# Patient Record
Sex: Female | Born: 1945 | Race: Black or African American | Hispanic: No | Marital: Married | Smoking: Current some day smoker
Health system: Southern US, Community
[De-identification: ages and names within clinical notes are randomized; demographics above are authoritative.]

## PROBLEM LIST (undated history)

## (undated) DIAGNOSIS — I219 Acute myocardial infarction, unspecified: Secondary | ICD-10-CM

## (undated) DIAGNOSIS — M1990 Unspecified osteoarthritis, unspecified site: Secondary | ICD-10-CM

## (undated) DIAGNOSIS — E119 Type 2 diabetes mellitus without complications: Secondary | ICD-10-CM

## (undated) DIAGNOSIS — E7800 Pure hypercholesterolemia, unspecified: Secondary | ICD-10-CM

## (undated) DIAGNOSIS — Z9289 Personal history of other medical treatment: Secondary | ICD-10-CM

## (undated) DIAGNOSIS — H269 Unspecified cataract: Secondary | ICD-10-CM

## (undated) DIAGNOSIS — I499 Cardiac arrhythmia, unspecified: Secondary | ICD-10-CM

## (undated) DIAGNOSIS — E559 Vitamin D deficiency, unspecified: Secondary | ICD-10-CM

## (undated) DIAGNOSIS — I2510 Atherosclerotic heart disease of native coronary artery without angina pectoris: Secondary | ICD-10-CM

## (undated) DIAGNOSIS — I480 Paroxysmal atrial fibrillation: Secondary | ICD-10-CM

## (undated) DIAGNOSIS — I639 Cerebral infarction, unspecified: Secondary | ICD-10-CM

## (undated) DIAGNOSIS — I10 Essential (primary) hypertension: Secondary | ICD-10-CM

## (undated) HISTORY — PX: MULTIPLE TOOTH EXTRACTIONS: SHX2053

## (undated) HISTORY — DX: Vitamin D deficiency, unspecified: E55.9

## (undated) HISTORY — DX: Unspecified osteoarthritis, unspecified site: M19.90

## (undated) HISTORY — PX: CATARACT EXTRACTION, BILATERAL: SHX1313

## (undated) HISTORY — DX: Atherosclerotic heart disease of native coronary artery without angina pectoris: I25.10

## (undated) HISTORY — PX: COLONOSCOPY: SHX174

## (undated) HISTORY — PX: EYE SURGERY: SHX253

## (undated) HISTORY — DX: Unspecified cataract: H26.9

## (undated) HISTORY — PX: HAMMER TOE SURGERY: SHX385

## (undated) HISTORY — PX: ABDOMINAL HYSTERECTOMY: SHX81

## (undated) HISTORY — PX: CHOLECYSTECTOMY: SHX55

## (undated) HISTORY — DX: Paroxysmal atrial fibrillation: I48.0

## (undated) HISTORY — DX: Personal history of other medical treatment: Z92.89

---

## 1996-04-15 DIAGNOSIS — I639 Cerebral infarction, unspecified: Secondary | ICD-10-CM

## 1996-04-15 HISTORY — DX: Cerebral infarction, unspecified: I63.9

## 1997-08-05 ENCOUNTER — Ambulatory Visit (HOSPITAL_COMMUNITY): Admission: RE | Admit: 1997-08-05 | Discharge: 1997-08-05 | Payer: Self-pay | Admitting: Neurology

## 1997-09-08 ENCOUNTER — Ambulatory Visit (HOSPITAL_COMMUNITY): Admission: RE | Admit: 1997-09-08 | Discharge: 1997-09-08 | Payer: Self-pay | Admitting: Neurology

## 1998-07-31 ENCOUNTER — Other Ambulatory Visit: Admission: RE | Admit: 1998-07-31 | Discharge: 1998-07-31 | Payer: Self-pay | Admitting: Obstetrics and Gynecology

## 1999-09-28 ENCOUNTER — Other Ambulatory Visit: Admission: RE | Admit: 1999-09-28 | Discharge: 1999-09-28 | Payer: Self-pay | Admitting: Obstetrics and Gynecology

## 1999-11-14 DIAGNOSIS — I2510 Atherosclerotic heart disease of native coronary artery without angina pectoris: Secondary | ICD-10-CM

## 1999-11-14 HISTORY — PX: CORONARY ANGIOPLASTY WITH STENT PLACEMENT: SHX49

## 1999-11-14 HISTORY — DX: Atherosclerotic heart disease of native coronary artery without angina pectoris: I25.10

## 1999-11-29 ENCOUNTER — Encounter: Payer: Self-pay | Admitting: Internal Medicine

## 1999-11-29 ENCOUNTER — Inpatient Hospital Stay (HOSPITAL_COMMUNITY): Admission: EM | Admit: 1999-11-29 | Discharge: 1999-12-03 | Payer: Self-pay | Admitting: *Deleted

## 1999-11-30 ENCOUNTER — Encounter: Payer: Self-pay | Admitting: Internal Medicine

## 2000-07-28 ENCOUNTER — Inpatient Hospital Stay (HOSPITAL_COMMUNITY): Admission: EM | Admit: 2000-07-28 | Discharge: 2000-07-29 | Payer: Self-pay | Admitting: Emergency Medicine

## 2000-07-28 ENCOUNTER — Encounter: Payer: Self-pay | Admitting: Emergency Medicine

## 2000-07-28 ENCOUNTER — Encounter: Payer: Self-pay | Admitting: Cardiovascular Disease

## 2004-08-13 HISTORY — PX: CORONARY ANGIOGRAM: SHX5786

## 2004-08-20 ENCOUNTER — Inpatient Hospital Stay (HOSPITAL_COMMUNITY): Admission: EM | Admit: 2004-08-20 | Discharge: 2004-08-23 | Payer: Self-pay | Admitting: Emergency Medicine

## 2004-08-20 IMAGING — CR DG CHEST 1V PORT
1 series · 1 of 1 positions shown · non-contrast
Comparison: None.

CLINICAL DATA: Chest pain and dyspnea.
 PORTABLE CHEST -1 VIEW, [DATE], [IW] HOURS:

[view not recorded]
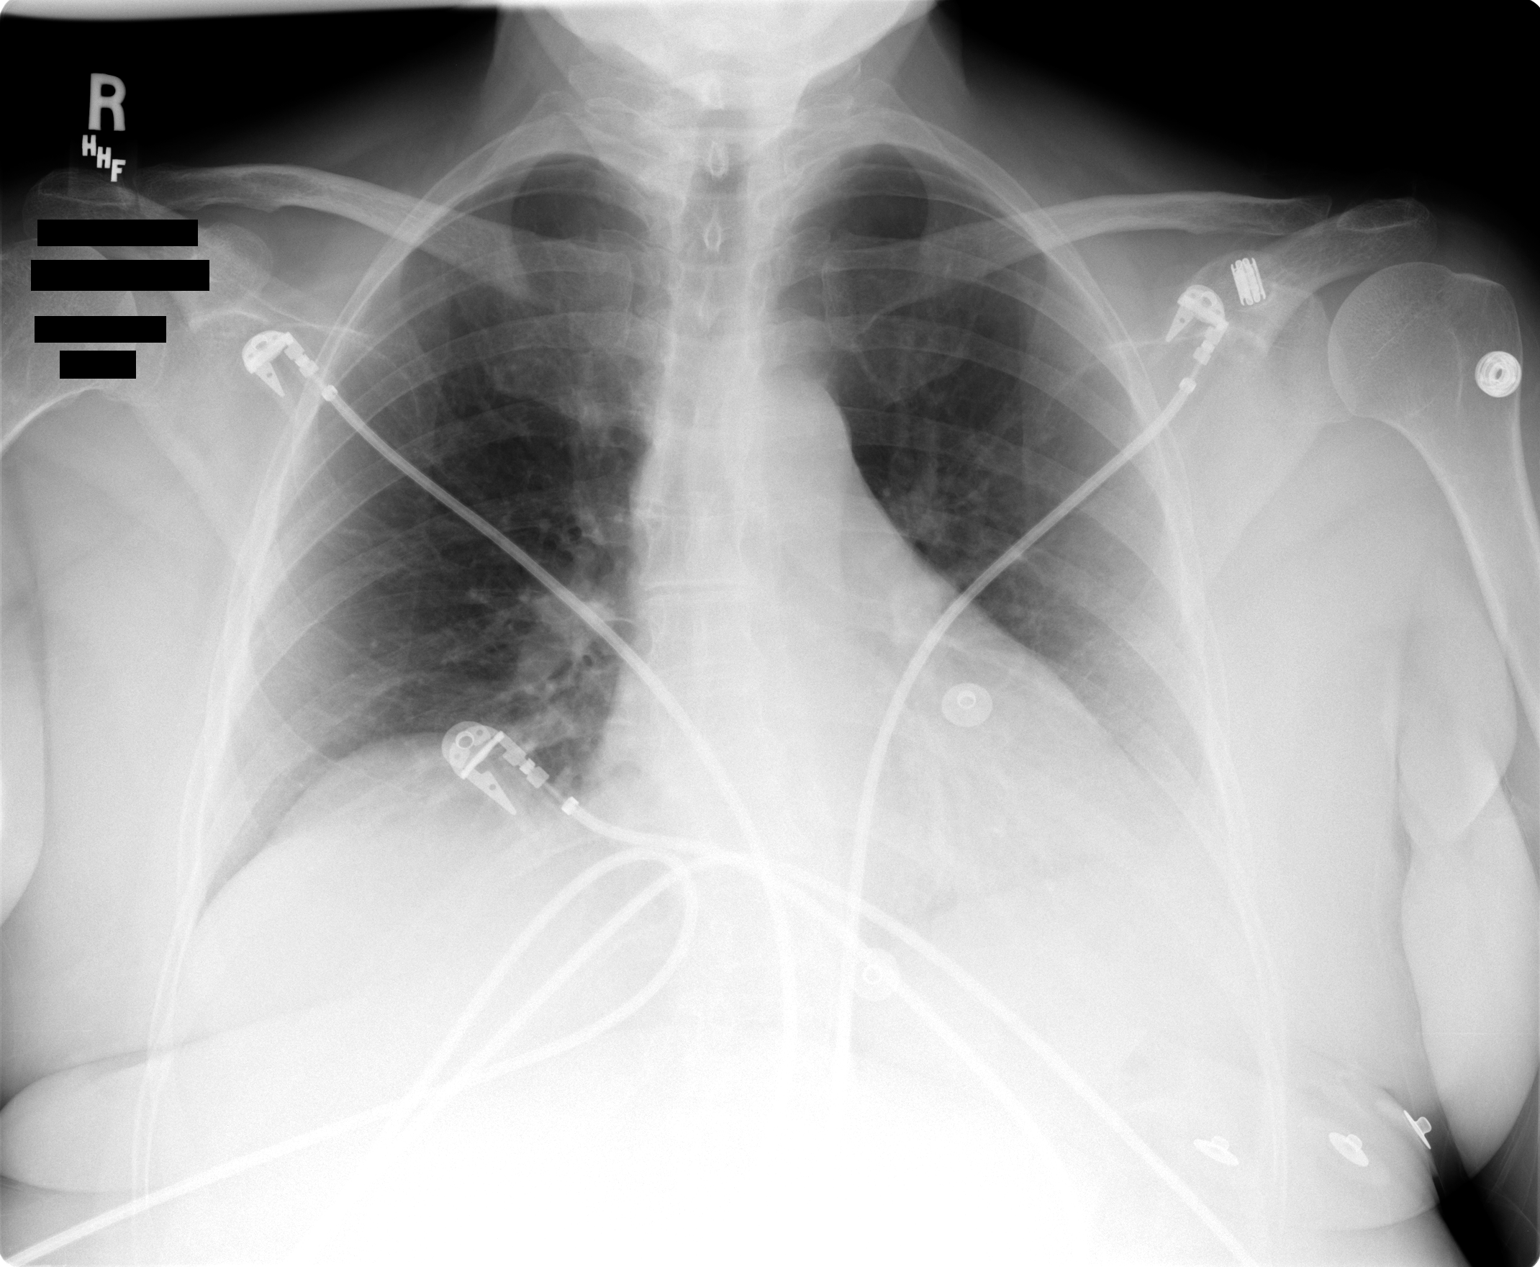

[1 of 1 positions shown; findings below may reference images not displayed]

The heart size is at the upper limits of normal for portable technique.  The lungs are clear.  There is no pleural effusion or pneumothorax.  There is a mild biconvex thoracolumbar scoliosis.
IMPRESSION: No active cardiopulmonary process demonstrated.

## 2004-08-20 IMAGING — US US ABDOMEN COMPLETE
1 series · 14 of 25 positions shown · non-contrast
Comparison: none

CLINICAL DATA: Chest discomfort.  
 ULTRASOUND ABDOMEN COMPLETE: 
 Multiple longitudinal and transverse images are obtained.  Gallstones are present with sludge within the gallbladder.  There is slight gallbladder wall thickening measuring 3.3 mm. The common bile duct was normal in size measuring 4.3 mm.  Liver is normal in size and echotexture without masses or biliary ductal dilatation.  The IVC is normal.  The pancreas is normal in the body and head region and the tail was obscured by gas however.   Spleen is normal to slightly small with a length of 7.8 cm.  The kidneys are normal in size and echotexture without masses, hydronephrosis, or calcifications.  The right kidney measures 10.1 cm, the left kidney measures 10.0 cm.  The abdominal aorta showed a normal caliber without aneurysmal dilatation measuring 2.1 cm.   There was no sonographic Murphy?s sign present.

[Series 1: unknown · 0.33mm/px · 14 of 55 slices shown]
[im 1/55]
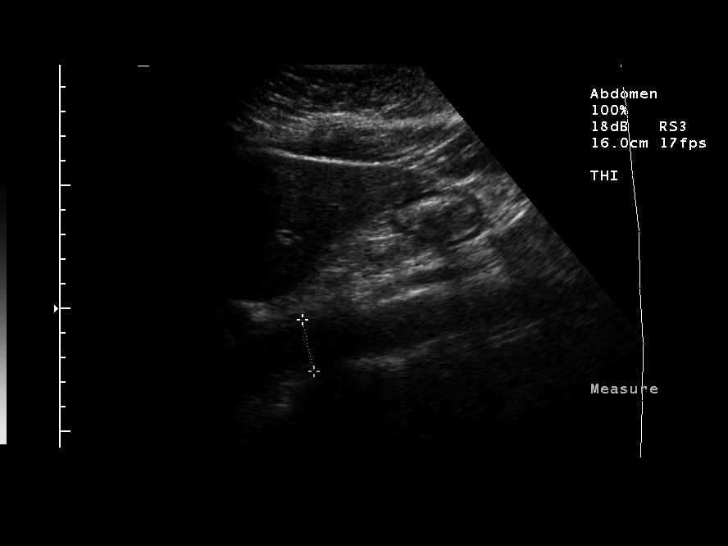
[im 5/55]
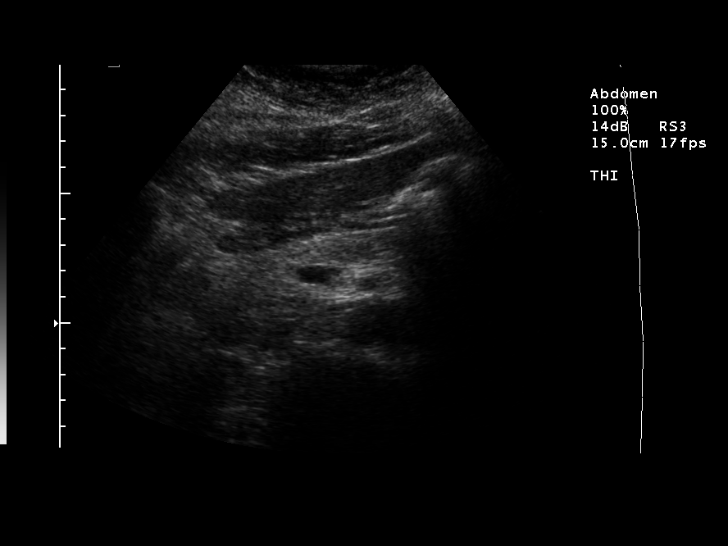
[im 10/55]
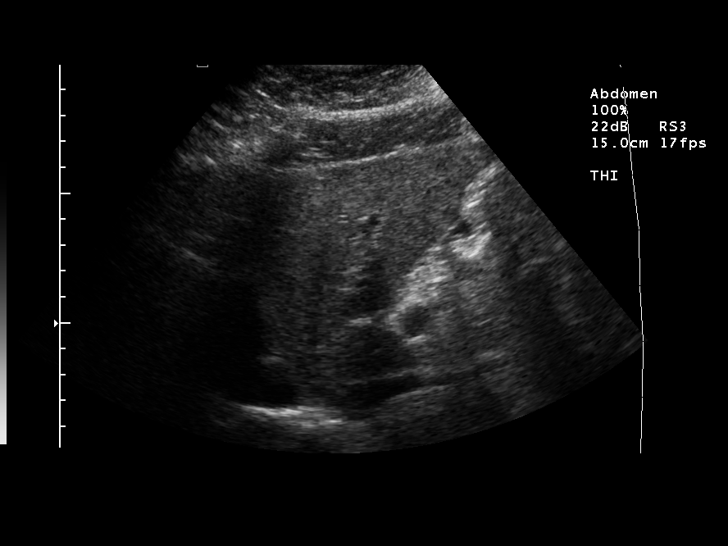
[im 14/55]
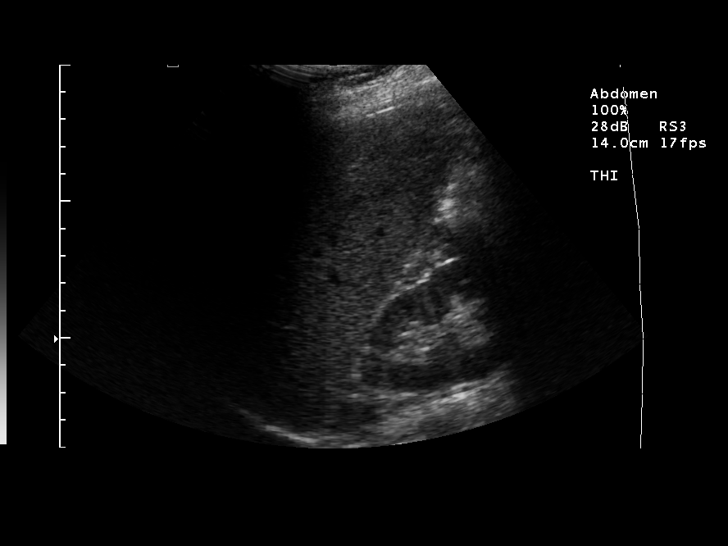
[im 19/55]
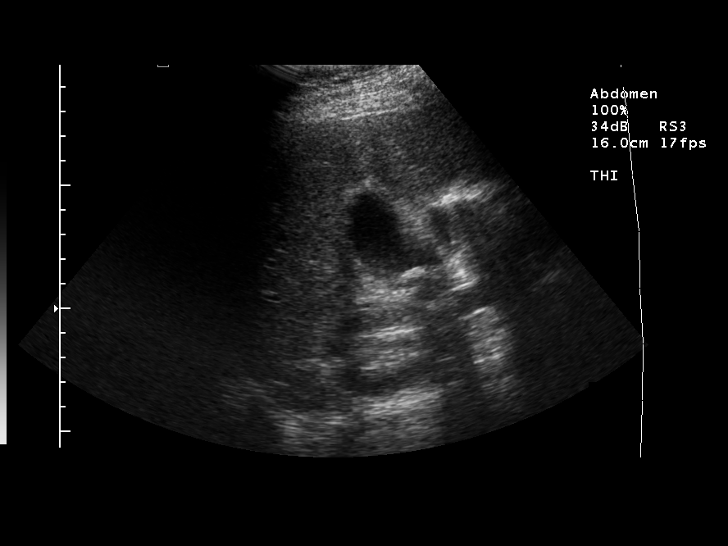
[im 21/55]
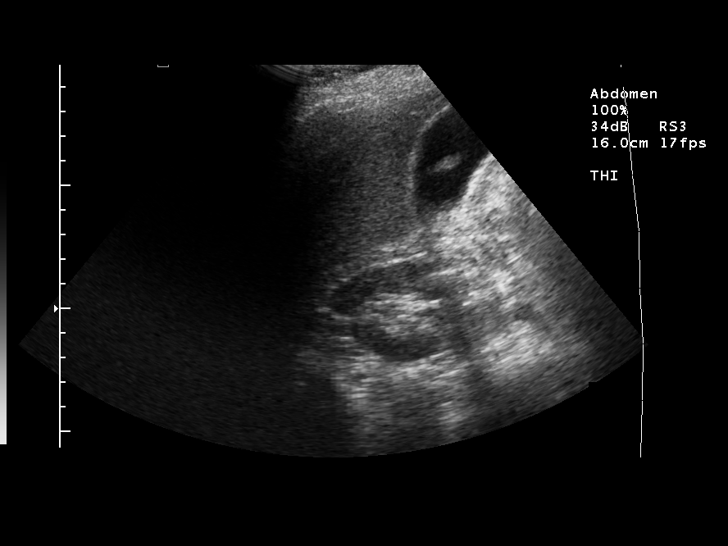
[im 25/55]
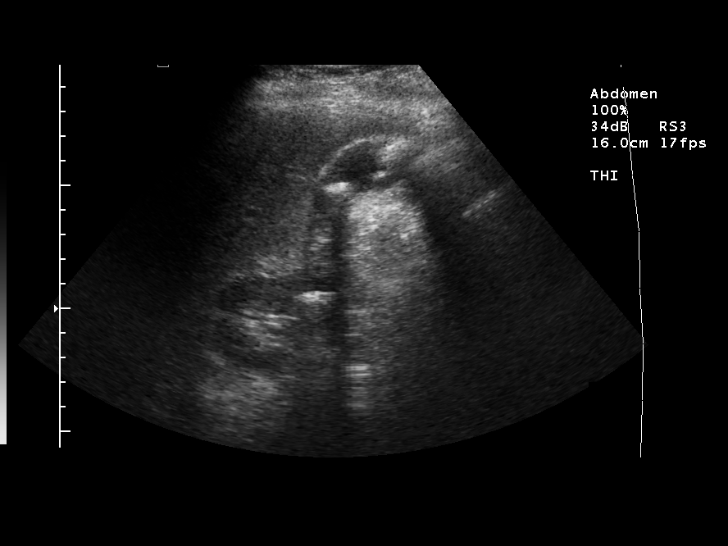
[im 30/55]
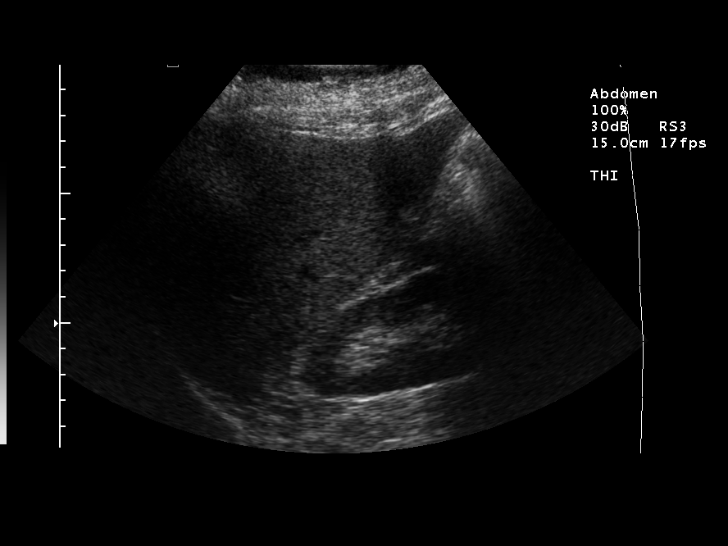
[im 34/55]
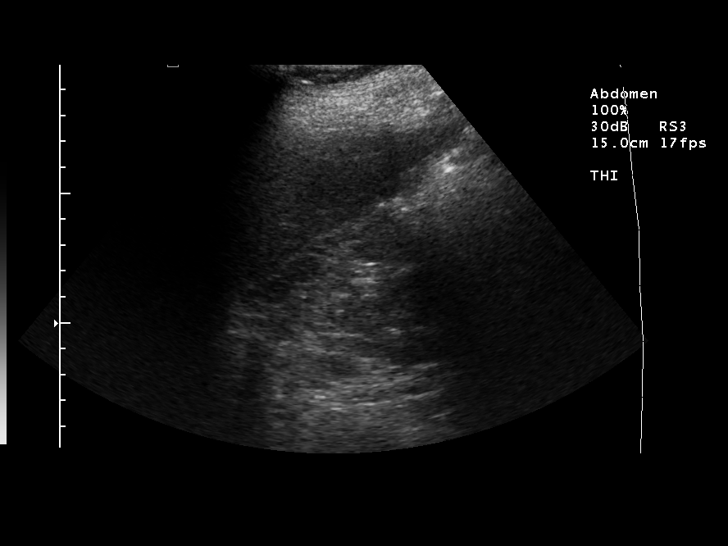
[im 37/55]
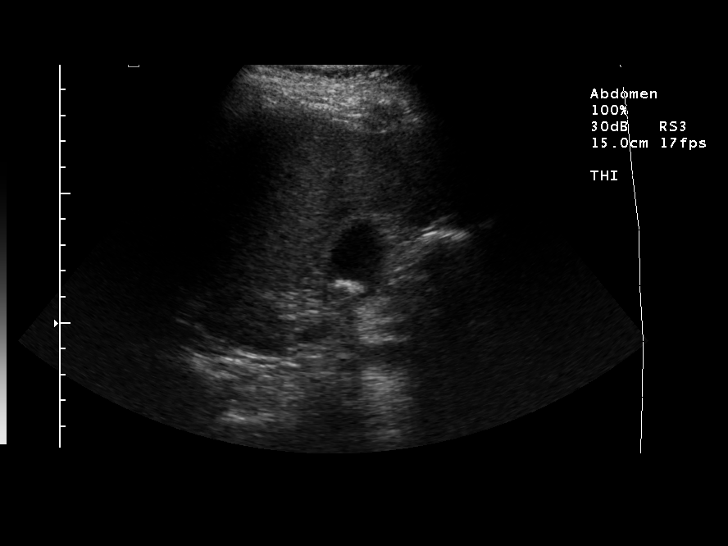
[im 41/55]
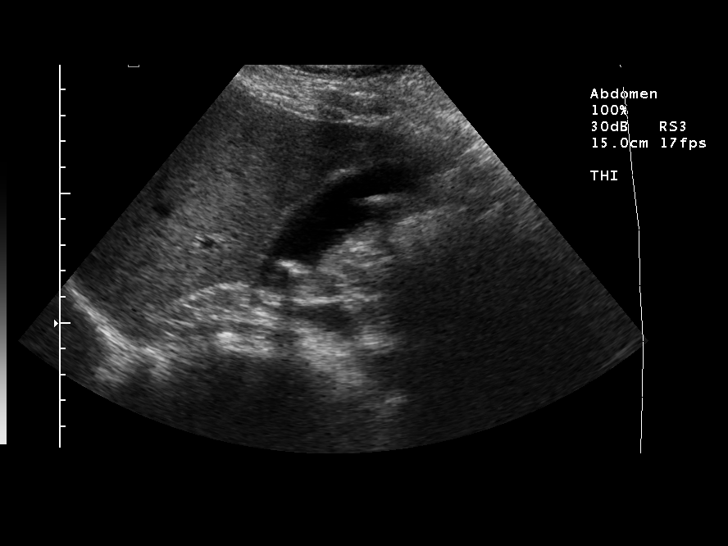
[im 46/55]
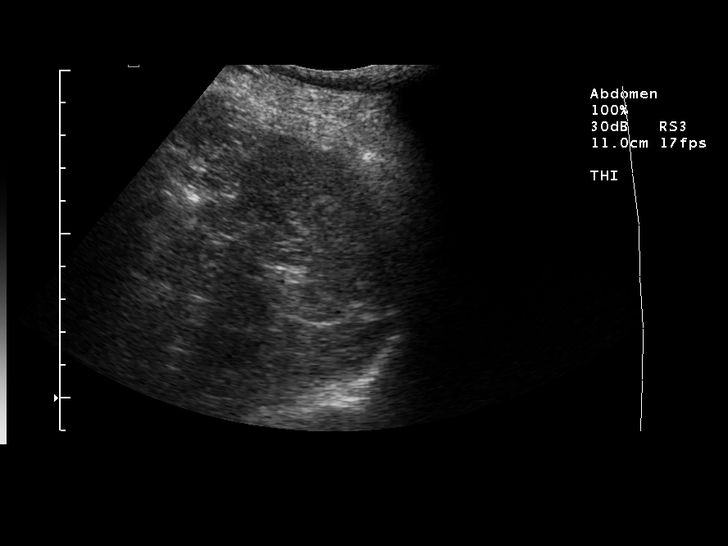
[im 50/55]
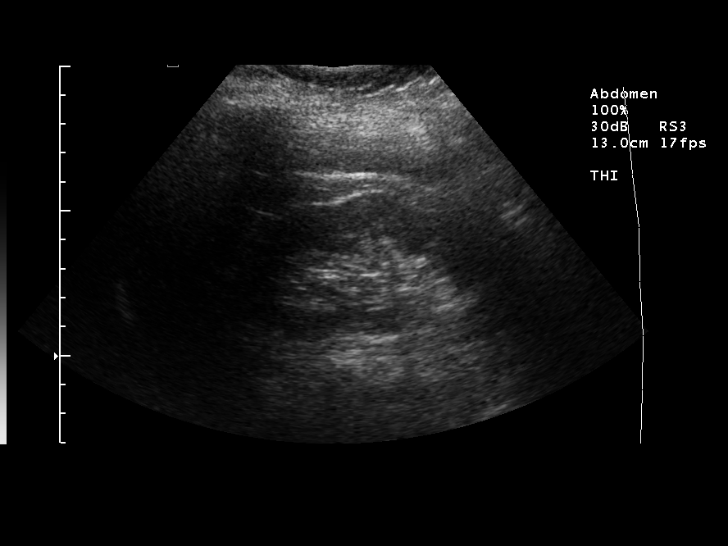
[im 55/55]
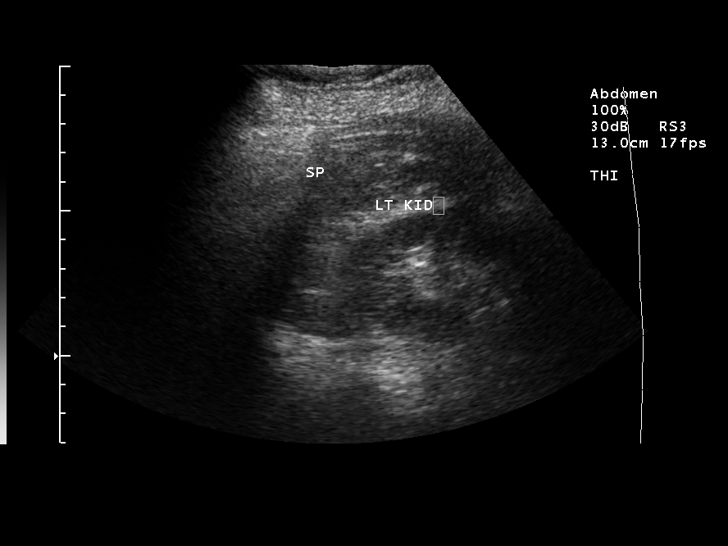

[14 of 25 positions shown; findings below may reference images not displayed]

IMPRESSION: 1.   Abnormal gallbladder containing stones and sludge. 
 2.   Mild gallbladder wall thickening. 
 3.  No biliary ductal dilatation or significant right upper quadrant tenderness.

## 2004-08-22 ENCOUNTER — Encounter (INDEPENDENT_AMBULATORY_CARE_PROVIDER_SITE_OTHER): Payer: Self-pay | Admitting: Specialist

## 2004-08-22 IMAGING — RF DG CHOLANGIOGRAM OPERATIVE
1 series · 4 of 4 positions shown · non-contrast
Comparison: none

CLINICAL DATA: Gallstones.
 OPERATIVE CHOLANGIOGRAM:
 Multiple views for an operative cholangiogram performed through the cystic duct reveal good opacification of the common hepatic duct and common bile duct.  There are no filling defects or retained stones.  There is good excretion into the duodenum.

[Series 1: run · 4 of 69 frames shown]
[frame 11/69]
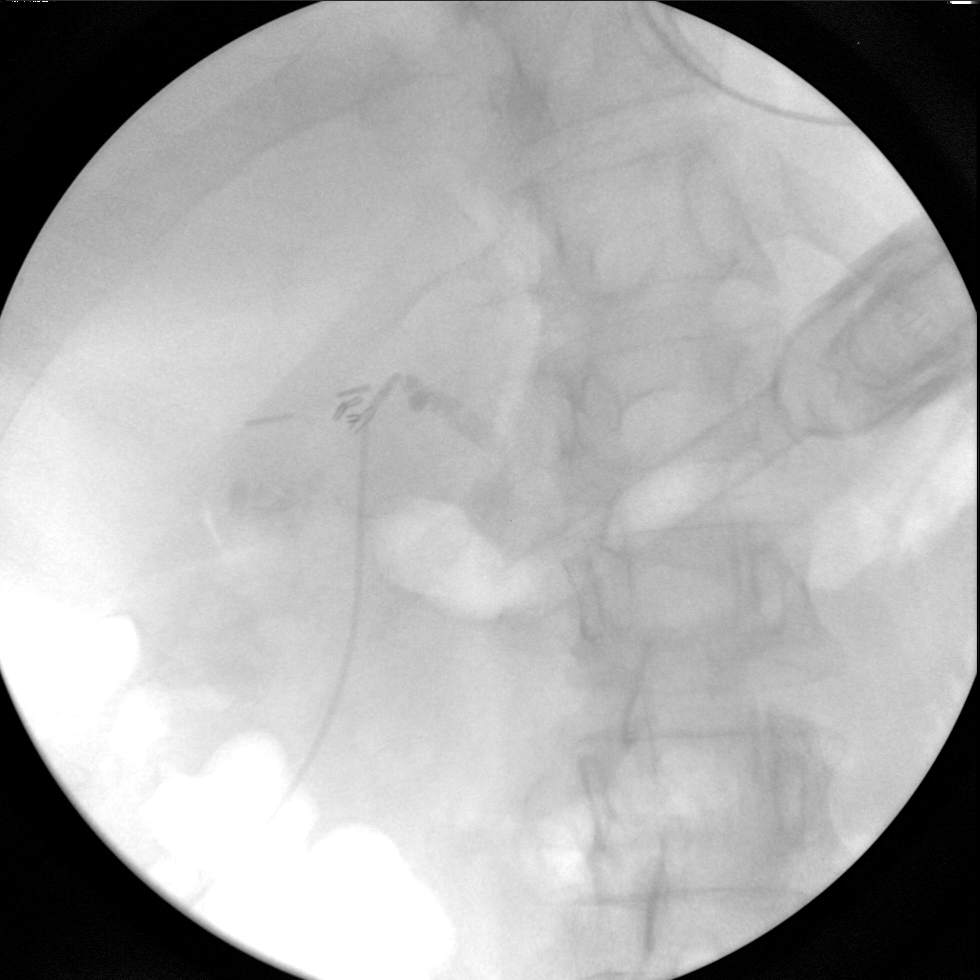
[frame 35/69]
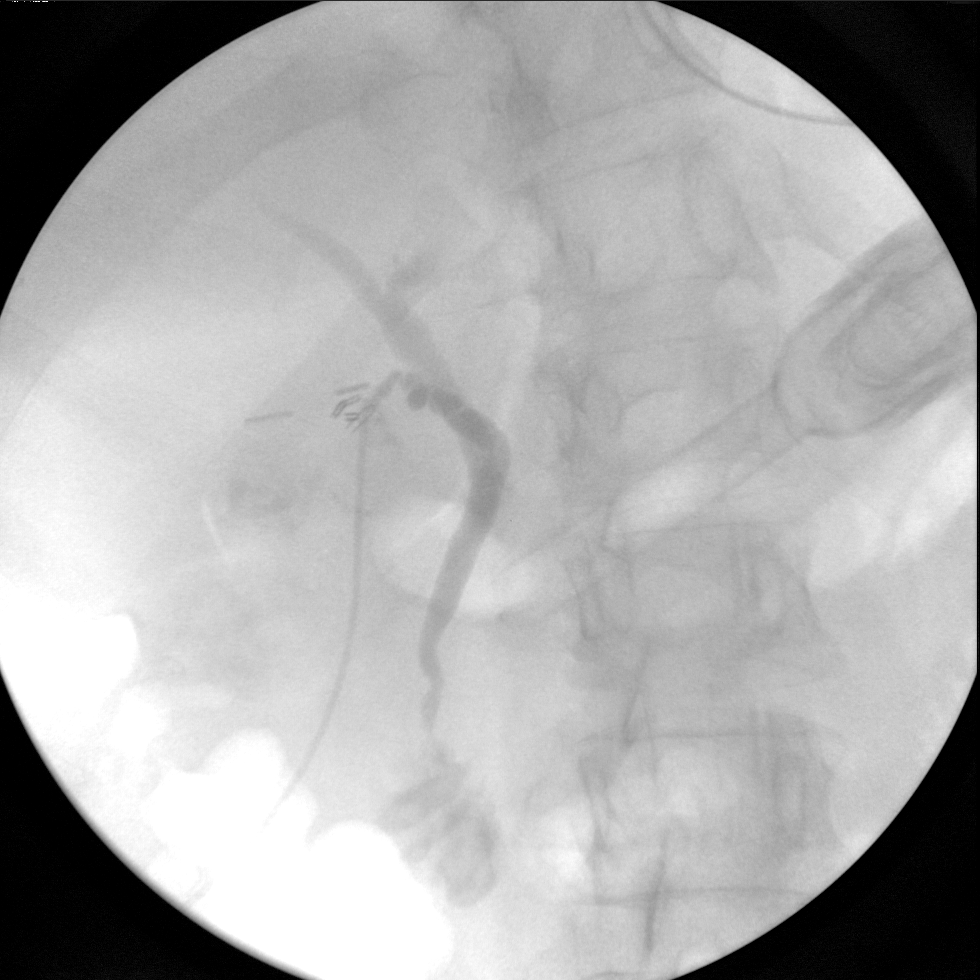
[frame 59/69]
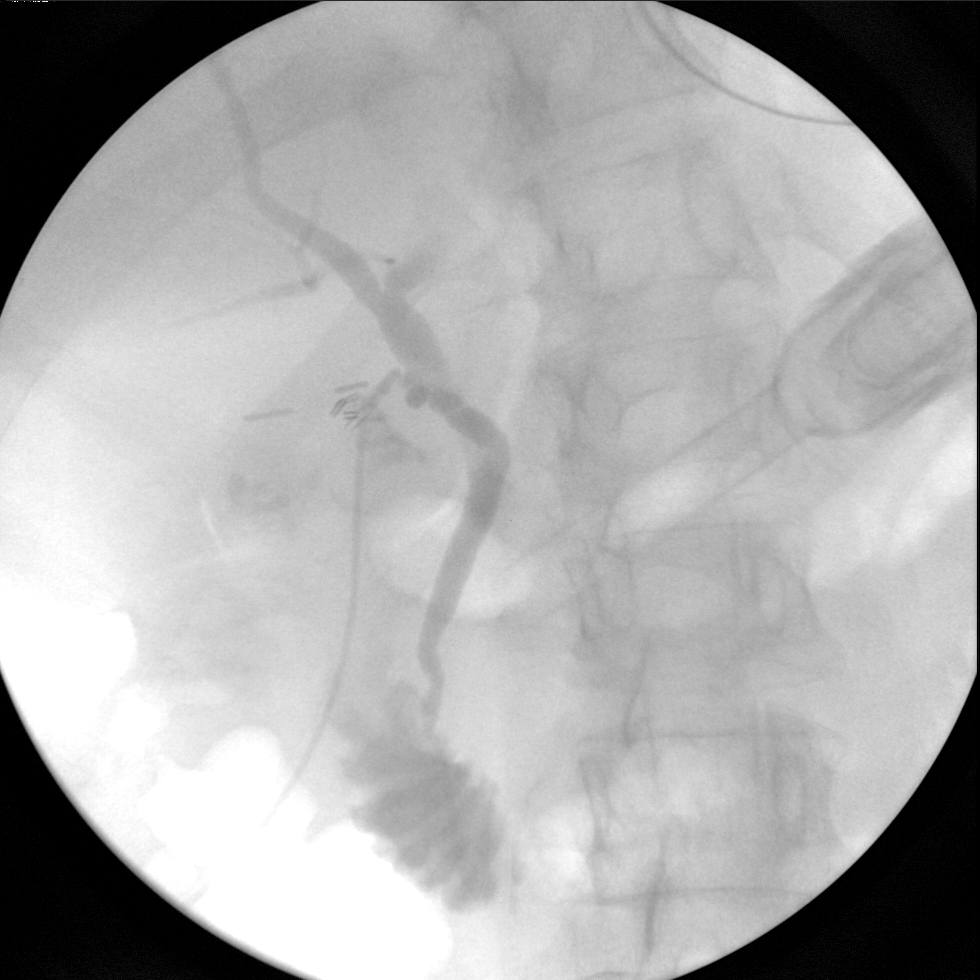
[frame 69/69]
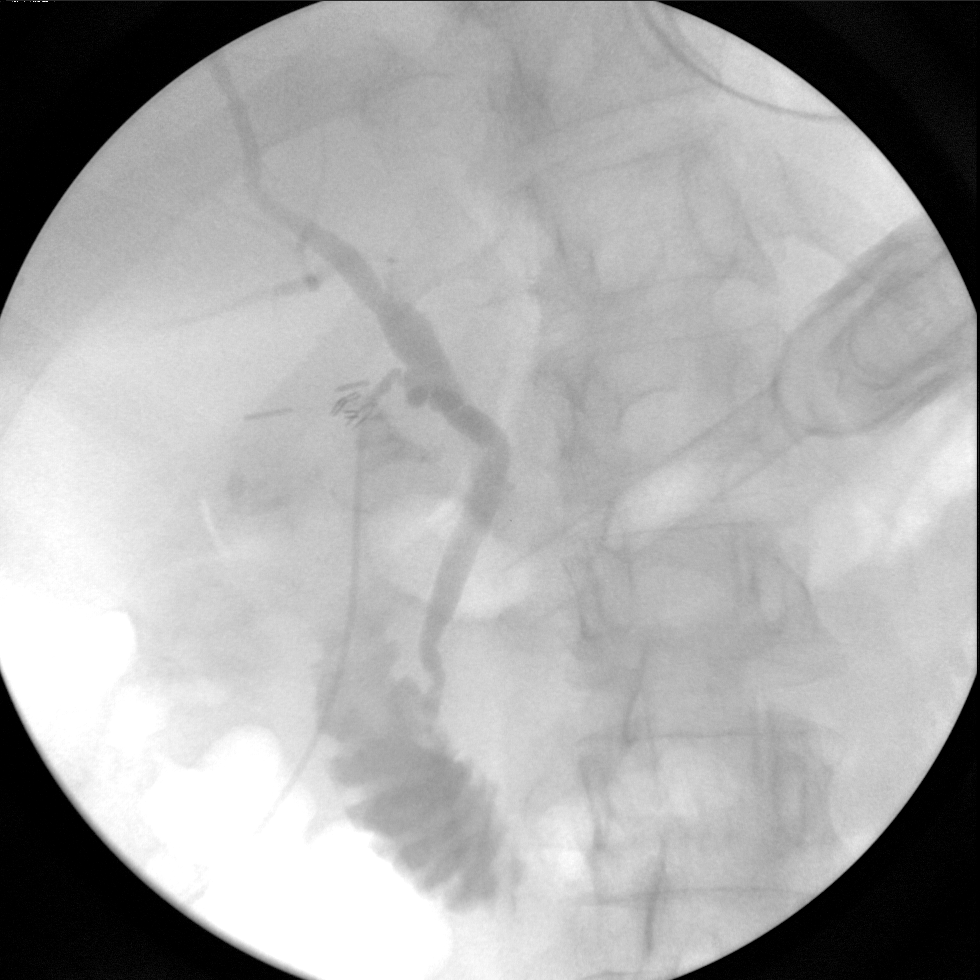

[4 of 4 positions shown; findings below may reference images not displayed]

IMPRESSION: Negative operative cholangiogram.

## 2006-12-08 ENCOUNTER — Ambulatory Visit: Payer: Self-pay | Admitting: Gastroenterology

## 2006-12-22 ENCOUNTER — Ambulatory Visit: Payer: Self-pay | Admitting: Gastroenterology

## 2010-08-31 NOTE — Consult Note (Signed)
Debbie Debbie [name], Debbie [name]                  ACCOUNT NO.:  [id number]   MEDICAL RECORD NO.:  [id number]          PATIENT TYPE:  INP   LOCATION:  2011                         FACILITY:  [facility]   PHYSICIAN:  Sharlet Salina T. Hoxworth, M.D.DATE OF BIRTH:  1946/03/08   DATE OF CONSULTATION:  08/21/2004  DATE OF DISCHARGE:                                   CONSULTATION   REASON FOR CONSULTATION:  Cholecystitis.   HISTORY OF PRESENT ILLNESS:  Debbie Debbie [name] is a 65 year old female who developed  the acute onset of epigastric abdominal pain not associated with nausea and  vomiting on Aug 19, 2004.  She stated that the pain mimicked her previous MI  pain and she proceeded to the [facility] emergency room  on Aug 20, 2004.  She was  admitted by cardiology and ultimately received cardiac catheterization on  Aug 21, 2004, which showed no restenosis.  As part of her workup, she  received a gallbladder ultrasound and this did reveal cholelithiasis with  gallbladder wall thickening.  We are being consulted for a cholecystectomy.   REVIEW OF SYMPTOMS:  As above.  She denies any shortness of breath.  All  other systems are negative.   ALLERGIES:  No known drug allergies.   MEDICATIONS:  Aspirin, Norvasc 10 mg daily, Lipitor 80 mg daily,  hydrochlorothiazide 25 mg daily, Protonix 40 mg, Altace 10 mg daily.   PAST MEDICAL HISTORY:  Coronary artery disease, stroke, hyperlipidemia.   PAST SURGICAL HISTORY:  Tubal ligation.   FAMILY HISTORY:  Mother deceased secondary to ovarian cancer.  Sister with a  history of CHF.   SOCIAL HISTORY:  She smokes less than half pack per day. No alcohol or  illicit drug use.  She lives in [place] with her husband.  She has three  children.   PHYSICAL EXAMINATION:  VITAL SIGNS:  Blood pressure 113/49, pulse 54, respirations 20, temperature  97.9, oxygen saturation 98% on room air.  GENERAL:  In no acute distress.  HEENT:  Grossly normal.  Sclerae clear, conjunctivae normal, nares  without  discharge.  NECK:  No carotid or subclavian bruits, no JVD, no thyromegaly.  HEART:  Regular rate and rhythm, no murmurs, gallops, and rubs.  CHEST:  Clear to auscultation bilaterally, no wheezing, no rhonchi.  SKIN:  Warm and dry.  ABDOMEN:  Good bowel sounds, soft, nontender, nondistended, no  hepatosplenomegaly.  NEUROLOGICAL:  Cranial nerves 2-12 grossly intact.  MUSCULOSKELETAL:  No joint effusions, no CVA tenderness.  EXTREMITIES:  No peripheral edema, warm.   ASSESSMENT AND PLAN:  1.  Cholecystitis for laparoscopic cholecystectomy with interoperative      cholangiogram on Aug 22, 2004, by Dr. Glenna Fellows.  2.  Coronary artery disease, history of myocardial infarction.  3.  History of remote stroke ten years ago.  4.  Hyperlipidemia, treated.  5.  Status post tubal ligation.   Thank you for the consult.      LB/MEDQ  D:  08/21/2004  T:  08/21/2004  Job:  [id number]   cc:   Nicki Guadalajara, M.D.  [phone],  [address]  [place], [place] [address]  Fax: [phone]   Llana Aliment. Malon Kindle., M.D.  [address]  [place]  [place] [address]  Fax: [phone]   Nanetta Batty, M.D.  Fax: [phone]   Jonita Albee, M.D.  [facility]  [address]  [place]  [place] [address]  Fax: [phone]

## 2010-08-31 NOTE — Discharge Summary (Signed)
[place]  Patient:    Debbie [name], Debbie [name]                         MRN: [id number] Adm. Date:  [id number] Disc. Date: [id number] Attending:  Madaline Guthrie Dictator:   Blanch Media, M.D.                           Discharge Summary  PRIMARY CARE: Dr. Perrin Maltese.  CONSULTATIONS:  [facility].  DISCHARGE DIAGNOSES: 1. Status post acute inferior myocardial infarction. 2. Status post cardiac catheterization on November 30, 1999 with a complex    percutaneous transluminal coronary angioplasty/stent of the right coronary    artery. 3. History of cerebrovascular accident. 4. Hypertension. 5. Hyperlipidemia.  DISCHARGE MEDICATIONS: 1. Toprol XL 100 mg 2 p.o. b.i.d. 2. Plavix 75 mg 1 p.o. q.d. with meal. 3. Altace 2.5 mg 1 p.o. q.d. 4. Aspirin 325 mg 1 p.o. q.d. 5. Zocor 20 mg 1 p.o. q.d.  PROCEDURES:  Cardiac catheterization/complex PTCA with stent placement x 3 in the RCA.  This was performed via the right femoral artery.  ADMITTING HISTORY AND PHYSICAL:  This is a 65 year old African-American female with no significant past cardiac history.  She developed chest tightness while at work on the morning of August 16 at 10 a.m.  She was sitting at the time with no strenuous exercise.  The patient described her discomfort as a crushing pain the in anterior chest.  There was no associated shortness of breath nor radiation into the back, arm, or jaw.  The patient did have nausea and diaphoresis and vomiting x 1.  The pain did not resolve, so the patient came to the ER at 11:20.  She received 2 sublingual nitroglycerin with partial relief of the pain.  On our evaluation, she still had some chest discomfort and was started on a nitroglycerin and heparin drip.  About 45 minutes after the nitroglycerin drip was started, she had complete resolution of her chest pain.  Physical examination showed vital signs with blood pressure 150/62, pulse 58, respiratory  rate 20, O2 saturation 100% on room air.  The rest of the physical exam was noncontributory.  LABORATORY DATA:  EKG showed a heart rate of 66 with sinus bradycardic rhythm.  Admitting labs showed a hemoglobin 12.8, hematocrit 36.2, white blood cell count 11.1.  PT 12.8, INR 1.0, PTT 22.  Initial cardiac enzymes: CK total 115, CK-MB 1.9, troponin I less than 0.03.  HOSPITAL COURSE:  The patient was admitted to telemetry to rule out an acute MI.  The nitroglycerin and heparin drips were continued.  We obtained serial EKGs and CK-MBs.  A CT of the chest was obtained to rule out a pulmonary embolus as we were worried about this as the cause of the chest pain.  She had been on Coumadin following her CVA but was subtherapeutic.  Spiral CT revealed no abnormalities.  She was also started on a beta blocker, metoprolol, a proton pump inhibitor, Protonix.  On hospital day #1, her second set of cardiac enzymes were positive.  Her CK total had increased to 596, her CK-MB to 103.2, and her troponin I to 1.17. Her EKG showed sinus bradycardia continuing.  There were T wave inversions in leads II, III, V4, V5, and V6.  There were Q waves in leads III and aVF. Cardiology was consulted and saw her that day.  Their plan  was to continue with IV heparinization, begin Aggrastat, continue nitroglycerin drip, and they added Plavix p.o. and checked fasting lipids.  She was taken to the catheterization lab on August 17.  She had cardiac catheterization with a complex PTCA with three stent placements in the RCA.  On hospital day #2, she had no further chest pain and no groin pain from the cardiac catheterization.  The plan was to hospitalize her until hospital day #4 after her MI.  Her potassium was found to be 2.9, and she received oral replacement.  The metoprolol was discontinued, and she was switched to metoprolol XL 100 p.o. q.d.  Cardiac rehabilitation was consulted.  On hospital day #3, potassium  continued to be low at 3.1, and oral replacement was continued.  The patient was to discharge her the following morning.  On hospital day #4, the patient continued to have no chest pain even on exertion.  The plan was to continue the metoprolol, ACE inhibitor, and Plavix. On hospital day #4, potassium was corrected, and plans for discharge were finalized.  DISPOSITION:  The patient was discharged to home.  She is to follow up with her primary care physician, Dr. Perrin Maltese, in two weeks.  She is to have no strenuous activities for two weeks but may started to increase her activities between weeks two and four post discharge.  She is to be off work for up to four weeks with a minimum of two weeks.  A low-cholesterol, low-fat diet was recommended.  Cardiac rehabilitation will follow up with the patient as an outpatient. DD:  12/03/99 TD:  12/04/99 Job: [id number]

## 2010-08-31 NOTE — H&P (Signed)
Debbie [name], OLTMANN                  ACCOUNT NO.:  [id number]   MEDICAL RECORD NO.:  [id number]          PATIENT TYPE:  INP   LOCATION:  1829                         FACILITY:  [facility]   PHYSICIAN:  Nanetta Batty, M.D.   DATE OF BIRTH:  08-25-1945   DATE OF ADMISSION:  08/20/2004  DATE OF DISCHARGE:                                HISTORY & PHYSICAL   CHIEF COMPLAINT:  Chest pain.   HISTORY OF PRESENT ILLNESS:  Mrs. Gaut is a 65 year old female followed by  Dr. Tresa Endo with a history of coronary artery disease.  She had a three-site  RCA stent placement in August 2001 in the setting of a DMI.  She had some  residual disease in the circumflex and LAD at that time.  She had a relook  catheterization in April 2002, that showed patent RCA stage and progression  of the residual coronary disease in the LAD and circumflex.  She had  Cardiolite study in March of 2005 that was negative for ischemia.  She had  normal left ventricular function then.  Yesterday afternoon she sitting on a  couch and developed some epigastric discomfort which she described as  indigestion.  She had no associated nausea, vomiting, diaphoresis, or  radiation the her neck or arms.  Symptoms worsened about 1:30 a.m. and she  took two Pepto-Bismols without relief.  She did not take nitroglycerin.  She  awakened this morning and continued to have some epigastric discomfort and  called our office. She is seen now in the emergency room at [facility].  She is  currently pain-free after receiving four aspirins in the emergency room.  The symptoms she is having now are similar to her preangioplasty symptoms as  well as her precatheterization symptoms in April 2002.   PAST MEDICAL HISTORY:  1.  Unremarkable for diabetes.  2.  She does have treated hypertension.  3.  Hyperlipidemia.  4.  She has had no other major surgeries.   CURRENT MEDICATIONS:  1.  Toprol-XL 150 mg a day.  2.  Aspirin daily.  3.  Altace 10 mg a day.  4.   Caduet 10/80 daily.  5.  Hydrochlorothiazide 25 mg a day.   ALLERGIES:  No known drug allergies.   SOCIAL HISTORY:  She is married.  She has two children and three  grandchildren.  She is a Teaching laboratory technician at [employer].  She smokes less than  half a pack a day.  She does not exercise regularly.   FAMILY HISTORY:  Remarkable for coronary artery disease.  Apparently she has  a sister who died suddenly of an MI who had diabetes.   REVIEW OF SYSTEMS:  She denies any nausea and vomiting.  She has not had  unusual dyspnea on exertion.  She has not had palpitations or tachycardia.   PHYSICAL EXAMINATION:  VITAL SIGNS:  Blood pressure 104/54, pulse 48,  respirations 12.  GENERAL:  Well-developed, well-nourished African American female in no acute  distress.  HEENT:  Normocephalic.  Extraocular movements intact.  Sclerae nonicteric.  Lids and conjunctivae within normal limits.  NECK:  Without JVD and without bruit.  CHEST:  Clear to auscultation and percussion.  CARDIAC:  Regular rate and rhythm without obvious murmur, rub, or gallop.  Normal S1 and S2.  ABDOMEN:  Obese, nontender.  Bowel sounds are present.  EXTREMITIES:  Without edema.  Distal pulses are intact.  NEUROLOGIC:  Grossly intact.  She is awake, alert, oriented, cooperative.  Moves all extremities without __________.   LABORATORY DATA:  Her EKG shows sinus bradycardia without acute changes.  Sodium 138, potassium 3.6, BUN 12, creatinine 0.9.  White count 8.6,  hemoglobin 12.8, hematocrit 36.9, platelets 345, INR 0.9, troponins negative  x2.  Chest x-ray was negative for acute disease.   IMPRESSION:  1.  Chest and epigastric discomfort, rule out coronary artery disease      progression.  2.  Known coronary disease with right coronary artery stenting in August,      2001 in the setting of an myocardial infarction, similar symptoms at      that presentation.  3.  Treated hypertension.  4.  Dyslipidemia.  5.  Smoking.  6.   Family history of coronary artery disease.   PLAN:  The patient seen by Dr. Allyson Sabal and myself today in the emergency room.  She will be admitted to telemetry.  Rule out for an MI and start on IV  heparin and nitrates.  Will go ahead and get an gallbladder ultrasound.  She  will probably need restudy this admission.      LKK/MEDQ  D:  08/20/2004  T:  08/20/2004  Job:  [id number]

## 2010-08-31 NOTE — Cardiovascular Report (Signed)
[place]  Patient:    Debbie [name], Debbie [name]                         MRN: [id number] Proc. Date: 07/29/00 Adm. Date:  [id number] Disc. Date: [id number] Attending:  Virgina Evener CC:         Cardiac Catheterization Lab  Lennette Bihari, M.D.   Cardiac Catheterization  PROCEDURES PERFORMED: 1. Left selective coronary arteriography by Judkins technique. 2. Retrograde left heart catheterization. 3. Left ventricular angiography. 4. Right femoral Perclose arteriotomy.  CARDIOLOGIST:  Madaline Savage, M.D.  COMPLICATIONS:  None.  ENTRY SITE:  Right femoral.  DYE USED:  Omnipaque.  MEDICATIONS GIVEN:  Fentanyl 25 mg on two occasions for sedation.  PATIENT PROFILE:  The patient is a 65 year old African-American female who is seen by Dr. Daphene Jaeger for cardiology who entered the hospital several days ago with an episode of chest pain.  She has had previous coronary artery stenting to three sites in the right coronary artery on November 30, 1999.  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  Cardiac enzymes and EKGs have been negative for ischemia.  This cardiac catheterization is done electively on an inpatient basis.  RESULTS:  Left ventricular ejection fraction 66% calculated.  Left main coronary artery normal.  Left anterior descending coronary artery courses to cardiac apex.  The mid LAD contains a 50% fixed stenosis with a systolic bridge.  The distal vessel is free of any significant obstructive lesions.  The circumflex coronary artery gives rise to one bifurcating obtuse marginal branch which arises proximally in the vessel.  The remainder of the circumflex itself is nondominant and terminates as a small posterolateral branch.  No significant lesions are seen in either of the circumflex branches.  The right coronary artery contains radiopaque stents proximally in the mid portion and in the beginning of the distal portion of the vessel.   The vessel then bifurcates into a posterior descending and a posterolateral branch and is a dominant vessel.  All three stents are patent.  The vessel is 3.5 in diameter proximally and is 2.5 in diameter distally, and all stents appear to be patent.  The distal runoff is good.  FINAL DIAGNOSES: 1. Normal left ventricular systolic function. 2. Right coronary artery disease status post stenting with patent stents. 3. 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  PLAN:  Continued medical therapy. DD:  07/29/00 TD:  07/30/00 Job: [id number]

## 2010-08-31 NOTE — Op Note (Signed)
Debbie [name], Debbie [name]                  ACCOUNT NO.:  [id number]   MEDICAL RECORD NO.:  [id number]          PATIENT TYPE:  INP   LOCATION:  2011                         FACILITY:  [facility]   PHYSICIAN:  Sharlet Salina T. Hoxworth, M.D.DATE OF BIRTH:  May 20, 1945   DATE OF PROCEDURE:  08/22/2004  DATE OF DISCHARGE:                                 OPERATIVE REPORT   PREOPERATIVE DIAGNOSIS:  Cholelithiasis, cholecystitis.   POSTOPERATIVE DIAGNOSIS:  Cholelithiasis, cholecystitis.   SURGICAL PROCEDURES:  Laparoscopic cholecystectomy with  intraoperative  cholangiogram.   HISTORY OF PRESENT ILLNESS:  Debbie [name] is a 66 year old female with history  of coronary disease who presented with acute severe low substernal and  epigastric pain two days ago. She has had a cardiac cath and coronary  disease has been ruled out. Her pain is improved. Gallbladder ultrasound has  shown cholelithiasis and thickened gallbladder wall. She is felt to have  symptomatic cholelithiasis. Laparoscopic cholecystectomy with cholangiogram  has been recommended and accepted. The nature of the procedure, indications,  risks of bleeding, infection, bile leak and bile duct injury were discussed  and understood. No. The patient is now brought to the operating room for  this procedure.   DESCRIPTION OF PROCEDURE:  Patient brought to the operating room and placed  in the supine position on the operating the table and general orotracheal  anesthesia induced. She had received broad spectrum preoperative  antibiotics. PAS were placed. The abdomen was widely sterilely prepped and  draped. Local anesthesia was used to infiltrate the trocar sites prior to  the incisions. A 1 cm incision was made at the umbilicus and dissection  carried down to the midline fascia which was sharply incised for 1 cm and  the peritoneum entered under direct vision. The Hasson trocar was placed  through mattress sutures with 0 Vicryl. Under direct vision a  standard four-  port technique was used. The gallbladder was visualized. It appeared  somewhat thickened and there were some chronic omental adhesions. These were  carefully taken down. There were some adhesion of the duodenum which were  also a carefully taken down protecting the duodenum. The infundibulum was  then grasped and retracted inferolaterally and fibrofatty tissues stripped  off the neck of the gallbladder toward the porta hepatis. Calot's triangle  of distal gallbladder was thoroughly dissected the cystic artery clearly  identify the cystic duct gallbladder junction dissected 360 degrees. When  the anatomy was clear, the cystic duct was clipped from the  gallbladder  junction.  Operative cholangiogram obtained through the cystic duct. This  showed good filling of normal common bile duct, intrahepatic ducts with free  flow of the duodenum and no filling defects. Following this, the  cholangiocatheter was removed and the cystic duct doubly clipped proximally  and divided. The cystic artery been previously doubly clipped proximally and  distally it was divided. The gallbladder was then dissected free from its  bed using hook cautery and removed intact through the umbilicus. Operative  site was inspected for hemostasis which appeared complete. Trocars  removed under direct vision and all CO2  was evacuated. Mattress suture was  secured to the umbilicus. Skin incisions were closed with subcuticular  Monocryl and Steri-Strips.  Sponge, needle and instrument counts were  correct.  The patient taken to the recovery room in good condition.      BTH/MEDQ  D:  08/22/2004  T:  08/22/2004  Job:  [id number]

## 2010-08-31 NOTE — Consult Note (Signed)
Debbie [name], Debbie [name]                  ACCOUNT NO.:  [id number]   MEDICAL RECORD NO.:  [id number]          PATIENT TYPE:  INP   LOCATION:  2011                         FACILITY:  [facility]   PHYSICIAN:  Llana Aliment. Malon Kindle., M.D.DATE OF BIRTH:  1946/02/03   DATE OF CONSULTATION:  08/21/2004  DATE OF DISCHARGE:                                   CONSULTATION   REFERRING PHYSICIAN:  Nicki Guadalajara, M.D.   HISTORY:  This is a 65 year old African-American female followed by Dr.  Tresa Endo with a history of coronary disease.  She had a stent placement in  August 2001 in the setting of acute MI.  A re-look catheterization in April  showed this to be patent.  She came in with chest pain that started two days  ago.  It woke her from sleep.  She really describes it as substernal  epigastric in location.  It did not radiate.  It was not associated with  nausea.  She did not feel it was like her heart, although it was in a  similar location.  This is the first time she had ever had this pain, had  never had any pain similar to this.  She was not nauseated.  She has no  chronic esophageal reflux symptoms, does not take anything for heartburn and  she never really had heartburn.   CURRENT MEDICATIONS:  Altace, hydrochlorothiazide, Toprol, Caduet 10/80 mg  daily, aspirin 325 mg daily.   She is allergic to no medicines.   PAST MEDICAL HISTORY:  1.  Coronary disease with history of previous stent placement.  2.  Hypertension.  3.  High cholesterol.   Previous surgeries include bilateral tubal ligation and placement of her  stents.  She has never had any other abdominal surgery.   FAMILY HISTORY:  Father died of TB.  Mother died of ovarian cancer.  Family  history is negative for any GI cancer.  It is notable that an aunt and a  niece both had gallstones.   SOCIAL HISTORY:  She is married, has two children, actually has  grandchildren.  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  Still smokes.  Does not  exercise  regularly.  Drinks minimally if at all.   REVIEW OF SYSTEMS:  Remarkable for lack of similar pain, total lack of any  symptoms of chronic esophageal reflux.   PHYSICAL EXAMINATION:  VITAL SIGNS:  The patient is afebrile, blood pressure  100/62, pulse 51.  GENERAL:  A pleasant African-American female in no acute distress.  HEENT:  Eyes clear, nonicteric.  Throat is normal.  NECK:  No lymphadenopathy.  CHEST:  Lungs clear anteriorly and posteriorly.  CARDIAC:  Regular rate and rhythm without murmurs or gallops.  ABDOMEN:  Soft, totally nontender to exam with normal bowel sounds.  No  masses are palpated.   PERTINENT DATA:  Liver function tests, amylase are all completely normal.  Ultrasound of the abdomen shows gallbladder with sludge and stones and  possibly a slightly thickened wall.  The common bile duct is normal.   ASSESSMENT:  1.  Chest/epigastric pain very  likely due to gallstones.  At this point she      is not having any symptoms suggestive of reflux disease chronically, and      I do not think she needs any further evaluation for reflux.  2.  Coronary artery disease, recent catheterization this admission showed      grafts widely patent, enzymes negative.  I do not think this was likely      a coronary event.   PLAN:  Will recommend a surgical consultation to discuss a possible  cholecystectomy.      JLE/MEDQ  D:  08/21/2004  T:  08/21/2004  Job:  [id number]   cc:   Nicki Guadalajara, M.D.  [phone] [address]  [place], [place] [address]  Fax: [phone]

## 2010-08-31 NOTE — Cardiovascular Report (Signed)
[name], [name] NO.:  [id number]   MEDICAL RECORD NO.:  [id number]          PATIENT TYPE:  INP   LOCATION:  2011                         FACILITY:  [facility]   PHYSICIAN:  Nicki Guadalajara, M.D.     DATE OF BIRTH:  30-May-1945   DATE OF PROCEDURE:  08/21/2004  DATE OF DISCHARGE:                              CARDIAC CATHETERIZATION   INDICATIONS:  [name] is a very pleasant 65 year old African-American  female who suffered an acute coronary syndrome on November 30, 1999, and  underwent emergent catheterization and intervention to a totally occluded  right coronary artery.  She ultimately required three stents in the  proximal, mid, and mid-distal RCA.  In March 2005, a Cardiolite study showed  mild thinning in the distal apical lateral anterior segment without ischemia  and with normal contractility and a post stress ejection fraction of 63%.  She has a history of hypertension as well as hyperlipidemia.  She was  admitted to [facility] yesterday with chest pain which she stated  was similar to her MI pain.  She was seen by Dr. Allyson Sabal.  Cardiac enzymes  were negative.  However, in light of the similar characteristic of her  discomfort, definitive catheterization was recommended.   PROCEDURE:  After premedication with Valium 5 mg intravenously, the patient  was prepped and draped in the usual fashion.  Her right femoral artery was  punctured anteriorly, and a 5 French sheath was inserted.  Diagnostic  catheterization was done with 5 French Judkins 4 left and right coronary  catheters.  A 5 French pigtail catheter was used for biplane cine left  ventriculography.  With her history of hypertension, distal aortography was  also performed.  Hemostasis was obtained by direct manual pressure.  The  patient tolerated the procedure well and returned to her room in  satisfactory condition.   HEMODYNAMIC DATA:  Central aortic pressure was 131/62, mean 89.  Left  ventricular pressure was 131/21.   ANGIOGRAPHIC DATA:  Left main coronary artery was angiographically normal  and bifurcated into an LAD and left circumflex system.   The LAD had smooth 10% narrowing proximally and gave rise to several septal  perforating arteries and two major diagonal vessels.  Otherwise, the LAD and  its branches were without significant obstruction and essentially  angiographically normal.   The circumflex vessel gave rise to a very proximal OM vessel/ramus  intermediate-like vessel.  There were two small additional marginal vessels.  The circumflex and its branches were angiographically normal.   The right coronary artery was a large caliber dominant vessel that ended in  a large PDA and posterolateral system.  There was a __________ stent in the  proximal bend of the right coronary artery that was widely patent.  The  stent in the midsegment was widely patent, with smooth intimal narrowing of  10%.  The stent beyond this near the crux was widely patent.   Biplane cine left ventriculography revealed normal global contractility.  There was very minimal subtle posterior hypocontractility focally on the RAO  projection and  in the inferolateral wall on the LAO projection, concordant  with her prior MI.  Global function was normal.   Distal aortography revealed mild 20% smooth narrowing in the right renal  artery.  There was irregularity of the distal infrarenal aorta without  significant narrowing.   IMPRESSION:  1.  Normal global LV contractility, with minimal residual focal narrowing in      the posterior and inferolateral wall.  2.  Smooth 10% minimal narrowing in the LAD.  3.  Normal circumflex system.  4.  Three widely patent stents in a large dominant right coronary artery,      without evidence for restenosis.  5.  Irregularity of the distal infrarenal aorta, without aneurysm or      significant stenoses, and evidence for mild 20% smooth narrowing in  the      right renal artery.   RECOMMENDATIONS:  Medical therapy.  The patient will also be scheduled for a  gallbladder ultrasound based on the initial assessment of Dr. Allyson Sabal.      TK/MEDQ  D:  08/21/2004  T:  08/21/2004  Job:  [id number]   cc:   Nicki Guadalajara, M.D.  [phone], [address]  [place], [place] [address]  Fax: [phone]   Angelica Chessman, M.D.  [facility]  [address]  [place]  [place] [address]  Fax: [phone]

## 2010-08-31 NOTE — Discharge Summary (Signed)
[place]  Patient:    Debbie [name], Debbie [name]                         MRN: [id number] Adm. Date:  [id number] Disc. Date: [id number] Attending:  Virgina Evener Dictator:   Darcella Gasman. Annie Paras, F.N.P.C. CC:         Runell Gess, M.D.  Genene Churn. Love, M.D.   Discharge Summary  DISCHARGE DIAGNOSES: 1. Chest pain, nonspecific. 2. Hypertension. 3. Coronary artery disease with stents to the right coronary artery that are    patent per catheterization this admission.    a. 50% mid left anterior descending stenosis with systolic bridging. 4. Hyperlipidemia.  DISCHARGE CONDITION:  Improved.  PROCEDURES:  Combined left heart catheterization on July 29, 2000, by Madaline Savage, M.D.  DISCHARGE MEDICATIONS: 1. Altace 10 mg one daily. 2. Toprol XL 200 mg daily. 3. Norvasc 5 mg one daily. 4. Enteric-coated aspirin 325 mg daily. 5. Protonix 40 mg one daily. 6. PROVIT study continued please. 7. Nitroglycerin 1/150 sublingual p.r.n. chest pain.  DISCHARGE INSTRUCTIONS: 1. No strenuous activity, no sexual activity, and no lifting over 5 pounds for    three days and then may resume regular activity. 2. Low-salt, low-fat diet. 3. Wash catheterization site with soap and water beginning in the morning.    Call if any bleeding, swelling, or drainage. 4. Keep appointment for stress test. 5. Follow up with Runell Gess, M.D., in two weeks.  Call tomorrow for an    appointment if you do not have one.  HISTORY OF PRESENT ILLNESS:  A 65 year old female with a history of hypertension and CVA six years ago while on Coumadin with a history of tobacco use and hyperlipidemia.  She was admitted to [facility] on July 28, 2000, after having chest pain the night before around 10 p.m. while sitting on the sofa.  She had no radiation, shortness of breath, diaphoresis, nausea, or vomiting.  She took aspirin.  The pain continued. About 1:30  a.m., she woke up her husband and told him to bring her to the hospital.  The pain continued until 30 minutes after starting the nitroglycerin drip.  The pain was relieved at that point and never returned.  PAST MEDICAL HISTORY: 1. Cardiac history of MI on November 30, 1999, with 100% RCA stenosis, receiving    three stents to the right coronary.  She also had 50% LAD stenosis proximal    and 60% mid.  The circumflex was 70%. 2. Hypertension. 3. Tobacco. 4. Hyperlipidemia. 5. CVA in 1997.  I am unsure per the history if she was on Coumadin with the    CVA or was treated after the CVA with Coumadin.  OUTPATIENT MEDICATIONS: 1. Altace 10 mg daily. 2. Toprol XL 300 mg daily. 3. Norvasc 5 mg daily. 4. Enteric-coated aspirin 325 mg daily. 5. PROVIT study drug.  ALLERGIES:  No known allergies.  SOCIAL HISTORY:  See H&P.  FAMILY HISTORY:  See H&P.  REVIEW OF SYSTEMS:  See H&P.  PHYSICAL EXAMINATION:  Blood pressure 154/78, respirations 20, temperature 98.6 degrees, heart rate in the 60s.  Right groin stable.  LABORATORY DATA:  Hemoglobin 13, hematocrit 37.9, WBC 8.8, MCV 90.2, platelets 330, neutrophils 50, lymphs 40, mono 8, eos 2, baso 1.  Pro time 12.1, INR 0.9, PTT 27.  Chemistries:  Sodium 142, potassium 4.3, chloride 108, glucose 101, BUN 13, creatinine  0.7.  Cardiac enzymes ranged 194 to 95 with MBs of 0.9 to less than 0.3 and 1 and troponins 0.01, all negative for MI.  Portable chest x-ray:  The lungs were clear, the heart is normal, visualized bones are unremarkable, no acute process.  A repeat repeated no changes.  EKG on admission:  Sinus bradycardia, heart rate 53, inferior T-wave inversion, consider ischemia.  When compared to old records, no acute changes.  Cardiac catheterization on July 29, 2000, revealed patent stents in the RCA and EF 66%.  She did have 50% mid LAD stenosis with systolic bridging.  HOSPITAL COURSE:  Ms. Massimo was admitted on July 28, 2000,  by Madaline Savage, M.D., for chest pain felt to be unstable angina.  Placed on aspirin, Plavix, and nitroglycerin.  She was already on beta blocker and ACE.  IV heparin was added.  Cardiac catheterization was planned.  The patients cardiac enzymes came back negative.  The pain was relieved with a nitroglycerin drip.  She underwent cardiac catheterization on July 29, 2000. Her stents were patent.  It was felt that most likely a GI source was the cause of her pain.  She was put on Protonix.  She would have a stress test as an outpatient with systolic bridging of the LAD and will follow up with Runell Gess, M.D., her primary cardiologist.  She was discharged after catheterization by Madaline Savage, M.D.  She ambulated without difficulty. DD:  08/10/00 TD:  08/11/00 Job: [id number]

## 2010-08-31 NOTE — Discharge Summary (Signed)
NAMEJOSALYN, Debbie [name]                  ACCOUNT NO.:  [id number]   MEDICAL RECORD NO.:  [id number]          PATIENT TYPE:  INP   LOCATION:  2011                         FACILITY:  [facility]   PHYSICIAN:  Sharlet Salina T. Hoxworth, M.D.DATE OF BIRTH:  January 05, 1946   DATE OF ADMISSION:  08/20/2004  DATE OF DISCHARGE:  08/23/2004                                 DISCHARGE SUMMARY   DISCHARGE DIAGNOSES:  1.  Acute cholecystitis status post laparoscopic cholecystectomy on Aug 22, 2004.  2.  Chest pain felt secondary to #1.  She did have a cardiac catheterization      on Aug 21, 2004.  3.  Coronary artery disease.  4.  History of stroke.  5.  Hyperlipidemia, treated.  6.  Status post tubal ligation.   HOSPITAL COURSE:  [name] is a 65 year old female who presented to [facility]  Emergency Room with acute epigastric pain that began on Aug 19, 2004.  The  pain persisted and mimicked her previous myocardial infarction pain.  She  was seen in the ER on Aug 20, 2004 and admitted by cardiology.  Ultimately,  she received a cardiac catheterization that showed no restenosis of her  previous stents located in the right coronary artery.   Because of her normal catheterization she underwent a gallbladder ultrasound  that revealed cholelithiasis as well as gallbladder thickening which is  consistent with acute cholecystitis.  She was then taken to the operating  room on Aug 22, 2004 and underwent a laparoscopic cholecystectomy with  intraoperative cholangiogram by Dr. Johna Sheriff.  She tolerated the procedure  well.  On the following day she was ready for discharge to home.  She was  discharged to home in stable condition on the following medications.   DISCHARGE MEDICATIONS:  1.  Toprol XL 150 mg daily.  2.  Aspirin daily.  3.  Altace 10 mg a day.  4.  Caduet 10/80 one daily.  5.  Hydrochlorothiazide 25 mg a day.  6.  Vicodin one to two tablets every six hours as needed for pain.   She was instructed not to lift  over 10 pounds for four weeks.  Return to  work on Sep 03, 2004.  No driving for one week.  Remain on low fat diet.  Clean incisions gently with soap and water.  No scrubbing.  Call for  temperature over 101.  Follow up with Dr. Johna Sheriff on Sep 06, 2004 at 9 a.m.      LB/MEDQ  D:  08/23/2004  T:  08/23/2004  Job:  [id number]   cc:   Nicki Guadalajara, M.D.  [phone] [address], [address]  [place], [place] [address]  Fax: [phone]   Jonita Albee, M.D.  [facility]  [address]  [place]  [place] [address]  Fax: [phone]

## 2010-08-31 NOTE — Op Note (Signed)
[place]. [facility]  Patient:    Debbie [name], Debbie [name]                         MRN: [id number] Proc. Date: 11/30/99 Adm. Date:  [id number] Disc. Date: [id number] Attending:  Madaline Guthrie CC:         Cath Lab             Robert Bellow, M.D.                           Operative Report  INDICATIONS:  [name] is a 65 year old white female with a history of hypertension and stroke five years ago who we were asked to see in cardiology consultation after she was admitted with substernal chest pain associated with shortness of breath and diaphoresis yesterday.  Subsequent CPK enzymes were positive with initial CPK 116, second CK 596, third CK 750.  The patient is now referred for cardiac catheterization.  HEMODYNAMIC DATA:  Central aortic pressure 128/66, left ventricular pressure 128/22.  ANGIOGRAPHIC DATA:  Left main coronary artery was angiographically normal and bifurcated into an LAD and left circumflex system.  The LAD had proximal long 50% stenosis and mid 60% stenosis.  The circumflex vessel was very tortuous and gave rise to a proximal marginal vessel.  The marginal vessel was widely patent.  There was 70% stenosis in the very tortous sharp angle of the proximal circumflex and 60 to 70% mid AV groove circumflex stenosis.  Collaterals were present from septal perforating arteries to the distal right coronary artery.  The right coronary artery had Shepherds crook type of takeoff and then had 30% proximal stenosis followed by total occlusion in the proximal to midsegment.  Biplane cineangiography revealed hypocontractility to akinesis in the mid to basal posterior wall on the RAO projection and inferolateral to mid posterolateral hypocontractility in the RAO and LAO projection.  At this time, cineangiograms were reviewed with colleagues as well as the patients family.  It was felt that the patient had recent occlusion of her right coronary artery  probably over 24 hours.  She was not having any significant chest pain, most likely due to collateralization.  However, CPK enzymes were positive.  She does have concomitant contralateral CAD and for this reason it was felt that attempt at opening the total occlusion was warranted both for potential preservation of LV function as well as potential improvement in future collateral flow in the event of contralateral progressive coronary obstructive disease.  The arterial sheath was then upgraded to a 7 [place] system.  Heparin and Aggrastat were used during the intervention.  An FR4 guide with side holes 7 [place] system was initially used.  The initial wire was advanced to the occlusion, but was unable to cross the occlusion secondary to catheter backup problems.  The guide was then exchanged for a 7 [place] hockey stick guide. A Hi-Torque floppy wire was then again advanced, but this wire now was unable to traverse the [employer] turn.  It became obvious that there was now some staning within the wall of the proximal crux suggesting the beginning of dissection.  Ultimately, an open cell balloon was then inserted and through this backup support, a Hi-Torque intermediate wire was ultimately able to get into the true lumen of the proximal right coronary artery and was ultimately able to cross the total occlusion with the balloon support.  The wire  was then advanced down the distal right coronary artery.  Dilatation was done in the midsegment initially 2, 3, 4, and ultimately 8 atmospheres.  It became apparent that there was also high grade significant blockage more distal in the right coronary artery of 90% just beyond the crux and proximal to the PDA takeoff. Balloon dilatation was done distally as well.  With the balloon being pulled back, dilatation was also done at the proximal Shepherds crook segment. All three lesions were ultimately stented with the distal right coronary artery having  2.5 x 18 mm S70 stent being successfully deployed with percent diameter reduction being reduced from 95% to 0%; the mid right coronary artery being stented with a 3.0 x 24 mm S70 stent with the 100% occlusion being reduced to 0%, the proximal right coronary artery having a 3.5 x 24 mm S70 stent with complete resolution of the proximal dissection and residual narrowing of 0%.  Arterial sheath was sutured in place with plans for sheath removal later today.  IMPRESSION: 1. One day status post non-Q wave myocardial infarction secondary to total    proximal to mid right coronary artery occlusion with left to right    collaterals. 2. Mid to basal posterior as well as low to mid posterolateral hypo to    akinesis with mild global left ventricular dysfunction.  Next 50% proximal    left anterior descending stenosis, 60% middle left anterior descending    stenosis. 3. 70% stenosis in the proximal circumflex coronary artery after the obtuse    marginal 1 takeoff on a very sharp bend in the vessel with 60 to 70%    mid AV groove circumflex stenosis. 4. 100% occlusion of the proximal to mid right coronary artery. 5. Complex, difficult but ultimately successful stenting at multiple sites    in the right coronary artery with the distal 90% stenosis being reduced    to 0% (2.5 x 18 mm stent), the proximal to mid site of occlusion being    reduced from 100% to 0% (3.0 x 24 mm S70 stent), and the 70% proximal    right coronary artery narrowing at site of initial dissection being    reduced to 0% (3.5 x 24 mm S70 stent) done with Aggrastat/heparinization. DD:  01/02/00 TD:  01/02/00 Job: [id number]

## 2011-11-14 DIAGNOSIS — I480 Paroxysmal atrial fibrillation: Secondary | ICD-10-CM

## 2011-11-14 HISTORY — DX: Paroxysmal atrial fibrillation: I48.0

## 2011-12-06 ENCOUNTER — Encounter (HOSPITAL_COMMUNITY): Payer: Self-pay | Admitting: Adult Health

## 2011-12-06 ENCOUNTER — Emergency Department (HOSPITAL_COMMUNITY)
Admission: EM | Admit: 2011-12-06 | Discharge: 2011-12-07 | Disposition: A | Discharge status: Home / Self Care | Payer: Medicare Other | Attending: Emergency Medicine | Admitting: Emergency Medicine

## 2011-12-06 DIAGNOSIS — I4891 Unspecified atrial fibrillation: Secondary | ICD-10-CM | POA: Insufficient documentation

## 2011-12-06 DIAGNOSIS — I252 Old myocardial infarction: Secondary | ICD-10-CM | POA: Insufficient documentation

## 2011-12-06 DIAGNOSIS — I10 Essential (primary) hypertension: Secondary | ICD-10-CM | POA: Insufficient documentation

## 2011-12-06 DIAGNOSIS — I480 Paroxysmal atrial fibrillation: Secondary | ICD-10-CM

## 2011-12-06 DIAGNOSIS — Z79899 Other long term (current) drug therapy: Secondary | ICD-10-CM | POA: Insufficient documentation

## 2011-12-06 DIAGNOSIS — R002 Palpitations: Secondary | ICD-10-CM | POA: Insufficient documentation

## 2011-12-06 HISTORY — DX: Essential (primary) hypertension: I10

## 2011-12-06 HISTORY — DX: Pure hypercholesterolemia, unspecified: E78.00

## 2011-12-06 HISTORY — DX: Cerebral infarction, unspecified: I63.9

## 2011-12-06 HISTORY — DX: Acute myocardial infarction, unspecified: I21.9

## 2011-12-06 LAB — POCT I-STAT TROPONIN I: Troponin i, poc: 0.01 ng/mL (ref 0.00–0.08)

## 2011-12-06 LAB — CBC
HCT: 39.8 % (ref 36.0–46.0)
Hemoglobin: 13.6 g/dL (ref 12.0–15.0)
MCHC: 34.2 g/dL (ref 30.0–36.0)
RDW: 13.6 % (ref 11.5–15.5)

## 2011-12-06 NOTE — ED Notes (Addendum)
Reports palpitaitons, (Denies: pain, sob, nausea, HA, dizziness, blurred vision, near syncope, weakness or other sx), placed on O2 [place] 2L afib on monitor, HR 122.

## 2011-12-06 NOTE — ED Notes (Signed)
Reports feeling heart "flutters" approx  45 minutes ago. Denies SOB, denies chest pain. HR 110s-120s. Pt alert and oriented and answers all questions appropriately.

## 2011-12-07 ENCOUNTER — Emergency Department (HOSPITAL_COMMUNITY): Payer: Medicare Other

## 2011-12-07 LAB — BASIC METABOLIC PANEL
Creatinine, Ser: 1.19 mg/dL — ABNORMAL HIGH (ref 0.50–1.10)
GFR calc Af Amer: 54 mL/min — ABNORMAL LOW (ref >90)
GFR calc non Af Amer: 46 mL/min — ABNORMAL LOW (ref >90)

## 2011-12-07 LAB — PROTIME-INR
INR: 0.9 (ref 0.00–1.49)
Prothrombin Time: 12.3 seconds (ref 11.6–15.2)

## 2011-12-07 LAB — URINALYSIS, ROUTINE W REFLEX MICROSCOPIC
Bilirubin Urine: NEGATIVE
Glucose, UA: NEGATIVE mg/dL
Specific Gravity, Urine: 1.016 (ref 1.005–1.030)
pH: 6 (ref 5.0–8.0)

## 2011-12-07 LAB — POCT I-STAT TROPONIN I

## 2011-12-07 LAB — URINE MICROSCOPIC-ADD ON

## 2011-12-07 LAB — APTT: aPTT: 34 seconds (ref 24–37)

## 2011-12-07 IMAGING — CR DG CHEST 1V PORT
1 series · 1 of 1 positions shown · non-contrast
Comparison: [DATE]

CLINICAL DATA: Heart palpitations

PORTABLE CHEST - 1 VIEW

[AP]
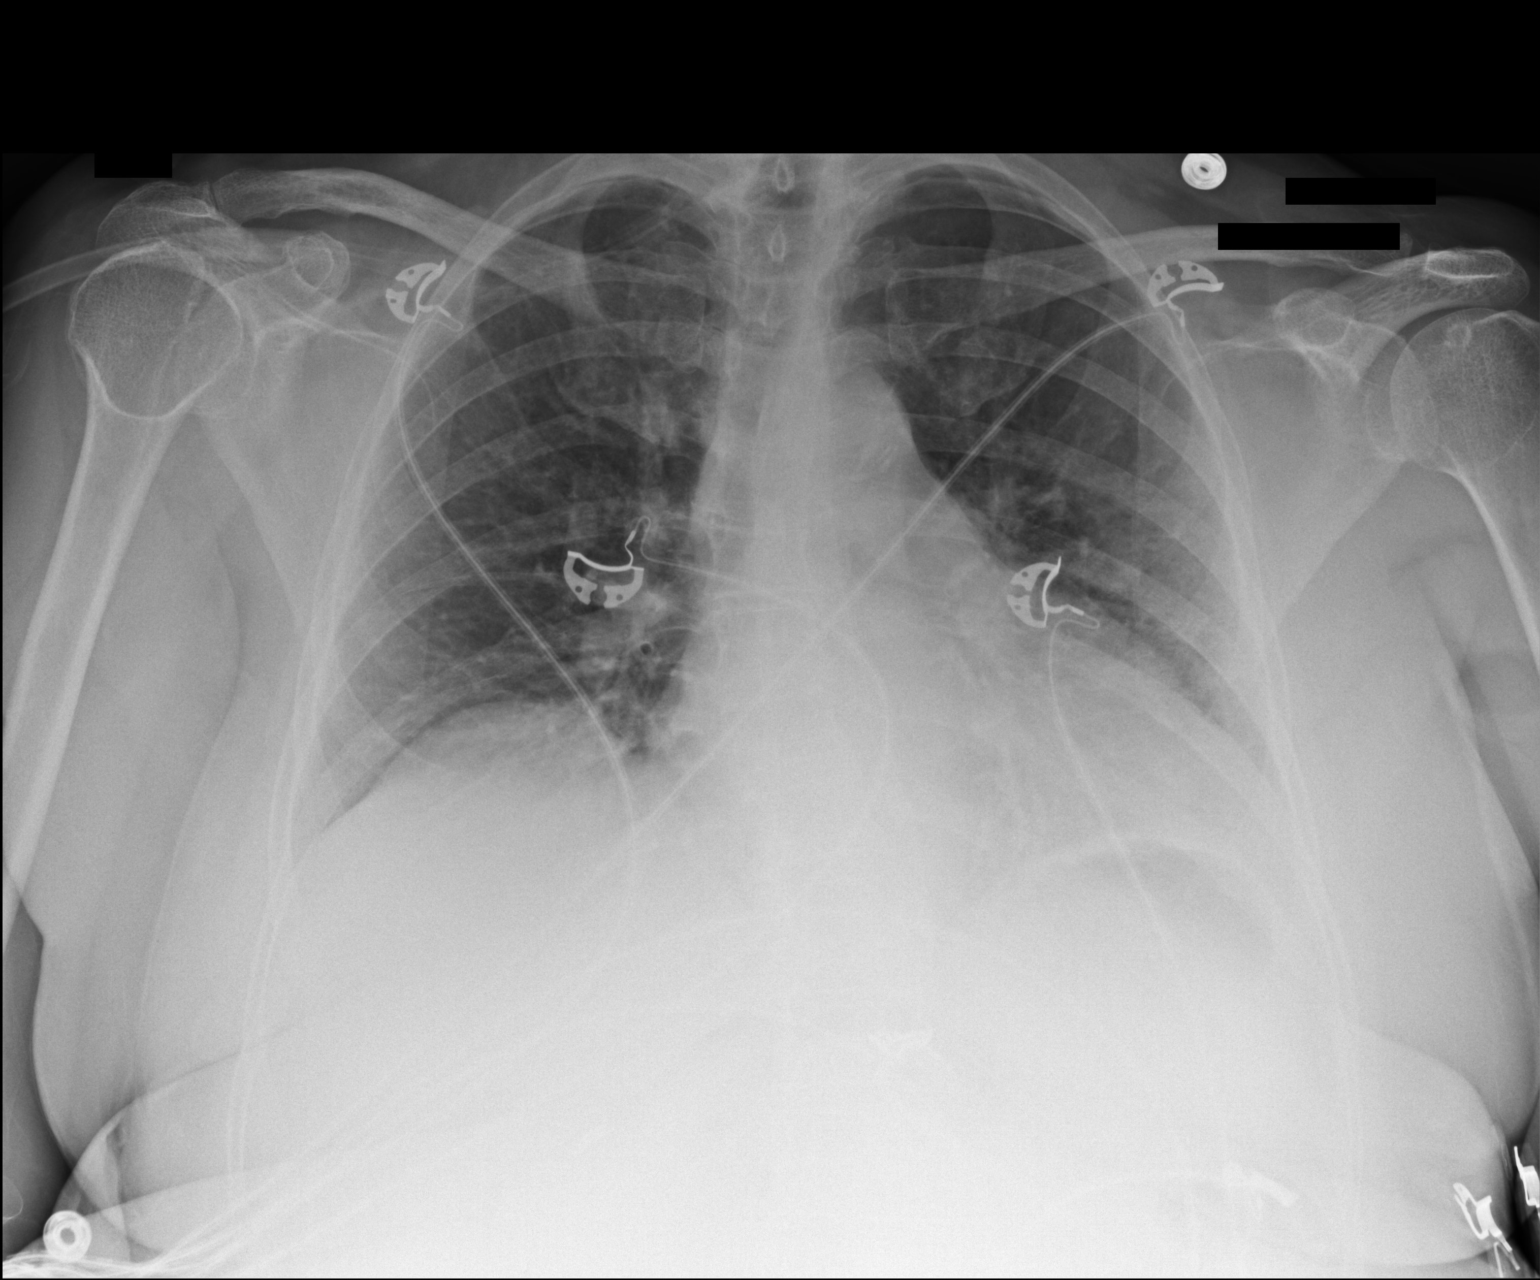

[1 of 1 positions shown; findings below may reference images not displayed]

FINDINGS: Heart size mildly enlarged.  Hemidiaphragm elevation due
to hypoaeration.  Mild bibasilar opacities.  No pleural effusion or
pneumothorax.  Aortic arch atherosclerosis.  No acute osseous
finding.
IMPRESSION: Mild cardiomegaly.

Bibasilar opacities, likely atelectasis.

## 2011-12-07 MED ORDER — DILTIAZEM HCL 25 MG/5ML IV SOLN
10.0000 mg | Freq: Once | INTRAVENOUS | Status: AC
  Administered 2011-12-07: 10 mg via INTRAVENOUS
  Filled 2011-12-07: qty 5

## 2011-12-07 NOTE — ED Notes (Signed)
Pt converted to NSR, HR 60, EKG repeated, no other changes, pt watching TV, husband at [facility], EDP into room.

## 2011-12-07 NOTE — ED Notes (Addendum)
No changes, watching TV.

## 2011-12-07 NOTE — ED Notes (Signed)
No changes, husband went home, EDP to see pt , pt aware,

## 2011-12-07 NOTE — ED Provider Notes (Signed)
History     CSN: [id number]  Arrival date & time 12/06/11  2312   First MD Initiated Contact with Patient 12/06/11 2352      Chief Complaint  Patient presents with  . Palpitations    (Consider location/radiation/quality/duration/timing/severity/associated sxs/prior treatment) HPI Pt p/w palpitations x 45 min. Described as irregular. No CP, SOB, dizziness, abd pain, N/V/D or recent illness. No previous episodes of Afib. Pt states she see a [facility].  Past Medical History  Diagnosis Date  . Myocardial infarction   . Stroke   . Hypertension   . Hypercholesteremia     No past surgical history on file.  No family history on file.  History  Substance Use Topics  . Smoking status: Current Everyday Smoker    Types: Cigarettes  . Smokeless tobacco: Not on file  . Alcohol Use: No    OB History    Grav Para Term Preterm Abortions TAB SAB Ect Mult Living                  Review of Systems  Constitutional: Negative for fever, chills, diaphoresis and fatigue.  Eyes: Negative for visual disturbance.  Respiratory: Negative for cough and shortness of breath.   Cardiovascular: Positive for palpitations. Negative for chest pain and leg swelling.  Gastrointestinal: Negative for nausea, vomiting and abdominal pain.  Genitourinary: Negative for dysuria and frequency.  Musculoskeletal: Negative for myalgias and back pain.  Skin: Negative for rash and wound.  Neurological: Negative for dizziness, weakness, light-headedness, numbness and headaches.    Allergies  Review of patient's allergies indicates no known allergies.  Home Medications   Current Outpatient Rx  Name Route Sig Dispense Refill  . AMLODIPINE-ATORVASTATIN 10-80 MG PO TABS Oral Take 1 tablet by mouth every evening.    . ASPIRIN EC 325 MG PO TBEC Oral Take 325 mg by mouth daily.    Marland Kitchen EZETIMIBE 10 MG PO TABS Oral Take 10 mg by mouth daily.    Marland Kitchen HYDROCHLOROTHIAZIDE 25 MG PO TABS Oral Take 25 mg by mouth  daily.    Marland Kitchen METOPROLOL SUCCINATE ER 50 MG PO TB24 Oral Take 50 mg by mouth daily. Take with or immediately following a meal.    . RAMIPRIL 10 MG PO CAPS Oral Take 10 mg by mouth daily.      BP 122/61  Pulse 69  Temp 98.5 F (36.9 C) (Oral)  Resp 18  SpO2 99%  Physical Exam  Nursing note and vitals reviewed. Constitutional: She is oriented to person, place, and time. She appears well-developed and well-nourished. No distress.  HENT:  Head: Normocephalic and atraumatic.  Mouth/Throat: Oropharynx is clear and moist.  Eyes: EOM are normal. Pupils are equal, round, and reactive to light.  Neck: Normal range of motion. Neck supple. No thyromegaly present.  Cardiovascular:       Tachy irreg irreg  Pulmonary/Chest: Effort normal and breath sounds normal. No respiratory distress. She has no wheezes. She has no rales. She exhibits no tenderness.  Abdominal: Soft. Bowel sounds are normal. There is no tenderness. There is no rebound and no guarding.  Musculoskeletal: Normal range of motion. She exhibits no edema and no tenderness.       No calf swelling or pain  Neurological: She is alert and oriented to person, place, and time.       5/5 motor, sensation intact  Skin: Skin is warm and dry. No rash noted. No erythema.  Psychiatric: She has a normal mood and  affect. Her behavior is normal.    ED Course  Procedures (including critical care time)  Labs Reviewed  CBC - Abnormal; Notable for the following:    WBC 11.6 (*)     All other components within normal limits  BASIC METABOLIC PANEL - Abnormal; Notable for the following:    Glucose, Bld 135 (*)     BUN 24 (*)     Creatinine, Ser 1.19 (*)     GFR calc non Af Amer 46 (*)     GFR calc Af Amer 54 (*)     All other components within normal limits  URINALYSIS, ROUTINE W REFLEX MICROSCOPIC - Abnormal; Notable for the following:    Leukocytes, UA SMALL (*)     All other components within normal limits  URINE MICROSCOPIC-ADD ON -  Abnormal; Notable for the following:    Squamous Epithelial / LPF FEW (*)     Casts HYALINE CASTS (*)     All other components within normal limits  POCT I-STAT TROPONIN I  PROTIME-INR  APTT  TSH  POCT I-STAT TROPONIN I   Dg Chest Port 1 View  12/07/2011  *RADIOLOGY REPORT*  Clinical Data: Heart palpitations  PORTABLE CHEST - 1 VIEW  Comparison: 08/20/2004  Findings: Heart size mildly enlarged.  Hemidiaphragm elevation due to hypoaeration.  Mild bibasilar opacities.  No pleural effusion or pneumothorax.  Aortic arch atherosclerosis.  No acute osseous finding.  IMPRESSION: Mild cardiomegaly.  Bibasilar opacities, likely atelectasis.   Original Report Authenticated By: Waneta Martins, M.D.      1. Paroxysmal a-fib       Date: 12/07/2011  Rate: 126  Rhythm: atrial fibrillation  QRS Axis: normal  Intervals: normal  ST/T Wave abnormalities: nonspecific T wave changes  Conduction Disutrbances:none  Narrative Interpretation:   Old EKG Reviewed: changes noted afib and t wave inversion in inf/lat leads  MDM  Pt has revert to NSR. She is now completely symptom free. Neg trop x 2  Discussed with Dr Herbie Baltimore. States pt can go home and he will call the pt tomorrow to set up clinic visit. Pt instructed to return immediately for return of symptoms or any concerns. Pt agrees with this plan      Loren Racer, MD 12/07/11 2332

## 2011-12-07 NOTE — ED Notes (Signed)
Out to d/c desk & w/r to wait for husband, denies dizziness or other sx, steady gait, NAD, calm, interactive. Denies questions concerns or needs unmet.

## 2011-12-07 NOTE — ED Notes (Signed)
Resting/ sleeping, NAD, calm.  

## 2011-12-07 NOTE — ED Notes (Signed)
No changes, pt watching TV, "husband went home", "feel fine", denies sx, HR 49 NSR. EDP to see pt re: plan.

## 2011-12-07 NOTE — ED Notes (Signed)
BP initially low after cardizem, pt asymptomatic, will continue to monitor.

## 2011-12-07 NOTE — ED Notes (Signed)
BP improved. No changes, alert, NAD, calm, interactive, husband at [facility], xray finished at [facility].

## 2011-12-12 DIAGNOSIS — Z9289 Personal history of other medical treatment: Secondary | ICD-10-CM

## 2011-12-12 HISTORY — DX: Personal history of other medical treatment: Z92.89

## 2011-12-14 ENCOUNTER — Encounter (HOSPITAL_COMMUNITY): Payer: Self-pay | Admitting: Adult Health

## 2011-12-14 ENCOUNTER — Inpatient Hospital Stay (HOSPITAL_COMMUNITY)
Admission: EM | Admit: 2011-12-14 | Discharge: 2011-12-14 | DRG: 313 | Disposition: A | Discharge status: Home / Self Care | Payer: Medicare Other | Attending: Family Medicine | Admitting: Family Medicine

## 2011-12-14 ENCOUNTER — Inpatient Hospital Stay (HOSPITAL_COMMUNITY): Payer: Medicare Other

## 2011-12-14 DIAGNOSIS — Z9861 Coronary angioplasty status: Secondary | ICD-10-CM

## 2011-12-14 DIAGNOSIS — R079 Chest pain, unspecified: Secondary | ICD-10-CM

## 2011-12-14 DIAGNOSIS — E7800 Pure hypercholesterolemia, unspecified: Secondary | ICD-10-CM | POA: Diagnosis present

## 2011-12-14 DIAGNOSIS — I480 Paroxysmal atrial fibrillation: Secondary | ICD-10-CM | POA: Diagnosis present

## 2011-12-14 DIAGNOSIS — I252 Old myocardial infarction: Secondary | ICD-10-CM

## 2011-12-14 DIAGNOSIS — R001 Bradycardia, unspecified: Secondary | ICD-10-CM | POA: Diagnosis present

## 2011-12-14 DIAGNOSIS — F17200 Nicotine dependence, unspecified, uncomplicated: Secondary | ICD-10-CM | POA: Diagnosis present

## 2011-12-14 DIAGNOSIS — I10 Essential (primary) hypertension: Secondary | ICD-10-CM | POA: Diagnosis present

## 2011-12-14 DIAGNOSIS — I498 Other specified cardiac arrhythmias: Secondary | ICD-10-CM | POA: Diagnosis present

## 2011-12-14 DIAGNOSIS — I2510 Atherosclerotic heart disease of native coronary artery without angina pectoris: Secondary | ICD-10-CM | POA: Diagnosis present

## 2011-12-14 DIAGNOSIS — E785 Hyperlipidemia, unspecified: Secondary | ICD-10-CM | POA: Diagnosis present

## 2011-12-14 DIAGNOSIS — Z8673 Personal history of transient ischemic attack (TIA), and cerebral infarction without residual deficits: Secondary | ICD-10-CM

## 2011-12-14 LAB — PROTIME-INR: Prothrombin Time: 13.4 seconds (ref 11.6–15.2)

## 2011-12-14 LAB — BASIC METABOLIC PANEL
BUN: 14 mg/dL (ref 6–23)
BUN: 15 mg/dL (ref 6–23)
CO2: 24 mEq/L (ref 19–32)
Chloride: 102 mEq/L (ref 96–112)
Creatinine, Ser: 1.01 mg/dL (ref 0.50–1.10)
Creatinine, Ser: 0.87 mg/dL (ref 0.50–1.10)
GFR calc Af Amer: 78 mL/min — ABNORMAL LOW (ref >90)
GFR calc non Af Amer: 56 mL/min — ABNORMAL LOW (ref >90)
Glucose, Bld: 168 mg/dL — ABNORMAL HIGH (ref 70–99)
Potassium: 4.2 mEq/L (ref 3.5–5.1)
Potassium: 3.3 mEq/L — ABNORMAL LOW (ref 3.5–5.1)

## 2011-12-14 LAB — CBC
HCT: 38.9 % (ref 36.0–46.0)
HCT: 34.8 % — ABNORMAL LOW (ref 36.0–46.0)
Hemoglobin: 12.9 g/dL (ref 12.0–15.0)
Hemoglobin: 11.8 g/dL — ABNORMAL LOW (ref 12.0–15.0)
MCH: 30.3 pg (ref 26.0–34.0)
MCHC: 33.2 g/dL (ref 30.0–36.0)
MCV: 91.3 fL (ref 78.0–100.0)
MCV: 89.7 fL (ref 78.0–100.0)
RDW: 13.6 % (ref 11.5–15.5)
RDW: 13.5 % (ref 11.5–15.5)
WBC: 8 10*3/uL (ref 4.0–10.5)

## 2011-12-14 LAB — HEMOGLOBIN A1C
Hgb A1c MFr Bld: 6.5 % — ABNORMAL HIGH (ref <5.7)
Mean Plasma Glucose: 140 mg/dL — ABNORMAL HIGH (ref <117)

## 2011-12-14 LAB — LIPID PANEL
Cholesterol: 146 mg/dL (ref 0–200)
HDL: 57 mg/dL (ref >39)
Total CHOL/HDL Ratio: 2.6 RATIO

## 2011-12-14 LAB — APTT: aPTT: 31 seconds (ref 24–37)

## 2011-12-14 LAB — TROPONIN I: Troponin I: <0.3 ng/mL (ref <0.30)

## 2011-12-14 IMAGING — CR DG CHEST 2V
2 series · 2 of 2 positions shown · non-contrast
Comparison: [DATE]

CLINICAL DATA: Chest pain and cough.  Possible pneumonia.  Ex-
smoker.  Hypertension.

CHEST - 2 VIEW

[w chest pa]
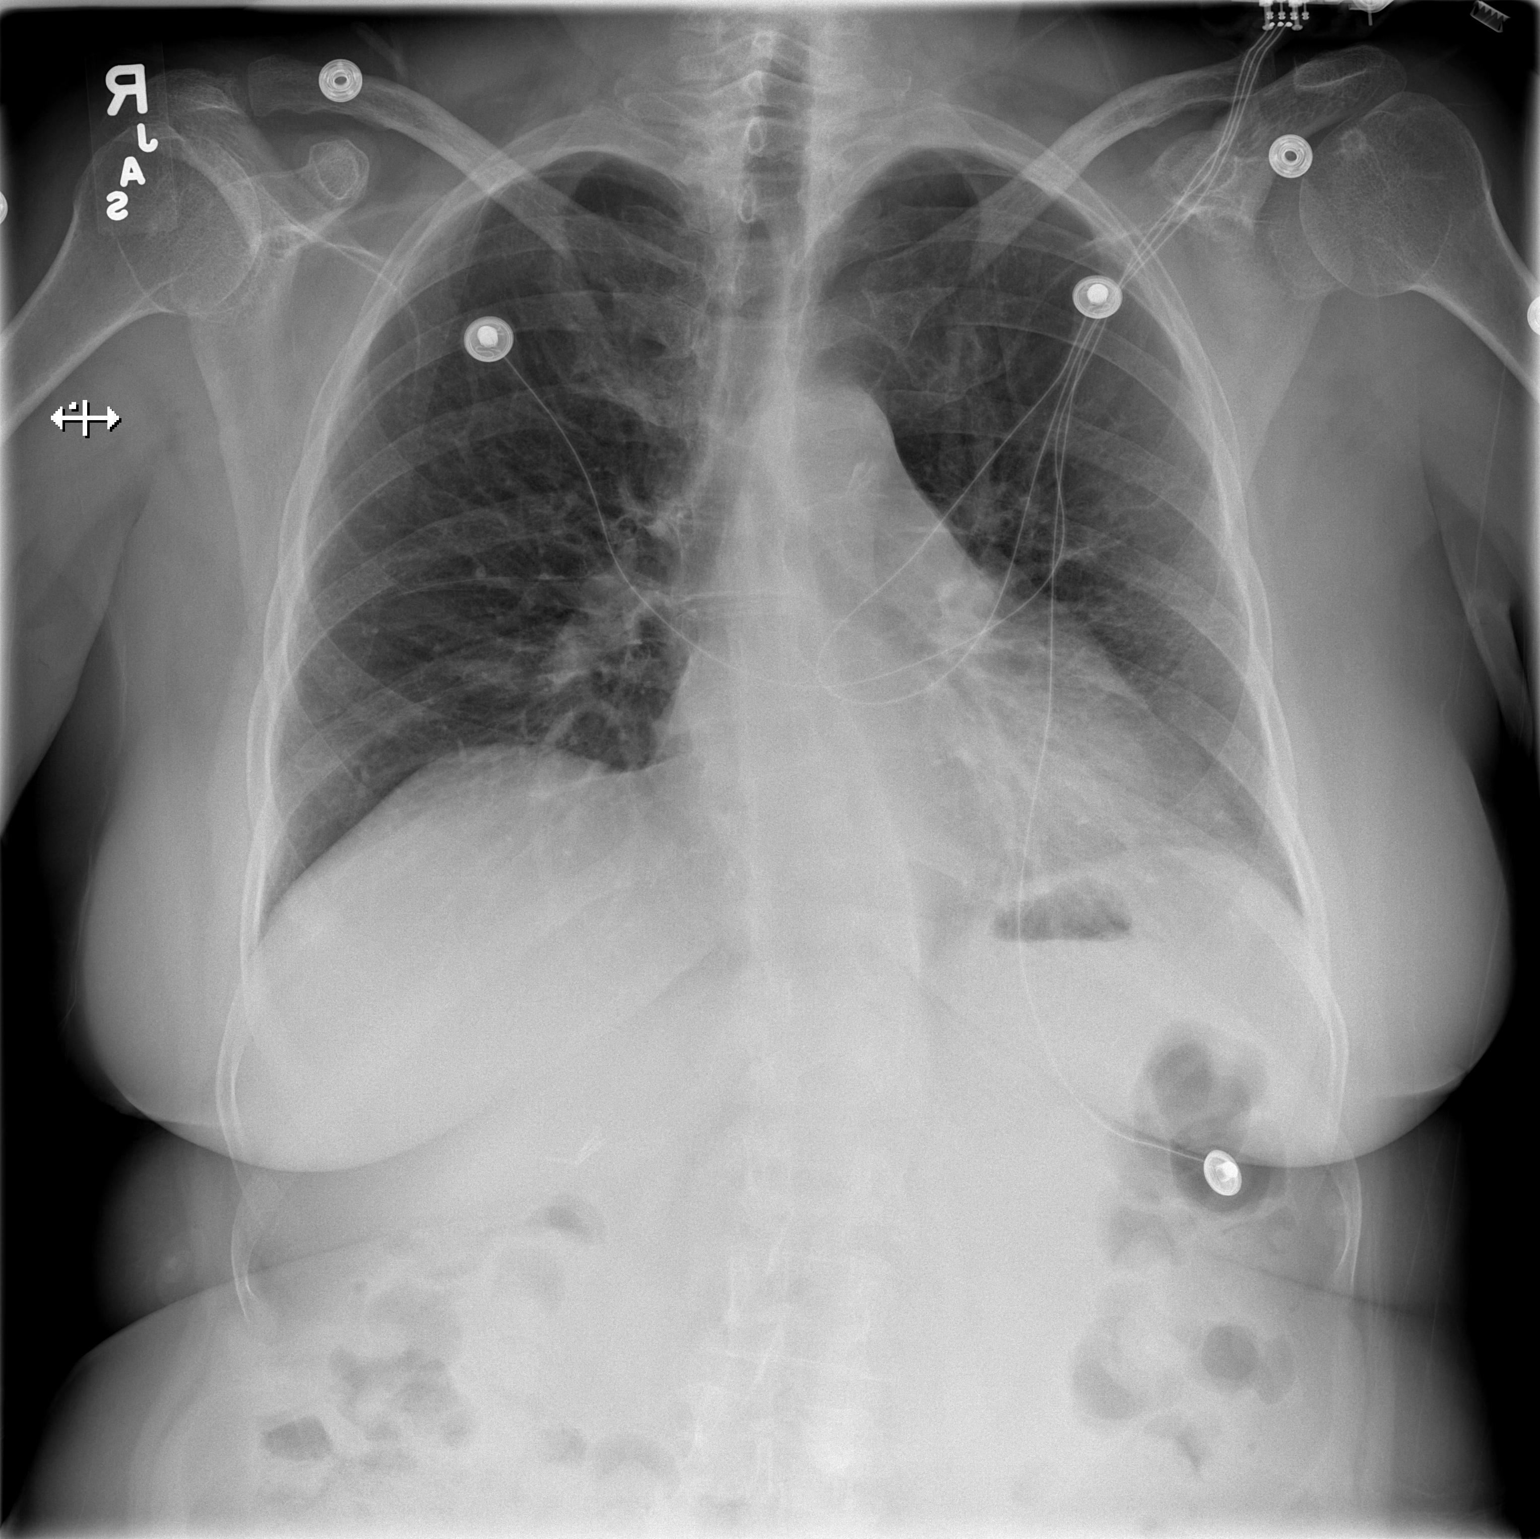

[w chest lat]
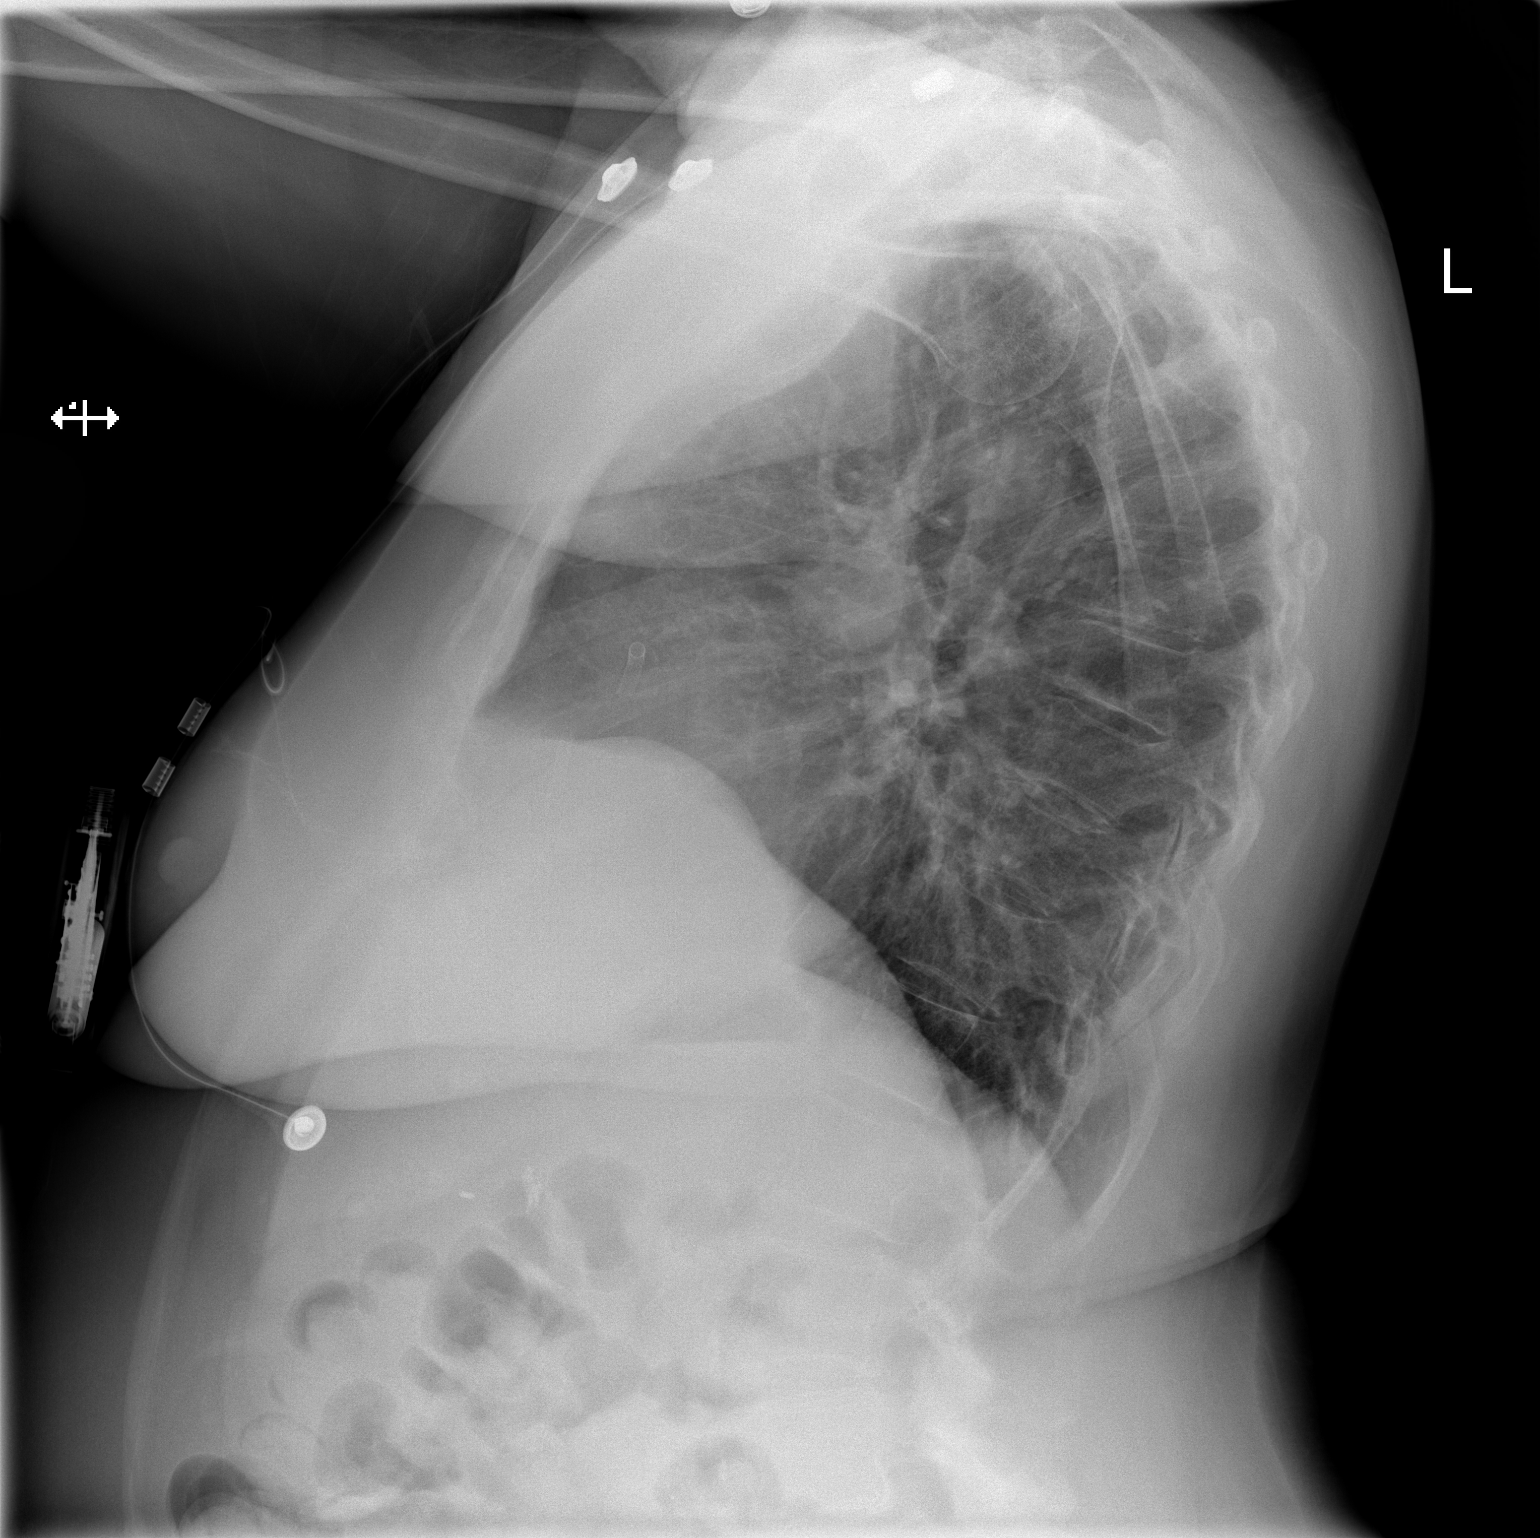

[2 of 2 positions shown; findings below may reference images not displayed]

FINDINGS: Lateral view degraded by patient arm position.

Suspect an upper thoracic mild compression deformity, suboptimally
evaluated.

Cholecystectomy.  Minimal S-shaped thoracolumbar spine curvature.
Midline trachea.  Mild cardiomegaly.  Atherosclerosis in the
transverse aorta. No pleural effusion or pneumothorax.  No
congestive failure.

Clear lungs.
IMPRESSION: 1.  No evidence of pneumonia.
2.  Mild cardiomegaly, without acute disease.
3.  Possible mild upper thoracic compression deformity.

## 2011-12-14 MED ORDER — ASPIRIN 81 MG PO CHEW
324.0000 mg | CHEWABLE_TABLET | Freq: Once | ORAL | Status: AC
  Administered 2011-12-14: 324 mg via ORAL
  Filled 2011-12-14: qty 4

## 2011-12-14 MED ORDER — ATORVASTATIN CALCIUM 80 MG PO TABS
80.0000 mg | ORAL_TABLET | Freq: Every day | ORAL | Status: DC
  Filled 2011-12-14: qty 1

## 2011-12-14 MED ORDER — AMLODIPINE-ATORVASTATIN 10-80 MG PO TABS: 1.0000 | ORAL_TABLET | Freq: Every evening | ORAL | Status: DC

## 2011-12-14 MED ORDER — IBUPROFEN 600 MG PO TABS
600.0000 mg | ORAL_TABLET | Freq: Four times a day (QID) | ORAL | Status: DC | PRN
  Filled 2011-12-14: qty 1

## 2011-12-14 MED ORDER — ISOSORBIDE MONONITRATE ER 30 MG PO TB24: 30.0000 mg | ORAL_TABLET | Freq: Every day | ORAL | Status: DC

## 2011-12-14 MED ORDER — AMLODIPINE BESYLATE 10 MG PO TABS
10.0000 mg | ORAL_TABLET | Freq: Every day | ORAL | Status: DC
  Administered 2011-12-14: 10 mg via ORAL
  Filled 2011-12-14: qty 1

## 2011-12-14 MED ORDER — HYDROCHLOROTHIAZIDE 25 MG PO TABS
25.0000 mg | ORAL_TABLET | Freq: Every day | ORAL | Status: DC
  Administered 2011-12-14: 25 mg via ORAL
  Filled 2011-12-14: qty 1

## 2011-12-14 MED ORDER — NITROGLYCERIN 2 % TD OINT
0.5000 [in_us] | TOPICAL_OINTMENT | Freq: Once | TRANSDERMAL | Status: AC
  Administered 2011-12-14: 03:00:00 via TOPICAL
  Filled 2011-12-14: qty 1

## 2011-12-14 MED ORDER — EZETIMIBE 10 MG PO TABS
10.0000 mg | ORAL_TABLET | Freq: Every day | ORAL | Status: DC
  Administered 2011-12-14: 10 mg via ORAL
  Filled 2011-12-14: qty 1

## 2011-12-14 MED ORDER — RAMIPRIL 10 MG PO CAPS
10.0000 mg | ORAL_CAPSULE | Freq: Every day | ORAL | Status: DC
  Administered 2011-12-14: 10 mg via ORAL
  Filled 2011-12-14: qty 1

## 2011-12-14 MED ORDER — ASPIRIN EC 325 MG PO TBEC
325.0000 mg | DELAYED_RELEASE_TABLET | Freq: Every day | ORAL | Status: DC
  Administered 2011-12-14: 325 mg via ORAL
  Filled 2011-12-14 (×2): qty 1

## 2011-12-14 MED ORDER — POTASSIUM CHLORIDE CRYS ER 20 MEQ PO TBCR
40.0000 meq | EXTENDED_RELEASE_TABLET | Freq: Once | ORAL | Status: AC
  Administered 2011-12-14: 40 meq via ORAL
  Filled 2011-12-14: qty 2

## 2011-12-14 MED ORDER — ONDANSETRON HCL 4 MG/2ML IJ SOLN: 4.0000 mg | Freq: Four times a day (QID) | INTRAMUSCULAR | Status: DC | PRN

## 2011-12-14 MED ORDER — SODIUM CHLORIDE 0.9 % IJ SOLN: 3.0000 mL | Freq: Two times a day (BID) | INTRAMUSCULAR | Status: DC

## 2011-12-14 MED ORDER — ACETAMINOPHEN 650 MG RE SUPP: 650.0000 mg | Freq: Four times a day (QID) | RECTAL | Status: DC | PRN

## 2011-12-14 MED ORDER — HEPARIN SODIUM (PORCINE) 5000 UNIT/ML IJ SOLN
5000.0000 [IU] | Freq: Three times a day (TID) | INTRAMUSCULAR | Status: DC
  Administered 2011-12-14 (×2): 5000 [IU] via SUBCUTANEOUS
  Filled 2011-12-14 (×4): qty 1

## 2011-12-14 MED ORDER — SODIUM CHLORIDE 0.9 % IJ SOLN: 3.0000 mL | INTRAMUSCULAR | Status: DC | PRN

## 2011-12-14 MED ORDER — SODIUM CHLORIDE 0.9 % IV SOLN: 250.0000 mL | INTRAVENOUS | Status: DC | PRN

## 2011-12-14 MED ORDER — ONDANSETRON HCL 4 MG PO TABS: 4.0000 mg | ORAL_TABLET | Freq: Four times a day (QID) | ORAL | Status: DC | PRN

## 2011-12-14 MED ORDER — SODIUM CHLORIDE 0.9 % IJ SOLN
3.0000 mL | Freq: Two times a day (BID) | INTRAMUSCULAR | Status: DC
Start: 2011-12-14 — End: 2011-12-14
  Administered 2011-12-14: 3 mL via INTRAVENOUS

## 2011-12-14 MED ORDER — ACETAMINOPHEN 325 MG PO TABS: 650.0000 mg | ORAL_TABLET | Freq: Four times a day (QID) | ORAL | Status: DC | PRN

## 2011-12-14 NOTE — Consult Note (Signed)
[facility]       CONSULTATION NOTE   Reason for Consult: Chest pain  Requesting Physician: Family medicine teaching service  Cardiologist: Dr. Tresa Endo  HPI: This is a 66 y.o. female with a past medical history significant for CAD s/p PCI to the RCA with 3 stents in 2001 with a re-look cardiac cath for chest pain in 2006 which showed a patent stent. In 2010 she had a lexiscan myoview which was negative for ischemia. 1 week ago she was in the hospital for palpitations and was in atrial fibrillation with controlled ventricular response at 90 bpm.  She spontaneously converted to sinus rhythm and was discharged. She was seen in follow-up in our office on 12/10/2011 and felt with an isolated episode of a-fib, she would not need further anticoagulation. A monitor was arranged to determine her burden of atrial fibrillation. She has had bradycardia on metoprolol and likely has sick sinus syndrome. She now returns with chest pain and we are asked to evaluate. Of note, she reports a URI and has had a non-productive cough.  PMHx:  Past Medical History  Diagnosis Date  . Myocardial infarction   . Hypertension   . Hypercholesteremia   . Stroke 1998   History reviewed. No pertinent past surgical history.  FAMHx: Family History  Problem Relation Age of Onset  . Diabetes type II Sister   . Heart disease Sister     SOCHx:  reports that she has quit smoking. Her smoking use included Cigarettes. She quit after 60 years of use. She quit smokeless tobacco use 3 days ago. She reports that she does not drink alcohol or use illicit drugs.  ALLERGIES: No Known Allergies  ROS: A comprehensive review of systems was negative except for: Constitutional: positive for malaise Respiratory: positive for cough and pleurisy/chest pain Cardiovascular: positive for chest pressure/discomfort  HOME MEDICATIONS: Prescriptions prior to admission  Medication Sig Dispense Refill  .  amLODipine-atorvastatin (CADUET) 10-80 MG per tablet Take 1 tablet by mouth every evening.      Marland Kitchen aspirin EC 325 MG tablet Take 325 mg by mouth daily.      Marland Kitchen ezetimibe (ZETIA) 10 MG tablet Take 10 mg by mouth daily.      . hydrochlorothiazide (HYDRODIURIL) 25 MG tablet Take 25 mg by mouth daily.      . metoprolol succinate (TOPROL-XL) 50 MG 24 hr tablet Take 50 mg by mouth daily. Take with or immediately following a meal.      . ramipril (ALTACE) 10 MG capsule Take 10 mg by mouth daily.        HOSPITAL MEDICATIONS: Prior to Admission:  Prescriptions prior to admission  Medication Sig Dispense Refill  . amLODipine-atorvastatin (CADUET) 10-80 MG per tablet Take 1 tablet by mouth every evening.      Marland Kitchen aspirin EC 325 MG tablet Take 325 mg by mouth daily.      Marland Kitchen ezetimibe (ZETIA) 10 MG tablet Take 10 mg by mouth daily.      . hydrochlorothiazide (HYDRODIURIL) 25 MG tablet Take 25 mg by mouth daily.      . metoprolol succinate (TOPROL-XL) 50 MG 24 hr tablet Take 50 mg by mouth daily. Take with or immediately following a meal.      . ramipril (ALTACE) 10 MG capsule Take 10 mg by mouth daily.        VITALS: Blood pressure 122/70, pulse 54, temperature 97.1 F (36.2 C), temperature source Oral,  resp. rate 16, height 5\' 6"  (1.676 m), weight 88.2 kg (194 lb 7.1 oz), SpO2 96.00%.  PHYSICAL EXAM: General appearance: alert and no distress Neck: no adenopathy, no carotid bruit, no JVD, supple, symmetrical, trachea midline and thyroid not enlarged, symmetric, no tenderness/mass/nodules Lungs: clear to auscultation bilaterally Heart: regular rate and rhythm, S1, S2 normal, no murmur, click, rub or gallop Abdomen: soft, non-tender; bowel sounds normal; no masses,  no organomegaly Extremities: extremities normal, atraumatic, no cyanosis or edema Pulses: 2+ and symmetric Skin: Skin color, texture, turgor normal. No rashes or lesions Neurologic: Grossly normal  LABS: Results for orders placed during  the hospital encounter of 12/14/11 (from the past 48 hour(s))  CBC     Status: Normal   Collection Time   12/14/11  2:00 AM      Component Value Range Comment   WBC 8.6  4.0 - 10.5 K/uL    RBC 4.26  3.87 - 5.11 MIL/uL    Hemoglobin 12.9  12.0 - 15.0 g/dL    HCT 16.1  09.6 - 04.5 %    MCV 91.3  78.0 - 100.0 fL    MCH 30.3  26.0 - 34.0 pg    MCHC 33.2  30.0 - 36.0 g/dL    RDW 40.9  81.1 - 91.4 %    Platelets 258  150 - 400 K/uL   BASIC METABOLIC PANEL     Status: Abnormal   Collection Time   12/14/11  2:00 AM      Component Value Range Comment   Sodium 139  135 - 145 mEq/L    Potassium 4.2  3.5 - 5.1 mEq/L    Chloride 101  96 - 112 mEq/L    CO2 28  19 - 32 mEq/L    Glucose, Bld 168 (*) 70 - 99 mg/dL    BUN 14  6 - 23 mg/dL    Creatinine, Ser 7.82  0.50 - 1.10 mg/dL    Calcium 95.6  8.4 - 10.5 mg/dL    GFR calc non Af Amer 56 (*) >90 mL/min    GFR calc Af Amer 65 (*) >90 mL/min   POCT I-STAT TROPONIN I     Status: Normal   Collection Time   12/14/11  2:07 AM      Component Value Range Comment   Troponin i, poc 0.08  0.00 - 0.08 ng/mL    Comment 3            APTT     Status: Normal   Collection Time   12/14/11  2:53 AM      Component Value Range Comment   aPTT 31  24 - 37 seconds   PROTIME-INR     Status: Normal   Collection Time   12/14/11  2:53 AM      Component Value Range Comment   Prothrombin Time 13.4  11.6 - 15.2 seconds    INR 1.00  0.00 - 1.49   LIPID PANEL     Status: Normal   Collection Time   12/14/11  6:15 AM      Component Value Range Comment   Cholesterol 146  0 - 200 mg/dL    Triglycerides 90  <213 mg/dL    HDL 57  >08 mg/dL    Total CHOL/HDL Ratio 2.6      VLDL 18  0 - 40 mg/dL    LDL Cholesterol 71  0 - 99 mg/dL   BASIC METABOLIC PANEL  Status: Abnormal   Collection Time   12/14/11  8:12 AM      Component Value Range Comment   Sodium 136  135 - 145 mEq/L    Potassium 3.3 (*) 3.5 - 5.1 mEq/L DELTA CHECK NOTED   Chloride 102  96 - 112 mEq/L    CO2  24  19 - 32 mEq/L    Glucose, Bld 214 (*) 70 - 99 mg/dL    BUN 15  6 - 23 mg/dL    Creatinine, Ser 1.61  0.50 - 1.10 mg/dL    Calcium 9.4  8.4 - 09.6 mg/dL    GFR calc non Af Amer 67 (*) >90 mL/min    GFR calc Af Amer 78 (*) >90 mL/min   CBC     Status: Abnormal   Collection Time   12/14/11  8:12 AM      Component Value Range Comment   WBC 8.0  4.0 - 10.5 K/uL    RBC 3.88  3.87 - 5.11 MIL/uL    Hemoglobin 11.8 (*) 12.0 - 15.0 g/dL    HCT 04.5 (*) 40.9 - 46.0 %    MCV 89.7  78.0 - 100.0 fL    MCH 30.4  26.0 - 34.0 pg    MCHC 33.9  30.0 - 36.0 g/dL    RDW 81.1  91.4 - 78.2 %    Platelets 262  150 - 400 K/uL   TROPONIN I     Status: Normal   Collection Time   12/14/11  8:12 AM      Component Value Range Comment   Troponin I <0.30  <0.30 ng/mL     IMAGING: No results found.  DIAGNOSES: Principal Problem:  *Chest pain Active Problems:  Bradycardia  Paroxysmal atrial fibrillation  Hypertension  Hyperlipidemia   IMPRESSION: 1. Unspecified chest pain - possibly related to URI, however, angina should be considered in light of recent p-afib and history of CAD with stents in the past.  RECOMMENDATION: 1. This is not clearly angina to me, however, I have some concern given her history and recent a-fib. She has been totally asymptomatic for many years, but all of a sudden is having these events. Ischemia could precipitate a-fib. She was scheduled for an outpatient stress test in our office in October.  Currently she is wearing a monitor but has had no further sensation of arrythmia, so I doubt the symptoms are related to this. She appears to have a URI, ?viral or possibly pneumonia? Lungs are fairly clear to my ear, no egophony or dullness. I would repeat a CXR, though, to evaluate for a pulmonary cause of her chest pain. She had bibasilar opacities on her CXR on 8/24 in the ER, thought to be atelactasis. She has ruled-out for MI by troponin. I feel that she could be evaluated safely as  an outpatient with a nuclear stress test next week in our office.  I would recommend adding long-acting imdur 30 mg daily to replace her nitroglycerin patch. I would keep other medicines the same.   Thank you for consulting [place]. We will arrange follow-up in our office.  Time Spent Directly with Patient: 15 minutes  Chrystie Nose, MD, [facility] Attending Cardiologist [facility]  Trenyce Loera C 12/14/2011, 10:25 AM

## 2011-12-14 NOTE — Discharge Summary (Signed)
Physician Discharge Summary  Patient ID: Debbie [name] MRN: [id number] DOB/AGE: 1945-07-24 66 y.o.  Admit date: 12/14/2011 Discharge date: 12/14/2011  Admission Diagnoses: chest pain  Discharge Diagnoses:  Principal Problem:  *Chest pain Active Problems:  Bradycardia  Paroxysmal atrial fibrillation  Hypertension  Hyperlipidemia   Discharged Condition: stable  Hospital Course: Patient is a 66 yo F with prior MI, HTN, HLD who presented with left sided chest pain. She was recently evaluated for new onset A.fib and has been seen by Dr. Tresa Endo (Cardiology) and currently wearing an event monitor. Given her high risk, as well as recent A.fib, now presenting with bradycardia, patient was admitted for cardiac rule out. She was kept on telemetry with no acute events. EKG showed sinus bradycardia with no ST changes. Troponins were negative x3. Dr. Rennis Golden with [facility] did consult on patient while she was here. He felt she would be appropriate for continued outpatient work up. He did recommend adding Imdur 30mg  to her regimen, which was started at time of discharge. Patient was discharged home pain free, with close outpatient follow up.  Consults: cardiology  Significant Diagnostic Studies:  Troponin neg x2, plus one negative POCT Troponin CXR was obtained with no acute finding; awaiting official read.  Treatments: None  Discharge Exam: Blood pressure 109/56, pulse 52, temperature 98.6 F (37 C), temperature source Oral, resp. rate 18, height 5\' 6"  (1.676 m), weight 194 lb 7.1 oz (88.2 kg), SpO2 96.00%.  Disposition: 01-Home or Self Care  Discharge Orders    Future Orders Please Complete By Expires   Diet - low sodium heart healthy      Increase activity slowly        Medication List  As of 12/14/2011  4:28 PM   TAKE these medications         amLODipine-atorvastatin 10-80 MG per tablet   Commonly known as: CADUET   Take 1 tablet by mouth every evening.     aspirin EC 325 MG tablet   Take 325 mg by mouth daily.      ezetimibe 10 MG tablet   Commonly known as: ZETIA   Take 10 mg by mouth daily.      hydrochlorothiazide 25 MG tablet   Commonly known as: HYDRODIURIL   Take 25 mg by mouth daily.      isosorbide mononitrate 30 MG 24 hr tablet   Commonly known as: IMDUR   Take 1 tablet (30 mg total) by mouth daily.      metoprolol succinate 50 MG 24 hr tablet   Commonly known as: TOPROL-XL   Take 50 mg by mouth daily. Take with or immediately following a meal.      ramipril 10 MG capsule   Commonly known as: ALTACE   Take 10 mg by mouth daily.           Follow-up Information    Follow up with Lennette Bihari, MD. [facility] will call you with follow up)    Contact information:   [address] [place] [address] [phone]       Follow up with [facility]. (Please call next week to make a hospital follow up appointment. [phone])    Contact information:   [address] [place] [address]         Recommendation for Follow up: - Follow up heart rate and chest pain - Started on Imdur 30mg  at discharge - Please  consider outpatient stress test (patient states this has been scheduled already.)  Signed: [place], AMBER 12/14/2011, 4:28 PM

## 2011-12-14 NOTE — H&P (Signed)
[facility] Attending Admission Note: Debbie Levy MD [phone] pager office [phone] I  have seen and examined this patient, reviewed their chart. I have discussed this patient with the resident. I agree with the resident's findings, assessment and care plan. I am concerned she was awakened with chest pain. We have asked [facility] to consult.

## 2011-12-14 NOTE — ED Provider Notes (Signed)
History     CSN: [id number]  Arrival date & time 12/14/11  0131   First MD Initiated Contact with Patient 12/14/11 [phone]      Chief Complaint  Patient presents with  . Chest Pain    (Consider location/radiation/quality/duration/timing/severity/associated sxs/prior treatment) HPI Comments: 66 year old female with a history of coronary artery disease status post stenting x3, hypertension, , history of myocardial infarction and hypercholesterolemia who continues to smoke cigarettes. She was recently seen in the emergency department for atrial fibrillation and started on rate control therapy, she currently takes metoprolol. She was at home this evening and developed acute onset of left-sided chest pain which she describes as a squeezing, does not radiate and is not associated with shortness of breath nausea or diaphoresis. This chest pain is intermittent, lasts for 5-10 minutes and then resolve spontaneously. Her cardiologist is Dr. Nicholaus Bloom.  Patient is a 66 y.o. female presenting with chest pain. The history is provided by the patient, the spouse and medical records.  Chest Pain     Past Medical History  Diagnosis Date  . Myocardial infarction   . Stroke   . Hypertension   . Hypercholesteremia     History reviewed. No pertinent past surgical history.  History reviewed. No pertinent family history.  History  Substance Use Topics  . Smoking status: Current Everyday Smoker    Types: Cigarettes  . Smokeless tobacco: Not on file  . Alcohol Use: No    OB History    Grav Para Term Preterm Abortions TAB SAB Ect Mult Living                  Review of Systems  Cardiovascular: Positive for chest pain.  All other systems reviewed and are negative.    Allergies  Review of patient's allergies indicates no known allergies.  Home Medications   Current Outpatient Rx  Name Route Sig Dispense Refill  . AMLODIPINE-ATORVASTATIN 10-80 MG PO TABS Oral Take 1 tablet by mouth every  evening.    . ASPIRIN EC 325 MG PO TBEC Oral Take 325 mg by mouth daily.    Marland Kitchen EZETIMIBE 10 MG PO TABS Oral Take 10 mg by mouth daily.    Marland Kitchen HYDROCHLOROTHIAZIDE 25 MG PO TABS Oral Take 25 mg by mouth daily.    Marland Kitchen METOPROLOL SUCCINATE ER 50 MG PO TB24 Oral Take 50 mg by mouth daily. Take with or immediately following a meal.    . RAMIPRIL 10 MG PO CAPS Oral Take 10 mg by mouth daily.      BP 124/56  Pulse 50  Temp 98.8 F (37.1 C) (Oral)  Resp 18  SpO2 97%  Physical Exam  Nursing note and vitals reviewed. Constitutional: She appears well-developed and well-nourished. No distress.  HENT:  Head: Normocephalic and atraumatic.  Mouth/Throat: Oropharynx is clear and moist. No oropharyngeal exudate.  Eyes: Conjunctivae and EOM are normal. Pupils are equal, round, and reactive to light. Right eye exhibits no discharge. Left eye exhibits no discharge. No scleral icterus.  Neck: Normal range of motion. Neck supple. No JVD present. No thyromegaly present.  Cardiovascular: Regular rhythm, normal heart sounds and intact distal pulses.  Exam reveals no gallop and no friction rub.   No murmur heard.      Bradycardia, strong peripheral pulses at the radial arteries, normal capillary refill, no JVD  Pulmonary/Chest: Effort normal and breath sounds normal. No respiratory distress. She has no wheezes. She has no rales.  Abdominal: Soft. Bowel sounds are  normal. She exhibits no distension and no mass. There is no tenderness.  Musculoskeletal: Normal range of motion. She exhibits no edema and no tenderness.  Lymphadenopathy:    She has no cervical adenopathy.  Neurological: She is alert. Coordination normal.  Skin: Skin is warm and dry. No rash noted. No erythema.  Psychiatric: She has a normal mood and affect. Her behavior is normal.    ED Course  Procedures (including critical care time)  Labs Reviewed  BASIC METABOLIC PANEL - Abnormal; Notable for the following:    Glucose, Bld 168 (*)     GFR  calc non Af Amer 56 (*)     GFR calc Af Amer 65 (*)     All other components within normal limits  CBC  POCT I-STAT TROPONIN I  APTT  PROTIME-INR   No results found.   1. Chest pain       MDM   on exam the patient has a bradycardia but there are no other acute findings. She is comfortable at this time, aspirin and nitroglycerin paste given, EKG shows sinus bradycardia, troponin is negative, will admit for rule out.  ED ECG REPORT  I personally interpreted this EKG   Date: 12/14/2011   Rate: 49  Rhythm: sinus bradycardia  QRS Axis: normal  Intervals: normal  ST/T Wave abnormalities: normal  Conduction Disutrbances:none  Narrative Interpretation:   Old EKG Reviewed: Compared with 12/07/2011, rate is slower, sinus bradycardia now present  Care discussed with the hospitalist who will admit for observation. Troponin negative       Vida Roller, MD 12/14/11 [phone]

## 2011-12-14 NOTE — H&P (Signed)
Debbie [name] is an 66 y.o. female.    PCP: None (has been seen by [facility] previously, >3 years since last visit) Cardiologist: Dr. Tresa Endo, [facility]  Chief Complaint: chest pain HPI: 66 yo F w/ HTN, HLD, s/p cath 10-15 years ago for prior MI presents with chest pain.  Pain is throbbing, left sided. Woke up around 1am and had episode of throbbing that lasted ~5 min.  Has had 2 other episodes, 1 lasting 3 min, the other only lasting 2 minutes.  Most recent episode was 2 hours ago while in the ED waiting room.  Pain does not radiate.  No associated dizziness, SOB, weakness, diaphroesis, or vomiting.  Did have some nausea/dry heaving following the episodes, but this has since resolved.  Patient does report having a nonproductive cough the past 2-3 days.  No fevers/chills. No SOB, minimal rhinorrhea, no sore throat.  No pain with inspiration. No swelling or tenderness in BLE.   Pt reports recent ED visit (12/07/11) for newly diagnosed a fib (in sinus rhythm today) and states that she was seen by [facility] last week in follow up.  She had an ECHO done and was set up with what appears to be a loop recorder/cardiac event monitor.  While in the ED tonight, she was given ASA 324mg , nitro paste, and had an EKG done which showed sinus bradycardia.  Past Medical History  Diagnosis Date  . Myocardial infarction   . Hypertension   . Hypercholesteremia   . Stroke 1998    History reviewed. No pertinent past surgical history.  Family History  Problem Relation Age of Onset  . Diabetes type II Sister   . Heart disease Sister    Social History:  reports that she has quit smoking. Her smoking use included Cigarettes. She quit after 60 years of use. She quit smokeless tobacco use 3 days ago. She reports that she does not drink alcohol or use illicit drugs.  Allergies: No Known Allergies   (Not in a hospital admission)  Results for orders placed during the hospital encounter of 12/14/11 (from the past 48 hour(s))  CBC      Status: Normal   Collection Time   12/14/11  2:00 AM      Component Value Range Comment   WBC 8.6  4.0 - 10.5 K/uL    RBC 4.26  3.87 - 5.11 MIL/uL    Hemoglobin 12.9  12.0 - 15.0 g/dL    HCT 16.1  09.6 - 04.5 %    MCV 91.3  78.0 - 100.0 fL    MCH 30.3  26.0 - 34.0 pg    MCHC 33.2  30.0 - 36.0 g/dL    RDW 40.9  81.1 - 91.4 %    Platelets 258  150 - 400 K/uL   BASIC METABOLIC PANEL     Status: Abnormal   Collection Time   12/14/11  2:00 AM      Component Value Range Comment   Sodium 139  135 - 145 mEq/L    Potassium 4.2  3.5 - 5.1 mEq/L    Chloride 101  96 - 112 mEq/L    CO2 28  19 - 32 mEq/L    Glucose, Bld 168 (*) 70 - 99 mg/dL    BUN 14  6 - 23 mg/dL    Creatinine, Ser 7.82  0.50 - 1.10 mg/dL    Calcium 95.6  8.4 - 10.5 mg/dL    GFR calc non Af Amer 56 (*) >90  mL/min    GFR calc Af Amer 65 (*) >90 mL/min   POCT I-STAT TROPONIN I     Status: Normal   Collection Time   12/14/11  2:07 AM      Component Value Range Comment   Troponin i, poc 0.08  0.00 - 0.08 ng/mL    Comment 3            APTT     Status: Normal   Collection Time   12/14/11  2:53 AM      Component Value Range Comment   aPTT 31  24 - 37 seconds   PROTIME-INR     Status: Normal   Collection Time   12/14/11  2:53 AM      Component Value Range Comment   Prothrombin Time 13.4  11.6 - 15.2 seconds    INR 1.00  0.00 - 1.49    No results found.  Review of Systems  Constitutional: Negative for fever and chills.  HENT: Negative for congestion and sore throat.   Eyes: Negative for blurred vision.  Respiratory: Positive for cough. Negative for hemoptysis, sputum production, shortness of breath and wheezing.   Cardiovascular: Positive for chest pain. Negative for palpitations, claudication and leg swelling.  Gastrointestinal: Positive for nausea. Negative for vomiting, abdominal pain, diarrhea and constipation.  Genitourinary: Negative for dysuria.  Musculoskeletal: Negative for myalgias and falls.  Skin:  Negative for rash.  Neurological: Negative for dizziness, tingling, sensory change, focal weakness, loss of consciousness, weakness and headaches.    Blood pressure 124/56, pulse 50, temperature 98.8 F (37.1 C), temperature source Oral, resp. rate 18, SpO2 97.00%. Physical Exam  Constitutional: She is oriented to person, place, and time. She appears well-developed and well-nourished. No distress.  HENT:  Head: Normocephalic and atraumatic.  Nose: Nose normal.  Mouth/Throat: Oropharynx is clear and moist. No oropharyngeal exudate.  Eyes: Conjunctivae and EOM are normal. Pupils are equal, round, and reactive to light.  Neck: Normal range of motion.  Cardiovascular: Regular rhythm and intact distal pulses.  Bradycardia present.   No murmur heard. Respiratory: Effort normal and breath sounds normal. No respiratory distress. She has no wheezes. She has no rales. She exhibits tenderness. She exhibits no deformity.    GI: Soft. Bowel sounds are normal. She exhibits no distension. There is no tenderness.  Musculoskeletal: Normal range of motion. She exhibits no edema.  Lymphadenopathy:    She has no cervical adenopathy.  Neurological: She is alert and oriented to person, place, and time. No cranial nerve deficit.  Skin: Skin is warm and dry. No rash noted. She is not diaphoretic. No erythema.     Assessment/Plan 66 yo F w/ HTN, HLD, s/p cath 10-15 years ago for prior MI presents with chest pain.  1. Chest pain:  Ddx includes cardiac vs MSK vs pulm vs PE vs other - Admit to telemetry - Cycle cardiac enzymes to r/o ACS in pt with multiple RF (HTN, HLD, h/o MI s/p stents, recent a fib); initial POC troponin was negative  - Recheck EKG to ensure still sinus rhythm  - Risk stratification (A1c, FLP); will not recheck TSH since it was normal <1 week ago - Most likely this is 2/2 MSK pain with reproducible chest wall pain, currently pain free, motrin/tylenol PRN - Consider CXR since pt has been  having nonproductive cough; could also consider d-dimer - Call [facility] in AM to at least find out results of ECHO and see if they would like to  consult on pt in hospital   2. Bradycardia: Sinus rhythm in 50's on admission - Hold BB - Per pt report, BB dose had recently been decreased by [facility] last week at a visit; question if she was bradycardic at that time  3. HTN: - Continue home amlodipine, ramipril; hold BB  4 HLD: - Continue home zetia - Check FLP (pt reports she had blood work done at the cardiology office ~3 months ago, but we are unable to see these records)  FEN/GI: heart healthy diet, SLIV Ppx: SQ heparin Dispo: admit to tele, d/c pending completion of chest pain rule out  Betsi Crespi 12/14/2011, 4:14 AM

## 2011-12-14 NOTE — ED Notes (Addendum)
C/o chest pain described as squeezing  On the left side of chest denies radiation denies SOB, pain began 10 minutes ago and lasted only 5 minutes. Pt took one aspirin and is now pain free.  She has a holter monitor on for Dr. Tresa Endo recording symptoms.

## 2011-12-14 NOTE — ED Notes (Signed)
Admitting MD at bedside.

## 2011-12-15 NOTE — Discharge Summary (Signed)
Family Medicine Teaching Service  Discharge Note : Attending Denny Levy MD Pager [phone] Office [phone] I have seen and examined this patient, reviewed their chart and discussed discharge planning wit the resident at the time of discharge. I agree with the discharge plan as above. CXR no new findings---Cardiology recommended d/c with outpatient work up next week. As her CE are negative and chest pain is gone, we can d/c.

## 2012-01-08 DIAGNOSIS — Z9289 Personal history of other medical treatment: Secondary | ICD-10-CM

## 2012-01-08 HISTORY — DX: Personal history of other medical treatment: Z92.89

## 2012-09-22 ENCOUNTER — Encounter: Payer: Self-pay | Admitting: Cardiology

## 2012-09-22 ENCOUNTER — Ambulatory Visit (INDEPENDENT_AMBULATORY_CARE_PROVIDER_SITE_OTHER): Payer: PRIVATE HEALTH INSURANCE | Admitting: Cardiovascular Disease

## 2012-09-22 ENCOUNTER — Encounter: Payer: Self-pay | Admitting: Cardiovascular Disease

## 2012-09-22 VITALS — BP 140/78 | HR 48 | Ht 65.5 in | Wt 202.0 lb

## 2012-09-22 DIAGNOSIS — E785 Hyperlipidemia, unspecified: Secondary | ICD-10-CM

## 2012-09-22 DIAGNOSIS — I10 Essential (primary) hypertension: Secondary | ICD-10-CM

## 2012-09-22 DIAGNOSIS — I2510 Atherosclerotic heart disease of native coronary artery without angina pectoris: Secondary | ICD-10-CM

## 2012-09-22 DIAGNOSIS — I4891 Unspecified atrial fibrillation: Secondary | ICD-10-CM

## 2012-09-22 DIAGNOSIS — Z9861 Coronary angioplasty status: Secondary | ICD-10-CM | POA: Insufficient documentation

## 2012-09-22 DIAGNOSIS — Z8673 Personal history of transient ischemic attack (TIA), and cerebral infarction without residual deficits: Secondary | ICD-10-CM

## 2012-09-22 DIAGNOSIS — M79609 Pain in unspecified limb: Secondary | ICD-10-CM

## 2012-09-22 DIAGNOSIS — I480 Paroxysmal atrial fibrillation: Secondary | ICD-10-CM

## 2012-09-22 DIAGNOSIS — R5381 Other malaise: Secondary | ICD-10-CM

## 2012-09-22 DIAGNOSIS — R5383 Other fatigue: Secondary | ICD-10-CM

## 2012-09-22 NOTE — Patient Instructions (Signed)
Your physician has requested that you have a lower extremity arterial exercise duplex. During this test, exercise and ultrasound are used to evaluate arterial blood flow in the legs. Allow one hour for this exam. There are no restrictions or special instructions.  Your physician recommends that you return for lab work. It will need to be fasting.  Your physician recommends that you schedule a follow-up appointment in: 3-4 months.

## 2012-09-24 ENCOUNTER — Encounter (HOSPITAL_COMMUNITY): Payer: Self-pay | Admitting: Cardiovascular Disease

## 2012-09-29 LAB — COMPREHENSIVE METABOLIC PANEL
ALT: 21 U/L (ref 0–35)
AST: 16 U/L (ref 0–37)
Albumin: 4.3 g/dL (ref 3.5–5.2)
Alkaline Phosphatase: 81 U/L (ref 39–117)
Glucose, Bld: 133 mg/dL — ABNORMAL HIGH (ref 70–99)
Potassium: 4.1 mEq/L (ref 3.5–5.3)
Sodium: 138 mEq/L (ref 135–145)
Total Protein: 7.2 g/dL (ref 6.0–8.3)

## 2012-09-29 LAB — LIPID PANEL
LDL Cholesterol: 73 mg/dL (ref 0–99)
Total CHOL/HDL Ratio: 2.6 Ratio
VLDL: 21 mg/dL (ref 0–40)

## 2012-09-29 LAB — CBC
Hemoglobin: 13.7 g/dL (ref 12.0–15.0)
MCHC: 33.7 g/dL (ref 30.0–36.0)
Platelets: 360 10*3/uL (ref 150–400)

## 2012-10-02 ENCOUNTER — Ambulatory Visit (HOSPITAL_COMMUNITY)
Admission: RE | Admit: 2012-10-02 | Discharge: 2012-10-02 | Disposition: A | Discharge status: Home / Self Care | Payer: PRIVATE HEALTH INSURANCE | Source: Ambulatory Visit | Attending: Internal Medicine | Admitting: Internal Medicine

## 2012-10-02 DIAGNOSIS — I2510 Atherosclerotic heart disease of native coronary artery without angina pectoris: Secondary | ICD-10-CM

## 2012-10-02 DIAGNOSIS — M79609 Pain in unspecified limb: Secondary | ICD-10-CM

## 2012-10-02 NOTE — Progress Notes (Signed)
Arterial Duplex Completed. Debbie [name]  

## 2012-10-10 ENCOUNTER — Encounter: Payer: Self-pay | Admitting: Cardiovascular Disease

## 2012-10-10 NOTE — Progress Notes (Signed)
Patient ID: Debbie [name], female   DOB: [birth year], 67 y.o.   MRN: [id number]     HPI: Debbie [name], is a 67 y.o. female who presents to the office today for a six-month followup evaluation. 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 She had 3 stents placed to her right current artery. Subsequent catheterization in 2006 showed all 3 stents widely patent. Additionally, she has a history of hypertension as well as hyperlipidemia. She also has a history of atrial tachycardia. A nuclear perfusion study in September 2013 showed minimal anterior thinning not felt to be significant.  Over the past 6 months, Debbie [name] has continued to remain fairly stable.  Recently, she has noticed pain in her legs particularly with walking suggestive of possible claudication. She denies recent chest pressure. She denies shortness of breath. She presents for evaluation.  Past Medical History  Diagnosis Date  . CAD (coronary artery disease) Aug 2001    RCA stent X3, patent '06. Nuc low risk 9/13  . Hypertension   . Hypercholesteremia   . Stroke 1998  . PAF (paroxysmal atrial fibrillation) Aug 2013    SSS component with some bradycardia    Past Surgical History  Procedure Laterality Date  . Coronary angioplasty with stent placement  Aug 2001    X 3  . Coronary angiogram  May 2006    patent stents    No Known Allergies  Current Outpatient Prescriptions  Medication Sig Dispense Refill  . amLODipine-atorvastatin (CADUET) 10-80 MG per tablet Take 1 tablet by mouth every evening.      Marland Kitchen aspirin EC 325 MG tablet Take 325 mg by mouth daily.      Marland Kitchen ezetimibe (ZETIA) 10 MG tablet Take 10 mg by mouth daily.      . hydrochlorothiazide (HYDRODIURIL) 25 MG tablet Take 25 mg by mouth daily.      . metoprolol succinate (TOPROL-XL) 50 MG 24 hr tablet Take 50 mg by mouth daily. Take with or immediately following a  meal.      . ramipril (ALTACE) 10 MG capsule Take 10 mg by mouth daily.       No current facility-administered medications for this visit.    Socially she denies tobacco or alcohol. She does not routinely exercise.  ROS is negative for fevers, chills or night sweats.  She denies visual symptoms. She denies any significant weight change. She denies chest pressure. She denies PND or orthopnea. She denies any recent palpitations. She denies bleeding melena or hematochezia. She does note pain in her legs with walking. She denies significant leg swelling as long as she takes her hydrochlorothiazide. Other system review is negative.  PE BP 140/78  Pulse 48  Ht 5' 5.5" (1.664 m)  Wt 202 lb (91.627 kg)  BMI 33.09 kg/m2 Repeat BP 12/80. General: Alert, oriented, no distress.  Skin: normal turgor, no rashes HEENT: Normocephalic, atraumatic. Pupils round and reactive; sclera anicteric;no lid lag.  Nose without nasal septal hypertrophy Mouth/Parynx benign; Mallinpatti scale 3 Neck: No JVD, no carotid briuts Lungs: clear to ausculatation and percussion; no wheezing or rales Heart: RRR, s1 s2 normal; no ectopy. Faint 1/6 systolic murmur. Abdomen: Mild central adiposity. soft, nontender; no hepatosplenomehaly, BS+; abdominal aorta nontender and not dilated by palpation. Pulses 2+ Extremities: no clubbing cyanosis or edema, Homan's sign negative  Neurologic: grossly nonfocal  ECG: Sinus bradycardia 48  beats per minute. Intervals normal  LABS:  BMET    Component Value Date/Time   NA 138 09/29/2012 0838   K 4.1 09/29/2012 0838   CL 106 09/29/2012 0838   CO2 22 09/29/2012 0838   GLUCOSE 133* 09/29/2012 0838   BUN 21 09/29/2012 0838   CREATININE 1.06 09/29/2012 0838   CREATININE 0.87 12/14/2011 0812   CALCIUM 9.9 09/29/2012 0838   GFRNONAA 67* 12/14/2011 0812   GFRAA 78* 12/14/2011 0812     Hepatic Function Panel     Component Value Date/Time   PROT 7.2 09/29/2012 0838   ALBUMIN 4.3 09/29/2012  0838   AST 16 09/29/2012 0838   ALT 21 09/29/2012 0838   ALKPHOS 81 09/29/2012 0838   BILITOT 0.5 09/29/2012 0838     CBC    Component Value Date/Time   WBC 8.3 09/29/2012 0838   RBC 4.52 09/29/2012 0838   HGB 13.7 09/29/2012 0838   HCT 40.6 09/29/2012 0838   PLT 360 09/29/2012 0838   MCV 89.8 09/29/2012 0838   MCH 30.3 09/29/2012 0838   MCHC 33.7 09/29/2012 0838   RDW 14.2 09/29/2012 0838     BNP No results found for this basename: probnp    Lipid Panel     Component Value Date/Time   CHOL 151 09/29/2012 0838   TRIG 105 09/29/2012 0838   HDL 57 09/29/2012 0838   CHOLHDL 2.6 09/29/2012 0838   VLDL 21 09/29/2012 0838   LDLCALC 73 09/29/2012 0838     RADIOLOGY: No results found.    ASSESSMENT AND PLAN: Debbie [name] is to do fairly well with reference to her known coronary artery disease. She's not having any anginal symptoms she has noted pain in her legs with walking which may be suggestive of possible claudication symptoms. I am scheduling her for a lower arterial Doppler study for further evaluation. I also recommended she take her pulse. Her pulse is below as the she will reduce her Toprol to 25 mg. She's not aware of any breakthrough atrial tachycardia per hour recheck laboratory in the fasting state consisting of a CBC. CMet, lipid panel and TSH. I will see her in 3 months for cardiology followup evaluation.    Lennette Bihari, MD, [facility]  10/10/2012 10:16 AM

## 2012-10-19 ENCOUNTER — Telehealth: Payer: Self-pay | Admitting: Cardiovascular Disease

## 2012-10-19 NOTE — Telephone Encounter (Signed)
Pt called asking if someone could call her back about her test results

## 2012-10-27 NOTE — Progress Notes (Signed)
Quick Note:    Patient notified of results.  ______

## 2012-11-03 ENCOUNTER — Encounter: Payer: Self-pay | Admitting: *Deleted

## 2012-11-09 ENCOUNTER — Other Ambulatory Visit: Payer: Self-pay | Admitting: *Deleted

## 2012-11-09 MED ORDER — AMLODIPINE-ATORVASTATIN 10-80 MG PO TABS: 1.0000 | ORAL_TABLET | Freq: Every evening | ORAL | Status: DC

## 2012-11-09 MED ORDER — EZETIMIBE 10 MG PO TABS: 10.0000 mg | ORAL_TABLET | Freq: Every day | ORAL | Status: DC

## 2012-11-09 MED ORDER — HYDROCHLOROTHIAZIDE 25 MG PO TABS: 25.0000 mg | ORAL_TABLET | Freq: Every day | ORAL | Status: DC

## 2012-11-09 MED ORDER — RAMIPRIL 10 MG PO CAPS: 10.0000 mg | ORAL_CAPSULE | Freq: Every day | ORAL | Status: DC

## 2012-11-09 NOTE — Telephone Encounter (Signed)
Rx was sent to pharmacy electronically. 

## 2012-12-27 ENCOUNTER — Encounter: Payer: Self-pay | Admitting: *Deleted

## 2012-12-28 ENCOUNTER — Encounter: Payer: Self-pay | Admitting: Cardiovascular Disease

## 2012-12-29 ENCOUNTER — Ambulatory Visit (INDEPENDENT_AMBULATORY_CARE_PROVIDER_SITE_OTHER): Payer: PRIVATE HEALTH INSURANCE | Admitting: Cardiovascular Disease

## 2012-12-29 ENCOUNTER — Encounter: Payer: Self-pay | Admitting: Cardiovascular Disease

## 2012-12-29 VITALS — BP 112/60 | HR 47 | Ht 65.0 in | Wt 199.4 lb

## 2012-12-29 DIAGNOSIS — I480 Paroxysmal atrial fibrillation: Secondary | ICD-10-CM

## 2012-12-29 DIAGNOSIS — I10 Essential (primary) hypertension: Secondary | ICD-10-CM

## 2012-12-29 DIAGNOSIS — E119 Type 2 diabetes mellitus without complications: Secondary | ICD-10-CM | POA: Insufficient documentation

## 2012-12-29 DIAGNOSIS — I2510 Atherosclerotic heart disease of native coronary artery without angina pectoris: Secondary | ICD-10-CM

## 2012-12-29 DIAGNOSIS — I4891 Unspecified atrial fibrillation: Secondary | ICD-10-CM

## 2012-12-29 DIAGNOSIS — E785 Hyperlipidemia, unspecified: Secondary | ICD-10-CM

## 2012-12-29 NOTE — Progress Notes (Signed)
Patient ID: Debbie [name], female   DOB: 08/27/1945, 67 y.o.   MRN: [id number]     HPI: Debbie [name], is a 67 y.o. female who presents to the office today for a  followup evaluation. I last saw her in June 2014.  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 She had 3 stents placed to her right current artery. Subsequent catheterization in 2006 showed all 3 stents widely patent. Additionally, she has a history of hypertension as well as hyperlipidemia. She also has a history of atrial tachycardia. A nuclear perfusion study in September 2013 showed minimal anterior thinning not felt to be significant.  Debbie [name] has continued to remain fairly stable. As I last saw her, she underwent laboratory which showed a hemoglobin of 13.7 hematocrit 40.6. Lecture lites were normal with the exception of mild glucose elevation of 133. TSH was normal at 2.42. Her cholesterol was excellent at 151 with an LDL of 73 HDL 57 triglycerides 105.  Because of mild possible claudication symptoms her lower extremity, she did undergo a lower sugar arterial Doppler study which suggested mild arterial insufficiency with ABIs of 0.83 bilaterally. She denies any chest pain. She denies any dizziness. She denies any breakthrough tachycardia or awareness of atrial fibrillation. She presents for a three-month evaluation.   Past Medical History  Diagnosis Date  . CAD (coronary artery disease) Aug 2001    RCA stent X3, patent '06. Nuc low risk 9/13  . Hypertension   . Hypercholesteremia   . Stroke 1998  . PAF (paroxysmal atrial fibrillation) Aug 2013    SSS component with some bradycardia  . History of stress test 01/08/2012    Showed minimal anterior thinning not felt to be significant.  Marland Kitchen Hx of echocardiogram 12/12/2011    EF>55%    Past Surgical History  Procedure Laterality Date  . Coronary angioplasty with stent  placement  Aug 2001    X 3  . Coronary angiogram  May 2006    patent stents    No Known Allergies  Current Outpatient Prescriptions  Medication Sig Dispense Refill  . amLODipine-atorvastatin (CADUET) 10-80 MG per tablet Take 1 tablet by mouth every evening.  30 tablet  11  . aspirin EC 325 MG tablet Take 325 mg by mouth daily.      Marland Kitchen ezetimibe (ZETIA) 10 MG tablet Take 1 tablet (10 mg total) by mouth daily.  30 tablet  11  . hydrochlorothiazide (HYDRODIURIL) 25 MG tablet Take 1 tablet (25 mg total) by mouth daily.  30 tablet  11  . metoprolol succinate (TOPROL-XL) 50 MG 24 hr tablet Take 50 mg by mouth daily. Take with or immediately following a meal.      . ramipril (ALTACE) 10 MG capsule Take 1 capsule (10 mg total) by mouth daily.  30 capsule  11   No current facility-administered medications for this visit.    Socially she denies tobacco or alcohol. She does not routinely exercise.  ROS is negative for fevers, chills or night sweats.  She denies visual symptoms. She denies any significant weight change. She denies chest pressure. She denies PND or orthopnea. She denies any recent palpitations. She denies bleeding melena or hematochezia.  does note intermittent mild pain in her legs with walking. She denies significant leg swelling as long as she takes her hydrochlorothiazide. Other system review is negative.  PE  BP 112/60  Pulse 47  Ht 5\' 5"  (1.651 m)  Wt 199 lb 6.4 oz (90.447 kg)  BMI 33.18 kg/m2  Repeat BP 118/68. General: Alert, oriented, no distress.  Skin: normal turgor, no rashes HEENT: Normocephalic, atraumatic. Pupils round and reactive; sclera anicteric;no lid lag.  Nose without nasal septal hypertrophy Mouth/Parynx benign; Mallinpatti scale 3 Neck: No JVD, no carotid briuts Lungs: clear to ausculatation and percussion; no wheezing or rales Heart: RRR, s1 s2 normal; no ectopy. Faint 1/6 systolic murmur. Abdomen: Mild central adiposity. soft, nontender; no  hepatosplenomehaly, BS+; abdominal aorta nontender and not dilated by palpation. Pulses 2+ Extremities: no clubbing cyanosis or edema, Homan's sign negative  Neurologic: grossly nonfocal  ECG: Sinus bradycardia 48 beats per minute. Intervals normal  LABS:  BMET    Component Value Date/Time   NA 138 09/29/2012 0838   K 4.1 09/29/2012 0838   CL 106 09/29/2012 0838   CO2 22 09/29/2012 0838   GLUCOSE 133* 09/29/2012 0838   BUN 21 09/29/2012 0838   CREATININE 1.06 09/29/2012 0838   CREATININE 0.87 12/14/2011 0812   CALCIUM 9.9 09/29/2012 0838   GFRNONAA 67* 12/14/2011 0812   GFRAA 78* 12/14/2011 0812     Hepatic Function Panel     Component Value Date/Time   PROT 7.2 09/29/2012 0838   ALBUMIN 4.3 09/29/2012 0838   AST 16 09/29/2012 0838   ALT 21 09/29/2012 0838   ALKPHOS 81 09/29/2012 0838   BILITOT 0.5 09/29/2012 0838     CBC    Component Value Date/Time   WBC 8.3 09/29/2012 0838   RBC 4.52 09/29/2012 0838   HGB 13.7 09/29/2012 0838   HCT 40.6 09/29/2012 0838   PLT 360 09/29/2012 0838   MCV 89.8 09/29/2012 0838   MCH 30.3 09/29/2012 0838   MCHC 33.7 09/29/2012 0838   RDW 14.2 09/29/2012 0838     BNP No results found for this basename: probnp    Lipid Panel     Component Value Date/Time   CHOL 151 09/29/2012 0838   TRIG 105 09/29/2012 0838   HDL 57 09/29/2012 0838   CHOLHDL 2.6 09/29/2012 0838   VLDL 21 09/29/2012 0838   LDLCALC 73 09/29/2012 0838     RADIOLOGY: No results found.    ASSESSMENT AND PLAN: Debbie [name] is to do fairly well with reference to her known coronary artery disease. She's not having any anginal symptoms she has noted pain in her legs with walking. Her recent lower 70 duplex study suggested only mild arterial insufficiency accounting for some of her symptoms. I did review her labs in detail. Her resting pulse continues to be bradycardic. She is asymptomatic with reference to dizziness. I again suggested that if her pulse typically gets below 50 to reduce her  Toprol to 25 mg. She's not having any recurrent atrial tachycardia or atrial fibrillation. As long as she remains stable, I will see her in 6 months for cardiology reevaluation.   Lennette Bihari, MD, [facility]  12/29/2012 11:47 AM

## 2012-12-29 NOTE — Patient Instructions (Addendum)
Your physician recommends that you return for lab work today. Your physician recommends that you schedule a follow-up appointment in: 6 MONTHS

## 2012-12-30 LAB — HEMOGLOBIN A1C
Hgb A1c MFr Bld: 7 % — ABNORMAL HIGH (ref <5.7)
Mean Plasma Glucose: 154 mg/dL — ABNORMAL HIGH (ref <117)

## 2012-12-30 LAB — BASIC METABOLIC PANEL
Glucose, Bld: 128 mg/dL — ABNORMAL HIGH (ref 70–99)
Potassium: 4.2 mEq/L (ref 3.5–5.3)
Sodium: 138 mEq/L (ref 135–145)

## 2012-12-31 ENCOUNTER — Encounter: Payer: Self-pay | Admitting: Cardiovascular Disease

## 2012-12-31 LAB — INSULIN, FASTING: Insulin fasting, serum: 16 u[IU]/mL (ref 3–28)

## 2013-01-03 ENCOUNTER — Encounter: Payer: Self-pay | Admitting: *Deleted

## 2013-01-04 ENCOUNTER — Encounter: Payer: Self-pay | Admitting: *Deleted

## 2013-01-04 NOTE — Progress Notes (Signed)
Quick Note:  Note sent to patient ______ 

## 2013-05-19 ENCOUNTER — Other Ambulatory Visit: Payer: Self-pay

## 2013-05-19 DIAGNOSIS — Z1231 Encounter for screening mammogram for malignant neoplasm of breast: Secondary | ICD-10-CM

## 2013-06-09 ENCOUNTER — Ambulatory Visit: Payer: PRIVATE HEALTH INSURANCE

## 2013-06-23 ENCOUNTER — Other Ambulatory Visit: Payer: Self-pay

## 2013-06-23 MED ORDER — METOPROLOL SUCCINATE ER 50 MG PO TB24: 50.0000 mg | ORAL_TABLET | Freq: Every day | ORAL | Status: DC

## 2013-06-23 NOTE — Telephone Encounter (Signed)
Rx was sent to pharmacy electronically. 

## 2013-06-28 ENCOUNTER — Other Ambulatory Visit: Payer: Self-pay | Admitting: Podiatry

## 2013-07-08 ENCOUNTER — Encounter: Payer: Self-pay | Admitting: Cardiovascular Disease

## 2013-07-08 ENCOUNTER — Ambulatory Visit (INDEPENDENT_AMBULATORY_CARE_PROVIDER_SITE_OTHER): Payer: 59 | Admitting: Cardiovascular Disease

## 2013-07-08 VITALS — BP 120/72 | HR 55 | Ht 65.5 in | Wt 201.1 lb

## 2013-07-08 DIAGNOSIS — E119 Type 2 diabetes mellitus without complications: Secondary | ICD-10-CM

## 2013-07-08 DIAGNOSIS — E785 Hyperlipidemia, unspecified: Secondary | ICD-10-CM

## 2013-07-08 DIAGNOSIS — I10 Essential (primary) hypertension: Secondary | ICD-10-CM

## 2013-07-08 DIAGNOSIS — I2510 Atherosclerotic heart disease of native coronary artery without angina pectoris: Secondary | ICD-10-CM

## 2013-07-08 NOTE — Progress Notes (Signed)
Patient ID: JARAH PEMBER, female   DOB: [birth year], 68 y.o.   MRN: [id number]      HPI: Debbie [name], is a 68 y.o. female who presents to the office today for a 6 month  followup evaluation.   Debbie [name] has known coronary artery disease and  in 2001 suffered an acute coronary syndrome and was found to have total occlusion of her RCA. She had 3 stents placed to her RCA. Subsequent catheterization in 2006 showed all 3 stents widely patent. Additionally, she has a history of hypertension as well as hyperlipidemia. She also has a history of atrial tachycardia. A nuclear perfusion study in September 2013 showed minimal anterior thinning not felt to be significant.  Debbie [name] has continued to remain fairly stable. Last year she underwent laboratory which showed a hemoglobin of 13.7 hematocrit 40.6. Lecture lites were normal with the exception of mild glucose elevation of 133. TSH was normal at 2.42. Her cholesterol was excellent at 151 with an LDL of 73 HDL 57 triglycerides 105.  Because of mild possible claudication symptoms her lower extremity, she did undergo a lower extremity arterial Doppler study which suggested mild arterial insufficiency with ABIs of 0.83 bilaterally. She denies any chest pain. She denies any dizziness. She denies any breakthrough tachycardia or awareness of atrial fibrillation. She presents for a three-month evaluation.  When I last saw her in September laboratory suggested that she had evolved into type 2 diabetes mellitus. At that time her hemoglobin A1c was 7.0 and her fasting blood sugar was 128. She had normal renal function. She subsequently saw Dr. Lou Miner who did not initiate medical therapy but to pursue aggressive dietary adjustments initially. She tells me she will be seeing Dr. Elder Cyphers back next month and repeat laboratory will be obtained.  Presently, she denies any chest pain. She denies shortness of breath. She takes several medications for blood pressure control  and believes her blood pressure has been stable. She denies any palpitations.   Past Medical History  Diagnosis Date  . CAD (coronary artery disease) Aug 2001    RCA stent X3, patent '06. Nuc low risk 9/13  . Hypertension   . Hypercholesteremia   . Stroke 1998  . PAF (paroxysmal atrial fibrillation) Aug 2013    SSS component with some bradycardia  . History of stress test 01/08/2012    Showed minimal anterior thinning not felt to be significant.  Marland Kitchen Hx of echocardiogram 12/12/2011    EF>55%    Past Surgical History  Procedure Laterality Date  . Coronary angioplasty with stent placement  Aug 2001    X 3  . Coronary angiogram  May 2006    patent stents    No Known Allergies  Current Outpatient Prescriptions  Medication Sig Dispense Refill  . Multiple Vitamins-Minerals (CENTRUM SILVER ADULT 50+) TABS Take 1 tablet by mouth daily.      Marland Kitchen amLODipine-atorvastatin (CADUET) 10-80 MG per tablet Take 1 tablet by mouth every evening.  30 tablet  11  . aspirin EC 325 MG tablet Take 325 mg by mouth daily.      Marland Kitchen ezetimibe (ZETIA) 10 MG tablet Take 1 tablet (10 mg total) by mouth daily.  30 tablet  11  . hydrochlorothiazide (HYDRODIURIL) 25 MG tablet Take 1 tablet (25 mg total) by mouth daily.  30 tablet  11  . metoprolol succinate (TOPROL-XL) 50 MG 24 hr tablet Take 1 tablet (50 mg total) by mouth daily. Take with or immediately  following a meal.  30 tablet  6  . ramipril (ALTACE) 10 MG capsule Take 1 capsule (10 mg total) by mouth daily.  30 capsule  11   No current facility-administered medications for this visit.    Socially she denies tobacco or alcohol. She does not routinely exercise.  ROS is negative for fevers, chills or night sweats.  She denies visual symptoms.  she denies change in hearing. There is no lymphadenopathy. She denies shortness of breath. She denies wheezing. She denies PND or orthopnea. She denies any significant weight change. She denies chest pressure. She  denies PND or orthopnea. She denies any recent palpitations.  there is no chest pressure. She denies abdominal discomfort. She denies nausea vomiting or diarrhea. There is no blood in stool or urine. She denies bleeding melena or hematochezia.  does note intermittent mild pain in her legs with walking. She denies significant leg swelling as long as she takes her hydrochlorothiazide. Other comprehensive 13 point system review is negative.  PE BP 120/72  Pulse 55  Ht 5' 5.5" (1.664 m)  Wt 201 lb 1.6 oz (91.218 kg)  BMI 32.94 kg/m2  Repeat BP 124/70 General: Alert, oriented, no distress.  Skin: normal turgor, no rashes HEENT: Normocephalic, atraumatic. Pupils round and reactive; sclera anicteric;no lid lag.  Nose without nasal septal hypertrophy Mouth/Parynx benign; Mallinpatti scale 3 Neck: No JVD, no carotid bruits with normal carotid upstroke  Chest wall: Nontender to palpation  Lungs: clear to ausculatation and percussion; no wheezing or rales Heart:  PMI not displaced.RRR, s1 s2 normal; no ectopy. Faint 1/6 systolic murmur. no diastolic murmur. No rubs thrills or heaves.  Abdomen: Mild central adiposity. soft, nontender; no hepatosplenomehaly, BS+; abdominal aorta nontender and not dilated by palpation. Back: No CVA tenderness  Pulses 2+ Extremities: no clubbing cyanosis or edema, Homan's sign negative  Neurologic: grossly nonfocal  psychological: Normal affect and mood  ECG this is indeterminate by me): Sinus bradycardia 55 beats per minute. No ectopy.  Prior September,16 2014 ECG: Sinus bradycardia 48 beats per minute. Intervals normal  LABS:  BMET    Component Value Date/Time   NA 138 12/30/2012 0820   K 4.2 12/30/2012 0820   CL 103 12/30/2012 0820   CO2 27 12/30/2012 0820   GLUCOSE 128* 12/30/2012 0820   BUN 20 12/30/2012 0820   CREATININE 1.01 12/30/2012 0820   CREATININE 0.87 12/14/2011 0812   CALCIUM 10.1 12/30/2012 0820   GFRNONAA 67* 12/14/2011 0812   GFRAA 78* 12/14/2011  0812     Hepatic Function Panel     Component Value Date/Time   PROT 7.2 09/29/2012 0838   ALBUMIN 4.3 09/29/2012 0838   AST 16 09/29/2012 0838   ALT 21 09/29/2012 0838   ALKPHOS 81 09/29/2012 0838   BILITOT 0.5 09/29/2012 0838     CBC    Component Value Date/Time   WBC 8.3 09/29/2012 0838   RBC 4.52 09/29/2012 0838   HGB 13.7 09/29/2012 0838   HCT 40.6 09/29/2012 0838   PLT 360 09/29/2012 0838   MCV 89.8 09/29/2012 0838   MCH 30.3 09/29/2012 0838   MCHC 33.7 09/29/2012 0838   RDW 14.2 09/29/2012 0838     BNP No results found for this basename: probnp    Lipid Panel     Component Value Date/Time   CHOL 151 09/29/2012 0838   TRIG 105 09/29/2012 0838   HDL 57 09/29/2012 0838   CHOLHDL 2.6 09/29/2012 0838   VLDL 21 09/29/2012  [place] 09/29/2012 0838     RADIOLOGY: No results found.    ASSESSMENT AND PLAN: Debbie [name] is doing fairly well with reference to her known coronary artery disease. She's not having any anginal symptoms. She does have a history of significant hypertension and on her current drug medical regimen including amlodipine 10 mg hydrochlorothiazide 25 mg Toprol-XL 50 mg Altace 10 mg per her blood pressure today is well controlled. She is on atorvastatin 80 mg  and Zetia 10 mg for her hyperlipidemia. She does have probable early diabetes. She has been on dietary adjustment. Followup laboratory is scheduled to be done  by Dr. Elder Cyphers next month.  She denies recent change in mild claudication symptoms. Her lower extremity duplex study suggested only mild arterial insufficiency accounting for some of her symptoms.  Her previous bradycardia has improved now been in the 50s on her reduced dose of Toprol. She is unaware of any recurrent episodes of atrial tachycardia. As long as she remains stable, I will see her in one year for cardiology reevaluation.  Time spent: 25 minutes   Troy Sine, MD, [facility]  07/08/2013 11:09 AM

## 2013-07-08 NOTE — Patient Instructions (Signed)
Your physician recommends that you schedule a follow-up appointment in: 1 year  

## 2013-11-15 ENCOUNTER — Other Ambulatory Visit: Payer: Self-pay | Admitting: *Deleted

## 2013-11-15 MED ORDER — RAMIPRIL 10 MG PO CAPS: 10.0000 mg | ORAL_CAPSULE | Freq: Every day | ORAL | Status: DC

## 2013-11-15 MED ORDER — HYDROCHLOROTHIAZIDE 25 MG PO TABS: 25.0000 mg | ORAL_TABLET | Freq: Every day | ORAL | Status: DC

## 2013-11-15 MED ORDER — AMLODIPINE-ATORVASTATIN 10-80 MG PO TABS: 1.0000 | ORAL_TABLET | Freq: Every evening | ORAL | Status: DC

## 2013-11-24 ENCOUNTER — Other Ambulatory Visit: Payer: Self-pay | Admitting: *Deleted

## 2013-11-24 MED ORDER — EZETIMIBE 10 MG PO TABS: 10.0000 mg | ORAL_TABLET | Freq: Every day | ORAL | Status: DC

## 2013-11-24 NOTE — Telephone Encounter (Signed)
Rx refill sent to patient pharmacy   

## 2014-02-14 ENCOUNTER — Other Ambulatory Visit: Payer: Self-pay | Admitting: *Deleted

## 2014-02-14 MED ORDER — METOPROLOL SUCCINATE ER 50 MG PO TB24: 50.0000 mg | ORAL_TABLET | Freq: Every day | ORAL | Status: DC

## 2014-02-14 NOTE — Telephone Encounter (Signed)
Refilled electronically 

## 2014-06-29 ENCOUNTER — Other Ambulatory Visit: Payer: Self-pay

## 2014-06-29 MED ORDER — EZETIMIBE 10 MG PO TABS: 10.0000 mg | ORAL_TABLET | Freq: Every day | ORAL | Status: DC

## 2014-06-29 NOTE — Telephone Encounter (Signed)
Rx(s) sent to pharmacy electronically.  

## 2014-07-29 ENCOUNTER — Other Ambulatory Visit: Payer: Self-pay

## 2014-07-29 MED ORDER — EZETIMIBE 10 MG PO TABS: 10.0000 mg | ORAL_TABLET | Freq: Every day | ORAL | Status: DC

## 2014-07-29 NOTE — Telephone Encounter (Signed)
Rx(s) sent to pharmacy electronically.  

## 2014-08-29 ENCOUNTER — Other Ambulatory Visit: Payer: Self-pay | Admitting: *Deleted

## 2014-08-30 ENCOUNTER — Telehealth: Payer: Self-pay

## 2014-08-30 MED ORDER — EZETIMIBE 10 MG PO TABS: 10.0000 mg | ORAL_TABLET | Freq: Every day | ORAL | Status: DC

## 2014-08-30 NOTE — Telephone Encounter (Signed)
Received refill request for zetia.Refill sent to pharmacy.Patient will need to schedule appointment with [facility] last office visit 07/08/13.

## 2014-09-27 ENCOUNTER — Other Ambulatory Visit: Payer: Self-pay | Admitting: *Deleted

## 2014-09-27 MED ORDER — EZETIMIBE 10 MG PO TABS: 10.0000 mg | ORAL_TABLET | Freq: Every day | ORAL | Status: DC

## 2014-09-27 MED ORDER — METOPROLOL SUCCINATE ER 50 MG PO TB24: 50.0000 mg | ORAL_TABLET | Freq: Every day | ORAL | Status: DC

## 2014-09-27 MED ORDER — HYDROCHLOROTHIAZIDE 25 MG PO TABS: 25.0000 mg | ORAL_TABLET | Freq: Every day | ORAL | Status: DC

## 2014-09-27 MED ORDER — AMLODIPINE-ATORVASTATIN 10-80 MG PO TABS: 1.0000 | ORAL_TABLET | Freq: Every evening | ORAL | Status: DC

## 2014-09-27 MED ORDER — RAMIPRIL 10 MG PO CAPS: 10.0000 mg | ORAL_CAPSULE | Freq: Every day | ORAL | Status: DC

## 2014-10-03 ENCOUNTER — Other Ambulatory Visit: Payer: Self-pay | Admitting: *Deleted

## 2014-10-03 MED ORDER — HYDROCHLOROTHIAZIDE 25 MG PO TABS
25.0000 mg | ORAL_TABLET | Freq: Every day | ORAL | Status: DC
Start: 2014-10-03 — End: 2015-12-20

## 2014-10-03 MED ORDER — METOPROLOL SUCCINATE ER 50 MG PO TB24: 50.0000 mg | ORAL_TABLET | Freq: Every day | ORAL | Status: DC

## 2014-10-03 MED ORDER — RAMIPRIL 10 MG PO CAPS: 10.0000 mg | ORAL_CAPSULE | Freq: Every day | ORAL | Status: DC

## 2014-10-05 ENCOUNTER — Other Ambulatory Visit: Payer: Self-pay | Admitting: *Deleted

## 2014-10-05 ENCOUNTER — Other Ambulatory Visit: Payer: Self-pay

## 2014-10-05 MED ORDER — METOPROLOL SUCCINATE ER 50 MG PO TB24: 50.0000 mg | ORAL_TABLET | Freq: Every day | ORAL | Status: DC

## 2014-10-05 MED ORDER — RAMIPRIL 10 MG PO CAPS: 10.0000 mg | ORAL_CAPSULE | Freq: Every day | ORAL | Status: DC

## 2014-10-05 NOTE — Telephone Encounter (Signed)
Rx(s) sent to pharmacy electronically.  

## 2014-10-05 NOTE — Addendum Note (Signed)
Addended by: Diana Eves on: 10/05/2014 12:01 PM   Modules accepted: Orders

## 2014-10-27 ENCOUNTER — Other Ambulatory Visit: Payer: Self-pay

## 2014-10-27 MED ORDER — METOPROLOL SUCCINATE ER 50 MG PO TB24: 50.0000 mg | ORAL_TABLET | Freq: Every day | ORAL | Status: DC

## 2014-10-27 MED ORDER — EZETIMIBE 10 MG PO TABS: 10.0000 mg | ORAL_TABLET | Freq: Every day | ORAL | Status: DC

## 2014-10-27 MED ORDER — RAMIPRIL 10 MG PO CAPS: 10.0000 mg | ORAL_CAPSULE | Freq: Every day | ORAL | Status: DC

## 2014-10-27 MED ORDER — AMLODIPINE-ATORVASTATIN 10-80 MG PO TABS: 1.0000 | ORAL_TABLET | Freq: Every evening | ORAL | Status: DC

## 2014-10-27 NOTE — Telephone Encounter (Signed)
Rx(s) sent to pharmacy electronically.  

## 2015-01-09 ENCOUNTER — Ambulatory Visit (INDEPENDENT_AMBULATORY_CARE_PROVIDER_SITE_OTHER): Payer: Medicare Other | Admitting: Cardiovascular Disease

## 2015-01-09 ENCOUNTER — Encounter: Payer: Self-pay | Admitting: Cardiovascular Disease

## 2015-01-09 VITALS — BP 128/76 | HR 55 | Ht 65.0 in | Wt 197.8 lb

## 2015-01-09 DIAGNOSIS — E785 Hyperlipidemia, unspecified: Secondary | ICD-10-CM

## 2015-01-09 DIAGNOSIS — I25810 Atherosclerosis of coronary artery bypass graft(s) without angina pectoris: Secondary | ICD-10-CM | POA: Diagnosis not present

## 2015-01-09 DIAGNOSIS — E119 Type 2 diabetes mellitus without complications: Secondary | ICD-10-CM

## 2015-01-09 DIAGNOSIS — I10 Essential (primary) hypertension: Secondary | ICD-10-CM | POA: Diagnosis not present

## 2015-01-09 DIAGNOSIS — I480 Paroxysmal atrial fibrillation: Secondary | ICD-10-CM

## 2015-01-09 NOTE — Patient Instructions (Signed)
Your physician recommends that you return for fasting lab work.  Your physician wants you to follow-up in:1 year or sooner if needed. You will receive a reminder letter in the mail two months in advance. If you don't receive a letter, please call our office to schedule the follow-up appointment.

## 2015-01-10 LAB — COMPREHENSIVE METABOLIC PANEL
ALT: 27 U/L (ref 6–29)
AST: 19 U/L (ref 10–35)
Albumin: 4.1 g/dL (ref 3.6–5.1)
Alkaline Phosphatase: 77 U/L (ref 33–130)
BILIRUBIN TOTAL: 0.6 mg/dL (ref 0.2–1.2)
BUN: 19 mg/dL (ref 7–25)
CO2: 26 mmol/L (ref 20–31)
CREATININE: 1.04 mg/dL — AB (ref 0.60–0.93)
Calcium: 10.3 mg/dL (ref 8.6–10.4)
Chloride: 104 mmol/L (ref 98–110)
Glucose, Bld: 149 mg/dL — ABNORMAL HIGH (ref 65–99)
Potassium: 4.4 mmol/L (ref 3.5–5.3)
SODIUM: 138 mmol/L (ref 135–146)
TOTAL PROTEIN: 6.9 g/dL (ref 6.1–8.1)

## 2015-01-10 LAB — LIPID PANEL
CHOLESTEROL: 161 mg/dL (ref 125–200)
HDL: 57 mg/dL (ref >=46)
LDL CALC: 76 mg/dL (ref <130)
Total CHOL/HDL Ratio: 2.8 Ratio (ref <=5.0)
Triglycerides: 140 mg/dL (ref <150)
VLDL: 28 mg/dL (ref <30)

## 2015-01-10 LAB — CBC
HCT: 40.5 % (ref 36.0–46.0)
Hemoglobin: 14 g/dL (ref 12.0–15.0)
MCH: 30.8 pg (ref 26.0–34.0)
MCHC: 34.6 g/dL (ref 30.0–36.0)
MCV: 89 fL (ref 78.0–100.0)
MPV: 9.4 fL (ref 8.6–12.4)
Platelets: 353 10*3/uL (ref 150–400)
RBC: 4.55 MIL/uL (ref 3.87–5.11)
RDW: 14.4 % (ref 11.5–15.5)
WBC: 7.9 10*3/uL (ref 4.0–10.5)

## 2015-01-10 LAB — TSH: TSH: 1.934 u[IU]/mL (ref 0.350–4.500)

## 2015-01-11 ENCOUNTER — Encounter: Payer: Self-pay | Admitting: Cardiovascular Disease

## 2015-01-11 LAB — HEMOGLOBIN A1C
Hgb A1c MFr Bld: 7.1 % — ABNORMAL HIGH (ref <5.7)
Mean Plasma Glucose: 157 mg/dL — ABNORMAL HIGH (ref <117)

## 2015-01-11 NOTE — Progress Notes (Signed)
Patient ID: Debbie [name], female   DOB: 12/06/1945, 69 y.o.   MRN: [id number]    HPI: Debbie [name], is a 69 y.o. female who presents to the office today for an 86  month  followup evaluation.   Debbie [name] has known CAD and in 2001 suffered an acute coronary syndrome and was found to have total occlusion of her RCA. She had 3 stents placed to her RCA. Subsequent catheterization in 2006 showed all 3 stents widely patent. Additionally, she has a history of hypertension as well as hyperlipidemia. She also has a history of atrial tachycardia. A nuclear perfusion study in September 2013 showed minimal anterior thinning not felt to be significant.  Because of mild possible claudication symptoms her lower extremity, she underwent a lower extremity arterial Doppler study which suggested mild arterial insufficiency with ABIs of 0.83 bilaterally.  When I  saw her in September 2014 laboratory suggested that she had evolved into type 2 diabetes mellitus. At that time her hemoglobin A1c was 7.0 and her fasting blood sugar was 128. She had normal renal function. She subsequently saw Dr. Lou Miner who did not initiate medical therapy but to pursue aggressive dietary adjustments initially.   Since I saw her 18 months ago, she denies recurrent chest pain symptoms.  She is unaware of palpitations.  She has not had significant weight loss.  She has been on amlodipine 10 mg, HCTZ 25 mg, Toprol 50 mg and ramipril 10 mg for hypertension.  She is on Lipitor 80 mg and Zetia 10 mg for hyperlipidemia.  She presents for evaluation  Past Medical History  Diagnosis Date  . CAD (coronary artery disease) Aug 2001    RCA stent X3, patent '06. Nuc low risk 9/13  . Hypertension   . Hypercholesteremia   . Stroke 1998  . PAF (paroxysmal atrial fibrillation) Aug 2013    SSS component with some bradycardia  . History of stress test 01/08/2012    Showed minimal anterior thinning not felt to be significant.  Marland Kitchen Hx of echocardiogram  12/12/2011    EF>55%    Past Surgical History  Procedure Laterality Date  . Coronary angioplasty with stent placement  Aug 2001    X 3  . Coronary angiogram  May 2006    patent stents    No Known Allergies  Current Outpatient Prescriptions  Medication Sig Dispense Refill  . amLODipine-atorvastatin (CADUET) 10-80 MG per tablet Take 1 tablet by mouth every evening. MUST KEEP APPOINTMENT 01/09/2015 WITH DR Claiborne Billings FOR FUTURE REFILLS 60 tablet 0  . aspirin EC 325 MG tablet Take 325 mg by mouth daily.    Marland Kitchen ezetimibe (ZETIA) 10 MG tablet Take 1 tablet (10 mg total) by mouth daily. MUST KEEP APPOINTMENT 01/09/2015 WITH DR Claiborne Billings FOR FUTURE REFILLS 60 tablet 0  . hydrochlorothiazide (HYDRODIURIL) 25 MG tablet Take 1 tablet (25 mg total) by mouth daily. 90 tablet 3  . metoprolol succinate (TOPROL-XL) 50 MG 24 hr tablet Take 1 tablet (50 mg total) by mouth daily. MUST KEEP APPOINTMENT 01/09/2015 WITH DR Claiborne Billings FOR FUTURE REFILLS 60 tablet 0  . Multiple Vitamins-Minerals (CENTRUM SILVER ADULT 50+) TABS Take 1 tablet by mouth daily.    . ramipril (ALTACE) 10 MG capsule Take 1 capsule (10 mg total) by mouth daily. MUST KEEP APPOINTMENT 01/09/2015 WITH DR Claiborne Billings FOR FUTURE REFILLS 60 capsule 0   No current facility-administered medications for this visit.    Socially she denies tobacco or alcohol. She does  not routinely exercise.   ROS General: Negative; No fevers, chills, or night sweats;  HEENT: Negative; No changes in vision or hearing, sinus congestion, difficulty swallowing Pulmonary: Negative; No cough, wheezing, shortness of breath, hemoptysis Cardiovascular: Negative; No chest pain, presyncope, syncope, palpitations GI: Negative; No nausea, vomiting, diarrhea, or abdominal pain GU: Negative; No dysuria, hematuria, or difficulty voiding Musculoskeletal: Negative; no myalgias, joint pain, or weakness Hematologic/Oncology: Negative; no easy bruising, bleeding Endocrine: Negative; no heat/cold  intolerance; no diabetes Neuro: Negative; no changes in balance, headaches Skin: Negative; No rashes or skin lesions Psychiatric: Negative; No behavioral problems, depression Sleep: Negative; No snoring, daytime sleepiness, hypersomnolence, bruxism, restless legs, hypnogognic hallucinations, no cataplexy Other comprehensive 14 point system review is negative.   PE BP 128/76 mmHg  Pulse 55  Ht $R'5\' 5"'Gx$  (1.651 m)  Wt 197 lb 12.8 oz (89.721 kg)  BMI 32.92 kg/m2  Repeat BP 124/74  Wt Readings from Last 3 Encounters:  01/09/15 197 lb 12.8 oz (89.721 kg)  07/08/13 201 lb 1.6 oz (91.218 kg)  12/29/12 199 lb 6.4 oz (90.447 kg)   General: Alert, oriented, no distress.  Skin: normal turgor, no rashes HEENT: Normocephalic, atraumatic. Pupils round and reactive; sclera anicteric;no lid lag.  Nose without nasal septal hypertrophy Mouth/Parynx benign; Mallinpatti scale 3 Neck: No JVD, no carotid bruits with normal carotid upstroke  Chest wall: Nontender to palpation  Lungs: clear to ausculatation and percussion; no wheezing or rales Heart:  PMI not displaced.RRR, s1 s2 normal; no ectopy. Faint 1/6 systolic murmur. no diastolic murmur. No rubs thrills or heaves.  Abdomen: Mild central adiposity. soft, nontender; no hepatosplenomehaly, BS+; abdominal aorta nontender and not dilated by palpation. Back: No CVA tenderness  Pulses 2+ Extremities: no clubbing cyanosis or edema, Homan's sign negative  Neurologic: grossly nonfocal  psychological: Normal affect and mood  ECG (independently read by me): Sinus bradycardia 55 bpm.  No significant ST-T changes.  ECG this is indeterminate by me): Sinus bradycardia 55 beats per minute. No ectopy.  Prior September,16 2014 ECG: Sinus bradycardia 48 beats per minute. Intervals normal  LABS: BMP Latest Ref Rng 01/09/2015 12/30/2012 09/29/2012  Glucose 65 - 99 mg/dL 149(H) 128(H) 133(H)  BUN 7 - 25 mg/dL $Remove'19 20 21  'dVWEbLC$ Creatinine 0.60 - 0.93 mg/dL 1.04(H) 1.01 1.06   Sodium 135 - 146 mmol/L 138 138 138  Potassium 3.5 - 5.3 mmol/L 4.4 4.2 4.1  Chloride 98 - 110 mmol/L 104 103 106  CO2 20 - 31 mmol/L $RemoveB'26 27 22  'IWFqqyUI$ Calcium 8.6 - 10.4 mg/dL 10.3 10.1 9.9   Hepatic Function Latest Ref Rng 01/09/2015 09/29/2012  Total Protein 6.1 - 8.1 g/dL 6.9 7.2  Albumin 3.6 - 5.1 g/dL 4.1 4.3  AST 10 - 35 U/L 19 16  ALT 6 - 29 U/L 27 21  Alk Phosphatase 33 - 130 U/L 77 81  Total Bilirubin 0.2 - 1.2 mg/dL 0.6 0.5   CBC Latest Ref Rng 01/09/2015 09/29/2012 12/14/2011  WBC 4.0 - 10.5 K/uL 7.9 8.3 8.0  Hemoglobin 12.0 - 15.0 g/dL 14.0 13.7 11.8(L)  Hematocrit 36.0 - 46.0 % 40.5 40.6 34.8(L)  Platelets 150 - 400 K/uL 353 360 262   Lab Results  Component Value Date   MCV 89.0 01/09/2015   MCV 89.8 09/29/2012   MCV 89.7 12/14/2011   Lab Results  Component Value Date   TSH 1.934 01/09/2015   Lab Results  Component Value Date   HGBA1C 7.1* 01/09/2015   Lipid Panel  Component Value Date/Time   CHOL 161 01/09/2015 0818   TRIG 140 01/09/2015 0818   HDL 57 01/09/2015 0818   CHOLHDL 2.8 01/09/2015 0818   VLDL 28 01/09/2015 0818   LDLCALC 76 01/09/2015 0818   RADIOLOGY: No results found.    ASSESSMENT AND PLAN: Debbie [name] is a 69 year old female who continues to do well with reference to her known coronary artery disease. She iss not having any anginal symptoms. She has history of significant hypertension and on her current drug medical regimen including amlodipine 10 mg, hydrochlorothiazide 25 mg, Toprol-XL 50 mg and  Altace 10 mg. Her blood pressure today is well controlled. She is on atorvastatin 80 mg  and Zetia 10 mg for her hyperlipidemia. She was felt to have  probable early diabetes several years ago and had been on dietary adjustment.  I have recommended follow-up laboratory be obtained today.  These results have returned after I had seen the patient.  Results indicate she most likely is diabetic with a fasting glucose of 149 and hemoglobin A1c at 7.1.  I  will recommend that she follow-up with Dr. Elder Cyphers for consideration of institution of therapy.  Her medications were renewed.  Lipid study is good on current therapy with atorvastatin and Zetia.  I will see her one year for cardiology evaluation.  Time spent: 25 minutes  Troy Sine, MD, [facility]  01/11/2015 3:10 PM

## 2015-01-25 ENCOUNTER — Encounter: Payer: Self-pay | Admitting: *Deleted

## 2015-01-30 ENCOUNTER — Other Ambulatory Visit: Payer: Self-pay | Admitting: Cardiovascular Disease

## 2015-02-05 ENCOUNTER — Other Ambulatory Visit: Payer: Self-pay | Admitting: Cardiovascular Disease

## 2015-05-30 ENCOUNTER — Encounter: Payer: Self-pay | Admitting: Gastroenterology

## 2015-10-20 ENCOUNTER — Other Ambulatory Visit: Payer: Self-pay | Admitting: Internal Medicine

## 2015-10-20 DIAGNOSIS — Z1231 Encounter for screening mammogram for malignant neoplasm of breast: Secondary | ICD-10-CM

## 2015-10-27 ENCOUNTER — Ambulatory Visit
Admission: RE | Admit: 2015-10-27 | Discharge: 2015-10-27 | Disposition: A | Discharge status: Home / Self Care | Payer: Medicare Other | Source: Ambulatory Visit | Attending: Internal Medicine | Admitting: Internal Medicine

## 2015-10-27 DIAGNOSIS — Z1231 Encounter for screening mammogram for malignant neoplasm of breast: Secondary | ICD-10-CM

## 2015-10-31 ENCOUNTER — Other Ambulatory Visit: Payer: Self-pay | Admitting: Internal Medicine

## 2015-10-31 DIAGNOSIS — R928 Other abnormal and inconclusive findings on diagnostic imaging of breast: Secondary | ICD-10-CM

## 2015-11-03 ENCOUNTER — Ambulatory Visit
Admission: RE | Admit: 2015-11-03 | Discharge: 2015-11-03 | Disposition: A | Discharge status: Home / Self Care | Payer: Medicare Other | Source: Ambulatory Visit | Attending: Internal Medicine | Admitting: Internal Medicine

## 2015-11-03 DIAGNOSIS — R928 Other abnormal and inconclusive findings on diagnostic imaging of breast: Secondary | ICD-10-CM

## 2015-12-20 ENCOUNTER — Other Ambulatory Visit: Payer: Self-pay | Admitting: Cardiovascular Disease

## 2015-12-27 ENCOUNTER — Telehealth: Payer: Self-pay | Admitting: Cardiovascular Disease

## 2015-12-27 NOTE — Telephone Encounter (Signed)
Closed encounter °

## 2016-01-10 ENCOUNTER — Encounter: Payer: Self-pay | Admitting: Cardiology

## 2016-01-10 ENCOUNTER — Ambulatory Visit (INDEPENDENT_AMBULATORY_CARE_PROVIDER_SITE_OTHER): Payer: Medicare Other | Admitting: Cardiology

## 2016-01-10 DIAGNOSIS — R079 Chest pain, unspecified: Secondary | ICD-10-CM | POA: Diagnosis not present

## 2016-01-10 DIAGNOSIS — E785 Hyperlipidemia, unspecified: Secondary | ICD-10-CM | POA: Diagnosis not present

## 2016-01-10 DIAGNOSIS — I10 Essential (primary) hypertension: Secondary | ICD-10-CM

## 2016-01-10 DIAGNOSIS — I480 Paroxysmal atrial fibrillation: Secondary | ICD-10-CM

## 2016-01-10 DIAGNOSIS — R0789 Other chest pain: Secondary | ICD-10-CM | POA: Insufficient documentation

## 2016-01-10 MED ORDER — NITROGLYCERIN 0.4 MG SL SUBL: 0.4000 mg | SUBLINGUAL_TABLET | SUBLINGUAL | 3 refills | Status: DC | PRN

## 2016-01-10 NOTE — Assessment & Plan Note (Signed)
Pt has noted Lt sided chest "pressure" for the past week.

## 2016-01-10 NOTE — Assessment & Plan Note (Signed)
We follow her lipids

## 2016-01-10 NOTE — Patient Instructions (Addendum)
Medication Instructions:  Your physician recommends that you continue on your current medications as directed. Please refer to the Current Medication list given to you today.  Labwork: Your physician recommends that you return for lab work in: [place].   Testing/Procedures: Your physician has requested that you have a lexiscan myoview. For further information please visit [email or web address]. Please follow instruction sheet, as given.  Follow-Up: Your physician recommends that you schedule a follow-up appointment in: [place].   Any Other Special Instructions Will Be Listed Below (If Applicable).     If you need a refill on your cardiac medications before your next appointment, please call your pharmacy.

## 2016-01-10 NOTE — Progress Notes (Signed)
01/10/2016 Debbie [name]   1946/02/28  [id number]  Primary Physician Dr [facility] Primary Cardiologist: Dr Claiborne Billings  HPI:  70 y/o AA female with a history of CAD, s/p RCA PCI (x3) in 2001. Cath in 2006 showed patent RCA, Myoview in 2013 was low risk. She saw Dr Claiborne Billings last September and has done well until this past week. The pt says she has noted Lt sided chest "pressure". It is not exertional and usually she notices it at night. The pain does not radiate to her arms jaw or back. She denies any associated nausea, vomiting, or diaphoresis. She has not taken anything for this. She denies any unusual exertional dyspnea though she admits she is not very active.    Current Outpatient Prescriptions  Medication Sig Dispense Refill  . amLODipine-atorvastatin (CADUET) 10-80 MG tablet take 1 tablet by mouth every evening 60 tablet 11  . aspirin EC 325 MG tablet Take 325 mg by mouth daily.    . hydrochlorothiazide (HYDRODIURIL) 25 MG tablet Take 1 tablet (25 mg total) by mouth daily. Keep appointment for future refills. 30 tablet 0  . metoprolol succinate (TOPROL-XL) 50 MG 24 hr tablet take 1 tablet by mouth once daily 60 tablet 10  . Multiple Vitamins-Minerals (CENTRUM SILVER ADULT 50+) TABS Take 1 tablet by mouth daily.    . ramipril (ALTACE) 10 MG capsule take 1 capsule by mouth once daily 60 capsule 10  . ZETIA 10 MG tablet take 1 tablet by mouth once daily 60 tablet 11   No current facility-administered medications for this visit.     No Known Allergies  Social History   Social History  . Marital status: Married    Spouse name: N/A  . Number of children: N/A  . Years of education: N/A   Occupational History  . Not on file.   Social History Main Topics  . Smoking status: Former Smoker    Years: 60.00    Types: Cigarettes  . Smokeless tobacco: Former Systems developer    Quit date: 12/11/2011  . Alcohol use No  . Drug use: No  . Sexual activity: Not on file   Other Topics Concern    . Not on file   Social History Narrative  . No narrative on file     Review of Systems: General: negative for chills, fever, night sweats or weight changes.  Cardiovascular: negative for chest pain, dyspnea on exertion, edema, orthopnea, palpitations, paroxysmal nocturnal dyspnea or shortness of breath Dermatological: negative for rash Respiratory: negative for cough or wheezing Urologic: negative for hematuria Abdominal: negative for nausea, vomiting, diarrhea, bright red blood per rectum, melena, or hematemesis Neurologic: negative for visual changes, syncope, or dizziness All other systems reviewed and are otherwise negative except as noted above.    Blood pressure 136/76, pulse (!) 59, height 5' 5.5" (1.664 m), weight 200 lb 12.8 oz (91.1 kg).  General appearance: alert, cooperative, no distress and mildly obese Neck: no carotid bruit and no JVD Lungs: clear to auscultation bilaterally Heart: regular rate and rhythm Extremities: extremities normal, atraumatic, no cyanosis or edema Skin: Skin color, texture, turgor normal. No rashes or lesions Neurologic: Grossly normal  EKG NSR  ASSESSMENT AND PLAN:   Chest pain with moderate risk for cardiac etiology Pt has noted Lt sided chest "pressure" for the past week.  CAD S/P percutaneous coronary angioplasty RCA stent x 3 in'01. Ptaent '06. Myoview low risk 9/13  Essential hypertension Controlled  PAF (paroxysmal atrial fibrillation) (  [place]) Paroxysmal atrial fibrillation Aug 2013  Hyperlipidemia We follow her lipids   PLAN  She has new chest pain with some typical, some atypical symptoms. She has known CAD with multiple risk factors for progression. I will order a Lexiscan Myoview and have provided her with an Rx for SL NTG with instructions. . She can have fasting lipids done that day. F/U with Dr Katherene Ponto [facility] PA-C 01/10/2016 9:44 AM

## 2016-01-10 NOTE — Assessment & Plan Note (Signed)
Controlled.  

## 2016-01-10 NOTE — Assessment & Plan Note (Signed)
Paroxysmal atrial fibrillation Aug 2013

## 2016-01-10 NOTE — Assessment & Plan Note (Signed)
RCA stent x 3 in'01. Ptaent '06. Myoview low risk 9/13

## 2016-01-17 ENCOUNTER — Telehealth (HOSPITAL_COMMUNITY): Payer: Self-pay

## 2016-01-17 NOTE — Telephone Encounter (Signed)
Encounter complete. 

## 2016-01-18 LAB — LIPID PANEL
Cholesterol: 150 mg/dL (ref 125–200)
HDL: 64 mg/dL (ref >=46)
LDL Cholesterol: 64 mg/dL (ref <130)
Total CHOL/HDL Ratio: 2.3 Ratio (ref <=5.0)
Triglycerides: 110 mg/dL (ref <150)
VLDL: 22 mg/dL (ref <30)

## 2016-01-18 LAB — COMPREHENSIVE METABOLIC PANEL
ALT: 17 U/L (ref 6–29)
AST: 15 U/L (ref 10–35)
Albumin: 4.1 g/dL (ref 3.6–5.1)
Alkaline Phosphatase: 72 U/L (ref 33–130)
BUN: 29 mg/dL — ABNORMAL HIGH (ref 7–25)
CO2: 20 mmol/L (ref 20–31)
Calcium: 9.9 mg/dL (ref 8.6–10.4)
Chloride: 108 mmol/L (ref 98–110)
Creat: 1.14 mg/dL — ABNORMAL HIGH (ref 0.60–0.93)
Glucose, Bld: 150 mg/dL — ABNORMAL HIGH (ref 65–99)
Potassium: 4.5 mmol/L (ref 3.5–5.3)
Sodium: 139 mmol/L (ref 135–146)
Total Bilirubin: 0.4 mg/dL (ref 0.2–1.2)
Total Protein: 6.6 g/dL (ref 6.1–8.1)

## 2016-01-19 ENCOUNTER — Ambulatory Visit (HOSPITAL_COMMUNITY)
Admission: RE | Admit: 2016-01-19 | Discharge: 2016-01-19 | Disposition: A | Discharge status: Home / Self Care | Payer: Medicare Other | Source: Ambulatory Visit | Attending: Cardiology | Admitting: Cardiology

## 2016-01-19 DIAGNOSIS — R079 Chest pain, unspecified: Secondary | ICD-10-CM | POA: Insufficient documentation

## 2016-01-19 LAB — MYOCARDIAL PERFUSION IMAGING
LV dias vol: 87 mL (ref 46–106)
LV sys vol: 37 mL
Peak HR: 81 {beats}/min
Rest HR: 47 {beats}/min
SDS: 0
SRS: 0
SSS: 0
TID: 1.01

## 2016-01-19 IMAGING — NM MPI
6 series · 36 of 36 positions shown · non-contrast
Comparison: none

[Series 1: wbr_r-proj_st wbr rest · 6.40mm/px · 6 of 64 frames shown]
[frame 6/64]
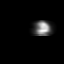
[frame 16/64]
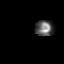
[frame 27/64]
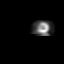
[frame 38/64]
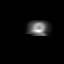
[frame 48/64]
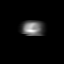
[frame 59/64]
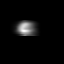

[Series 1: wbr rest · 6.40mm/px · 6 of 64 frames shown]
[frame 6/64]
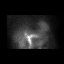
[frame 16/64]
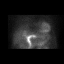
[frame 27/64]
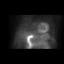
[frame 38/64]
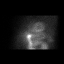
[frame 48/64]
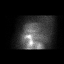
[frame 59/64]
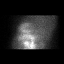

[Series 2: wbr_s-proj_st wbr stress-gsp · 6.40mm/px · 6 of 512 frames shown]
[frame 43/512]
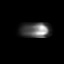
[frame 128/512]
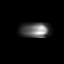
[frame 214/512]
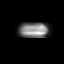
[frame 299/512]
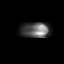
[frame 384/512]
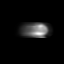
[frame 470/512]
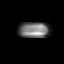

[Series 2: wbr stress-gsp · 6.40mm/px · 6 of 512 frames shown]
[frame 43/512]
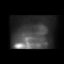
[frame 128/512]
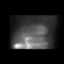
[frame 214/512]
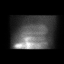
[frame 299/512]
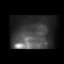
[frame 384/512]
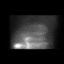
[frame 470/512]
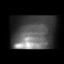

[Series 3: wbr_s-proj_st wbr stress-sum-em · 6.40mm/px · 6 of 64 frames shown]
[frame 6/64]
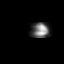
[frame 16/64]
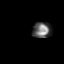
[frame 27/64]
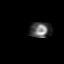
[frame 38/64]
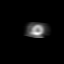
[frame 48/64]
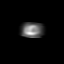
[frame 59/64]
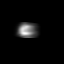

[Series 3: wbr stress-sum-em · 6.40mm/px · 6 of 64 frames shown]
[frame 6/64]
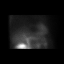
[frame 16/64]
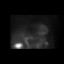
[frame 27/64]
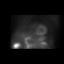
[frame 38/64]
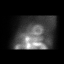
[frame 48/64]
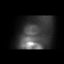
[frame 59/64]
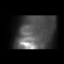

[36 of 36 positions shown; findings below may reference images not displayed]

Canned report from images found in remote index.

Refer to host system for actual result text.

## 2016-01-19 MED ORDER — TECHNETIUM TC 99M TETROFOSMIN IV KIT
31.0000 | PACK | Freq: Once | INTRAVENOUS | Status: AC | PRN
  Administered 2016-01-19: 31 via INTRAVENOUS
  Filled 2016-01-19: qty 31

## 2016-01-19 MED ORDER — REGADENOSON 0.4 MG/5ML IV SOLN
0.4000 mg | Freq: Once | INTRAVENOUS | Status: AC
Start: 2016-01-19 — End: 2016-01-19
  Administered 2016-01-19: 0.4 mg via INTRAVENOUS

## 2016-01-19 MED ORDER — TECHNETIUM TC 99M TETROFOSMIN IV KIT
10.8000 | PACK | Freq: Once | INTRAVENOUS | Status: AC | PRN
  Administered 2016-01-19: 11 via INTRAVENOUS
  Filled 2016-01-19: qty 11

## 2016-02-16 ENCOUNTER — Other Ambulatory Visit: Payer: Self-pay | Admitting: Cardiovascular Disease

## 2016-03-01 ENCOUNTER — Ambulatory Visit (INDEPENDENT_AMBULATORY_CARE_PROVIDER_SITE_OTHER): Payer: Medicare Other | Admitting: Cardiovascular Disease

## 2016-03-01 ENCOUNTER — Encounter: Payer: Self-pay | Admitting: Cardiovascular Disease

## 2016-03-01 VITALS — BP 120/68 | HR 53 | Ht 65.0 in | Wt 204.0 lb

## 2016-03-01 DIAGNOSIS — Z9861 Coronary angioplasty status: Secondary | ICD-10-CM

## 2016-03-01 DIAGNOSIS — E119 Type 2 diabetes mellitus without complications: Secondary | ICD-10-CM | POA: Diagnosis not present

## 2016-03-01 DIAGNOSIS — I2510 Atherosclerotic heart disease of native coronary artery without angina pectoris: Secondary | ICD-10-CM

## 2016-03-01 DIAGNOSIS — E7800 Pure hypercholesterolemia, unspecified: Secondary | ICD-10-CM

## 2016-03-01 DIAGNOSIS — I10 Essential (primary) hypertension: Secondary | ICD-10-CM | POA: Diagnosis not present

## 2016-03-01 MED ORDER — HYDROCHLOROTHIAZIDE 25 MG PO TABS
12.5000 mg | ORAL_TABLET | Freq: Every day | ORAL | 0 refills | Status: DC
Start: 2016-03-01 — End: 2016-03-18

## 2016-03-01 MED ORDER — ASPIRIN EC 81 MG PO TBEC: 81.0000 mg | DELAYED_RELEASE_TABLET | Freq: Every day | ORAL | 3 refills | Status: DC

## 2016-03-01 NOTE — Patient Instructions (Addendum)
Your physician has recommended you make the following change in your medication:   1.) the HCTZ has been decreased to 12.5 mg ( 1/2 of the 25 mg tablet)  2.) the ASA has been changed to 81 mg.  Your physician wants you to follow-up in: 1 year or sooner if needed. You will receive a reminder letter in the mail two months in advance. If you don't receive a letter, please call our office to schedule the follow-up appointment.  If you need a refill on your cardiac medications before your next appointment, please call your pharmacy.

## 2016-03-02 DIAGNOSIS — I2510 Atherosclerotic heart disease of native coronary artery without angina pectoris: Secondary | ICD-10-CM | POA: Insufficient documentation

## 2016-03-02 NOTE — Progress Notes (Signed)
Patient ID: Debbie [name], female   DOB: Mar 11, 1946, 70 y.o.   MRN: [id number]    PCP: Dr. [facility]  HPI: Debbie [name], is a 70 y.o. female who presents to the office today for an 14 month  followup evaluation.  Debbie [name] has known CAD and in 2001 suffered an acute coronary syndrome and was found to have total occlusion of her RCA. She had 3 stents placed to her RCA. Subsequent catheterization in 2006 showed all 3 stents widely patent. Additionally, she has a history of hypertension as well as hyperlipidemia. She also has a history of atrial tachycardia. A nuclear perfusion study in September 2013 showed minimal anterior thinning not felt to be significant.  Because of mild possible claudication symptoms her lower extremity, she underwent a lower extremity arterial Doppler study which suggested mild arterial insufficiency with ABIs of 0.83 bilaterally.  When I  saw her in September 2014 laboratory suggested that she had evolved into type 2 diabetes mellitus. At that time her hemoglobin A1c was 7.0 and her fasting blood sugar was 128. She had normal renal function. She subsequently saw Dr. Lou [name] who did not initiate medical therapy but to pursue aggressive dietary adjustments initially.   Since I last saw her, she was evaluated in September 2017 by Debbie [name] after experiencing left-sided chest pressure which was not exertional and oftentimes occurring at the night.  It did not radiate to her arms, jaw, or back.  She denied associated nausea, vomiting or diaphoresis.  She was referred for nuclear perfusion study which was done on 01/19/2016.  This was a normal study.  Ejection fraction was 58%.  She underwent blood work time and on her dose of Lipitor 80 mg and Zetia 10 mg total cholesterol was 150, triglycerides 110, HDL 64, and LDL 64. There was very minimal renal insufficiency with a creatinine of 1.14.  Her fasting glucose was 150.  She presents for follow-up cardiology  evaluation.  Past Medical History:  Diagnosis Date  . CAD (coronary artery disease) Aug 2001   RCA stent X3, patent '06. Nuc low risk 9/13  . History of stress test 01/08/2012   Showed minimal anterior thinning not felt to be significant.  Marland Kitchen Hx of echocardiogram 12/12/2011   EF>55%  . Hypercholesteremia   . Hypertension   . PAF (paroxysmal atrial fibrillation) [name]) Aug 2013   SSS component with some bradycardia  . Stroke [name]) 1998    Past Surgical History:  Procedure Laterality Date  . CORONARY ANGIOGRAM  May 2006   patent stents  . CORONARY ANGIOPLASTY WITH STENT PLACEMENT  Aug 2001   X 3    No Known Allergies  Current Outpatient Prescriptions  Medication Sig Dispense Refill  . amLODipine-atorvastatin (CADUET) 10-80 MG tablet take 1 tablet by mouth every evening 60 tablet 11  . hydrochlorothiazide (HYDRODIURIL) 25 MG tablet Take 0.5 tablets (12.5 mg total) by mouth daily. 30 tablet 0  . metoprolol succinate (TOPROL-XL) 50 MG 24 hr tablet take 1 tablet by mouth once daily 60 tablet 10  . Multiple Vitamins-Minerals (CENTRUM SILVER ADULT 50+) TABS Take 1 tablet by mouth daily.    . nitroGLYCERIN (NITROSTAT) 0.4 MG SL tablet Place 1 tablet (0.4 mg total) under the tongue every 5 (five) minutes as needed for chest pain. 25 tablet 3  . ramipril (ALTACE) 10 MG capsule take 1 capsule by mouth once daily 60 capsule 10  . ZETIA 10 MG tablet take 1 tablet  by mouth once daily 60 tablet 11  . aspirin EC 81 MG tablet Take 1 tablet (81 mg total) by mouth daily. 90 tablet 3   No current facility-administered medications for this visit.     Socially she denies tobacco or alcohol. She does not routinely exercise.   ROS General: Negative; No fevers, chills, or night sweats;  HEENT: Negative; No changes in vision or hearing, sinus congestion, difficulty swallowing Pulmonary: Negative; No cough, wheezing, shortness of breath, hemoptysis Cardiovascular: Negative; No chest pain,  presyncope, syncope, palpitations GI: Negative; No nausea, vomiting, diarrhea, or abdominal pain GU: Negative; No dysuria, hematuria, or difficulty voiding Musculoskeletal: Negative; no myalgias, joint pain, or weakness Hematologic/Oncology: Negative; no easy bruising, bleeding Endocrine: Negative; no heat/cold intolerance; no diabetes Neuro: Negative; no changes in balance, headaches Skin: Negative; No rashes or skin lesions Psychiatric: Negative; No behavioral problems, depression Sleep: Negative; No snoring, daytime sleepiness, hypersomnolence, bruxism, restless legs, hypnogognic hallucinations, no cataplexy Other comprehensive 14 point system review is negative.   PE BP 120/68 (Cuff Size: Large)   Pulse (!) 53   Ht '5\' 5"'$  (1.651 m)   Wt 204 lb (92.5 kg)   BMI 33.95 kg/m   Repeat BP 130/70  Wt Readings from Last 3 Encounters:  03/01/16 204 lb (92.5 kg)  01/19/16 200 lb (90.7 kg)  01/10/16 200 lb 12.8 oz (91.1 kg)   General: Alert, oriented, no distress.  Skin: normal turgor, no rashes HEENT: Normocephalic, atraumatic. Pupils round and reactive; sclera anicteric;no lid lag.  Nose without nasal septal hypertrophy Mouth/Parynx benign; Mallinpatti scale 3 Neck: No JVD, no carotid bruits with normal carotid upstroke  Chest wall: Nontender to palpation  Lungs: clear to ausculatation and percussion; no wheezing or rales Heart:  PMI not displaced.RRR, s1 s2 normal; no ectopy. Faint 1/6 systolic murmur. no diastolic murmur. No rubs thrills or heaves.  Abdomen: Mild central adiposity. soft, nontender; no hepatosplenomehaly, BS+; abdominal aorta nontender and not dilated by palpation. Back: No CVA tenderness  Pulses 2+ Extremities: no clubbing cyanosis or edema, Homan's sign negative  Neurologic: grossly nonfocal  psychological: Normal affect and mood  ECG (independently read by me): Sinus bradycardia at 53 with mild sinus arrhythmia.  Nonspecific T changes.  September 2016 ECG  (independently read by me): Sinus bradycardia 55 bpm.  No significant ST-T changes.  March 2015 ECG (independentlyby me): Sinus bradycardia 55 beats per minute. No ectopy.  Prior September,16 2014 ECG: Sinus bradycardia 48 beats per minute. Intervals normal  LABS: BMP Latest Ref Rng & Units 01/18/2016 01/09/2015 12/30/2012  Glucose 65 - 99 mg/dL 150(H) 149(H) 128(H)  BUN 7 - 25 mg/dL 29(H) 19 20  Creatinine 0.60 - 0.93 mg/dL 1.14(H) 1.04(H) 1.01  Sodium 135 - 146 mmol/L 139 138 138  Potassium 3.5 - 5.3 mmol/L 4.5 4.4 4.2  Chloride 98 - 110 mmol/L 108 104 103  CO2 20 - 31 mmol/L '20 26 27  '$ Calcium 8.6 - 10.4 mg/dL 9.9 10.3 10.1   Hepatic Function Latest Ref Rng & Units 01/18/2016 01/09/2015 09/29/2012  Total Protein 6.1 - 8.1 g/dL 6.6 6.9 7.2  Albumin 3.6 - 5.1 g/dL 4.1 4.1 4.3  AST 10 - 35 U/L '15 19 16  '$ ALT 6 - 29 U/L '17 27 21  '$ Alk Phosphatase 33 - 130 U/L 72 77 81  Total Bilirubin 0.2 - 1.2 mg/dL 0.4 0.6 0.5   CBC Latest Ref Rng & Units 01/09/2015 09/29/2012 12/14/2011  WBC 4.0 - 10.5 K/uL 7.9 8.3 8.0  Hemoglobin 12.0 - 15.0 g/dL 14.0 13.7 11.8(L)  Hematocrit 36.0 - 46.0 % 40.5 40.6 34.8(L)  Platelets 150 - 400 K/uL 353 360 262   Lab Results  Component Value Date   MCV 89.0 01/09/2015   MCV 89.8 09/29/2012   MCV 89.7 12/14/2011   Lab Results  Component Value Date   TSH 1.934 01/09/2015   Lab Results  Component Value Date   HGBA1C 7.1 (H) 01/09/2015   Lipid Panel     Component Value Date/Time   CHOL 150 01/18/2016 0823   TRIG 110 01/18/2016 0823   HDL 64 01/18/2016 0823   CHOLHDL 2.3 01/18/2016 0823   VLDL 22 01/18/2016 0823   LDLCALC 64 01/18/2016 0823   RADIOLOGY: No results found.    ASSESSMENT AND PLAN: Ms. Amey is a 70 year old female who continues to do well with reference to her known coronary artery disease.  She recently had developed some what atypical chest pain.  Nuclear perfusion study done in October 2017, was entirely normal.  I reviewed this with  her in detail today.  She has history of significant hypertension and on her current drug medical regimen including amlodipine 10 mg, hydrochlorothiazide 25 mg, Toprol-XL 50 mg and  Altace 10 mg  blood pressure today is well controlled. She is on atorvastatin 80 mg  and Zetia 10 mg for her hyperlipidemia.  His recent lipid panel is excellent.  She was felt to have  probable early diabetes several years ago and had been on dietary adjustment.  Recent laboratory had shown a BUN of 29 and a creatinine of 1.14.  She is not having any significant edema, but admits that she has leg swelling if she does not take her HCTZ.  I have suggested she reduce her dose from 25 mg and take 12.5 mg daily.  I've also recommended that she reduce her aspirin from 325 mg and take 81 mg.  She is obese with a BMI of 33.95.  I have recommended weight loss and exercise.  Ideally we discussed walking at least 5 days a week for at least 30 minutes.  She sees her primary physician every 3 months, who will also be rechecking laboratory.  As long as she is stable, I will see her in one year for reevaluation.  Time spent: 25 minutes  Troy Sine, MD, [name]  03/02/2016 7:08 PM

## 2016-03-18 ENCOUNTER — Other Ambulatory Visit: Payer: Self-pay | Admitting: Cardiovascular Disease

## 2016-03-18 NOTE — Telephone Encounter (Signed)
Rx(s) sent to pharmacy electronically.  

## 2016-09-24 ENCOUNTER — Other Ambulatory Visit: Payer: Self-pay | Admitting: Internal Medicine

## 2016-09-24 DIAGNOSIS — N6310 Unspecified lump in the right breast, unspecified quadrant: Secondary | ICD-10-CM

## 2016-10-28 ENCOUNTER — Ambulatory Visit: Payer: Medicare Other

## 2016-10-28 ENCOUNTER — Ambulatory Visit
Admission: RE | Admit: 2016-10-28 | Discharge: 2016-10-28 | Disposition: A | Discharge status: Home / Self Care | Payer: Medicare Other | Source: Ambulatory Visit | Attending: Internal Medicine | Admitting: Internal Medicine

## 2016-10-28 ENCOUNTER — Other Ambulatory Visit: Payer: Self-pay | Admitting: Internal Medicine

## 2016-10-28 DIAGNOSIS — Z1231 Encounter for screening mammogram for malignant neoplasm of breast: Secondary | ICD-10-CM

## 2016-10-28 DIAGNOSIS — N6310 Unspecified lump in the right breast, unspecified quadrant: Secondary | ICD-10-CM

## 2017-01-20 ENCOUNTER — Encounter: Payer: Self-pay | Admitting: Gastroenterology

## 2017-03-03 ENCOUNTER — Ambulatory Visit: Payer: Medicare Other | Admitting: Cardiovascular Disease

## 2017-03-03 ENCOUNTER — Encounter: Payer: Self-pay | Admitting: Cardiovascular Disease

## 2017-03-03 VITALS — BP 116/52 | HR 53 | Ht 65.0 in | Wt 195.6 lb

## 2017-03-03 DIAGNOSIS — Z9861 Coronary angioplasty status: Secondary | ICD-10-CM | POA: Diagnosis not present

## 2017-03-03 DIAGNOSIS — I10 Essential (primary) hypertension: Secondary | ICD-10-CM

## 2017-03-03 DIAGNOSIS — I2510 Atherosclerotic heart disease of native coronary artery without angina pectoris: Secondary | ICD-10-CM

## 2017-03-03 DIAGNOSIS — E785 Hyperlipidemia, unspecified: Secondary | ICD-10-CM

## 2017-03-03 DIAGNOSIS — E669 Obesity, unspecified: Secondary | ICD-10-CM

## 2017-03-03 NOTE — Patient Instructions (Signed)

## 2017-03-03 NOTE — Progress Notes (Signed)
Patient ID: Debbie [name], female   DOB: [birth year], 71 y.o.   MRN: [id number]    PCP: Dr. [facility]  HPI: Debbie [name], is a 71 y.o. female who presents to the office today for an 12 month  followup evaluation.  Ms. Kauzlarich has known CAD and in 2001 suffered an acute coronary syndrome and was found to have total occlusion of her RCA. She had 3 stents placed to her RCA. Subsequent catheterization in 2006 showed all 3 stents widely patent. Additionally, she has a history of hypertension as well as hyperlipidemia. She also has a history of atrial tachycardia. A nuclear perfusion study in September 2013 showed minimal anterior thinning not felt to be significant.  Because of mild possible claudication symptoms her lower extremity, she underwent a lower extremity arterial Doppler study which suggested mild arterial insufficiency with ABIs of 0.83 bilaterally.  When I  saw her in September 2014 laboratory suggested that she had evolved into type 2 diabetes mellitus. At that time her hemoglobin A1c was 7.0 and her fasting blood sugar was 128. She had normal renal function. She subsequently saw Dr. Lou Miner who did not initiate medical therapy but to pursue aggressive dietary adjustments initially.   She was evaluated in September 2017 by Kerin Ransom after experiencing left-sided chest pressure which was not exertional and oftentimes occurring at the night.  It did not radiate to her arms, jaw, or back.  She denied associated nausea, vomiting or diaphoresis.  She was referred for nuclear perfusion study which was done on 01/19/2016.  This was a normal study.  Ejection fraction was 58%.  She underwent blood work time and on her dose of Lipitor 80 mg and Zetia 10 mg total cholesterol was 150, triglycerides 110, HDL 64, and LDL 64. There was very minimal renal insufficiency with a creatinine of 1.14.  Her fasting glucose was 150.    Since I last saw her one year ago, she has remained stable.  She  denies chest pain, PND, orthopnea.  She is unaware of any episodes of presyncope or syncope.  She is not very active.  She states that when she walks her legs itch, and she has experienced this since her teenage years.  She denies any claudication-like symptoms.  She presents for follow-up cardiology evaluation.  Past Medical History:  Diagnosis Date  . CAD (coronary artery disease) Aug 2001   RCA stent X3, patent '06. Nuc low risk 9/13  . History of stress test 01/08/2012   Showed minimal anterior thinning not felt to be significant.  Marland Kitchen Hx of echocardiogram 12/12/2011   EF>55%  . Hypercholesteremia   . Hypertension   . PAF (paroxysmal atrial fibrillation) [facility]) Aug 2013   SSS component with some bradycardia  . Stroke [facility]) 1998    Past Surgical History:  Procedure Laterality Date  . CORONARY ANGIOGRAM  May 2006   patent stents  . CORONARY ANGIOPLASTY WITH STENT PLACEMENT  Aug 2001   X 3    No Known Allergies  Current Outpatient Medications  Medication Sig Dispense Refill  . amLODipine-atorvastatin (CADUET) 10-80 MG tablet take 1 tablet by mouth every evening 90 tablet 3  . aspirin EC 81 MG tablet Take 1 tablet (81 mg total) by mouth daily. 90 tablet 3  . ezetimibe (ZETIA) 10 MG tablet take 1 tablet by mouth once daily 90 tablet 3  . hydrochlorothiazide (HYDRODIURIL) 25 MG tablet take 1 tablet by mouth once daily 90 tablet  3  . metoprolol succinate (TOPROL-XL) 50 MG 24 hr tablet take 1 tablet by mouth once daily 90 tablet 3  . Multiple Vitamins-Minerals (CENTRUM SILVER ADULT 50+) TABS Take 1 tablet by mouth daily.    . ramipril (ALTACE) 10 MG capsule take 1 capsule by mouth once daily 90 capsule 3  . terbinafine (LAMISIL) 250 MG tablet Take 250 mg daily by mouth.  0  . nitroGLYCERIN (NITROSTAT) 0.4 MG SL tablet Place 1 tablet (0.4 mg total) under the tongue every 5 (five) minutes as needed for chest pain. 25 tablet 3   No current facility-administered medications for this  visit.     Socially she denies tobacco or alcohol. She does not routinely exercise.   ROS General: Negative; No fevers, chills, or night sweats;  HEENT: Negative; No changes in vision or hearing, sinus congestion, difficulty swallowing Pulmonary: Negative; No cough, wheezing, shortness of breath, hemoptysis Cardiovascular: Negative; No chest pain, presyncope, syncope, palpitations GI: Negative; No nausea, vomiting, diarrhea, or abdominal pain GU: Negative; No dysuria, hematuria, or difficulty voiding Musculoskeletal: Negative; no myalgias, joint pain, or weakness Hematologic/Oncology: Negative; no easy bruising, bleeding Endocrine: Negative; no heat/cold intolerance; no diabetes Neuro: Negative; no changes in balance, headaches Skin: Negative; No rashes or skin lesions Psychiatric: Negative; No behavioral problems, depression Sleep: Negative; No snoring, daytime sleepiness, hypersomnolence, bruxism, restless legs, hypnogognic hallucinations, no cataplexy Other comprehensive 14 point system review is negative.   PE BP (!) 116/52   Pulse (!) 53   Ht '5\' 5"'$  (1.651 m)   Wt 195 lb 9.6 oz (88.7 kg)   BMI 32.55 kg/m    Repeat blood pressure by me was 120/6  Wt Readings from Last 3 Encounters:  03/03/17 195 lb 9.6 oz (88.7 kg)  03/01/16 204 lb (92.5 kg)  01/19/16 200 lb (90.7 kg)    General: Alert, oriented, no distress.  Skin: normal turgor, no rashes, warm and dry HEENT: Normocephalic, atraumatic. Pupils equal round and reactive to light; sclera anicteric; extraocular muscles intact;  Nose without nasal septal hypertrophy Mouth/Parynx benign; Mallinpatti scale 3 Neck: No JVD, no carotid bruits; normal carotid upstroke Lungs: clear to ausculatation and percussion; no wheezing or rales Chest wall: without tenderness to palpitation Heart: PMI not displaced, RRR, s1 s2 normal, 1/6 systolic murmur, no diastolic murmur, no rubs, gallops, thrills, or heaves Abdomen: Mild central  adiposity.  soft, nontender; no hepatosplenomehaly, BS+; abdominal aorta nontender and not dilated by palpation. Back: no CVA tenderness Pulses 2+ Musculoskeletal: full range of motion, normal strength, no joint deformities Extremities: no clubbing, cyanosis or edema, Homan's sign negative  Neurologic: grossly nonfocal; Cranial nerves grossly wnl Psychologic: Normal mood and affect   ECG (independently read by me): Sinus bradycardia 53 with sinus arrhythmia.  Small inferior Q waves.  November 2017 ECG (independently read by me): Sinus bradycardia at 53 with mild sinus arrhythmia.  Nonspecific T changes.  September 2016 ECG (independently read by me): Sinus bradycardia 55 bpm.  No significant ST-T changes.  March 2015 ECG (independentlyby me): Sinus bradycardia 55 beats per minute. No ectopy.  Prior September,16 2014 ECG: Sinus bradycardia 48 beats per minute. Intervals normal  LABS: BMP Latest Ref Rng & Units 01/18/2016 01/09/2015 12/30/2012  Glucose 65 - 99 mg/dL 150(H) 149(H) 128(H)  BUN 7 - 25 mg/dL 29(H) 19 20  Creatinine 0.60 - 0.93 mg/dL 1.14(H) 1.04(H) 1.01  Sodium 135 - 146 mmol/L 139 138 138  Potassium 3.5 - 5.3 mmol/L 4.5 4.4 4.2  Chloride 98 -  110 mmol/L 108 104 103  CO2 20 - 31 mmol/L '20 26 27  '$ Calcium 8.6 - 10.4 mg/dL 9.9 10.3 10.1   Hepatic Function Latest Ref Rng & Units 01/18/2016 01/09/2015 09/29/2012  Total Protein 6.1 - 8.1 g/dL 6.6 6.9 7.2  Albumin 3.6 - 5.1 g/dL 4.1 4.1 4.3  AST 10 - 35 U/L '15 19 16  '$ ALT 6 - 29 U/L '17 27 21  '$ Alk Phosphatase 33 - 130 U/L 72 77 81  Total Bilirubin 0.2 - 1.2 mg/dL 0.4 0.6 0.5   CBC Latest Ref Rng & Units 01/09/2015 09/29/2012 12/14/2011  WBC 4.0 - 10.5 K/uL 7.9 8.3 8.0  Hemoglobin 12.0 - 15.0 g/dL 14.0 13.7 11.8(L)  Hematocrit 36.0 - 46.0 % 40.5 40.6 34.8(L)  Platelets 150 - 400 K/uL 353 360 262   Lab Results  Component Value Date   MCV 89.0 01/09/2015   MCV 89.8 09/29/2012   MCV 89.7 12/14/2011   Lab Results  Component  Value Date   TSH 1.934 01/09/2015   Lab Results  Component Value Date   HGBA1C 7.1 (H) 01/09/2015   Lipid Panel     Component Value Date/Time   CHOL 150 01/18/2016 0823   TRIG 110 01/18/2016 0823   HDL 64 01/18/2016 0823   CHOLHDL 2.3 01/18/2016 0823   VLDL 22 01/18/2016 0823   LDLCALC 64 01/18/2016 0823   RADIOLOGY: No results found.  IMPRESSION: 1. CAD S/P percutaneous coronary angioplasty   2. Essential hypertension   3. Hyperlipidemia with target LDL less than 70   4. Obesity (BMI 30.0-34.9)     ASSESSMENT AND PLAN: Ms. Strack is a 71 year old female who continues to do well with reference to her known coronary artery disease.  She is 17 years since suffering her acute coronary syndrome at which time she underwent insertion of 3 stents to RCA in 2001.  In October 2017 she developed some atypical recurrent chest pain.  Her nuclear study was unchanged from previously and remained normal.  Over the past year, she has continued to do well.  Her blood pressure today is stable on amlodipine 10 mg, HCTZ 25 mg, Toprol-XL 50 Mill[place] grams, and ramipril 10 mg daily.  Her lipids from one year ago her excellent with LDL of 64 on atorvastatin 80 mg and Zetia 10 mg.  She will be seeing her primary physician, Dr. Nancy Fetter at [facility] in the very near future and one year follow-up laboratory will be obtained.  Over the past year, she has lost 9 pounds to weight today at 195.  However, BMI is still consistent with obesity at 32.55.  I discussed increased activity.  We discussed walking at least 5 days per week for 30 minutes.  She states that she has had a chronic issue with leg itching with walking.  Her symptoms are not suggestive of claudication.  Her ECG shows sinus bradycardia with sinus arrhythmia.  She has preserved R waves inferiorly and small nondiagnostic Q waves from her initial inferior infarct.  I will see her in one year for follow-up evaluation.  Time spent: 25  minutes  Troy Sine, MD, [facility]  03/03/2017 9:13 AM

## 2017-04-11 ENCOUNTER — Other Ambulatory Visit: Payer: Self-pay

## 2017-04-11 MED ORDER — RAMIPRIL 10 MG PO CAPS: 10.0000 mg | ORAL_CAPSULE | Freq: Every day | ORAL | 3 refills | Status: DC

## 2017-04-11 MED ORDER — METOPROLOL SUCCINATE ER 50 MG PO TB24: 50.0000 mg | ORAL_TABLET | Freq: Every day | ORAL | 3 refills | Status: DC

## 2017-04-11 MED ORDER — HYDROCHLOROTHIAZIDE 25 MG PO TABS: 25.0000 mg | ORAL_TABLET | Freq: Every day | ORAL | 3 refills | Status: DC

## 2017-04-11 MED ORDER — AMLODIPINE-ATORVASTATIN 10-80 MG PO TABS: 1.0000 | ORAL_TABLET | Freq: Every evening | ORAL | 3 refills | Status: DC

## 2017-05-14 ENCOUNTER — Encounter: Payer: Self-pay | Admitting: Gastroenterology

## 2017-07-02 ENCOUNTER — Ambulatory Visit (AMBULATORY_SURGERY_CENTER): Payer: Self-pay | Admitting: *Deleted

## 2017-07-02 ENCOUNTER — Other Ambulatory Visit: Payer: Self-pay

## 2017-07-02 ENCOUNTER — Encounter: Payer: Self-pay | Admitting: Gastroenterology

## 2017-07-02 VITALS — Ht 65.5 in | Wt 201.4 lb

## 2017-07-02 DIAGNOSIS — Z1211 Encounter for screening for malignant neoplasm of colon: Secondary | ICD-10-CM

## 2017-07-02 MED ORDER — PEG 3350-KCL-NA BICARB-NACL 420 G PO SOLR: 4000.0000 mL | Freq: Once | ORAL | 0 refills | Status: AC

## 2017-07-02 NOTE — Progress Notes (Signed)
No egg or soy allergy known to patient  No issues with past sedation with any surgeries  or procedures, no intubation problems  No diet pills per patient No home 02 use per patient  No blood thinners per patient  Pt denies issues with constipation  Pt. Has had PAF on baby aspirin Hx of CVA and MI. EMMI video sent to pt's e mail

## 2017-07-16 ENCOUNTER — Ambulatory Visit (AMBULATORY_SURGERY_CENTER): Payer: Medicare Other | Admitting: Gastroenterology

## 2017-07-16 ENCOUNTER — Other Ambulatory Visit: Payer: Self-pay

## 2017-07-16 ENCOUNTER — Encounter: Payer: Self-pay | Admitting: Gastroenterology

## 2017-07-16 VITALS — BP 141/77 | HR 59 | Temp 98.6°F | Resp 21 | Ht 65.0 in | Wt 195.0 lb

## 2017-07-16 DIAGNOSIS — K649 Unspecified hemorrhoids: Secondary | ICD-10-CM

## 2017-07-16 DIAGNOSIS — Z1211 Encounter for screening for malignant neoplasm of colon: Secondary | ICD-10-CM

## 2017-07-16 DIAGNOSIS — D126 Benign neoplasm of colon, unspecified: Secondary | ICD-10-CM | POA: Diagnosis not present

## 2017-07-16 DIAGNOSIS — D125 Benign neoplasm of sigmoid colon: Secondary | ICD-10-CM

## 2017-07-16 DIAGNOSIS — K5730 Diverticulosis of large intestine without perforation or abscess without bleeding: Secondary | ICD-10-CM

## 2017-07-16 DIAGNOSIS — K635 Polyp of colon: Secondary | ICD-10-CM

## 2017-07-16 DIAGNOSIS — D123 Benign neoplasm of transverse colon: Secondary | ICD-10-CM | POA: Diagnosis not present

## 2017-07-16 MED ORDER — SODIUM CHLORIDE 0.9 % IV SOLN: 500.0000 mL | Freq: Once | INTRAVENOUS | Status: DC

## 2017-07-16 NOTE — Progress Notes (Signed)
Patient was having pain in her right foot and had xrays revealing some hairline fractures.. She is currently in a boot when she's ambulating. This information is new since her pre visit.

## 2017-07-16 NOTE — Op Note (Signed)
[place] Patient Name: Debbie [name] Procedure Date: 07/16/2017 10:38 AM MRN: [id number] Endoscopist: Milus Banister , MD Age: 72 Referring MD:  Date of Birth: 11-17-1945 Gender: Female Account #: [id number] Procedure:                Colonoscopy Indications:              Screening for colorectal malignant neoplasm;                            colonoscopy 12/2006 no polyps Medicines:                Monitored Anesthesia Care Procedure:                Pre-Anesthesia Assessment:                           - Prior to the procedure, a History and Physical                            was performed, and patient medications and                            allergies were reviewed. The patient's tolerance of                            previous anesthesia was also reviewed. The risks                            and benefits of the procedure and the sedation                            options and risks were discussed with the patient.                            All questions were answered, and informed consent                            was obtained. Prior Anticoagulants: The patient has                            taken no previous anticoagulant or antiplatelet                            agents. ASA Grade Assessment: II - A patient with                            mild systemic disease. After reviewing the risks                            and benefits, the patient was deemed in                            satisfactory condition to undergo the procedure.  After obtaining informed consent, the colonoscope                            was passed under direct vision. Throughout the                            procedure, the patient's blood pressure, pulse, and                            oxygen saturations were monitored continuously. The                            Colonoscope was introduced through the anus and                            advanced to the the cecum, identified by                             appendiceal orifice and ileocecal valve. The                            colonoscopy was performed without difficulty. The                            patient tolerated the procedure well. The quality                            of the bowel preparation was good. The ileocecal                            valve, appendiceal orifice, and rectum were                            photographed. Scope In: 10:45:53 AM Scope Out: 11:02:49 AM Scope Withdrawal Time: 0 hours 13 minutes 47 seconds  Total Procedure Duration: 0 hours 16 minutes 56 seconds  Findings:                 Four sessile polyps were found in the sigmoid colon                            and transverse colon. The polyps were 2 to 4 mm in                            size. These polyps were removed with a cold snare.                            Resection and retrieval were complete.                           Multiple small and large-mouthed diverticula were                            found in the entire colon.  Internal hemorrhoids were found. The hemorrhoids                            were small.                           The exam was otherwise without abnormality on                            direct and retroflexion views. Complications:            No immediate complications. Estimated blood loss:                            None. Estimated Blood Loss:     Estimated blood loss: none. Impression:               - Four 2 to 4 mm polyps in the sigmoid colon and in                            the transverse colon, removed with a cold snare.                            Resected and retrieved.                           - Diverticulosis in the entire examined colon.                           - Internal hemorrhoids.                           - The examination was otherwise normal on direct                            and retroflexion views. Recommendation:           - Patient has a contact number  available for                            emergencies. The signs and symptoms of potential                            delayed complications were discussed with the                            patient. Return to normal activities tomorrow.                            Written discharge instructions were provided to the                            patient.                           - Resume previous diet.                           -  Continue present medications.                           You will receive a letter within 2-3 weeks with the                            pathology results and my final recommendations.                           If the polyp(s) is proven to be 'pre-cancerous' on                            pathology, you will need repeat colonoscopy in 3-5                            years. If the polyp(s) is NOT 'precancerous' on                            pathology then you should repeat colon cancer                            screening in 10 years with colonoscopy without need                            for colon cancer screening by any method prior to                            then (including stool testing). Milus Banister, MD 07/16/2017 11:05:33 AM This report has been signed electronically.

## 2017-07-16 NOTE — Patient Instructions (Signed)
INFORMATION ON POLYPS GIVEN.   YOU HAD AN ENDOSCOPIC PROCEDURE TODAY AT THE [facility]:   Refer to the procedure report that was given to you for any specific questions about what was found during the examination.  If the procedure report does not answer your questions, please call your gastroenterologist to clarify.  If you requested that your care partner not be given the details of your procedure findings, then the procedure report has been included in a sealed envelope for you to review at your convenience later.  YOU SHOULD EXPECT: Some feelings of bloating in the abdomen. Passage of more gas than usual.  Walking can help get rid of the air that was put into your GI tract during the procedure and reduce the bloating. If you had a lower endoscopy (such as a colonoscopy or flexible sigmoidoscopy) you may notice spotting of blood in your stool or on the toilet paper. If you underwent a bowel prep for your procedure, you may not have a normal bowel movement for a few days.  Please Note:  You might notice some irritation and congestion in your nose or some drainage.  This is from the oxygen used during your procedure.  There is no need for concern and it should clear up in a day or so.  SYMPTOMS TO REPORT IMMEDIATELY:   Following lower endoscopy (colonoscopy or flexible sigmoidoscopy):  Excessive amounts of blood in the stool  Significant tenderness or worsening of abdominal pains  Swelling of the abdomen that is new, acute  Fever of 100F or higher    For urgent or emergent issues, a gastroenterologist can be reached at any hour by calling [phone].   DIET:  We do recommend a small meal at first, but then you may proceed to your regular diet.  Drink plenty of fluids but you should avoid alcoholic beverages for 24 hours.  ACTIVITY:  You should plan to take it easy for the rest of today and you should NOT DRIVE or use heavy machinery until tomorrow (because of the sedation  medicines used during the test).    FOLLOW UP: Our staff will call the number listed on your records the next business day following your procedure to check on you and address any questions or concerns that you may have regarding the information given to you following your procedure. If we do not reach you, we will leave a message.  However, if you are feeling well and you are not experiencing any problems, there is no need to return our call.  We will assume that you have returned to your regular daily activities without incident.  If any biopsies were taken you will be contacted by phone or by letter within the next 1-3 weeks.  Please call us at [phone] if you have not heard about the biopsies in 3 weeks.    SIGNATURES/CONFIDENTIALITY: You and/or your care partner have signed paperwork which will be entered into your electronic medical record.  These signatures attest to the fact that that the information above on your After Visit Summary has been reviewed and is understood.  Full responsibility of the confidentiality of this discharge information lies with you and/or your care-partner. 

## 2017-07-16 NOTE — Progress Notes (Signed)
Called to room to assist during endoscopic procedure.  Patient ID and intended procedure confirmed with present staff. Received instructions for my participation in the procedure from the performing physician.  

## 2017-07-16 NOTE — Progress Notes (Signed)
Report given to PACU, vss 

## 2017-07-17 ENCOUNTER — Telehealth: Payer: Self-pay

## 2017-07-17 NOTE — Telephone Encounter (Signed)
  Follow up Call-  Call back number 07/16/2017  Post procedure Call Back phone  # [phone]  Permission to leave phone message Yes  Some recent data might be hidden     Patient questions:  Do you have a fever, pain , or abdominal swelling? No. Pain Score  0 *  Have you tolerated food without any problems? Yes.    Have you been able to return to your normal activities? Yes.    Do you have any questions about your discharge instructions: Diet   No. Medications  No. Follow up visit  No.  Do you have questions or concerns about your Care? No.  Actions: * If pain score is 4 or above: No action needed, pain <4.

## 2017-07-22 ENCOUNTER — Encounter: Payer: Self-pay | Admitting: Gastroenterology

## 2017-08-07 ENCOUNTER — Other Ambulatory Visit: Payer: Self-pay

## 2017-08-07 MED ORDER — EZETIMIBE 10 MG PO TABS: 10.0000 mg | ORAL_TABLET | Freq: Every day | ORAL | 2 refills | Status: DC

## 2017-08-07 NOTE — Telephone Encounter (Signed)
REFILL 

## 2017-08-13 ENCOUNTER — Other Ambulatory Visit: Payer: Self-pay

## 2017-08-13 MED ORDER — EZETIMIBE 10 MG PO TABS: 10.0000 mg | ORAL_TABLET | Freq: Every day | ORAL | 2 refills | Status: DC

## 2017-09-16 DIAGNOSIS — M25551 Pain in right hip: Secondary | ICD-10-CM | POA: Insufficient documentation

## 2017-09-26 ENCOUNTER — Other Ambulatory Visit: Payer: Self-pay | Admitting: Internal Medicine

## 2017-09-26 DIAGNOSIS — Z1231 Encounter for screening mammogram for malignant neoplasm of breast: Secondary | ICD-10-CM

## 2017-10-28 DIAGNOSIS — M7061 Trochanteric bursitis, right hip: Secondary | ICD-10-CM | POA: Insufficient documentation

## 2017-10-30 ENCOUNTER — Ambulatory Visit: Payer: Medicare Other

## 2017-11-05 ENCOUNTER — Encounter: Payer: Self-pay | Admitting: *Deleted

## 2017-11-06 ENCOUNTER — Encounter

## 2017-11-06 ENCOUNTER — Encounter: Payer: Self-pay | Admitting: Neurology

## 2017-11-06 ENCOUNTER — Ambulatory Visit: Payer: Medicare Other | Admitting: Neurology

## 2017-11-06 ENCOUNTER — Telehealth: Payer: Self-pay | Admitting: Neurology

## 2017-11-06 ENCOUNTER — Other Ambulatory Visit: Payer: Self-pay

## 2017-11-06 VITALS — BP 110/60 | HR 65 | Ht 65.0 in | Wt 199.2 lb

## 2017-11-06 DIAGNOSIS — R51 Headache: Secondary | ICD-10-CM

## 2017-11-06 DIAGNOSIS — M5481 Occipital neuralgia: Secondary | ICD-10-CM | POA: Diagnosis not present

## 2017-11-06 DIAGNOSIS — R519 Headache, unspecified: Secondary | ICD-10-CM

## 2017-11-06 DIAGNOSIS — M542 Cervicalgia: Secondary | ICD-10-CM

## 2017-11-06 NOTE — Telephone Encounter (Signed)
[employer] Medicare order sent to GI. No auth they will reach out to the pt to schedule.  °

## 2017-11-06 NOTE — Progress Notes (Signed)
[facility]    Provider:  Dr Jaynee Eagles Referring Provider: Sandi Mariscal, MD Primary Care Physician:  Sandi Mariscal, MD  CC:  Pains in head  HPI:  Debbie [name] is a 72 y.o. female here as a referral from Dr. Nancy Fetter for head pain.  Past medical history hypertension, high cholesterol, vitamin D deficiency, cerebrovascular accident. She has shooting pains in her head in the occipital area, shoots up from the neck to her head. She feels improved, it comes intermittently. Brief, severe. Happens 1x a week. Then may not happen for a while. Ongoing for a year or 2, stable. No inciting events. No known triggers. Noticing changes in the shape of her head. No neck pain. Can be severe. No other focal neurologic deficits, associated symptoms, inciting events or modifiable factors. Also has leg pain that started a few days ago, different than the hip pain she has aching and burning pain starting from the right thigh all the way down to the foot.   Medications tried: Metoprolol, amlodipine,  Reviewed notes, labs and imaging from outside physicians, which showed:  Reviewed referring notes.  She presents for migraine headaches.  Headache is located in the left frontal area and right frontal area radiates to the temporal areas.  The pain is achy onset was several weeks prior to her appointment in May.  Symptoms occur constantly.  Symptoms exacerbated by movement, not exacerbated by noise, light and associated symptoms none clear vertigo or difficulty swallowing.  Patient is tried Advil, Tylenol, and Aleve for symptoms.  RF neg, creat 1.2, bun 19, hep panel neg, vit d 20,   Review of Systems: Patient complains of symptoms per HPI as well as the following symptoms: Headache, snoring. Pertinent negatives and positives per HPI. All others negative.   Social History   Socioeconomic History  . Marital status: Married    Spouse name: Debbie [name]  . Number of children: 1  . Years of education: 7  . Highest  education level: Not on file  Occupational History  . Not on file  Social Needs  . Financial resource strain: Not on file  . Food insecurity:    Worry: Not on file    Inability: Not on file  . Transportation needs:    Medical: Not on file    Non-medical: Not on file  Tobacco Use  . Smoking status: Current Some Day Smoker    Years: 60.00    Types: Cigarettes  . Smokeless tobacco: Never Used  . Tobacco comment: 2-3 cigarettes per day  Substance and Sexual Activity  . Alcohol use: Not Currently  . Drug use: No  . Sexual activity: Not on file  Lifestyle  . Physical activity:    Days per week: Not on file    Minutes per session: Not on file  . Stress: Not on file  Relationships  . Social connections:    Talks on phone: Not on file    Gets together: Not on file    Attends religious service: Not on file    Active member of club or organization: Not on file    Attends meetings of clubs or organizations: Not on file    Relationship status: Not on file  . Intimate partner violence:    Fear of current or ex partner: Not on file    Emotionally abused: Not on file    Physically abused: Not on file    Forced sexual activity: Not on file  Other Topics Concern  .  Not on file  Social History Narrative   Right handed   Lives with husband   Caffeine use: Tea twice per week    Family History  Problem Relation Age of Onset  . Diabetes type II Sister   . Heart disease Sister   . Colon cancer Neg Hx   . Colon polyps Neg Hx   . Esophageal cancer Neg Hx   . Rectal cancer Neg Hx   . Stomach cancer Neg Hx     Past Medical History:  Diagnosis Date  . Arthritis   . CAD (coronary artery disease) Aug 2001   RCA stent X3, patent '06. Nuc low risk 9/13  . Cataract    beginning stages  . History of stress test 01/08/2012   Showed minimal anterior thinning not felt to be significant.  Marland Kitchen Hx of echocardiogram 12/12/2011   EF>55%  . Hypercholesteremia   . Hypertension   . Myocardial  infarction ([place])    78 years ago    age 13  . PAF (paroxysmal atrial fibrillation) [facility]) Aug 2013   SSS component with some bradycardia  . Stroke ([place]) 1998  . Vitamin D deficiency     Past Surgical History:  Procedure Laterality Date  . CHOLECYSTECTOMY    . COLONOSCOPY    . CORONARY ANGIOGRAM  May 2006   patent stents  . CORONARY ANGIOPLASTY WITH STENT PLACEMENT  Aug 2001   X 3    Current Outpatient Medications  Medication Sig Dispense Refill  . amLODipine-atorvastatin (CADUET) 10-80 MG tablet Take 1 tablet by mouth every evening. 90 tablet 3  . aspirin EC 81 MG tablet Take 1 tablet (81 mg total) by mouth daily. 90 tablet 3  . ezetimibe (ZETIA) 10 MG tablet Take 1 tablet (10 mg total) by mouth daily. 90 tablet 2  . hydrochlorothiazide (HYDRODIURIL) 25 MG tablet Take 1 tablet (25 mg total) by mouth daily. 90 tablet 3  . metoprolol succinate (TOPROL-XL) 50 MG 24 hr tablet Take 1 tablet (50 mg total) by mouth daily. Take with or immediately following a meal. 90 tablet 3  . Multiple Vitamins-Minerals (CENTRUM SILVER ADULT 50+) TABS Take 1 tablet by mouth daily.    . ramipril (ALTACE) 10 MG capsule Take 1 capsule (10 mg total) by mouth daily. 90 capsule 3  . terbinafine (LAMISIL) 250 MG tablet Take 250 mg daily by mouth.  0  . nitroGLYCERIN (NITROSTAT) 0.4 MG SL tablet Place 1 tablet (0.4 mg total) under the tongue every 5 (five) minutes as needed for chest pain. 25 tablet 3   Current Facility-Administered Medications  Medication Dose Route Frequency Provider Last Rate Last Dose  . 0.9 %  sodium chloride infusion  500 mL Intravenous Once Milus Banister, MD        Allergies as of 11/06/2017  . (No Known Allergies)    Vitals: BP 110/60 (BP Location: Right Arm, Patient Position: Sitting, Cuff Size: Normal)   Pulse 65   Ht 5\' 5"  (1.651 m)   Wt 199 lb 3.2 oz (90.4 kg)   SpO2 97%   BMI 33.15 kg/m  Last Weight:  Wt Readings from Last 1 Encounters:  11/06/17 199 lb 3.2 oz (90.4  kg)   Last Height:   Ht Readings from Last 1 Encounters:  11/06/17 5\' 5"  (1.651 m)   Physical exam: Exam: Gen: NAD, conversant, well nourised, obese, well groomed  CV: RRR, no MRG. No Carotid Bruits. No peripheral edema, warm, nontender Eyes: Conjunctivae clear without exudates or hemorrhage  Neuro: Detailed Neurologic Exam  Speech:    Speech is normal; fluent and spontaneous with normal comprehension.  Cognition:    The patient is oriented to person, place, and time;     recent and remote memory intact;     language fluent;     normal attention, concentration,     fund of knowledge Cranial Nerves:    The pupils are equal, round, and reactive to light. The fundi are normal and spontaneous venous pulsations are present. Visual fields are full to finger confrontation. Extraocular movements are intact. Trigeminal sensation is intact and the muscles of mastication are normal. The face is symmetric. The palate elevates in the midline. Hearing intact. Voice is normal. Shoulder shrug is normal. The tongue has normal motion without fasciculations.   Coordination:    Normal finger to nose and heel to shin. Normal rapid alternating movements.   Gait:    Heel-toe and tandem gait are normal.   Motor Observation:    No asymmetry, no atrophy, and no involuntary movements noted. Tone:    Normal muscle tone.    Posture:    Posture is normal. normal erect    Strength:    Right > left proximal wealkness may be more due to pain     Sensation: intact to LT     Reflex Exam:  DTR's:    Deep tendon reflexes in the upper and lower extremities are normal bilaterally.   Toes:    The toes are downgoing bilaterally.   Clonus:    Clonus is absent.    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 Neuro exam in non focal. Pain is bilateral and is located in the distribution of the greater, lesser and/or third occipital nerves, paroxysmal and brief,  painful, sharp, with tenderness and trigger points at the emergence of the greater occipital nerve. Differential includes pain in the upper cervical joints or disks, suboccipital or upper posterior neck muscles including the traps/scm, spinal and posterior cranial fossa dura mater, vertebral arteries, structural and infiltrative lesions such as meningioma, schwannoma, myelitis, compressive disk disease and others. Will need MRI of the brain and cervical spine. Will restart meloxicam. Can consider occipital nerve blocks after imaging results.   Occipital neuralgia: MRI of the brain w/wo contrast Discussed treatments, medication, nerve blocks will hold off for now Recommend PT, she declines  Right leg pain: For her right leg recommend surveillance just started a few days ago and she says the pain is gone today, also been referred to ortho, discussed fall precautions  Orders Placed This Encounter  Procedures  . MR BRAIN W WO CONTRAST  . Basic Metabolic Panel     Cc: Dr. Olena Mater, [facility] [address] [place], [place] [address]  Phone [phone] Fax [phone]

## 2017-11-06 NOTE — Patient Instructions (Signed)
Occipital Neuralgia Occipital neuralgia is a type of headache that causes episodes of very bad pain in the back of your head. Pain from occipital neuralgia may spread (radiate) to other parts of your head. The pain is usually brief and often goes away after you rest and relax. These headaches may be caused by irritation of the nerves that leave your spinal cord high up in your neck, just below the base of your skull (occipital nerves). Your occipital nerves transmit sensations from the back of your head, the top of your head, and the areas behind your ears. What are the causes? Occipital neuralgia can occur without any known cause (primary headache syndrome). In other cases, occipital neuralgia is caused by pressure on or irritation of one of the two occipital nerves. Causes of occipital nerve compression or irritation include:  Wear and tear of the vertebrae in the neck (osteoarthritis).  Neck injury.  Disease of the disks that separate the vertebrae.  Tumors.  Gout.  Infections.  Diabetes.  Swollen blood vessels that put pressure on the occipital nerves.  Muscle spasm in the neck.  What are the signs or symptoms? Pain is the main symptom of occipital neuralgia. It usually starts in the back of the head but may also be felt in other areas supplied by the occipital nerves. Pain is usually on one side but may be on both sides. You may have:  Brief episodes of very bad pain that is burning, stabbing, shocking, or shooting.  Pain behind the eye.  Pain triggered by neck movement or hair brushing.  Scalp tenderness.  Aching in the back of the head between episodes of very bad pain.  How is this diagnosed? Your health care provider may diagnose occipital neuralgia based on your symptoms and a physical exam. During the exam, the health care provider may push on areas supplied by the occipital nerves to see if they are painful. Some tests may also be done to help in making the  diagnosis. These may include:  Imaging studies of the upper spinal cord, such as an MRI or CT scan. These may show compression or spinal cord abnormalities.  Nerve block. You will get an injection of numbing medicine (local anesthetic) near the occipital nerve to see if this relieves pain.  How is this treated? Treatment may begin with simple measures, such as:  Rest.  Massage.  Heat.  Over-the-counter pain relievers.  If these measures do not work, you may need other treatments, including:  Medicines such as: ? Prescription-strength anti-inflammatory medicines. ? Muscle relaxants. ? Antiseizure medicines. ? Antidepressants.  Steroid injection. This involves injections of local anesthetic and strong anti-inflammatory drugs (steroids).  Pulsed radiofrequency. Wires are implanted to deliver electrical impulses that block pain signals from the occipital nerve.  Physical therapy.  Surgery to relieve nerve pressure.  Follow these instructions at home:  Take all medicines as directed by your health care provider.  Avoid activities that cause pain.  Rest when you have an attack of pain.  Try gentle massage or a heating pad to relieve pain.  Work with a physical therapist to learn stretching exercises you can do at home.  Try a different pillow or sleeping position.  Practice good posture.  Try to stay active. Get regular exercise that does not cause pain. Ask your health care provider to suggest safe exercises for you.  Keep all follow-up visits as directed by your health care provider. This is important. Contact a health care provider if:    Your medicine is not working.  You have new or worsening symptoms. Get help right away if:  You have very bad head pain that is not going away.  You have a sudden change in vision, balance, or speech. This information is not intended to replace advice given to you by your health care provider. Make sure you discuss any  questions you have with your health care provider. Document Released: 03/26/2001 Document Revised: 09/07/2015 Document Reviewed: 03/24/2013 Elsevier Interactive Patient Education  2017 Elsevier Inc.  

## 2017-11-07 LAB — BASIC METABOLIC PANEL
BUN/Creatinine Ratio: 17 (ref 12–28)
BUN: 19 mg/dL (ref 8–27)
CO2: 21 mmol/L (ref 20–29)
CREATININE: 1.14 mg/dL — AB (ref 0.57–1.00)
Calcium: 10.1 mg/dL (ref 8.7–10.3)
Chloride: 106 mmol/L (ref 96–106)
GFR calc Af Amer: 55 mL/min/{1.73_m2} — ABNORMAL LOW (ref >59)
GFR calc non Af Amer: 48 mL/min/{1.73_m2} — ABNORMAL LOW (ref >59)
GLUCOSE: 144 mg/dL — AB (ref 65–99)
Potassium: 4.2 mmol/L (ref 3.5–5.2)
Sodium: 141 mmol/L (ref 134–144)

## 2017-11-11 ENCOUNTER — Telehealth: Payer: Self-pay | Admitting: *Deleted

## 2017-11-11 NOTE — Telephone Encounter (Addendum)
Called pt and informed her that recent basic metabolic panel is stable. Pt verbalized understanding and appreciation.   ----- Message from Melvenia Beam, MD sent at 11/07/2017  7:57 AM EDT ----- Bmp stable thanks

## 2017-11-25 ENCOUNTER — Ambulatory Visit
Admission: RE | Admit: 2017-11-25 | Discharge: 2017-11-25 | Disposition: A | Discharge status: Home / Self Care | Payer: Medicare Other | Source: Ambulatory Visit | Attending: Neurology | Admitting: Neurology

## 2017-11-25 DIAGNOSIS — R519 Headache, unspecified: Secondary | ICD-10-CM

## 2017-11-25 DIAGNOSIS — M542 Cervicalgia: Secondary | ICD-10-CM

## 2017-11-25 DIAGNOSIS — R51 Headache: Secondary | ICD-10-CM

## 2017-11-25 DIAGNOSIS — M5481 Occipital neuralgia: Secondary | ICD-10-CM

## 2017-11-25 MED ORDER — GADOBENATE DIMEGLUMINE 529 MG/ML IV SOLN
18.0000 mL | Freq: Once | INTRAVENOUS | Status: AC | PRN
  Administered 2017-11-25: 18 mL via INTRAVENOUS

## 2017-12-01 ENCOUNTER — Telehealth: Payer: Self-pay | Admitting: *Deleted

## 2017-12-01 ENCOUNTER — Other Ambulatory Visit: Payer: Self-pay | Admitting: Neurology

## 2017-12-01 ENCOUNTER — Telehealth: Payer: Self-pay | Admitting: Neurology

## 2017-12-01 DIAGNOSIS — I720 Aneurysm of carotid artery: Secondary | ICD-10-CM

## 2017-12-01 DIAGNOSIS — I6529 Occlusion and stenosis of unspecified carotid artery: Secondary | ICD-10-CM

## 2017-12-01 NOTE — Telephone Encounter (Signed)
Thanks emma! Orders placed.

## 2017-12-01 NOTE — Telephone Encounter (Addendum)
Called and left detailed message about results. Asked pt to call back to let us know if she would like to proceed with CT head/neck w/wo contrast based on MRI findings per Dr. Jaynee Eagles. Gave [facility] phone number.

## 2017-12-01 NOTE — Telephone Encounter (Signed)
-----   Message from Melvenia Beam, MD sent at 12/01/2017  9:22 AM EDT ----- MRI of the brain showed her old stroke otherwise nothing new in the brain. I want to examine her blood vessels however, there was a question of possible blockage of a vessel or aneurysm on MRI brain. I want to reassure patient MRI of the brain is not good to look at blood vessels so this may be nothing but we should check with a CTA head and neck both w and wo contrast if she is willing. Let m Darlis Loan

## 2017-12-01 NOTE — Telephone Encounter (Signed)
Dr. Jaynee Eagles- can you please place order for CT head/neck w/wo please? I spoke with patient about MRI results and she is agreeable to test. Thank you!

## 2017-12-01 NOTE — Telephone Encounter (Signed)
Patient did get MRI results and would like to proceed with CT head/neck w/wo contrast.

## 2017-12-01 NOTE — Telephone Encounter (Signed)
Called patient back. She is agreeable to have CT head/neck w/wo contrast. Advised we will place order and she will get a call to schedule once they have insurance approval. She would like to go to [facility]. She had her MRI there.

## 2017-12-01 NOTE — Telephone Encounter (Signed)
[employer] Medicare order sent to GI. No auth they will reach out to the pt to schedule.  °

## 2017-12-30 ENCOUNTER — Ambulatory Visit
Admission: RE | Admit: 2017-12-30 | Discharge: 2017-12-30 | Disposition: A | Discharge status: Home / Self Care | Payer: Medicare Other | Source: Ambulatory Visit | Attending: Internal Medicine | Admitting: Internal Medicine

## 2017-12-30 DIAGNOSIS — Z1231 Encounter for screening mammogram for malignant neoplasm of breast: Secondary | ICD-10-CM

## 2017-12-30 IMAGING — MG DIGITAL SCREENING BILATERAL MAMMOGRAM WITH TOMO AND CAD
6 of 10 series · 6 of 30 positions shown · non-contrast
Comparison: Previous exam(s).

CLINICAL DATA: Screening.

EXAM:
DIGITAL SCREENING BILATERAL MAMMOGRAM WITH TOMO AND CAD

[R MLO synth-2D (1 of 2)]
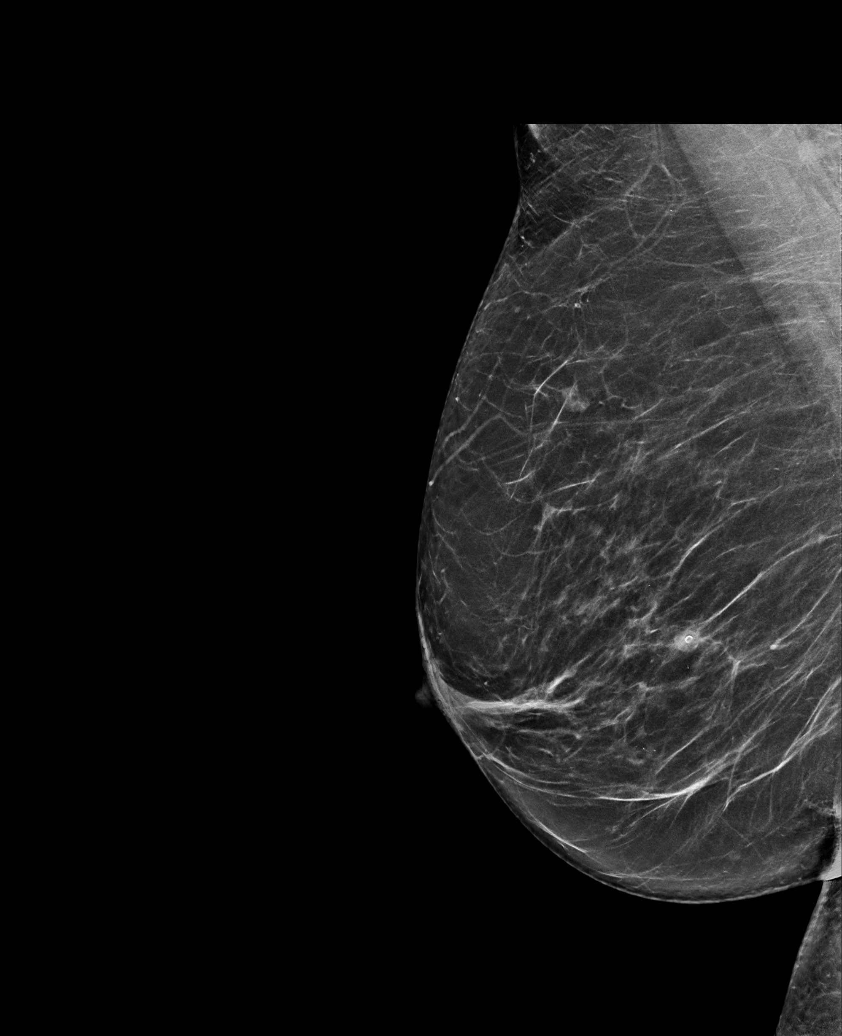

[L MLO synth-2D]
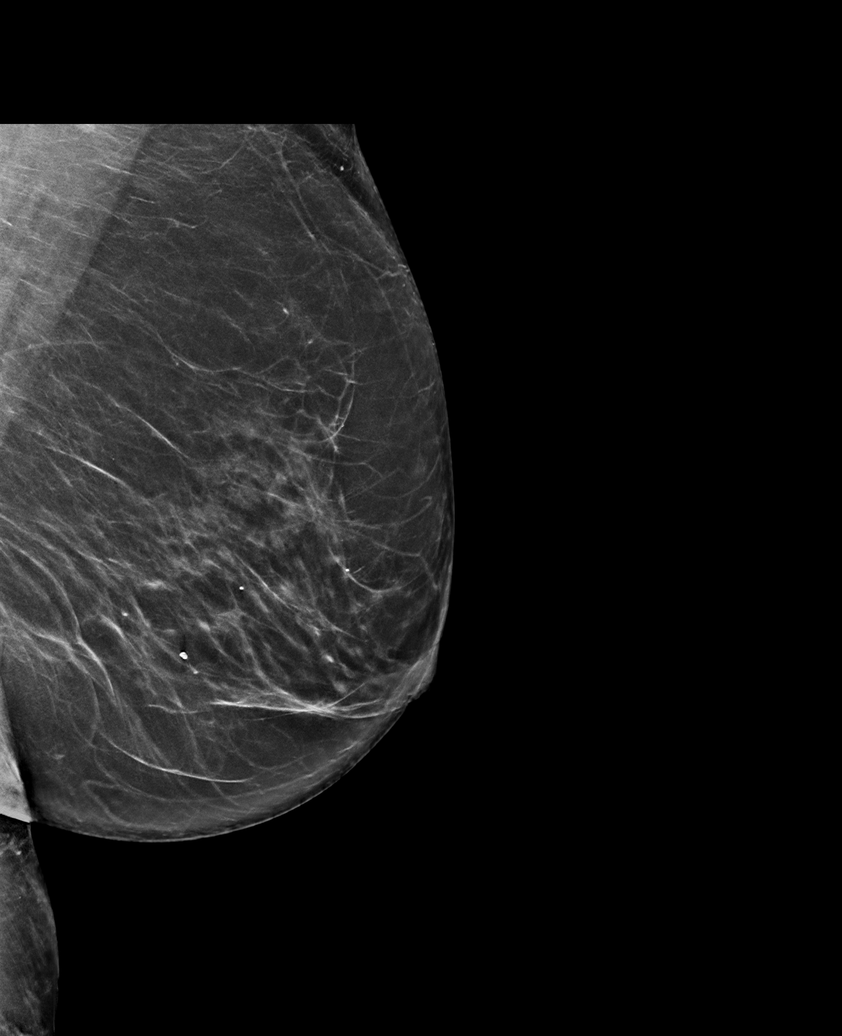

[L CC synth-2D]
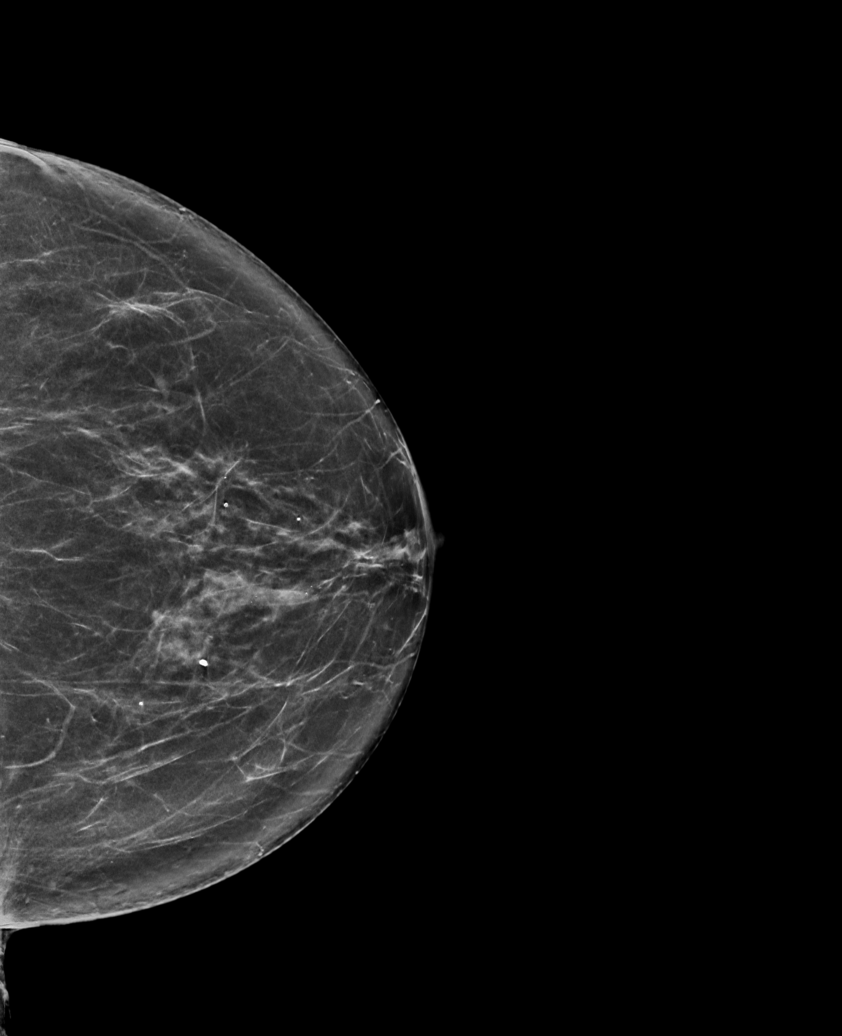

[R MLO synth-2D (2 of 2)]
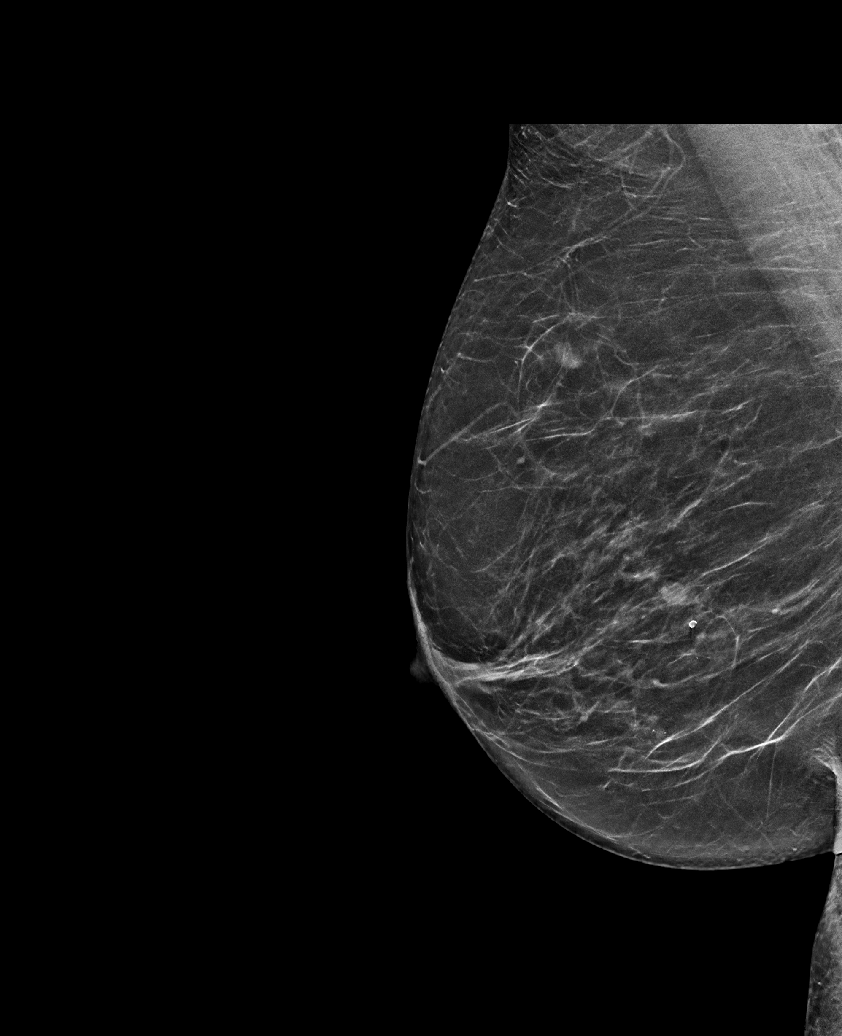

[R CC synth-2D]
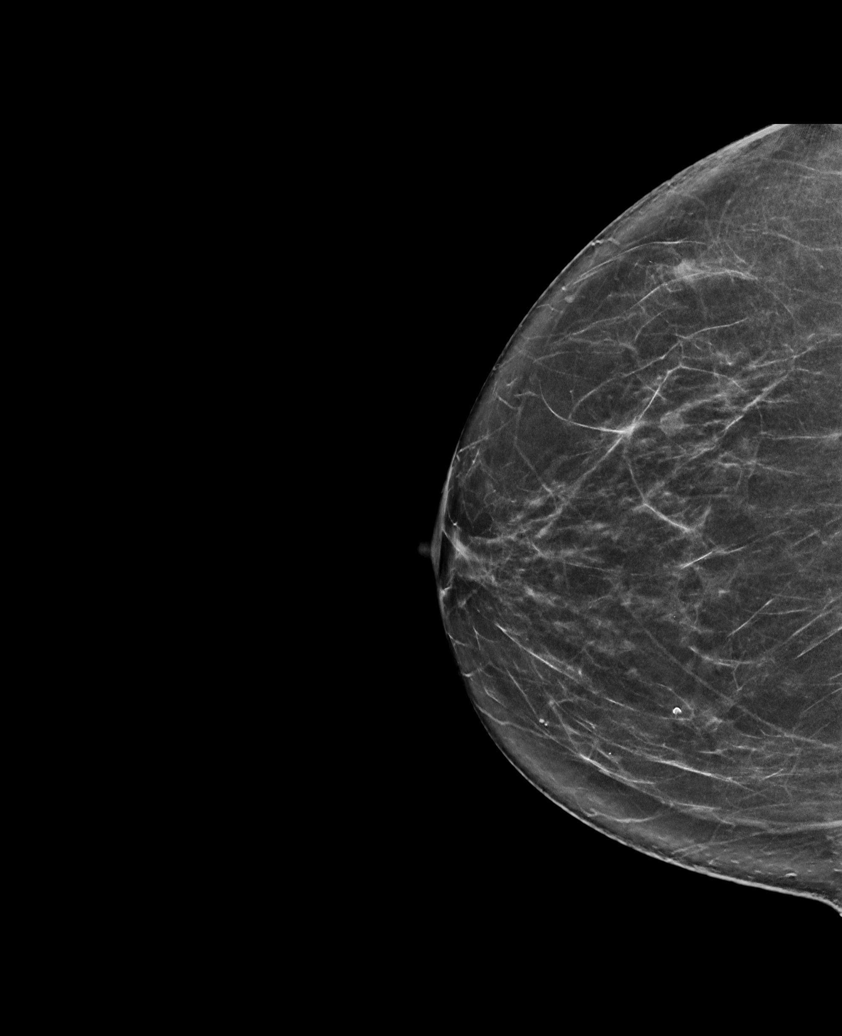

[R MLO tomo · tomo slice 36/71.0]
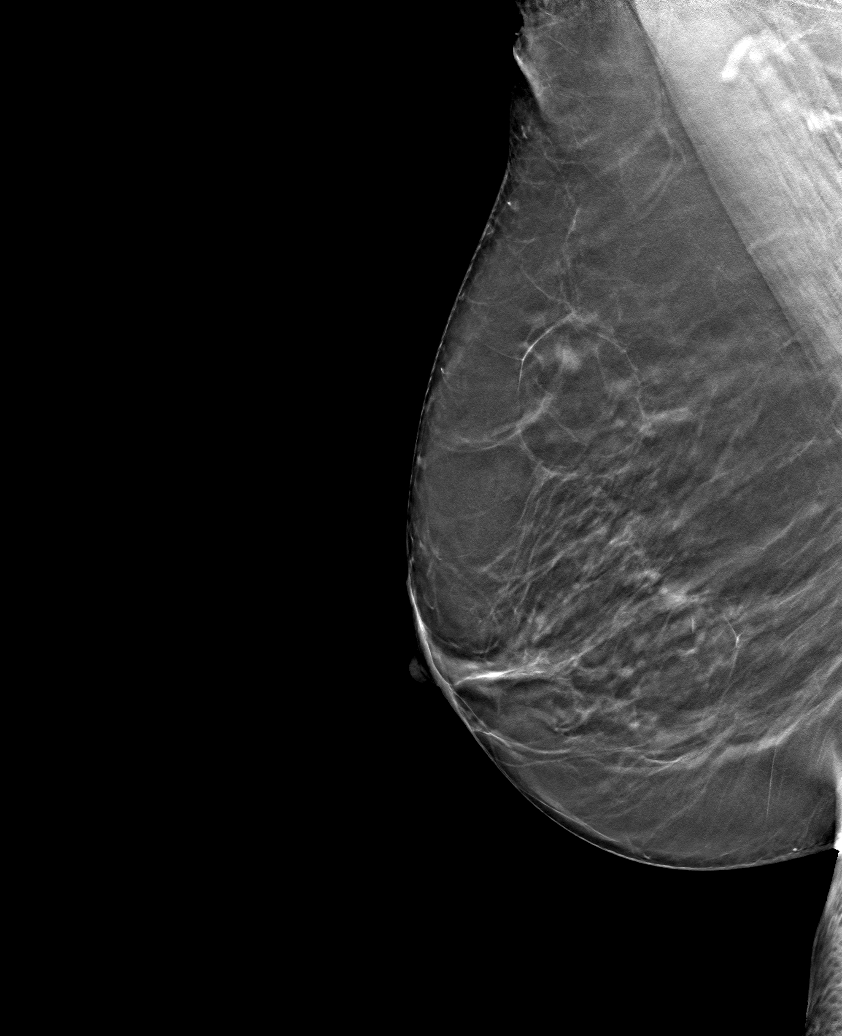

[6 of 30 positions shown; findings below may reference images not displayed]

ACR Breast Density Category b: There are scattered areas of
fibroglandular density.
FINDINGS: There are no findings suspicious for malignancy. Images were
processed with CAD.
IMPRESSION: No mammographic evidence of malignancy. A result letter of this
screening mammogram will be mailed directly to the patient.

RECOMMENDATION:
Screening mammogram in one year. (Code:[TQ])

BI-RADS CATEGORY  1: Negative.

## 2018-03-26 ENCOUNTER — Ambulatory Visit: Payer: Medicare Other | Admitting: Cardiovascular Disease

## 2018-03-26 ENCOUNTER — Encounter: Payer: Self-pay | Admitting: Cardiovascular Disease

## 2018-03-26 VITALS — BP 126/70 | HR 59 | Ht 65.0 in | Wt 202.4 lb

## 2018-03-26 DIAGNOSIS — E669 Obesity, unspecified: Secondary | ICD-10-CM

## 2018-03-26 DIAGNOSIS — I2510 Atherosclerotic heart disease of native coronary artery without angina pectoris: Secondary | ICD-10-CM | POA: Diagnosis not present

## 2018-03-26 DIAGNOSIS — E785 Hyperlipidemia, unspecified: Secondary | ICD-10-CM

## 2018-03-26 DIAGNOSIS — I10 Essential (primary) hypertension: Secondary | ICD-10-CM

## 2018-03-26 DIAGNOSIS — I498 Other specified cardiac arrhythmias: Secondary | ICD-10-CM | POA: Diagnosis not present

## 2018-03-26 DIAGNOSIS — Z9861 Coronary angioplasty status: Secondary | ICD-10-CM

## 2018-03-26 MED ORDER — RAMIPRIL 10 MG PO CAPS: 10.0000 mg | ORAL_CAPSULE | Freq: Every day | ORAL | 3 refills | Status: DC

## 2018-03-26 MED ORDER — HYDROCHLOROTHIAZIDE 25 MG PO TABS: 25.0000 mg | ORAL_TABLET | Freq: Every day | ORAL | 3 refills | Status: DC

## 2018-03-26 MED ORDER — AMLODIPINE-ATORVASTATIN 10-80 MG PO TABS: 1.0000 | ORAL_TABLET | Freq: Every evening | ORAL | 3 refills | Status: DC

## 2018-03-26 MED ORDER — METOPROLOL SUCCINATE ER 50 MG PO TB24: 50.0000 mg | ORAL_TABLET | Freq: Every day | ORAL | 3 refills | Status: DC

## 2018-03-26 MED ORDER — EZETIMIBE 10 MG PO TABS: 10.0000 mg | ORAL_TABLET | Freq: Every day | ORAL | 3 refills | Status: DC

## 2018-03-26 NOTE — Progress Notes (Signed)
Patient ID: Debbie [name], female   DOB: May 05, 1945, 71 y.o.   MRN: [id number]    PCP: Dr. [facility]  HPI: Debbie [name], is a 72 y.o. female who presents to the office today for an 13 month  followup evaluation.  Ms. Schexnayder has known CAD and in 2001 suffered an acute coronary syndrome and was found to have total occlusion of her RCA. She had 3 stents placed to her RCA. Subsequent catheterization in 2006 showed all 3 stents widely patent. Additionally, she has a history of hypertension as well as hyperlipidemia. She also has a history of atrial tachycardia. A nuclear perfusion study in September 2013 showed minimal anterior thinning not felt to be significant.  Because of mild possible claudication symptoms her lower extremity, she underwent a lower extremity arterial Doppler study which suggested mild arterial insufficiency with ABIs of 0.83 bilaterally.  When I  saw her in September 2014 laboratory suggested that she had evolved into type 2 diabetes mellitus. At that time her hemoglobin A1c was 7.0 and her fasting blood sugar was 128. She had normal renal function. She subsequently saw Dr. Lou Miner who did not initiate medical therapy but to pursue aggressive dietary adjustments initially.   She was evaluated in September 2017 by Kerin Ransom after experiencing left-sided chest pressure which was not exertional and oftentimes occurring at the night.  It did not radiate to her arms, jaw, or back.  She denied associated nausea, vomiting or diaphoresis.  She was referred for nuclear perfusion study which was done on 01/19/2016.  This was a normal study.  Ejection fraction was 58%.  She underwent blood work time and on her dose of Lipitor 80 mg and Zetia 10 mg total cholesterol was 150, triglycerides 110, HDL 64, and LDL 64. There was very minimal renal insufficiency with a creatinine of 1.14.  Her fasting glucose was 150.    I last saw her in November 2018 at which time she was remaining  stable and denied chest pain PND orthopnea presyncope or syncope.   Over the past year, she has continued to do well.  She continues to be on combination amlodipine/atorvastatin 10/80 in addition to Zetia 10 mg, HCTZ 25 mg, Toprol-XL 50 mg, and ramipril 10 mg daily for hypertension as well as hyperlipidemia.  She denies any recurrent anginal symptoms.  She denies any change in activity.  There is no exertional shortness of breath.  She has not had recent laboratory in over a year.  She presents for evaluation.    Past Medical History:  Diagnosis Date  . Arthritis   . CAD (coronary artery disease) Aug 2001   RCA stent X3, patent '06. Nuc low risk 9/13  . Cataract    beginning stages  . History of stress test 01/08/2012   Showed minimal anterior thinning not felt to be significant.  Marland Kitchen Hx of echocardiogram 12/12/2011   EF>55%  . Hypercholesteremia   . Hypertension   . Myocardial infarction ([place])    39 years ago    age 69  . PAF (paroxysmal atrial fibrillation) [facility]) Aug 2013   SSS component with some bradycardia  . Stroke ([place]) 1998  . Vitamin D deficiency     Past Surgical History:  Procedure Laterality Date  . CHOLECYSTECTOMY    . COLONOSCOPY    . CORONARY ANGIOGRAM  May 2006   patent stents  . CORONARY ANGIOPLASTY WITH STENT PLACEMENT  Aug 2001   X 3  No Known Allergies  Current Outpatient Medications  Medication Sig Dispense Refill  . amLODipine-atorvastatin (CADUET) 10-80 MG tablet Take 1 tablet by mouth every evening. 90 tablet 3  . aspirin EC 81 MG tablet Take 1 tablet (81 mg total) by mouth daily. 90 tablet 3  . ezetimibe (ZETIA) 10 MG tablet Take 1 tablet (10 mg total) by mouth daily. 90 tablet 3  . hydrochlorothiazide (HYDRODIURIL) 25 MG tablet Take 1 tablet (25 mg total) by mouth daily. 90 tablet 3  . metoprolol succinate (TOPROL-XL) 50 MG 24 hr tablet Take 1 tablet (50 mg total) by mouth daily. Take with or immediately following a meal. 90 tablet 3  . Multiple  Vitamins-Minerals (CENTRUM SILVER ADULT 50+) TABS Take 1 tablet by mouth daily.    . nitroGLYCERIN (NITROSTAT) 0.4 MG SL tablet Place 1 tablet (0.4 mg total) under the tongue every 5 (five) minutes as needed for chest pain. 25 tablet 3  . ramipril (ALTACE) 10 MG capsule Take 1 capsule (10 mg total) by mouth daily. 90 capsule 3   Current Facility-Administered Medications  Medication Dose Route Frequency Provider Last Rate Last Dose  . 0.9 %  sodium chloride infusion  500 mL Intravenous Once Milus Banister, MD        Socially she denies tobacco or alcohol. She does not routinely exercise.   ROS General: Negative; No fevers, chills, or night sweats;  HEENT: Negative; No changes in vision or hearing, sinus congestion, difficulty swallowing Pulmonary: Negative; No cough, wheezing, shortness of breath, hemoptysis Cardiovascular: Negative; No chest pain, presyncope, syncope, palpitations GI: Negative; No nausea, vomiting, diarrhea, or abdominal pain GU: Negative; No dysuria, hematuria, or difficulty voiding Musculoskeletal: Negative; no myalgias, joint pain, or weakness Hematologic/Oncology: Negative; no easy bruising, bleeding Endocrine: Negative; no heat/cold intolerance; no diabetes Neuro: Negative; no changes in balance, headaches Skin: Negative; No rashes or skin lesions Psychiatric: Negative; No behavioral problems, depression Sleep: Negative; No snoring, daytime sleepiness, hypersomnolence, bruxism, restless legs, hypnogognic hallucinations, no cataplexy Other comprehensive 14 point system review is negative.   PE BP 126/70   Pulse (!) 59   Ht _0  (1.651 m)   Wt 202 lb 6.4 oz (91.8 kg)   BMI 33.68 kg/m    Repeat blood pressure by me was 124/70.  Wt Readings from Last 3 Encounters:  03/26/18 202 lb 6.4 oz (91.8 kg)  11/06/17 199 lb 3.2 oz (90.4 kg)  07/16/17 195 lb (88.5 kg)   General: Alert, oriented, no distress.  Skin: normal turgor, no rashes, warm and dry HEENT:  Normocephalic, atraumatic. Pupils equal round and reactive to light; sclera anicteric; extraocular muscles intact;  Nose without nasal septal hypertrophy Mouth/Parynx benign; Mallinpatti scale 3 Neck: No JVD, no carotid bruits; normal carotid upstroke Lungs: clear to ausculatation and percussion; no wheezing or rales Chest wall: without tenderness to palpitation Heart: PMI not displaced, RRR, s1 s2 normal, 1/6 systolic murmur, no diastolic murmur, no rubs, gallops, thrills, or heaves Abdomen: Mild central adiposity, soft, nontender; no hepatosplenomehaly, BS+; abdominal aorta nontender and not dilated by palpation. Back: no CVA tenderness Pulses 2+ Musculoskeletal: full range of motion, normal strength, no joint deformities Extremities: no clubbing cyanosis or edema, Homan's sign negative  Neurologic: grossly nonfocal; Cranial nerves grossly wnl Psychologic: Normal mood and affect   ECG (independently read by me): Sinus bradycardia 59 bpm.  PAC.  November 2018 ECG (independently read by me): Sinus bradycardia 53 with sinus arrhythmia.  Small inferior Q waves.  November 2017  ECG (independently read by me): Sinus bradycardia at 53 with mild sinus arrhythmia.  Nonspecific T changes.  September 2016 ECG (independently read by me): Sinus bradycardia 55 bpm.  No significant ST-T changes.  March 2015 ECG (independentlyby me): Sinus bradycardia 55 beats per minute. No ectopy.  Prior September,16 2014 ECG: Sinus bradycardia 48 beats per minute. Intervals normal  LABS: BMP Latest Ref Rng & Units 11/06/2017 01/18/2016 01/09/2015  Glucose 65 - 99 mg/dL 144(H) 150(H) 149(H)  BUN 8 - 27 mg/dL 19 29(H) 19  Creatinine 0.57 - 1.00 mg/dL 1.14(H) 1.14(H) 1.04(H)  BUN/Creat Ratio 12 - 28 17 - -  Sodium 134 - 144 mmol/L 141 139 138  Potassium 3.5 - 5.2 mmol/L 4.2 4.5 4.4  Chloride 96 - 106 mmol/L 106 108 104  CO2 20 - 29 mmol/L _0 Calcium 8.7 - 10.3 mg/dL 10.1 9.9 10.3   Hepatic Function  Latest Ref Rng & Units 01/18/2016 01/09/2015 09/29/2012  Total Protein 6.1 - 8.1 g/dL 6.6 6.9 7.2  Albumin 3.6 - 5.1 g/dL 4.1 4.1 4.3  AST 10 - 35 U/L _1 ALT 6 - 29 U/L _2 Alk Phosphatase 33 - 130 U/L 72 77 81  Total Bilirubin 0.2 - 1.2 mg/dL 0.4 0.6 0.5   CBC Latest Ref Rng & Units 01/09/2015 09/29/2012 12/14/2011  WBC 4.0 - 10.5 K/uL 7.9 8.3 8.0  Hemoglobin 12.0 - 15.0 g/dL 14.0 13.7 11.8(L)  Hematocrit 36.0 - 46.0 % 40.5 40.6 34.8(L)  Platelets 150 - 400 K/uL 353 360 262   Lab Results  Component Value Date   MCV 89.0 01/09/2015   MCV 89.8 09/29/2012   MCV 89.7 12/14/2011   Lab Results  Component Value Date   TSH 1.934 01/09/2015   Lab Results  Component Value Date   HGBA1C 7.1 (H) 01/09/2015   Lipid Panel     Component Value Date/Time   CHOL 150 01/18/2016 0823   TRIG 110 01/18/2016 0823   HDL 64 01/18/2016 0823   CHOLHDL 2.3 01/18/2016 0823   VLDL 22 01/18/2016 0823   LDLCALC 64 01/18/2016 0823   RADIOLOGY: No results found.  IMPRESSION: 1. CAD S/P percutaneous coronary angioplasty   2. Essential hypertension   3. Hyperlipidemia with target LDL less than 70   4. Sinus arrhythmia   5. Mild obesity     ASSESSMENT AND PLAN: Ms. Larranaga is a 72 year old female who is almost 19 years following her initial multi lesion intervention to her RCA in the setting of an ACS at which time she was found to have total RCA occlusion.  3 stents were placed and on follow-up evaluation in 2006 all stents were widely patent.  Over the last decade, she has been treated very aggressively with lipids.  In 2017 her LDL cholesterol was 64 which was her last assessment.  She has continued to be on atorvastatin 80 mg and Zetia 10 mg.  Her blood pressure today is controlled on amlodipine, HCTZ, and ramipril.  There is no leg edema.  She denies any recurrent anginal symptoms.  She denies palpitations.  She denies presyncope or syncope.  She is fasting today and I will obtain a complete  set of fasting laboratory.  She is mildly obese with a BMI of 33.68.  It is recommended that she at least do activity 5 days/week for minimum of 30 minutes of moderate intensity per AHA guidelines.  I will contact her regarding her laboratory.  Adjustments  to her medical regimen will be made if necessary.  Her ECG shows sinus rhythm with sinus arrhythmia which may be respiratory mediated.  As long as she remains stable I will see her in 1 year for reevaluation.   Time spent: 25 minutes  Troy Sine, MD, [facility]  03/26/2018 10:31 AM

## 2018-03-26 NOTE — Patient Instructions (Signed)
Medication Instructions:  Your physician recommends that you continue on your current medications as directed. Please refer to the Current Medication list given to you today.  If you need a refill on your cardiac medications before your next appointment, please call your pharmacy.   Lab work: Today (CMET, CBC, Lipid, TSH) If you have labs (blood work) drawn today and your tests are completely normal, you will receive your results only by: Marland Kitchen MyChart Message (if you have MyChart) OR . A paper copy in the mail If you have any lab test that is abnormal or we need to change your treatment, we will call you to review the results.  Follow-Up: At [facility], you and your health needs are our priority.  As part of our continuing mission to provide you with exceptional heart care, we have created designated Provider Care Teams.  These Care Teams include your primary Cardiologist (physician) and Advanced Practice Providers (APPs -  Physician Assistants and Nurse Practitioners) who all work together to provide you with the care you need, when you need it. You will need a follow up appointment in 12 months.  Please call our office 2 months in advance to schedule this appointment.  You may see Dr. Claiborne Billings or one of the following Advanced Practice Providers on your designated Care Team: [place], [place] . Fabian Sharp, PA-C

## 2018-03-27 LAB — COMPREHENSIVE METABOLIC PANEL
ALT: 20 IU/L (ref 0–32)
AST: 14 IU/L (ref 0–40)
Albumin/Globulin Ratio: 1.7 (ref 1.2–2.2)
Albumin: 4.3 g/dL (ref 3.5–4.8)
Alkaline Phosphatase: 92 IU/L (ref 39–117)
BUN/Creatinine Ratio: 15 (ref 12–28)
BUN: 14 mg/dL (ref 8–27)
Bilirubin Total: 0.7 mg/dL (ref 0.0–1.2)
CALCIUM: 10.1 mg/dL (ref 8.7–10.3)
CO2: 22 mmol/L (ref 20–29)
Chloride: 105 mmol/L (ref 96–106)
Creatinine, Ser: 0.95 mg/dL (ref 0.57–1.00)
GFR calc Af Amer: 69 mL/min/{1.73_m2} (ref >59)
GFR, EST NON AFRICAN AMERICAN: 60 mL/min/{1.73_m2} (ref >59)
Globulin, Total: 2.5 g/dL (ref 1.5–4.5)
Glucose: 140 mg/dL — ABNORMAL HIGH (ref 65–99)
POTASSIUM: 4.1 mmol/L (ref 3.5–5.2)
Sodium: 143 mmol/L (ref 134–144)
Total Protein: 6.8 g/dL (ref 6.0–8.5)

## 2018-03-27 LAB — LIPID PANEL
CHOLESTEROL TOTAL: 154 mg/dL (ref 100–199)
Chol/HDL Ratio: 2.5 ratio (ref 0.0–4.4)
HDL: 62 mg/dL (ref >39)
LDL CALC: 71 mg/dL (ref 0–99)
Triglycerides: 103 mg/dL (ref 0–149)
VLDL CHOLESTEROL CAL: 21 mg/dL (ref 5–40)

## 2018-03-27 LAB — CBC
HEMOGLOBIN: 12.4 g/dL (ref 11.1–15.9)
Hematocrit: 36.8 % (ref 34.0–46.6)
MCH: 29.7 pg (ref 26.6–33.0)
MCHC: 33.7 g/dL (ref 31.5–35.7)
MCV: 88 fL (ref 79–97)
Platelets: 327 10*3/uL (ref 150–450)
RBC: 4.17 x10E6/uL (ref 3.77–5.28)
RDW: 13 % (ref 12.3–15.4)
WBC: 8.2 10*3/uL (ref 3.4–10.8)

## 2018-03-27 LAB — TSH: TSH: 2.08 u[IU]/mL (ref 0.450–4.500)

## 2018-04-07 ENCOUNTER — Encounter: Payer: Self-pay | Admitting: *Deleted

## 2018-05-08 ENCOUNTER — Other Ambulatory Visit: Payer: Self-pay

## 2018-05-08 ENCOUNTER — Emergency Department (HOSPITAL_COMMUNITY): Payer: Medicare Other

## 2018-05-08 ENCOUNTER — Emergency Department (HOSPITAL_COMMUNITY)
Admission: EM | Admit: 2018-05-08 | Discharge: 2018-05-08 | Disposition: A | Discharge status: Home / Self Care | Payer: Medicare Other | Attending: Emergency Medicine | Admitting: Emergency Medicine

## 2018-05-08 ENCOUNTER — Encounter (HOSPITAL_COMMUNITY): Payer: Self-pay

## 2018-05-08 DIAGNOSIS — Z79899 Other long term (current) drug therapy: Secondary | ICD-10-CM | POA: Diagnosis not present

## 2018-05-08 DIAGNOSIS — Y929 Unspecified place or not applicable: Secondary | ICD-10-CM | POA: Diagnosis not present

## 2018-05-08 DIAGNOSIS — I480 Paroxysmal atrial fibrillation: Secondary | ICD-10-CM | POA: Insufficient documentation

## 2018-05-08 DIAGNOSIS — F17210 Nicotine dependence, cigarettes, uncomplicated: Secondary | ICD-10-CM | POA: Insufficient documentation

## 2018-05-08 DIAGNOSIS — Z7982 Long term (current) use of aspirin: Secondary | ICD-10-CM | POA: Diagnosis not present

## 2018-05-08 DIAGNOSIS — Y998 Other external cause status: Secondary | ICD-10-CM | POA: Insufficient documentation

## 2018-05-08 DIAGNOSIS — I10 Essential (primary) hypertension: Secondary | ICD-10-CM | POA: Insufficient documentation

## 2018-05-08 DIAGNOSIS — W010XXA Fall on same level from slipping, tripping and stumbling without subsequent striking against object, initial encounter: Secondary | ICD-10-CM | POA: Insufficient documentation

## 2018-05-08 DIAGNOSIS — I2510 Atherosclerotic heart disease of native coronary artery without angina pectoris: Secondary | ICD-10-CM | POA: Insufficient documentation

## 2018-05-08 DIAGNOSIS — S0101XA Laceration without foreign body of scalp, initial encounter: Secondary | ICD-10-CM | POA: Insufficient documentation

## 2018-05-08 DIAGNOSIS — Z8673 Personal history of transient ischemic attack (TIA), and cerebral infarction without residual deficits: Secondary | ICD-10-CM | POA: Diagnosis not present

## 2018-05-08 DIAGNOSIS — Y9389 Activity, other specified: Secondary | ICD-10-CM | POA: Diagnosis not present

## 2018-05-08 DIAGNOSIS — E7800 Pure hypercholesterolemia, unspecified: Secondary | ICD-10-CM | POA: Diagnosis not present

## 2018-05-08 IMAGING — CT CT CERVICAL SPINE W/O CM
4 of 8 series · 12 of 33 positions shown, 13 images · non-contrast
Comparison: Brain MRI [DATE].

CLINICAL DATA: Status post fall today with a blow to the back of
the head. Initial encounter.

EXAM:
CT HEAD WITHOUT CONTRAST
CT CERVICAL SPINE WITHOUT CONTRAST
TECHNIQUE: Multidetector CT imaging of the head and cervical spine was
performed following the standard protocol without intravenous
contrast. Multiplanar CT image reconstructions of the cervical spine
were also generated.

[Series 9: c_spine 2.0 st · axial · 0.34mm/px · z∈[-302,-238]mm · 3 of 65 slices shown]
[im 17/65  bone]
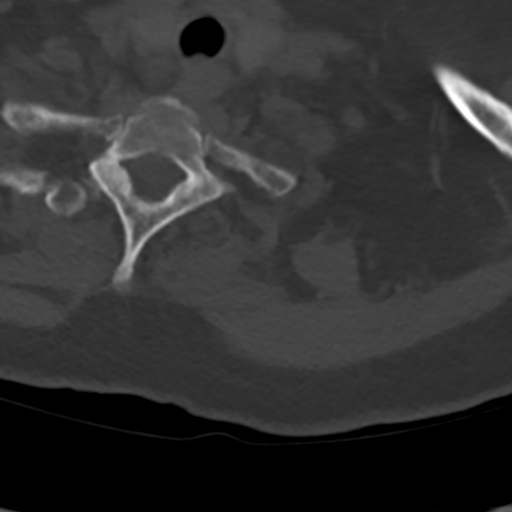
[im 33/65  bone]
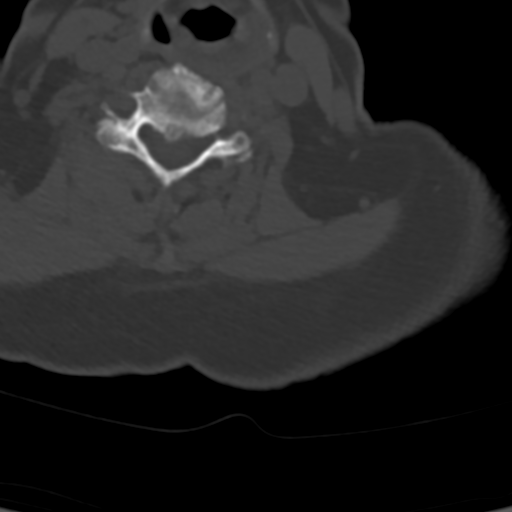
[im 49/65  bone]
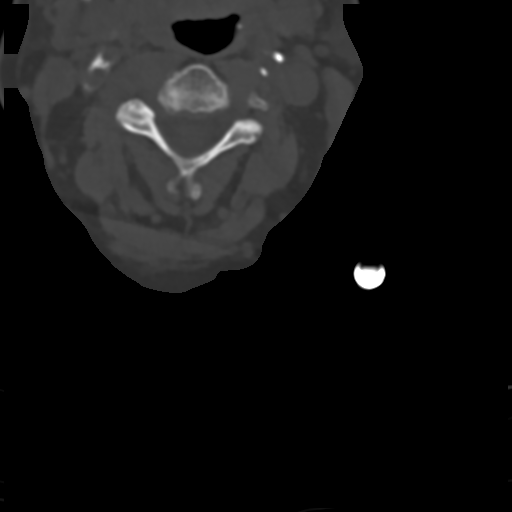

[Series 10: coronal bone · coronal · 0.23mm/px · 1 of 61 slices shown (1 of 2)]
[im 31/61  bone]
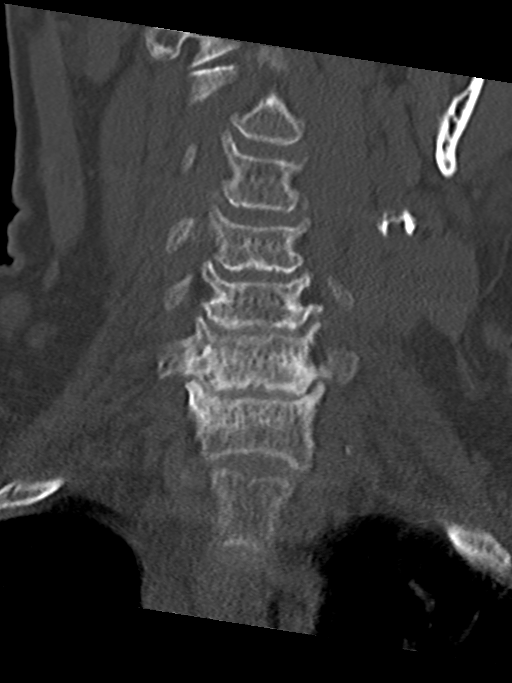

[Series 13: orthogonal axial st · axial · 0.21mm/px · z∈[-309,-234]mm · 4 of 74 slices shown, 5 images]
[im 15/74  soft-tissue]
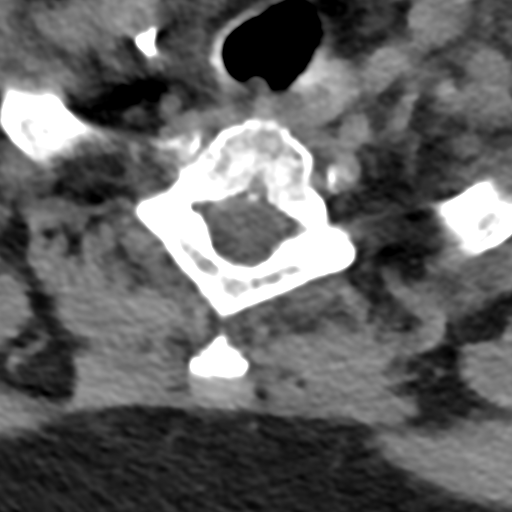
[im 15/74  bone]
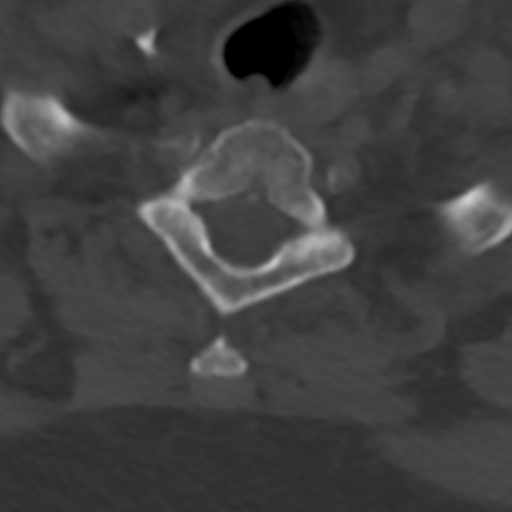
[im 30/74  bone]
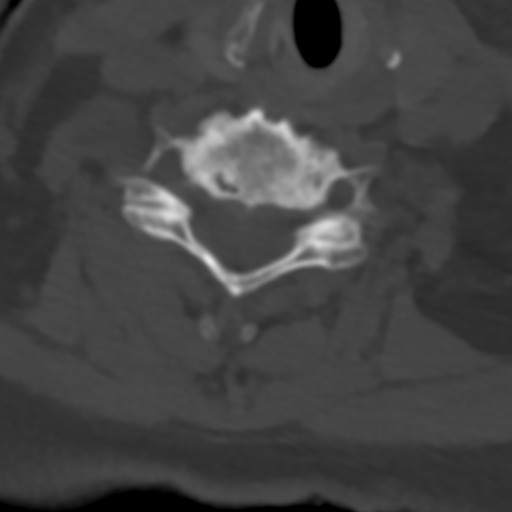
[im 44/74  bone]
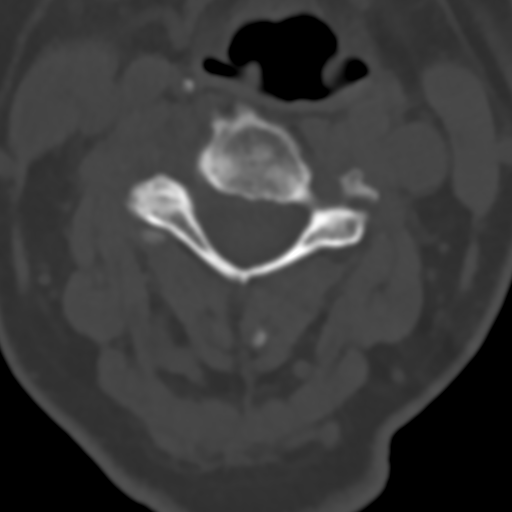
[im 59/74  bone]
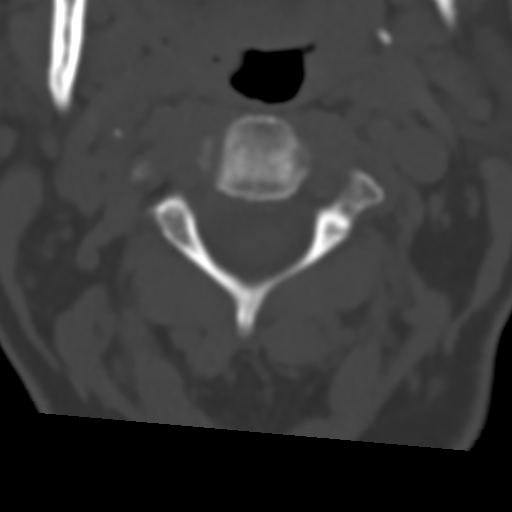

[Series 16: coronal bone · sagittal · 0.25mm/px · 4 of 86 slices shown (2 of 2)]
[im 18/86  bone]
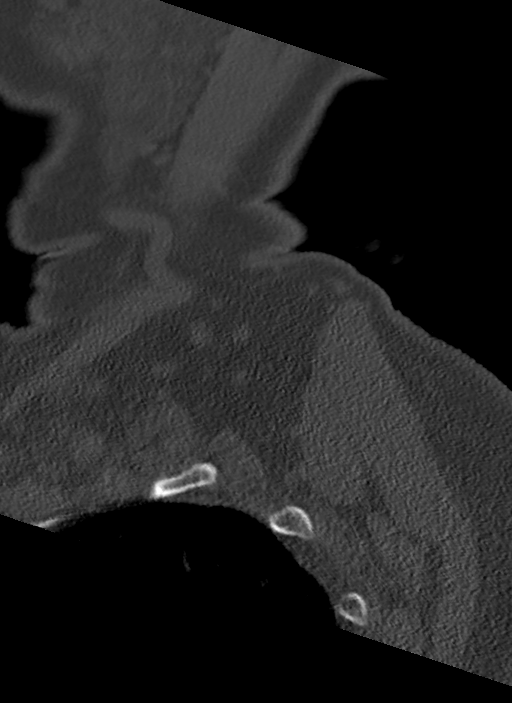
[im 35/86  bone]
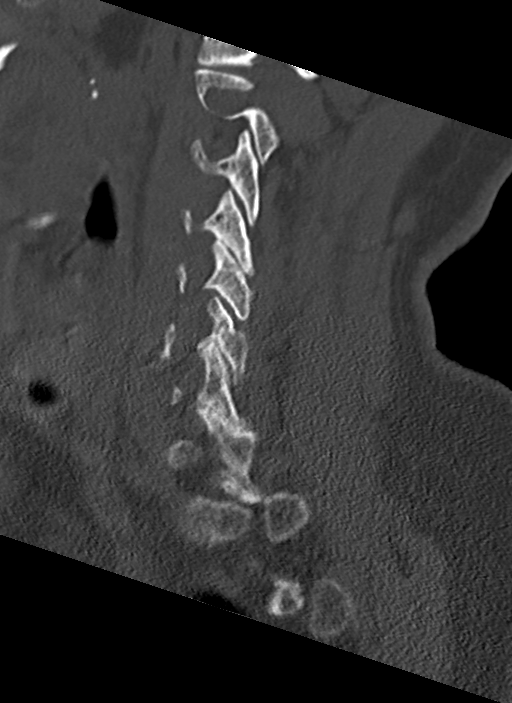
[im 52/86  bone]
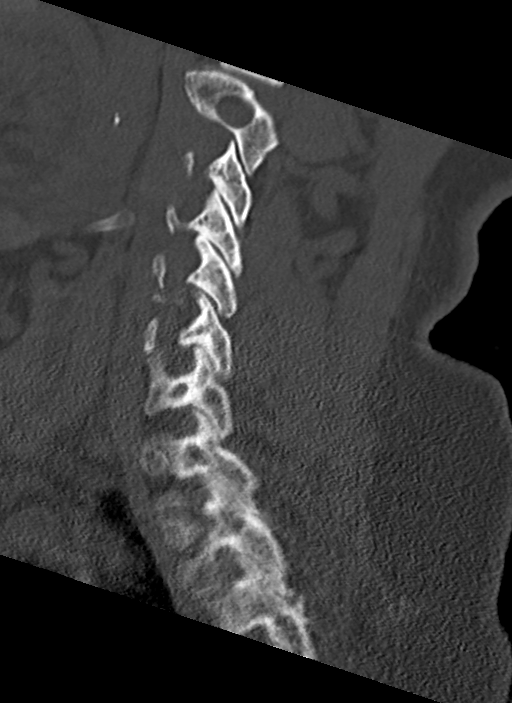
[im 69/86  bone]
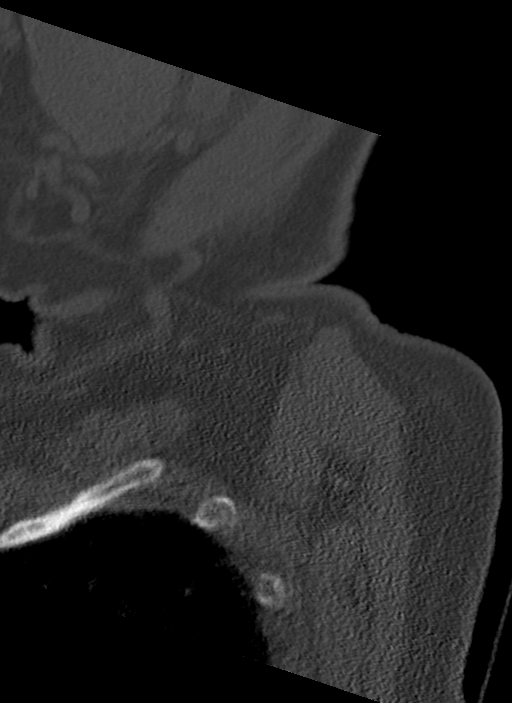

[12 of 33 positions shown; findings below may reference images not displayed]

FINDINGS: CT HEAD FINDINGS

Brain: No evidence of acute infarction, hemorrhage, hydrocephalus,
extra-axial collection or mass lesion/mass effect. Atrophy, chronic
microvascular ischemic change and remote left frontal and occipital
lobe infarcts are noted.

Vascular: No hyperdense vessel or unexpected calcification.

Skull: Intact.  No focal lesion.

Sinuses/Orbits: Status post cataract surgery. Otherwise
unremarkable.

Other: None.

CT CERVICAL SPINE FINDINGS

Alignment: Maintained.

Skull base and vertebrae: No acute fracture. No primary bone lesion
or focal pathologic process.

Soft tissues and spinal canal: No prevertebral fluid or swelling. No
visible canal hematoma.

Disc levels: Multilevel loss of disc space height and endplate
spurring appear worst at C5-6 and C6-7.

Upper chest: Lung apices clear.

Other: None.
IMPRESSION: No acute abnormality head or cervical spine.

Atrophy, microvascular ischemic change left frontal and occipital
lobe infarcts.

Cervical spondylosis worst at C5-6 and C6-7.

## 2018-05-08 MED ORDER — TETANUS-DIPHTH-ACELL PERTUSSIS 5-2.5-18.5 LF-MCG/0.5 IM SUSP
0.5000 mL | Freq: Once | INTRAMUSCULAR | Status: AC
  Administered 2018-05-08: 0.5 mL via INTRAMUSCULAR
  Filled 2018-05-08: qty 0.5

## 2018-05-08 MED ORDER — LIDOCAINE-EPINEPHRINE-TETRACAINE (LET) SOLUTION
3.0000 mL | Freq: Once | NASAL | Status: AC
  Administered 2018-05-08: 3 mL via TOPICAL
  Filled 2018-05-08: qty 3

## 2018-05-08 MED ORDER — LIDOCAINE-EPINEPHRINE (PF) 2 %-1:200000 IJ SOLN
10.0000 mL | Freq: Once | INTRAMUSCULAR | Status: AC
  Administered 2018-05-08: 10 mL
  Filled 2018-05-08: qty 20

## 2018-05-08 NOTE — ED Notes (Signed)
Patient verbalizes understanding of discharge instructions. Opportunity for questioning and answers were provided. Armband removed by staff, pt discharged from ED ambulatory with husband.  

## 2018-05-08 NOTE — ED Provider Notes (Signed)
[facility] EMERGENCY DEPARTMENT Provider Note   CSN: [id number] Arrival date & time: 05/08/18  0946     History   Chief Complaint Chief Complaint  Patient presents with  . Fall    HPI Debbie [name] is a 73 y.o. 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  Patient reports she was on the phone and distracted and thought she was sitting on a chair when she was not and fell and hit her head on concrete.  She did not lose consciousness.  She denies any other injuries.  She denies any neck or back pain.  She also denies any vomiting or nausea.  No medications taken prior to arrival.  She is not on anticoagulation except for baby aspirin daily.  Tetanus is not up-to-date.  HPI  Past Medical History:  Diagnosis Date  . Arthritis   . CAD (coronary artery disease) Aug 2001   RCA stent X3, patent '06. Nuc low risk 9/13  . Cataract    beginning stages  . History of stress test 01/08/2012   Showed minimal anterior thinning not felt to be significant.  Marland Kitchen Hx of echocardiogram 12/12/2011   EF>55%  . Hypercholesteremia   . Hypertension   . Myocardial infarction ([place])    39 years ago    age 89  . PAF (paroxysmal atrial fibrillation) [facility]) Aug 2013   SSS component with some bradycardia  . Stroke ([place]) 1998  . Vitamin D deficiency     Patient Active Problem List   Diagnosis Date Noted  . Bilateral occipital neuralgia 11/06/2017  . CAD in native artery 03/02/2016  . Chest pain with moderate risk for cardiac etiology 01/10/2016  . DM2 (diabetes mellitus, type 2) ([place]) 12/29/2012  . CAD S/P percutaneous coronary angioplasty 09/22/2012  . History of CVA, 1998  09/22/2012  . Bradycardia Aug 2013 12/14/2011  . PAF (paroxysmal atrial fibrillation) ([place]) 12/14/2011  . Essential hypertension 12/14/2011  .  Hyperlipidemia 12/14/2011    Past Surgical History:  Procedure Laterality Date  . CHOLECYSTECTOMY    . COLONOSCOPY    . CORONARY ANGIOGRAM  May 2006   patent stents  . CORONARY ANGIOPLASTY WITH STENT PLACEMENT  Aug 2001   X 3     OB History   No obstetric history on file.      Home Medications    Prior to Admission medications   Medication Sig Start Date End Date Taking? Authorizing Provider  amLODipine-atorvastatin (CADUET) 10-80 MG tablet Take 1 tablet by mouth every evening. 03/26/18  Yes Troy Sine, MD  aspirin EC 81 MG tablet Take 1 tablet (81 mg total) by mouth daily. 03/01/16  Yes Troy Sine, MD  ezetimibe (ZETIA) 10 MG tablet Take 1 tablet (10 mg total) by mouth daily. 03/26/18  Yes Troy Sine, MD  hydrochlorothiazide (HYDRODIURIL) 25 MG tablet Take 1 tablet (25 mg total) by mouth daily. 03/26/18  Yes Troy Sine, MD  metoprolol succinate (TOPROL-XL) 50 MG 24 hr tablet Take 1 tablet (50 mg total) by mouth daily. Take with or immediately following a meal. 03/26/18  Yes Troy Sine, MD  Multiple Vitamins-Minerals (CENTRUM SILVER ADULT 50+) TABS Take 1 tablet by mouth daily.   Yes [provider]  ramipril (ALTACE) 10 MG capsule Take 1 capsule (10 mg total) by mouth daily. 03/26/18  Yes Claiborne Billings,  Joyice Faster, MD  nitroGLYCERIN (NITROSTAT) 0.4 MG SL tablet Place 1 tablet (0.4 mg total) under the tongue every 5 (five) minutes as needed for chest pain. Patient not taking: Reported on 05/08/2018 01/10/16 05/08/18  Erlene Quan, PA-C    Family History Family History  Problem Relation Age of Onset  . Diabetes type II Sister   . Heart disease Sister   . Colon cancer Neg Hx   . Colon polyps Neg Hx   . Esophageal cancer Neg Hx   . Rectal cancer Neg Hx   . Stomach cancer Neg Hx     Social History Social History   Tobacco Use  . Smoking status: Current Some Day Smoker    Years: 60.00    Types: Cigarettes  . Smokeless tobacco: Never Used  .  Tobacco comment: 2-3 cigarettes per day  Substance Use Topics  . Alcohol use: Not Currently  . Drug use: No     Allergies   Patient has no known allergies.   Review of Systems Review of Systems  Constitutional: Negative for chills and fever.  HENT: Negative for facial swelling and sore throat.   Respiratory: Negative for shortness of breath.   Cardiovascular: Negative for chest pain.  Gastrointestinal: Negative for abdominal pain, nausea and vomiting.  Genitourinary: Negative for dysuria.  Musculoskeletal: Negative for back pain and neck pain.  Skin: Positive for wound. Negative for rash.  Neurological: Negative for headaches.  Psychiatric/Behavioral: The patient is not nervous/anxious.      Physical Exam Updated Vital Signs BP (!) 117/51   Pulse (!) 57   Resp 19   Ht 5\' 5"  (1.651 m)   Wt 91.6 kg   SpO2 96%   BMI 33.61 kg/m   Physical Exam Vitals signs and nursing note reviewed.  Constitutional:      General: She is not in acute distress.    Appearance: She is well-developed. She is not diaphoretic.  HENT:     Head: Normocephalic and atraumatic.      Mouth/Throat:     Pharynx: No oropharyngeal exudate.  Eyes:     General: No scleral icterus.       Right eye: No discharge.        Left eye: No discharge.     Conjunctiva/sclera: Conjunctivae normal.     Pupils: Pupils are equal, round, and reactive to light.  Neck:     Musculoskeletal: Normal range of motion and neck supple.     Thyroid: No thyromegaly.  Cardiovascular:     Rate and Rhythm: Normal rate and regular rhythm.     Heart sounds: Normal heart sounds. No murmur. No friction rub. No gallop.   Pulmonary:     Effort: Pulmonary effort is normal. No respiratory distress.     Breath sounds: Normal breath sounds. No stridor. No wheezing or rales.  Abdominal:     General: Bowel sounds are normal. There is no distension.     Palpations: Abdomen is soft.     Tenderness: There is no abdominal tenderness.  There is no guarding or rebound.  Musculoskeletal:     Comments: No midline cervical, thoracic, or lumbar tenderness  Lymphadenopathy:     Cervical: No cervical adenopathy.  Skin:    General: Skin is warm and dry.     Coloration: Skin is not pale.     Findings: No rash.  Neurological:     Mental Status: She is alert.     Coordination: Coordination normal.  ED Treatments / Results  Labs (all labs ordered are listed, but only abnormal results are displayed) Labs Reviewed - No data to display  EKG None  Radiology Ct Head Wo Contrast  Result Date: 05/08/2018 CLINICAL DATA:  Status post fall today with a blow to the back of the head. Initial encounter. EXAM: CT HEAD WITHOUT CONTRAST CT CERVICAL SPINE WITHOUT CONTRAST TECHNIQUE: Multidetector CT imaging of the head and cervical spine was performed following the standard protocol without intravenous contrast. Multiplanar CT image reconstructions of the cervical spine were also generated. COMPARISON:  Brain MRI 11/25/2017. FINDINGS: CT HEAD FINDINGS Brain: No evidence of acute infarction, hemorrhage, hydrocephalus, extra-axial collection or mass lesion/mass effect. Atrophy, chronic microvascular ischemic change and remote left frontal and occipital lobe infarcts are noted. Vascular: No hyperdense vessel or unexpected calcification. Skull: Intact.  No focal lesion. Sinuses/Orbits: Status post cataract surgery. Otherwise unremarkable. Other: None. CT CERVICAL SPINE FINDINGS Alignment: Maintained. Skull base and vertebrae: No acute fracture. No primary bone lesion or focal pathologic process. Soft tissues and spinal canal: No prevertebral fluid or swelling. No visible canal hematoma. Disc levels: Multilevel loss of disc space height and endplate spurring appear worst at C5-6 and C6-7. Upper chest: Lung apices clear. Other: None. IMPRESSION: No acute abnormality head or cervical spine. Atrophy, microvascular ischemic change left frontal and  occipital lobe infarcts. Cervical spondylosis worst at C5-6 and C6-7. Electronically Signed   By: Inge Rise M.D.   On: 05/08/2018 11:23   Ct Cervical Spine Wo Contrast  Result Date: 05/08/2018 CLINICAL DATA:  Status post fall today with a blow to the back of the head. Initial encounter. EXAM: CT HEAD WITHOUT CONTRAST CT CERVICAL SPINE WITHOUT CONTRAST TECHNIQUE: Multidetector CT imaging of the head and cervical spine was performed following the standard protocol without intravenous contrast. Multiplanar CT image reconstructions of the cervical spine were also generated. COMPARISON:  Brain MRI 11/25/2017. FINDINGS: CT HEAD FINDINGS Brain: No evidence of acute infarction, hemorrhage, hydrocephalus, extra-axial collection or mass lesion/mass effect. Atrophy, chronic microvascular ischemic change and remote left frontal and occipital lobe infarcts are noted. Vascular: No hyperdense vessel or unexpected calcification. Skull: Intact.  No focal lesion. Sinuses/Orbits: Status post cataract surgery. Otherwise unremarkable. Other: None. CT CERVICAL SPINE FINDINGS Alignment: Maintained. Skull base and vertebrae: No acute fracture. No primary bone lesion or focal pathologic process. Soft tissues and spinal canal: No prevertebral fluid or swelling. No visible canal hematoma. Disc levels: Multilevel loss of disc space height and endplate spurring appear worst at C5-6 and C6-7. Upper chest: Lung apices clear. Other: None. IMPRESSION: No acute abnormality head or cervical spine. Atrophy, microvascular ischemic change left frontal and occipital lobe infarcts. Cervical spondylosis worst at C5-6 and C6-7. Electronically Signed   By: Inge Rise M.D.   On: 05/08/2018 11:23    Procedures .Marland KitchenLaceration Repair Date/Time: 05/08/2018 2:02 PM Performed by: Frederica Kuster, PA-C Authorized by: Frederica Kuster, PA-C   Consent:    Consent obtained:  Verbal   Consent given by:  Patient   Risks discussed:  Infection,  pain and poor cosmetic result   Alternatives discussed:  No treatment Anesthesia (see MAR for exact dosages):    Anesthesia method:  Local infiltration and topical application   Topical anesthetic:  LET   Local anesthetic:  Lidocaine 2% WITH epi Laceration details:    Location:  Scalp   Scalp location:  Occipital   Length (cm):  5 Repair type:    Repair type:  Simple  Pre-procedure details:    Preparation:  Patient was prepped and draped in usual sterile fashion and imaging obtained to evaluate for foreign bodies Exploration:    Hemostasis achieved with:  Direct pressure   Wound exploration: wound explored through full range of motion and entire depth of wound probed and visualized     Wound extent: no foreign bodies/material noted and no muscle damage noted     Contaminated: no   Treatment:    Area cleansed with:  Saline   Amount of cleaning:  Standard   Irrigation solution:  Sterile saline   Irrigation volume:  146mL   Irrigation method:  Syringe   Visualized foreign bodies/material removed: no   Skin repair:    Repair method:  Staples   Number of staples:  6 Approximation:    Approximation:  Close Post-procedure details:    Dressing:  Antibiotic ointment and non-adherent dressing   Patient tolerance of procedure:  Tolerated well, no immediate complications   (including critical care time)  Medications Ordered in ED Medications  lidocaine-EPINEPHrine-tetracaine (LET) solution (3 mLs Topical Given 05/08/18 1018)  Tdap (BOOSTRIX) injection 0.5 mL (0.5 mLs Intramuscular Given 05/08/18 1018)  lidocaine-EPINEPHrine (XYLOCAINE W/EPI) 2 %-1:200000 (PF) injection 10 mL (10 mLs Infiltration Given by Other 05/08/18 1213)     Initial Impression / Assessment and Plan / ED Course  I have reviewed the triage vital signs and the nursing notes.  Pertinent labs & imaging results that were available during my care of the patient were reviewed by me and considered in my medical decision  making (see chart for details).     Patient presenting following mechanical fall with scalp laceration.  It was repaired with staples.  CT head and C-spine are negative for intracranial or other acute findings.  Tdap updated.  Wound care discussed.  Return precautions discussed and advised to return in 7 days for staple removal.  Patient understands and agrees with plan.  Patient vital stable throughout ED course and discharged in satisfactory condition.  Patient also evaluated by my attending, Dr. Maryan Rued, who guided the patient's management and agrees with plan.  Final Clinical Impressions(s) / ED Diagnoses   Final diagnoses:  Laceration of scalp, initial encounter    ED Discharge Orders    None       Frederica Kuster, PA-C 05/08/18 1407    Blanchie Dessert, MD 05/13/18 1720

## 2018-05-08 NOTE — ED Triage Notes (Addendum)
Pt arrives from home after "just falling" onto the cement; pt states she was on the phone with her sister, she thought she was sitting back onto a chair but the chair wasn't there when she sat back. Pt states she did not lose consciousness, but did hit the back of her head; bleeding controlled. Pt denies being on blood thinners. Alert, oriented, and ambulatory to ED. Pt placed in position of comfort with call bell in reach, bed locked and lowered.

## 2018-05-08 NOTE — Discharge Instructions (Addendum)
Keep wound dry for 24 hours.  After this, you can wash with warm soapy water.  Apply antibiotic ointment daily.  You will need the staples removed in 7 days.  Please follow-up with your doctor or return here at that time.  Please return sooner if you develop any increasing pain, redness, swelling, thick drainage, or any other concerning symptom.  You can use ice over the wound to help with swelling and pain.  You can take Tylenol as described over-the-counter, as needed for headache.

## 2018-11-03 ENCOUNTER — Other Ambulatory Visit: Payer: Self-pay

## 2018-11-03 MED ORDER — EZETIMIBE 10 MG PO TABS: 10.0000 mg | ORAL_TABLET | Freq: Every day | ORAL | 1 refills | Status: DC

## 2018-11-03 NOTE — Telephone Encounter (Signed)
Rx(s) sent to pharmacy electronically.  

## 2018-11-23 ENCOUNTER — Other Ambulatory Visit: Payer: Self-pay | Admitting: Internal Medicine

## 2018-11-23 DIAGNOSIS — Z1231 Encounter for screening mammogram for malignant neoplasm of breast: Secondary | ICD-10-CM

## 2019-01-04 ENCOUNTER — Ambulatory Visit
Admission: RE | Admit: 2019-01-04 | Discharge: 2019-01-04 | Disposition: A | Discharge status: Home / Self Care | Payer: Medicare Other | Source: Ambulatory Visit | Attending: Internal Medicine | Admitting: Internal Medicine

## 2019-01-04 ENCOUNTER — Other Ambulatory Visit: Payer: Self-pay

## 2019-01-04 DIAGNOSIS — Z1231 Encounter for screening mammogram for malignant neoplasm of breast: Secondary | ICD-10-CM

## 2019-01-04 IMAGING — MG MM DIGITAL SCREENING BILAT W/ TOMO W/ CAD
6 of 12 series · 6 of 36 positions shown · non-contrast
Comparison: Previous exam(s).

CLINICAL DATA: Screening.

EXAM:
DIGITAL SCREENING BILATERAL MAMMOGRAM WITH TOMO AND CAD

[L CC synth-2D (1 of 2)]
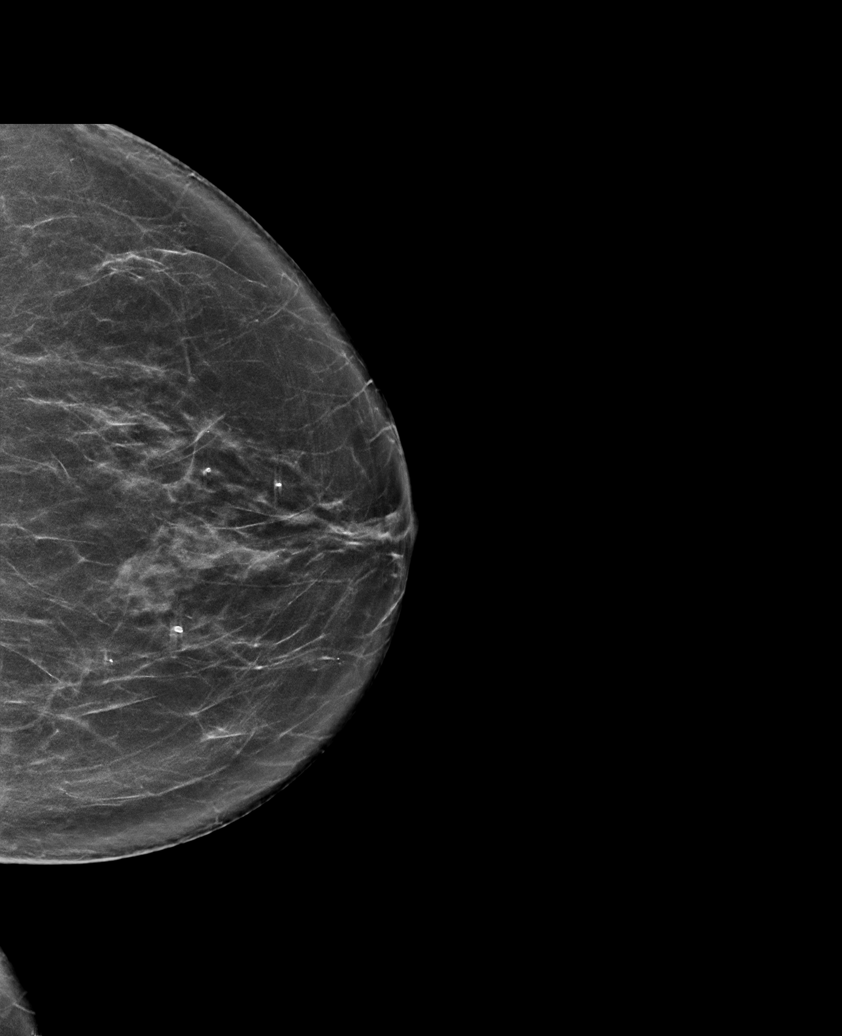

[R MLO synth-2D (1 of 2)]
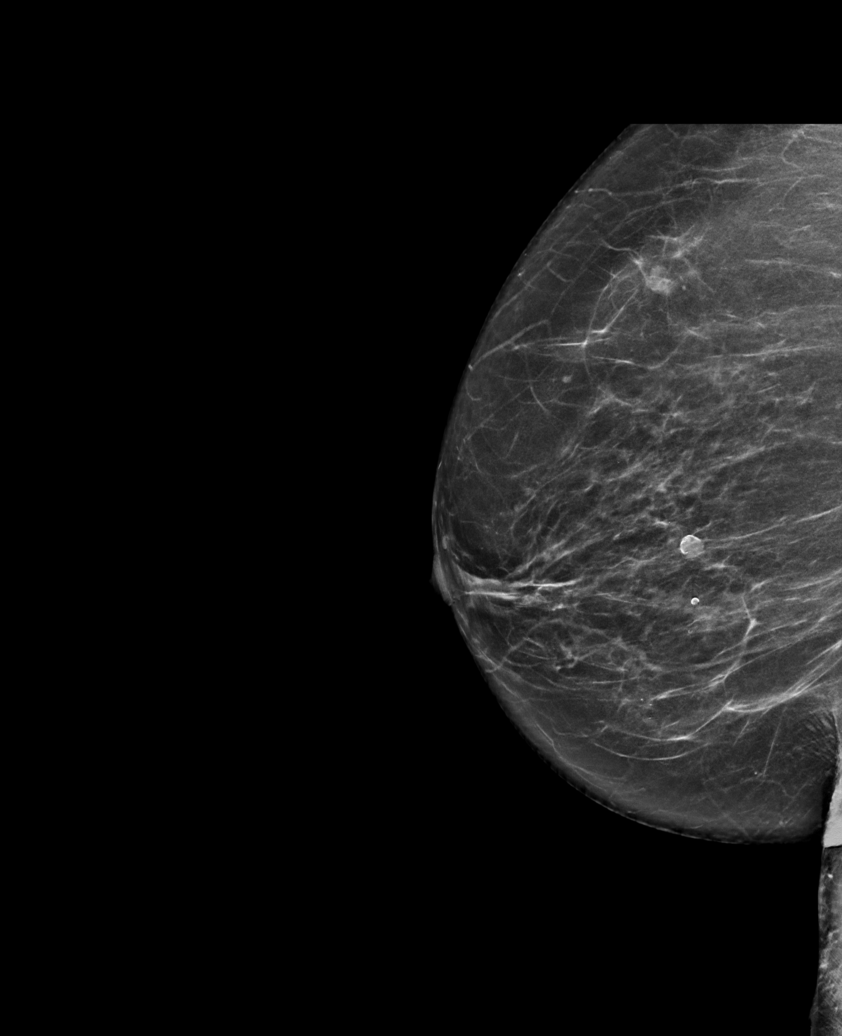

[L CC synth-2D (2 of 2)]
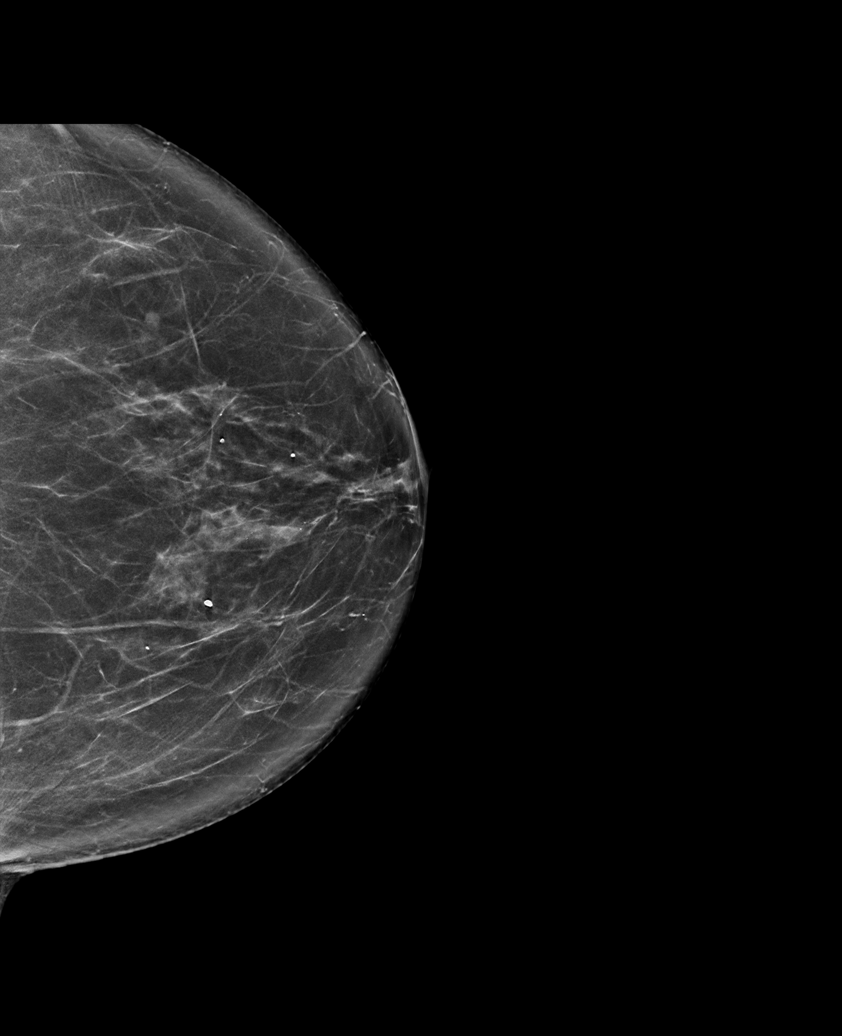

[L MLO synth-2D]
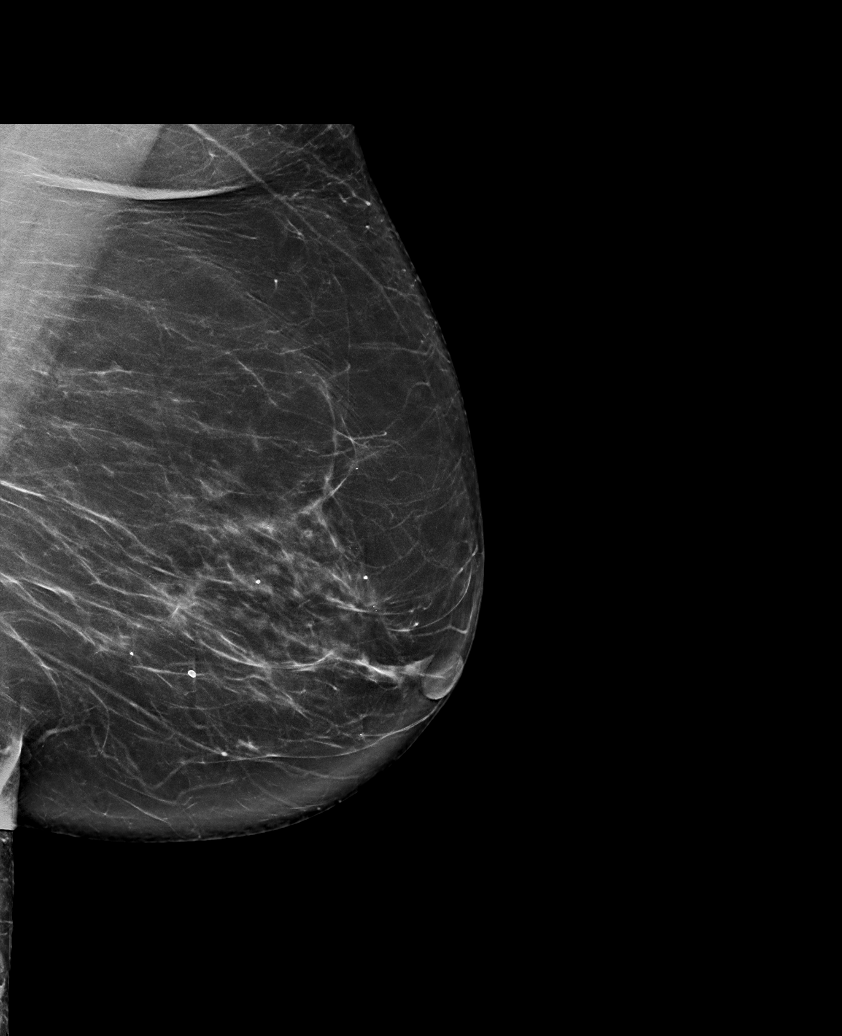

[R CC synth-2D]
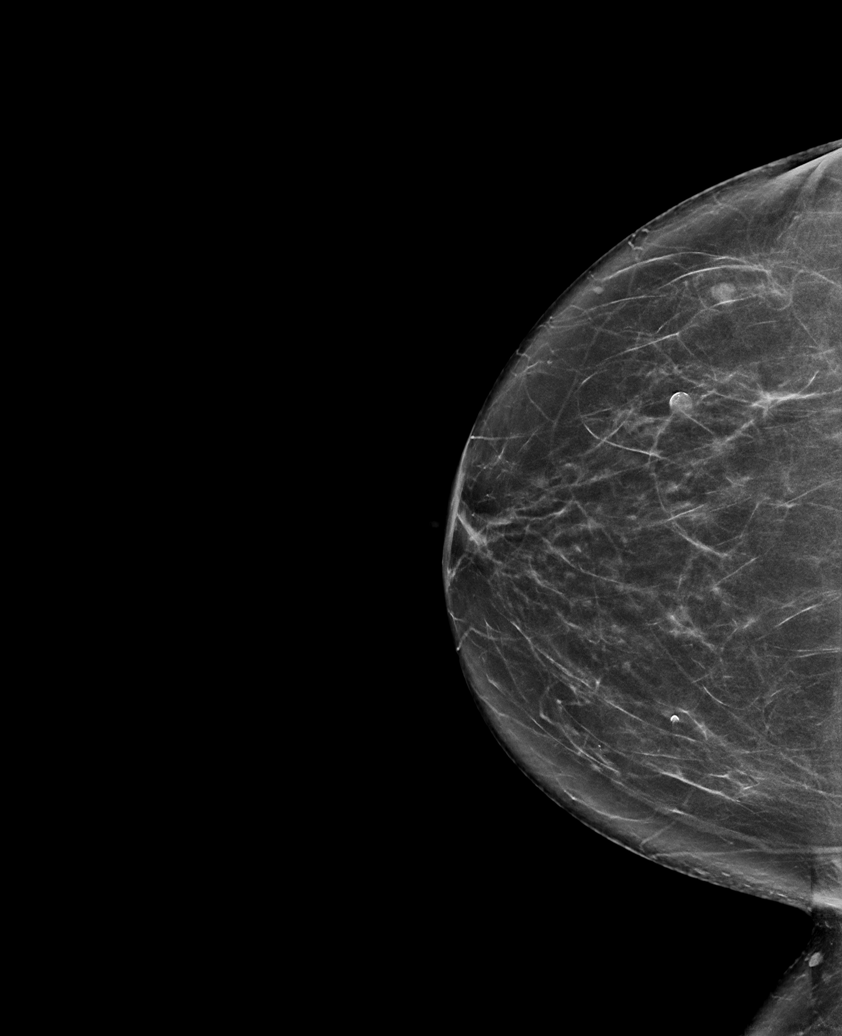

[R MLO synth-2D (2 of 2)]
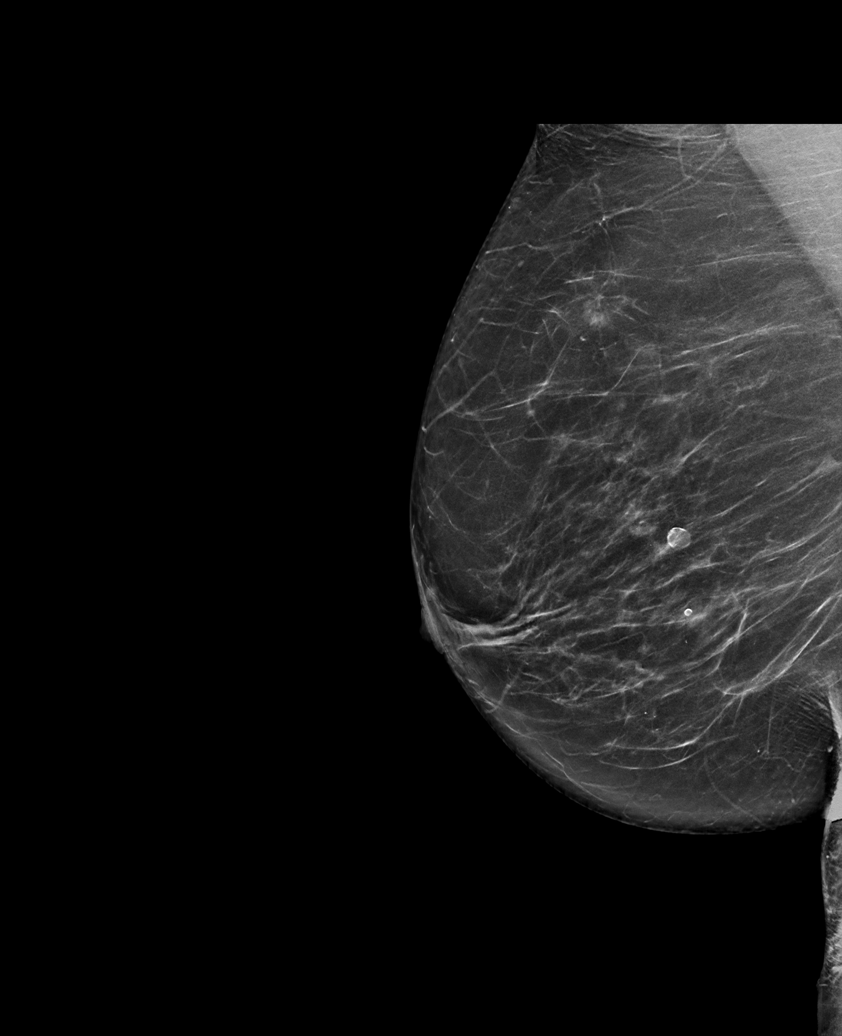

[6 of 36 positions shown; findings below may reference images not displayed]

ACR Breast Density Category b: There are scattered areas of
fibroglandular density.
FINDINGS: There are no findings suspicious for malignancy. Images were
processed with CAD.
IMPRESSION: No mammographic evidence of malignancy. A result letter of this
screening mammogram will be mailed directly to the patient.

RECOMMENDATION:
Screening mammogram in one year. (Code:[TQ])

BI-RADS CATEGORY  1: Negative.

## 2019-04-08 ENCOUNTER — Other Ambulatory Visit: Payer: Self-pay

## 2019-04-08 ENCOUNTER — Ambulatory Visit (INDEPENDENT_AMBULATORY_CARE_PROVIDER_SITE_OTHER): Payer: Medicare Other | Admitting: Cardiovascular Disease

## 2019-04-08 ENCOUNTER — Encounter: Payer: Self-pay | Admitting: Cardiovascular Disease

## 2019-04-08 VITALS — BP 134/74 | HR 58 | Temp 97.0°F | Ht 65.0 in | Wt 210.0 lb

## 2019-04-08 DIAGNOSIS — Z9861 Coronary angioplasty status: Secondary | ICD-10-CM | POA: Diagnosis not present

## 2019-04-08 DIAGNOSIS — M25473 Effusion, unspecified ankle: Secondary | ICD-10-CM | POA: Diagnosis not present

## 2019-04-08 DIAGNOSIS — R7309 Other abnormal glucose: Secondary | ICD-10-CM

## 2019-04-08 DIAGNOSIS — E785 Hyperlipidemia, unspecified: Secondary | ICD-10-CM | POA: Diagnosis not present

## 2019-04-08 DIAGNOSIS — I10 Essential (primary) hypertension: Secondary | ICD-10-CM

## 2019-04-08 DIAGNOSIS — I2510 Atherosclerotic heart disease of native coronary artery without angina pectoris: Secondary | ICD-10-CM | POA: Diagnosis not present

## 2019-04-08 DIAGNOSIS — E669 Obesity, unspecified: Secondary | ICD-10-CM

## 2019-04-08 MED ORDER — FUROSEMIDE 20 MG PO TABS: 20.0000 mg | ORAL_TABLET | Freq: Every day | ORAL | 3 refills | Status: DC

## 2019-04-08 NOTE — Patient Instructions (Signed)
Medication Instructions:  Your physician has recommended you make the following change in your medication:   STOP TAKING HYDROCHLOROTHIAZIDE START TAKING FUROSEMIDE 20 MG BY MOUTH DAILY  *If you need a refill on your cardiac medications before your next appointment, please call your pharmacy*  Lab Work: Your physician recommends that you return for lab work in January 2021:  [place] A1C   If you have labs (blood work) drawn today and your tests are completely normal, you will receive your results only by: Marland Kitchen MyChart Message (if you have MyChart) OR . A paper copy in the mail If you have any lab test that is abnormal or we need to change your treatment, we will call you to review the results.  Testing/Procedures: NONE  Follow-Up: At [facility], you and your health needs are our priority.  As part of our continuing mission to provide you with exceptional heart care, we have created designated Provider Care Teams.  These Care Teams include your primary Cardiologist (physician) and Advanced Practice Providers (APPs -  Physician Assistants and Nurse Practitioners) who all work together to provide you with the care you need, when you need it.  Your next appointment:   6 month(s)  The format for your next appointment:   Either In Person or Virtual  Provider:   Shelva Majestic, MD

## 2019-04-08 NOTE — Progress Notes (Signed)
Patient ID: Debbie [name], female   DOB: February 13, 1946, 73 y.o.   MRN: [id number]    PCP: Dr. [facility]  HPI: Debbie [name], is a 73 y.o. female who presents to the office today for a 12 month  followup evaluation.  Debbie [name] has known CAD and in 2001 suffered an acute coronary syndrome and was found to have total occlusion of her RCA. She had 3 stents placed to her RCA. Subsequent catheterization in 2006 showed all 3 stents widely patent. Additionally, she has a history of hypertension as well as hyperlipidemia. She also has a history of atrial tachycardia. A nuclear perfusion study in September 2013 showed minimal anterior thinning not felt to be significant.  Because of mild possible claudication symptoms her lower extremity, she underwent a lower extremity arterial Doppler study which suggested mild arterial insufficiency with ABIs of 0.83 bilaterally.  When I  saw her in September 2014 laboratory suggested that she had evolved into type 2 diabetes mellitus. At that time her hemoglobin A1c was 7.0 and her fasting blood sugar was 128. She had normal renal function. She subsequently saw Dr. Lou Miner who did not initiate medical therapy but to pursue aggressive dietary adjustments initially.   She was evaluated in September 2017 by Kerin Ransom after experiencing left-sided chest pressure which was not exertional and oftentimes occurring at the night.  It did not radiate to her arms, jaw, or back.  She denied associated nausea, vomiting or diaphoresis.  She was referred for nuclear perfusion study which was done on 01/19/2016.  This was a normal study.  Ejection fraction was 58%.  She underwent blood work time and on her dose of Lipitor 80 mg and Zetia 10 mg total cholesterol was 150, triglycerides 110, HDL 64, and LDL 64. There was very minimal renal insufficiency with a creatinine of 1.14.  Her fasting glucose was 150.    I saw her in November 2018 at which time she was remaining  stable and denied chest pain PND orthopnea presyncope or syncope.   I last saw her on March 26, 2018 at which time she was continuing to do well and new to be on combination amlodipine/atorvastatin 10/80 in addition to Zetia 10 mg, HCTZ 25 mg, Toprol-XL 50 mg, and ramipril 10 mg daily for hypertension as well as hyperlipidemia.  At that time there was no chest pain or shortness of breath.   The past year, she states that her blood pressure has been fairly stable.  She denies any anginal type symptoms.  She has started to notice swelling of her ankles bilaterally despite taking HCTZ 25 mg daily.  She continues to be on Caduet 10/80, HCTZ 25 mg daily, and ramipril 10 mg.  She is on Zetia 10 mg for hyperlipidemia.  There is no chest pain PND orthopnea, presyncope or syncope.  She is unaware of palpitations.  She presents for yearly evaluation.  Past Medical History:  Diagnosis Date  . Arthritis   . CAD (coronary artery disease) Aug 2001   RCA stent X3, patent '06. Nuc low risk 9/13  . Cataract    beginning stages  . History of stress test 01/08/2012   Showed minimal anterior thinning not felt to be significant.  Marland Kitchen Hx of echocardiogram 12/12/2011   EF>55%  . Hypercholesteremia   . Hypertension   . Myocardial infarction ([place])    43 years ago    age 29  . PAF (paroxysmal atrial fibrillation) [facility]) Aug  2013   SSS component with some bradycardia  . Stroke ([place]) 1998  . Vitamin D deficiency     Past Surgical History:  Procedure Laterality Date  . CHOLECYSTECTOMY    . COLONOSCOPY    . CORONARY ANGIOGRAM  May 2006   patent stents  . CORONARY ANGIOPLASTY WITH STENT PLACEMENT  Aug 2001   X 3    No Known Allergies  Current Outpatient Medications  Medication Sig Dispense Refill  . amLODipine-atorvastatin (CADUET) 10-80 MG tablet Take 1 tablet by mouth every evening. 90 tablet 3  . aspirin EC 81 MG tablet Take 1 tablet (81 mg total) by mouth daily. 90 tablet 3  . ezetimibe (ZETIA) 10 MG  tablet Take 1 tablet (10 mg total) by mouth daily. 90 tablet 1  . furosemide (LASIX) 20 MG tablet Take 1 tablet (20 mg total) by mouth daily. 90 tablet 3  . metoprolol succinate (TOPROL-XL) 50 MG 24 hr tablet Take 1 tablet (50 mg total) by mouth daily. Take with or immediately following a meal. 90 tablet 3  . Multiple Vitamins-Minerals (CENTRUM SILVER ADULT 50+) TABS Take 1 tablet by mouth daily.    . nitroGLYCERIN (NITROSTAT) 0.4 MG SL tablet Place 1 tablet (0.4 mg total) under the tongue every 5 (five) minutes as needed for chest pain. (Patient not taking: Reported on 05/08/2018) 25 tablet 3  . ramipril (ALTACE) 10 MG capsule Take 1 capsule (10 mg total) by mouth daily. 90 capsule 3   Current Facility-Administered Medications  Medication Dose Route Frequency Provider Last Rate Last Admin  . 0.9 %  sodium chloride infusion  500 mL Intravenous Once Milus Banister, MD        Socially she denies tobacco or alcohol. She does not routinely exercise.   ROS General: Negative; No fevers, chills, or night sweats;  HEENT: Negative; No changes in vision or hearing, sinus congestion, difficulty swallowing Pulmonary: Negative; No cough, wheezing, shortness of breath, hemoptysis Cardiovascular: Negative; No chest pain, presyncope, syncope, palpitations GI: Negative; No nausea, vomiting, diarrhea, or abdominal pain GU: Negative; No dysuria, hematuria, or difficulty voiding Musculoskeletal: Negative; no myalgias, joint pain, or weakness Hematologic/Oncology: Negative; no easy bruising, bleeding Endocrine: Negative; no heat/cold intolerance; no diabetes Neuro: Negative; no changes in balance, headaches Skin: Negative; No rashes or skin lesions Psychiatric: Negative; No behavioral problems, depression Sleep: Negative; No snoring, daytime sleepiness, hypersomnolence, bruxism, restless legs, hypnogognic hallucinations, no cataplexy Other comprehensive 14 point system review is negative.   PE BP 134/74    Pulse (!) 58   Temp (!) 97 F (36.1 C)   Ht '5\' 5"'$  (1.651 m)   Wt 210 lb (95.3 kg)   SpO2 100%   BMI 34.95 kg/m    Repeat blood pressure by me was elevated at 142/78  Wt Readings from Last 3 Encounters:  04/08/19 210 lb (95.3 kg)  05/08/18 202 lb (91.6 kg)  03/26/18 202 lb 6.4 oz (91.8 kg)   General: Alert, oriented, no distress.  Skin: normal turgor, no rashes, warm and dry HEENT: Normocephalic, atraumatic. Pupils equal round and reactive to light; sclera anicteric; extraocular muscles intact;  Nose without nasal septal hypertrophy Mouth/Parynx benign; Mallinpatti scale 3 Neck: No JVD, no carotid bruits; normal carotid upstroke Lungs: clear to ausculatation and percussion; no wheezing or rales Chest wall: without tenderness to palpitation Heart: PMI not displaced, RRR, s1 s2 normal, 1/6 systolic murmur, no diastolic murmur, no rubs, gallops, thrills, or heaves Abdomen: Mild central adiposity; soft, nontender; no  hepatosplenomehaly, BS+; abdominal aorta nontender and not dilated by palpation. Back: no CVA tenderness Pulses 2+ Musculoskeletal: full range of motion, normal strength, no joint deformities Extremities: no clubbing cyanosis or edema, Homan's sign negative  Neurologic: grossly nonfocal; Cranial nerves grossly wnl Psychologic: Normal mood and affect   ECG (independently read by me): Sinus bradycardia 58 bpm with an isolated PVC.  Nonspecific T wave abnormality inferolaterally.  December 2019 ECG (independently read by me): Sinus bradycardia 59 bpm.  PAC.  November 2018 ECG (independently read by me): Sinus bradycardia 53 with sinus arrhythmia.  Small inferior Q waves.  November 2017 ECG (independently read by me): Sinus bradycardia at 53 with mild sinus arrhythmia.  Nonspecific T changes.  September 2016 ECG (independently read by me): Sinus bradycardia 55 bpm.  No significant ST-T changes.  March 2015 ECG (independentlyby me): Sinus bradycardia 55 beats per  minute. No ectopy.  Prior September,16 2014 ECG: Sinus bradycardia 48 beats per minute. Intervals normal  LABS: BMP Latest Ref Rng & Units 03/26/2018 11/06/2017 01/18/2016  Glucose 65 - 99 mg/dL 140(H) 144(H) 150(H)  BUN 8 - 27 mg/dL 14 19 29(H)  Creatinine 0.57 - 1.00 mg/dL 0.95 1.14(H) 1.14(H)  BUN/Creat Ratio 12 - '28 15 17 '$ -  Sodium 134 - 144 mmol/L 143 141 139  Potassium 3.5 - 5.2 mmol/L 4.1 4.2 4.5  Chloride 96 - 106 mmol/L 105 106 108  CO2 20 - 29 mmol/L '22 21 20  '$ Calcium 8.7 - 10.3 mg/dL 10.1 10.1 9.9   Hepatic Function Latest Ref Rng & Units 03/26/2018 01/18/2016 01/09/2015  Total Protein 6.0 - 8.5 g/dL 6.8 6.6 6.9  Albumin 3.5 - 4.8 g/dL 4.3 4.1 4.1  AST 0 - 40 IU/L '14 15 19  '$ ALT 0 - 32 IU/L '20 17 27  '$ Alk Phosphatase 39 - 117 IU/L 92 72 77  Total Bilirubin 0.0 - 1.2 mg/dL 0.7 0.4 0.6   CBC Latest Ref Rng & Units 03/26/2018 01/09/2015 09/29/2012  WBC 3.4 - 10.8 x10E3/uL 8.2 7.9 8.3  Hemoglobin 11.1 - 15.9 g/dL 12.4 14.0 13.7  Hematocrit 34.0 - 46.6 % 36.8 40.5 40.6  Platelets 150 - 450 x10E3/uL 327 353 360   Lab Results  Component Value Date   MCV 88 03/26/2018   MCV 89.0 01/09/2015   MCV 89.8 09/29/2012   Lab Results  Component Value Date   TSH 2.080 03/26/2018   Lab Results  Component Value Date   HGBA1C 7.1 (H) 01/09/2015   Lipid Panel     Component Value Date/Time   CHOL 154 03/26/2018 1043   TRIG 103 03/26/2018 1043   HDL 62 03/26/2018 1043   CHOLHDL 2.5 03/26/2018 1043   CHOLHDL 2.3 01/18/2016 0823   VLDL 22 01/18/2016 0823   LDLCALC 71 03/26/2018 1043   RADIOLOGY: No results found.  IMPRESSION: 1. Essential hypertension   2. CAD S/P percutaneous coronary angioplasty   3. Hyperlipidemia with target LDL less than 70   4. Ankle edema   5. Elevated hemoglobin A1c   6. Mild obesity     ASSESSMENT AND PLAN: Debbie [name] is a 73 year old female who is almost 20 years following her initial multi lesion intervention to her RCA in the setting of an ACS  at which time she was found to have total RCA occlusion and had successful insertion of 3 stents.  On follow-up catheterization in 2006 all stents were widely patent.  Over the past 2 decades he has been aggressively treated with lipid management  and has been on atorvastatin 80 mg in addition to Zetia 10 mg.  She has not had recent lipid studies over the past year and when last checked in December 2019 total cholesterol was 154, HDL 62 LDL 71 triglycerides 103.  Her blood pressure today is mildly increased based on new hypertensive guidelines.  She also has noticed leg swelling particularly of her ankles bilaterally which insistent despite taking HCTZ 25 mg daily.  As result I have recommended she discontinue hydrochlorothiazide and in its place have given her prescription for furosemide 20 mg daily.  We discussed the importance of weight loss and exercise walking at least 5 days/week of moderate intensity for at least 30 minutes if at all possible.  BMI is 34.95 consistent with obesity.  I have recommended fasting laboratory be obtained including a comprehensive metabolic panel, CBC, TSH, lipid studies as well as a hemoglobin A1c.  Remotely she had had an increased hemoglobin A1c.  She is not on any diabetic medications.  I will contact her regarding the results of lab work and adjustments will be made if necessary.  I will see her in 6 months for cardiology reevaluation or sooner problems arise.  Time spent: 25 minutes Troy Sine, MD, [facility]  04/08/2019 2:13 PM

## 2019-04-16 HISTORY — PX: ABDOMINAL HYSTERECTOMY: SHX81

## 2019-04-29 LAB — HEMOGLOBIN A1C
Est. average glucose Bld gHb Est-mCnc: 160 mg/dL
Hgb A1c MFr Bld: 7.2 % — ABNORMAL HIGH (ref 4.8–5.6)

## 2019-04-29 LAB — BASIC METABOLIC PANEL
BUN/Creatinine Ratio: 12 (ref 12–28)
BUN: 12 mg/dL (ref 8–27)
CO2: 20 mmol/L (ref 20–29)
Calcium: 9.9 mg/dL (ref 8.7–10.3)
Chloride: 101 mmol/L (ref 96–106)
Creatinine, Ser: 1.03 mg/dL — ABNORMAL HIGH (ref 0.57–1.00)
GFR calc Af Amer: 62 mL/min/{1.73_m2} (ref >59)
GFR calc non Af Amer: 54 mL/min/{1.73_m2} — ABNORMAL LOW (ref >59)
Glucose: 144 mg/dL — ABNORMAL HIGH (ref 65–99)
Potassium: 3.9 mmol/L (ref 3.5–5.2)
Sodium: 140 mmol/L (ref 134–144)

## 2019-04-29 LAB — CBC
Hematocrit: 38.3 % (ref 34.0–46.6)
Hemoglobin: 12.8 g/dL (ref 11.1–15.9)
MCH: 30.8 pg (ref 26.6–33.0)
MCHC: 33.4 g/dL (ref 31.5–35.7)
MCV: 92 fL (ref 79–97)
Platelets: 325 10*3/uL (ref 150–450)
RBC: 4.15 x10E6/uL (ref 3.77–5.28)
RDW: 12.6 % (ref 11.7–15.4)
WBC: 7.5 10*3/uL (ref 3.4–10.8)

## 2019-04-29 LAB — TSH: TSH: 1.41 u[IU]/mL (ref 0.450–4.500)

## 2019-05-13 ENCOUNTER — Ambulatory Visit: Payer: Medicare Other | Attending: Internal Medicine

## 2019-05-13 DIAGNOSIS — Z20822 Contact with and (suspected) exposure to covid-19: Secondary | ICD-10-CM

## 2019-05-14 LAB — NOVEL CORONAVIRUS, NAA: SARS-CoV-2, NAA: NOT DETECTED

## 2019-05-21 ENCOUNTER — Other Ambulatory Visit: Payer: Self-pay | Admitting: Cardiovascular Disease

## 2019-05-27 ENCOUNTER — Ambulatory Visit: Payer: Medicare PPO | Attending: Family

## 2019-05-27 DIAGNOSIS — Z23 Encounter for immunization: Secondary | ICD-10-CM

## 2019-05-27 NOTE — Progress Notes (Signed)
   Covid-19 Vaccination Clinic  Name:  Treazure Schone    MRN: [id number] DOB: [birth year]  05/27/2019  Ms. Boghossian was observed post Covid-19 immunization for 15 minutes without incidence. She was provided with Vaccine Information Sheet and instruction to access the V-Safe system.   Ms. Lute was instructed to call 911 with any severe reactions post vaccine: Marland Kitchen Difficulty breathing  . Swelling of your face and throat  . A fast heartbeat  . A bad rash all over your body  . Dizziness and weakness    Immunizations Administered    Name Date Dose VIS Date Route   Moderna COVID-19 Vaccine 05/27/2019  1:58 PM 0.5 mL 03/16/2019 Intramuscular   Manufacturer: Moderna   Lot: [id number]   [id number]

## 2019-06-29 ENCOUNTER — Encounter (HOSPITAL_COMMUNITY): Payer: Self-pay | Admitting: Emergency Medicine

## 2019-06-29 ENCOUNTER — Emergency Department (HOSPITAL_COMMUNITY): Payer: Medicare PPO

## 2019-06-29 ENCOUNTER — Ambulatory Visit: Payer: Medicare PPO | Attending: Family

## 2019-06-29 ENCOUNTER — Other Ambulatory Visit: Payer: Self-pay

## 2019-06-29 ENCOUNTER — Emergency Department (HOSPITAL_COMMUNITY)
Admission: EM | Admit: 2019-06-29 | Discharge: 2019-06-29 | Disposition: A | Discharge status: Home / Self Care | Payer: Medicare PPO | Attending: Emergency Medicine | Admitting: Emergency Medicine

## 2019-06-29 DIAGNOSIS — M25551 Pain in right hip: Secondary | ICD-10-CM | POA: Diagnosis present

## 2019-06-29 DIAGNOSIS — I2510 Atherosclerotic heart disease of native coronary artery without angina pectoris: Secondary | ICD-10-CM | POA: Insufficient documentation

## 2019-06-29 DIAGNOSIS — M545 Low back pain, unspecified: Secondary | ICD-10-CM

## 2019-06-29 DIAGNOSIS — Z8673 Personal history of transient ischemic attack (TIA), and cerebral infarction without residual deficits: Secondary | ICD-10-CM | POA: Insufficient documentation

## 2019-06-29 DIAGNOSIS — I252 Old myocardial infarction: Secondary | ICD-10-CM | POA: Insufficient documentation

## 2019-06-29 DIAGNOSIS — Z955 Presence of coronary angioplasty implant and graft: Secondary | ICD-10-CM | POA: Diagnosis not present

## 2019-06-29 DIAGNOSIS — R52 Pain, unspecified: Secondary | ICD-10-CM

## 2019-06-29 DIAGNOSIS — F17210 Nicotine dependence, cigarettes, uncomplicated: Secondary | ICD-10-CM | POA: Diagnosis not present

## 2019-06-29 DIAGNOSIS — Z79899 Other long term (current) drug therapy: Secondary | ICD-10-CM | POA: Insufficient documentation

## 2019-06-29 DIAGNOSIS — Z23 Encounter for immunization: Secondary | ICD-10-CM

## 2019-06-29 DIAGNOSIS — I10 Essential (primary) hypertension: Secondary | ICD-10-CM | POA: Insufficient documentation

## 2019-06-29 IMAGING — CT CT RENAL STONE PROTOCOL
2 of 4 series · 17 of 46 positions shown, 19 images · non-contrast
Comparison: None.

CLINICAL DATA: Flank pain

EXAM:
CT ABDOMEN AND PELVIS WITHOUT CONTRAST
TECHNIQUE: Multidetector CT imaging of the abdomen and pelvis was performed
following the standard protocol without IV contrast.

[Series 3: renal stone 5.0 · axial · 0.74mm/px · z∈[+666,+1106]mm · 14 of 97 slices shown, 16 images]
[im 5/97  soft-tissue]
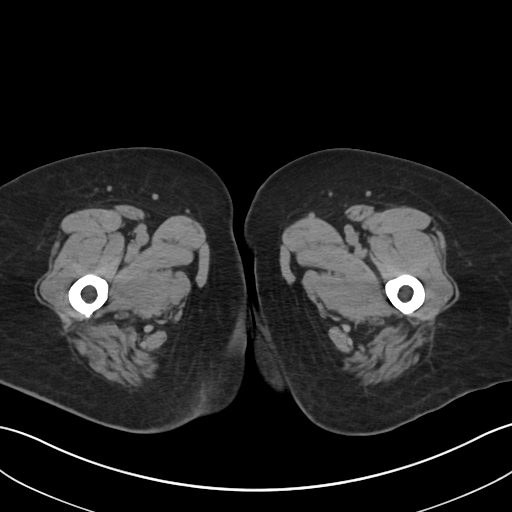
[im 5/97  bone]
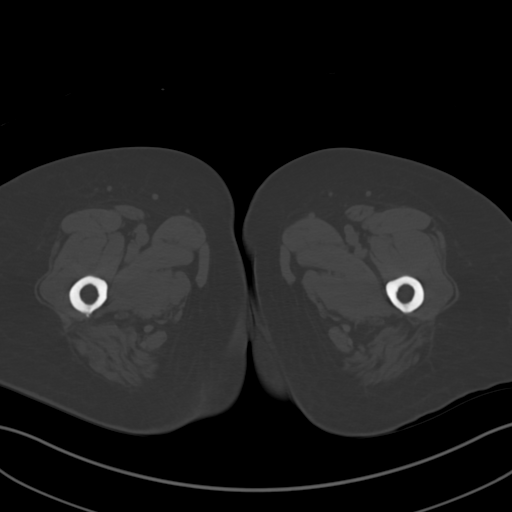
[im 13/97  soft-tissue]
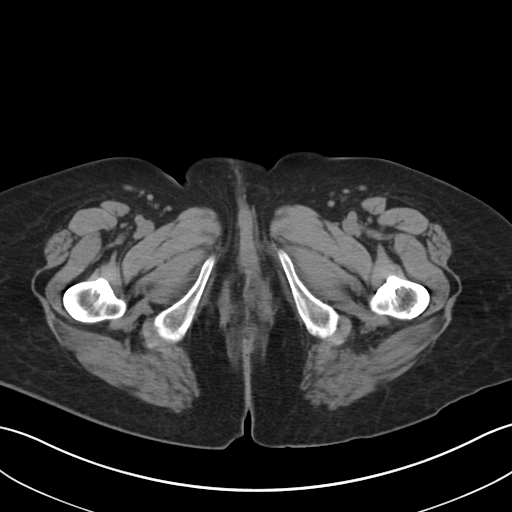
[im 21/97  soft-tissue]
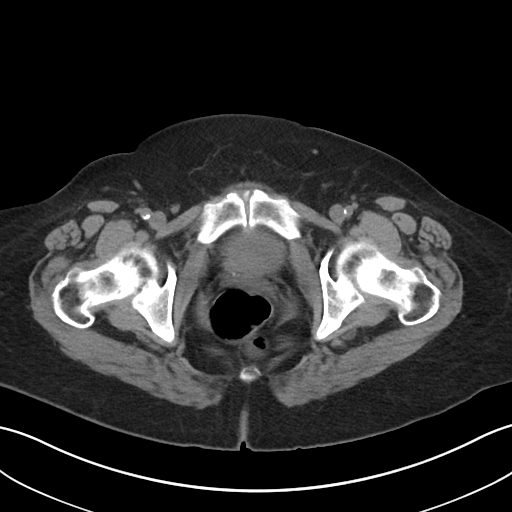
[im 25/97  soft-tissue]
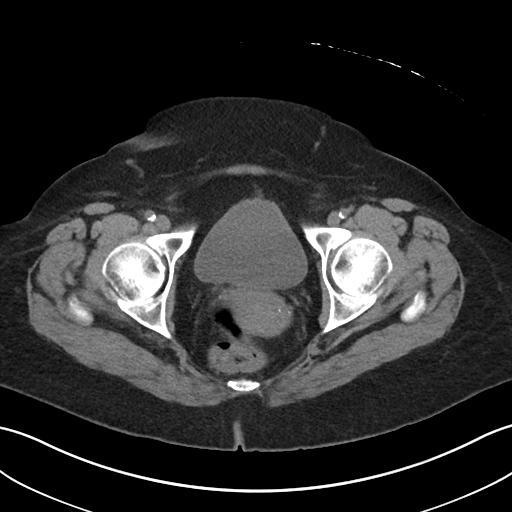
[im 33/97  soft-tissue]
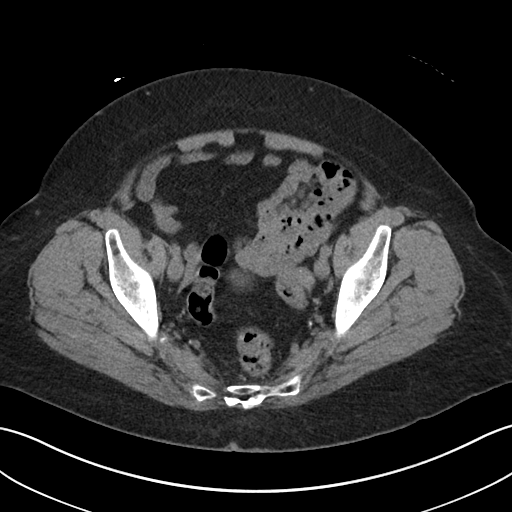
[im 41/97  soft-tissue]
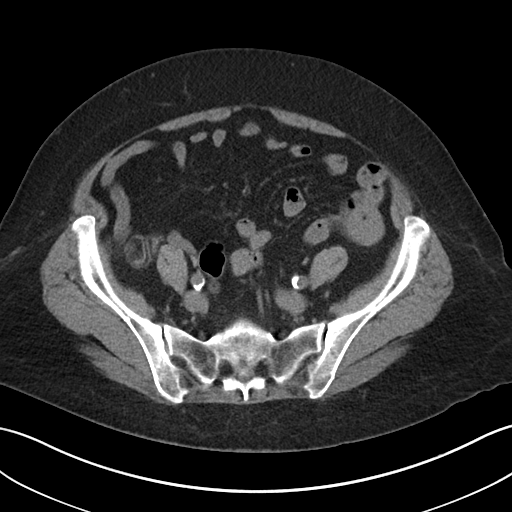
[im 45/97  soft-tissue]
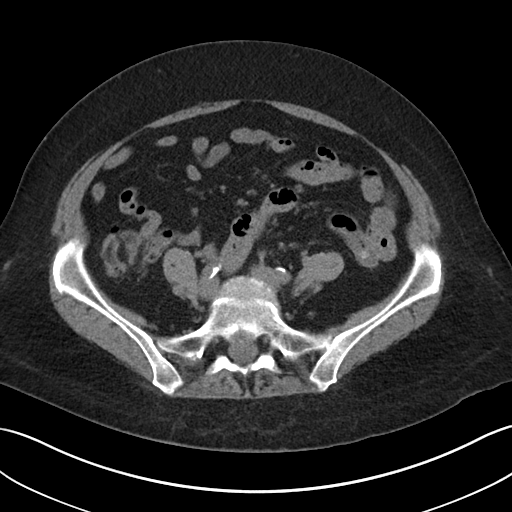
[im 53/97  soft-tissue]
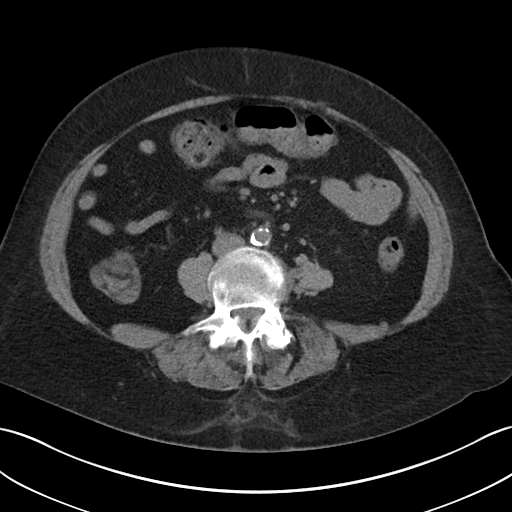
[im 57/97  soft-tissue]
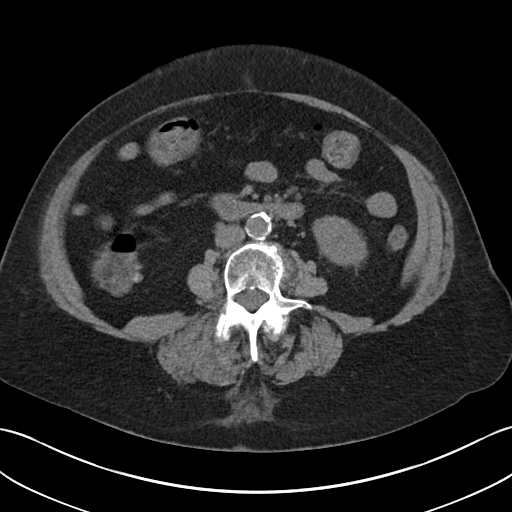
[im 57/97  bone]
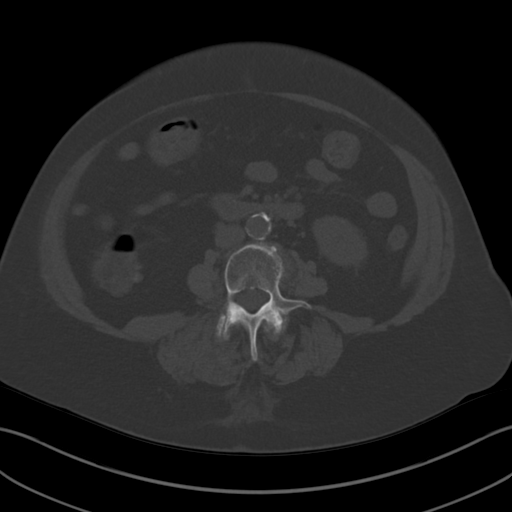
[im 65/97  soft-tissue]
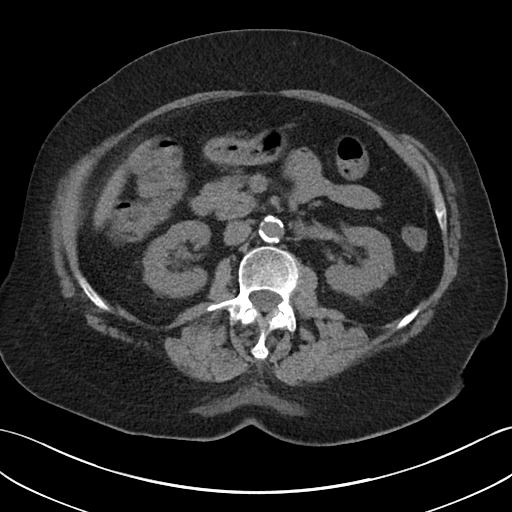
[im 73/97  soft-tissue]
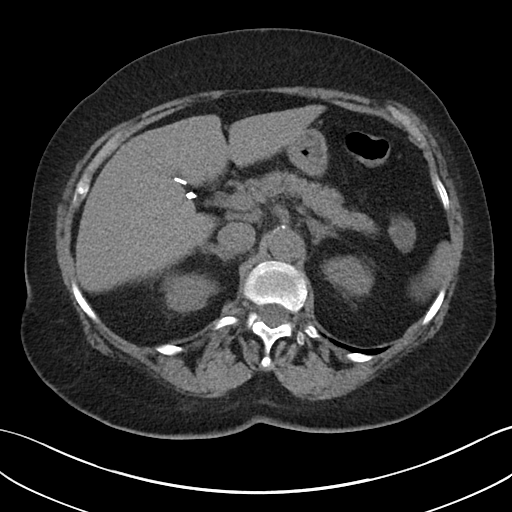
[im 77/97  soft-tissue]
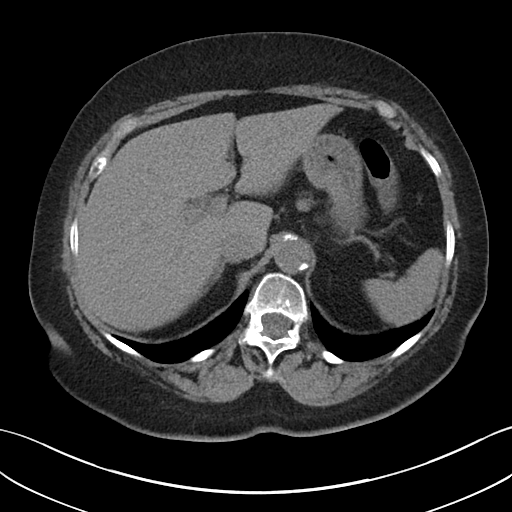
[im 85/97  soft-tissue]
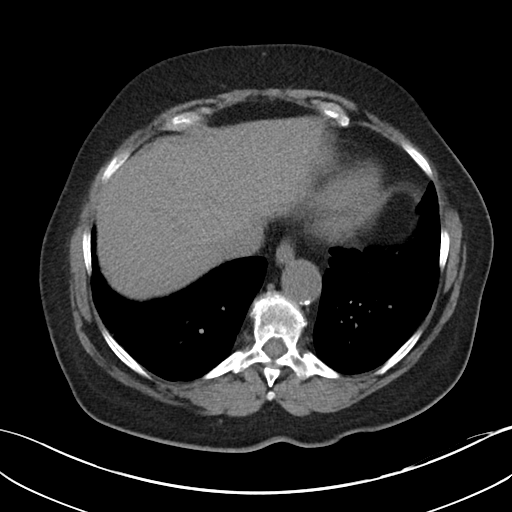
[im 93/97  soft-tissue]
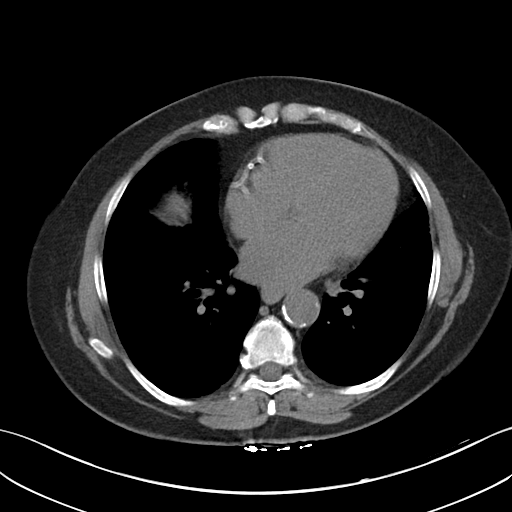

[Series 5: renal stone 3.0 cor · coronal · 0.85mm/px · 3 of 112 slices shown]
[im 38/112  soft-tissue]
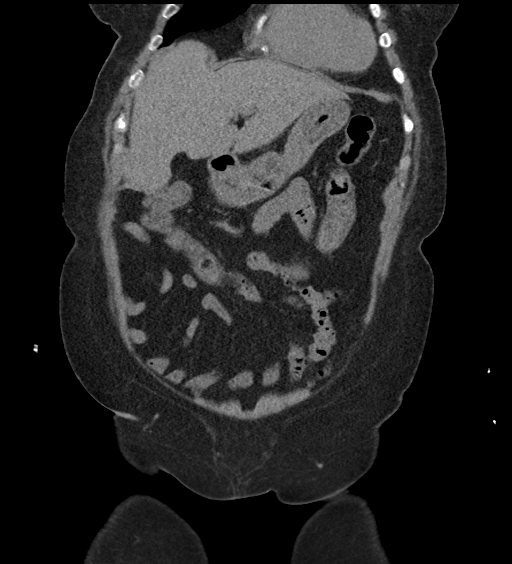
[im 50/112  soft-tissue]
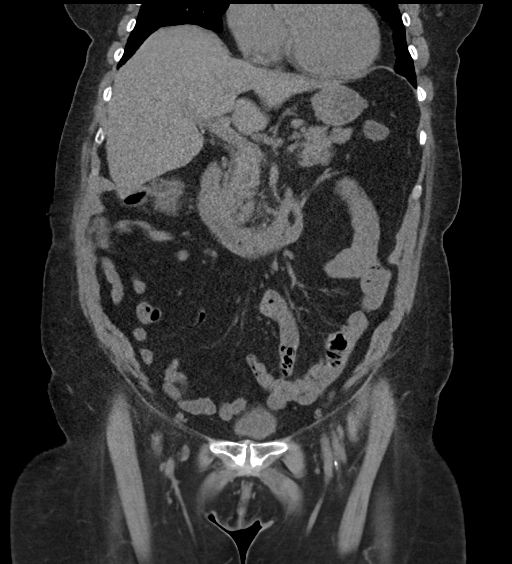
[im 62/112  soft-tissue]
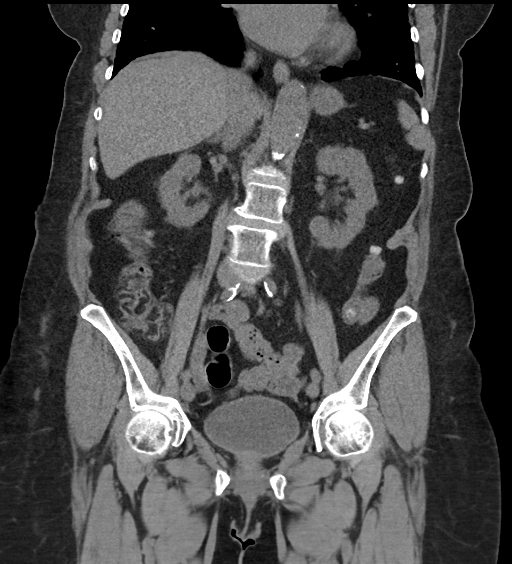

[17 of 46 positions shown; findings below may reference images not displayed]

FINDINGS: Lower chest: No acute abnormality.

Hepatobiliary: No focal liver abnormality is seen. Status post
cholecystectomy. No biliary dilatation.

Pancreas: Unremarkable.

Spleen: Unremarkable.

Adrenals/Urinary Tract: Adrenals are unremarkable. There is a 2 mm
nonobstructing calculus of the upper pole of the right kidney. No
hydronephrosis. Bladder is unremarkable.

Stomach/Bowel: Stomach is within normal limits. Bowel is normal
caliber. Few scattered colonic diverticula. Normal appendix.

Vascular/Lymphatic: Aortic atherosclerosis. No enlarged abdominal or
pelvic lymph nodes.

Reproductive: Uterus is unremarkable. There is ill-defined
hyperdensity along the left ovary, which is asymmetrically
prominent.

Other: No abdominal wall hernia or abnormality. No abdominopelvic
ascites.

Musculoskeletal: Refer to concurrent lumbar spine imaging. No acute
abnormality.
IMPRESSION: Punctate nonobstructing calculus of the right kidney.

Mild colonic diverticulosis.

Ill-defined hyperdensity along the asymmetrically prominent left
ovary probably reflecting mineralization. This could be further
evaluated with ultrasound.

## 2019-06-29 IMAGING — DX DG HIP (WITH OR WITHOUT PELVIS) 2-3V*R*
3 series · 3 of 3 positions shown · non-contrast
Comparison: Radiograph [DATE]

CLINICAL DATA: Right hip pain since [REDACTED], now [DATE], denies fall
or injury

EXAM:
DG HIP (WITH OR WITHOUT PELVIS) 2-3V RIGHT

[pelvis ap]
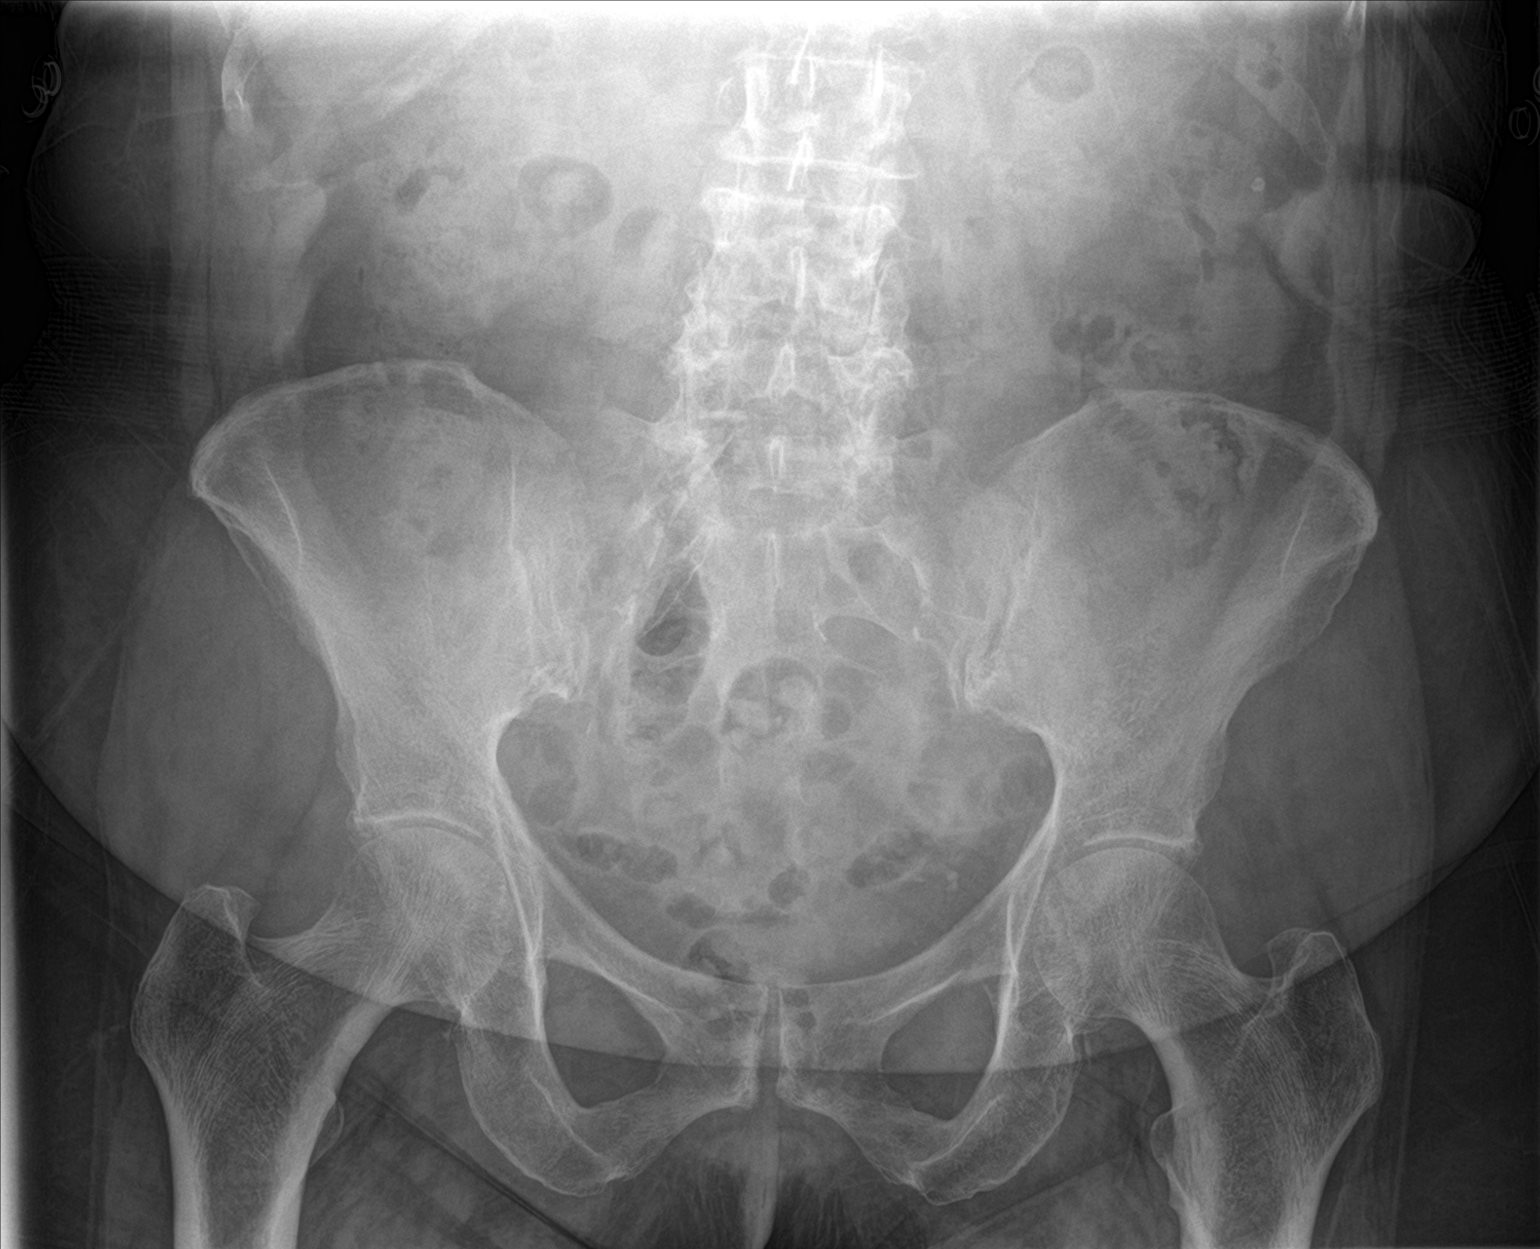

[hip ap]
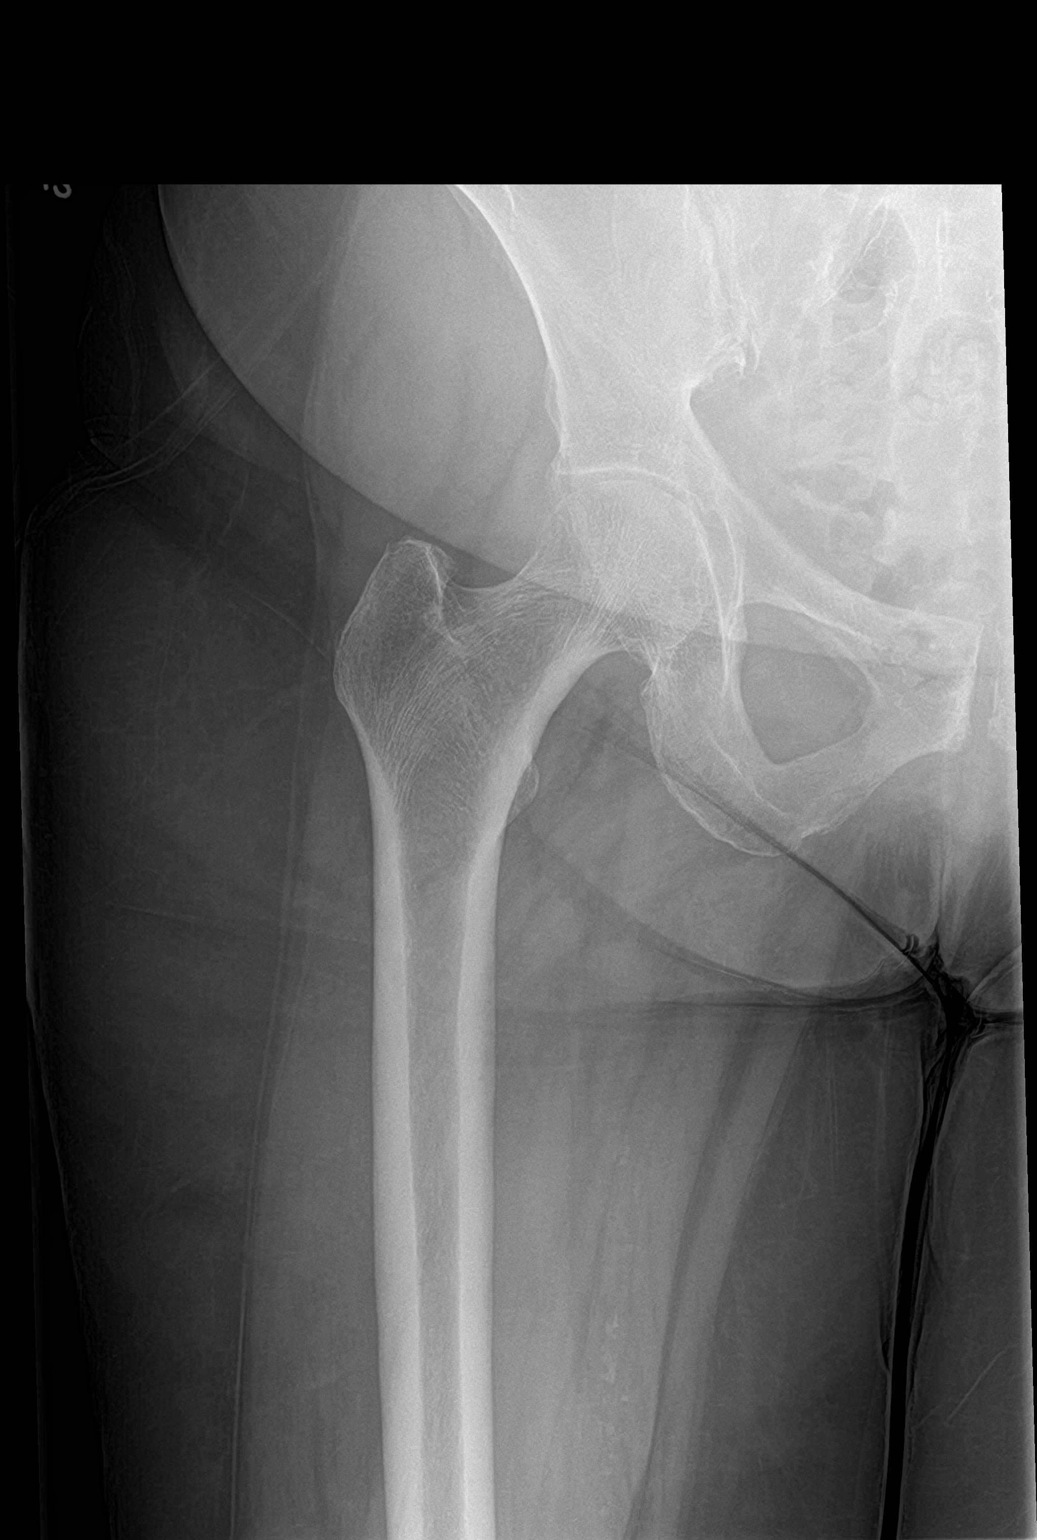

[hip lat]
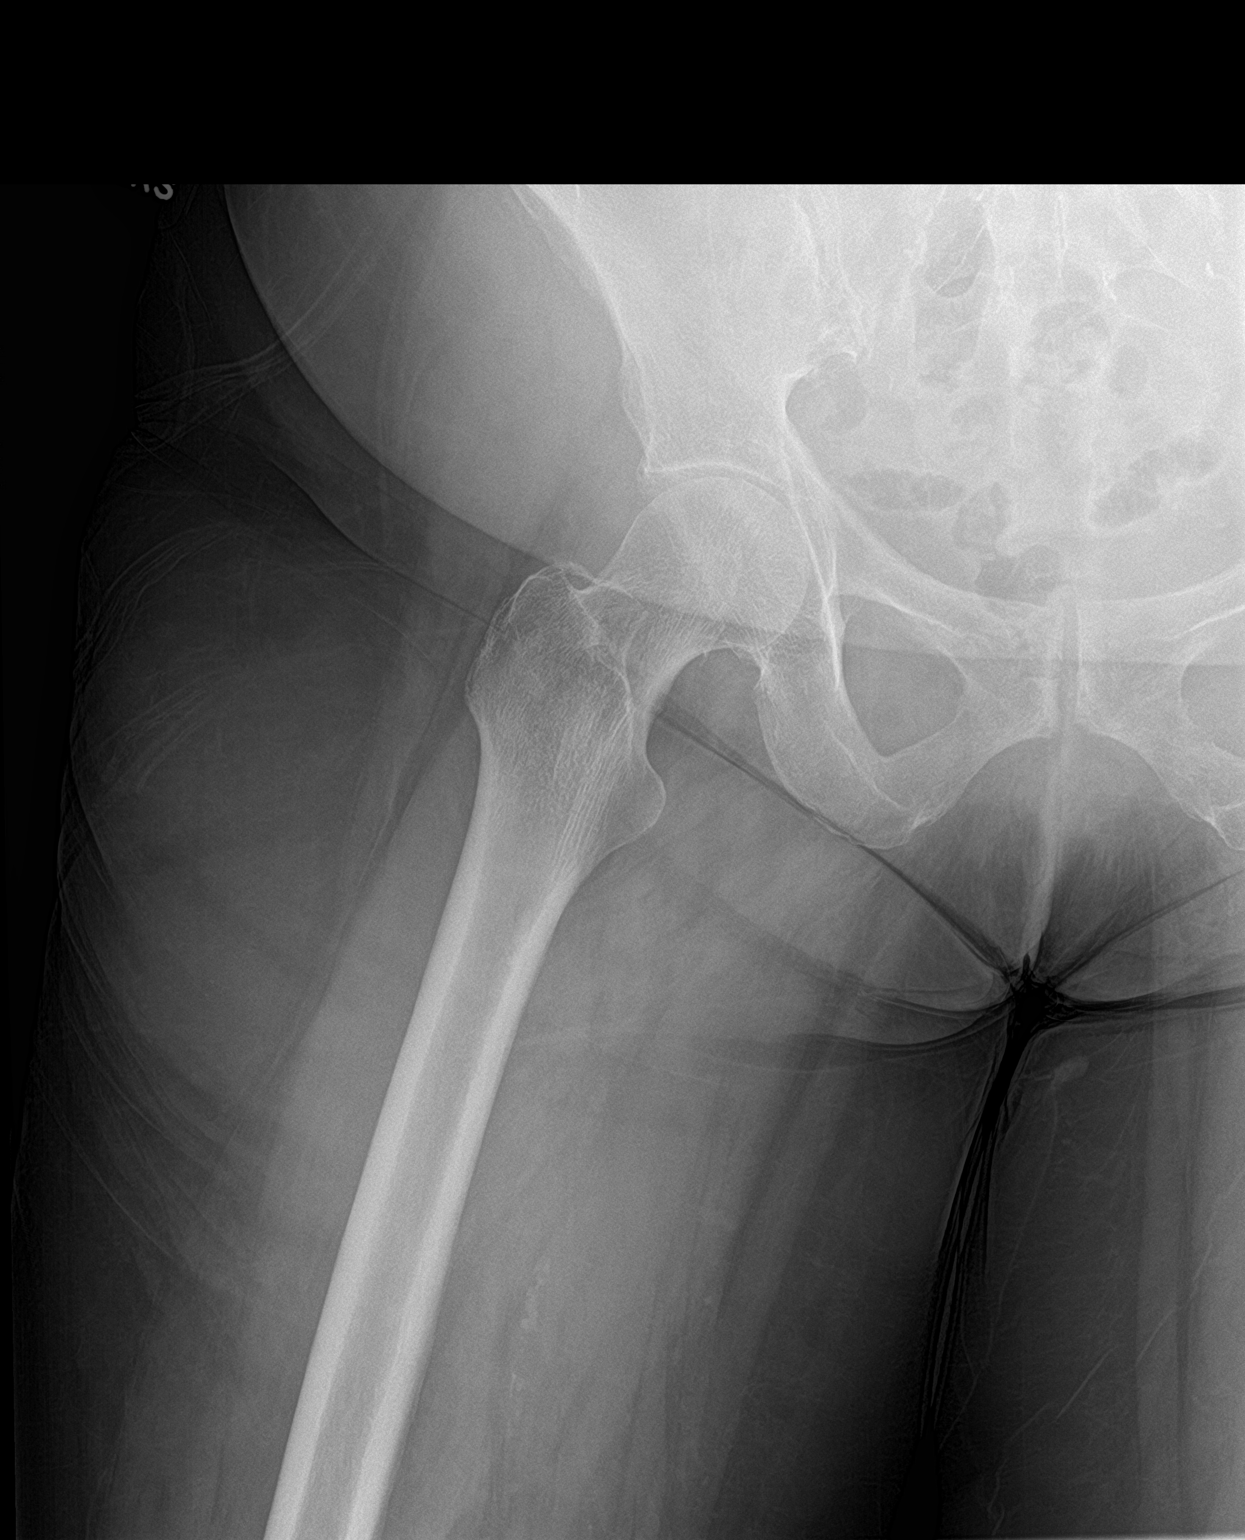

[3 of 3 positions shown; findings below may reference images not displayed]

FINDINGS: No evidence of hip fracture or dislocation. Proximal femora are
intact. Bones of the pelvis are intact and congruent. Multilevel
degenerative changes are present in the imaged portions of the
spine. Moderate degenerative changes noted at the SI joints without
sacroiliac or symphyseal widening. Moderate degenerative changes in
both hips, right slightly greater than the left. Vascular calcium
noted in the pelvis. Normal bowel gas pattern. Remaining soft
tissues are unremarkable.
IMPRESSION: Moderate degenerative changes in both hips, right slightly greater
than left. No evidence for fracture or dislocation.

## 2019-06-29 IMAGING — CT CT L SPINE W/O CM
3 series · 12 of 35 positions shown, 14 images · non-contrast
Comparison: None.

CLINICAL DATA: Right thigh pain

EXAM:
CT LUMBAR SPINE WITHOUT CONTRAST
TECHNIQUE: Multidetector CT imaging of the lumbar spine was performed without
intravenous contrast administration. Multiplanar CT image
reconstructions were also generated.

[Series 8: lspine ax · axial · 0.34mm/px · z∈[+828,+1018]mm · 4 of 137 slices shown, 5 images]
[im 21/137  soft-tissue]
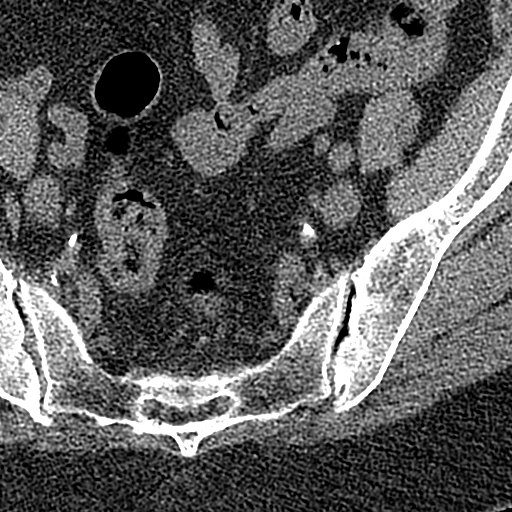
[im 21/137  bone]
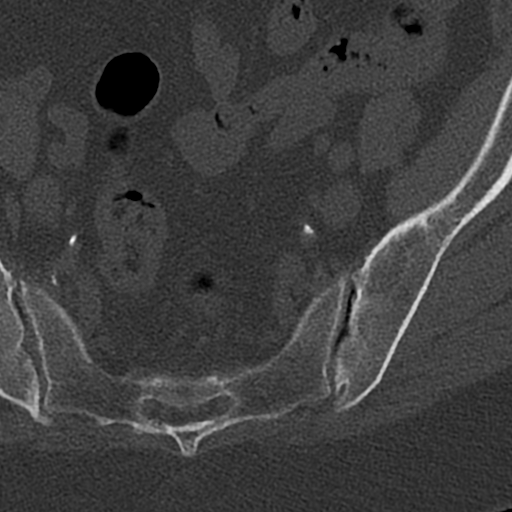
[im 53/137  bone]
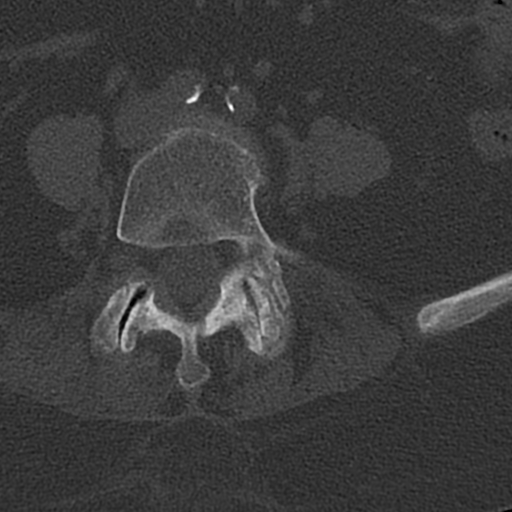
[im 84/137  bone]
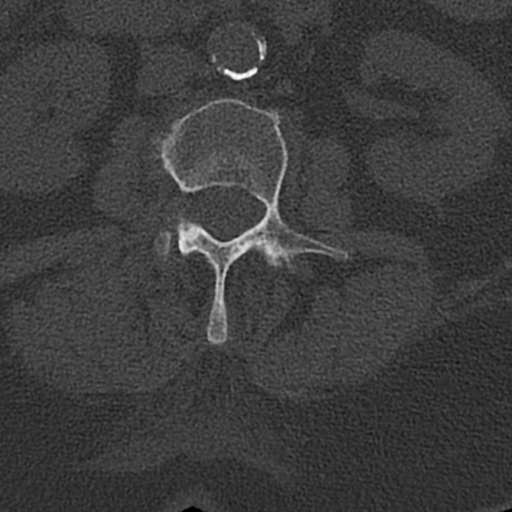
[im 116/137  bone]
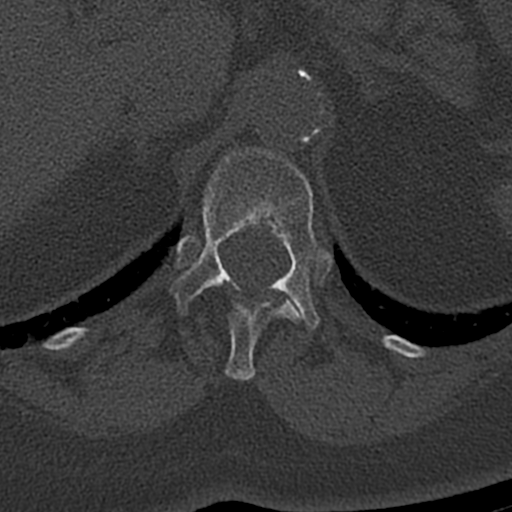

[Series 9: lspine cor · coronal · 0.34mm/px · 3 of 87 slices shown]
[im 18/87  bone]
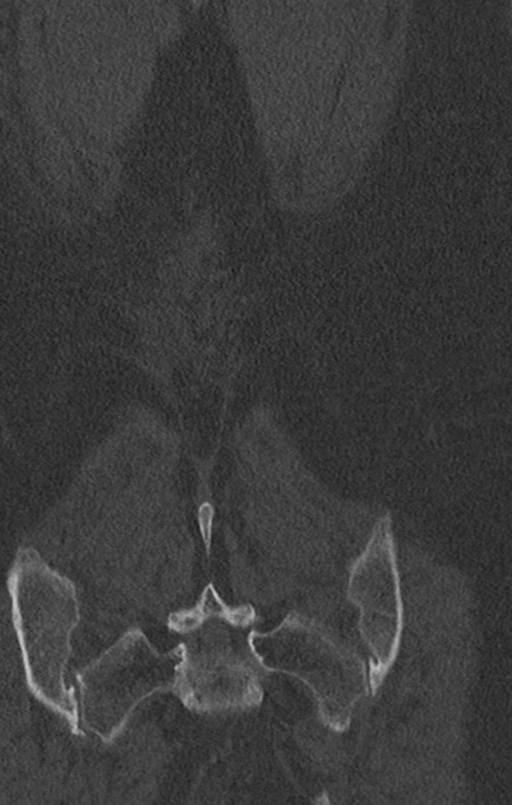
[im 35/87  bone]
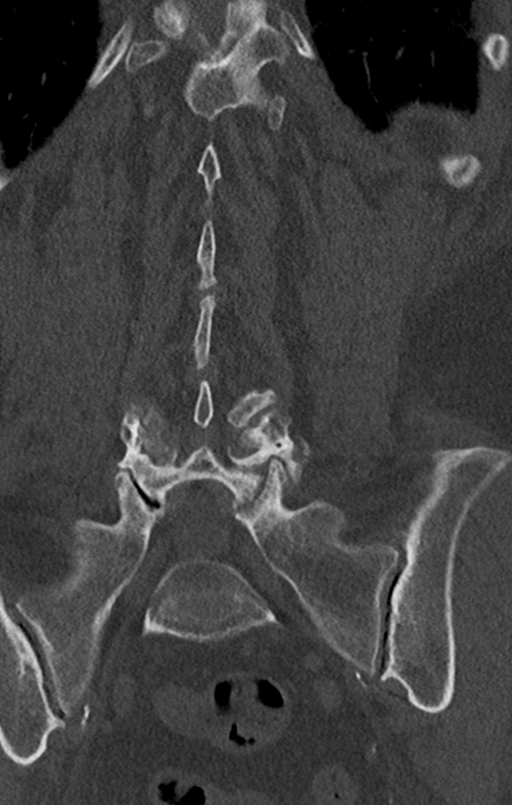
[im 52/87  bone]
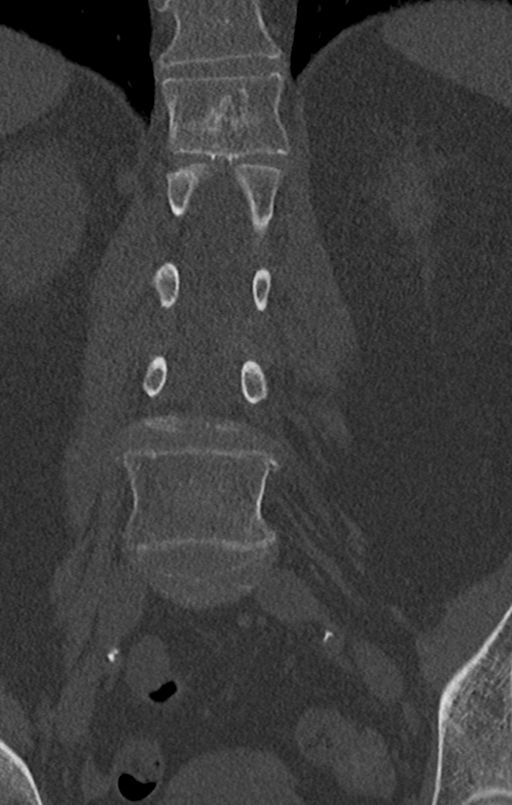

[Series 10: lspine sag · sagittal · 0.36mm/px · 5 of 103 slices shown, 6 images]
[im 35/103  bone]
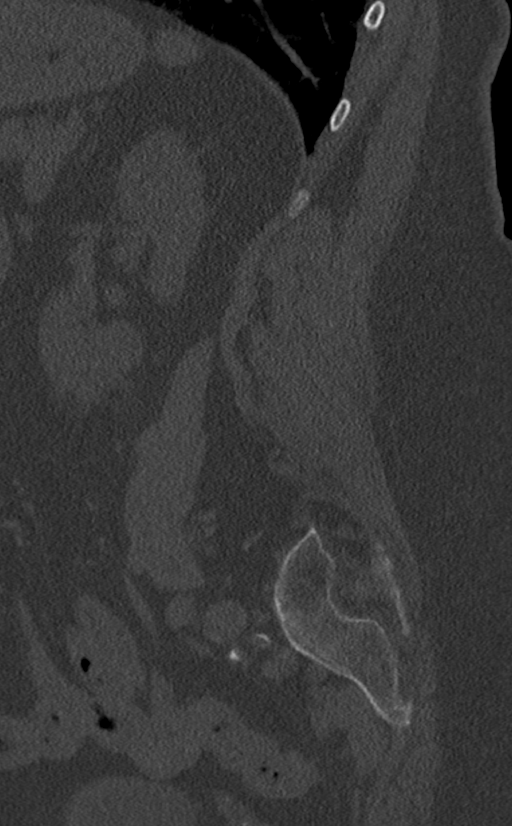
[im 43/103  bone]
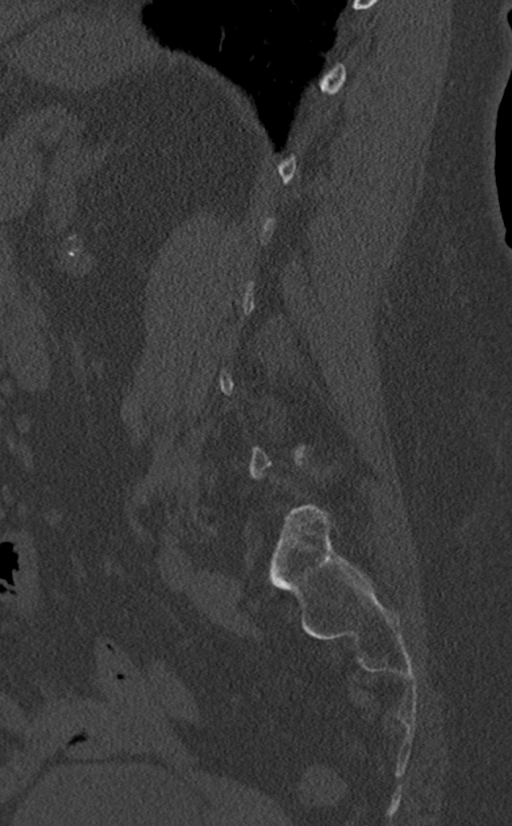
[im 52/103  soft-tissue]
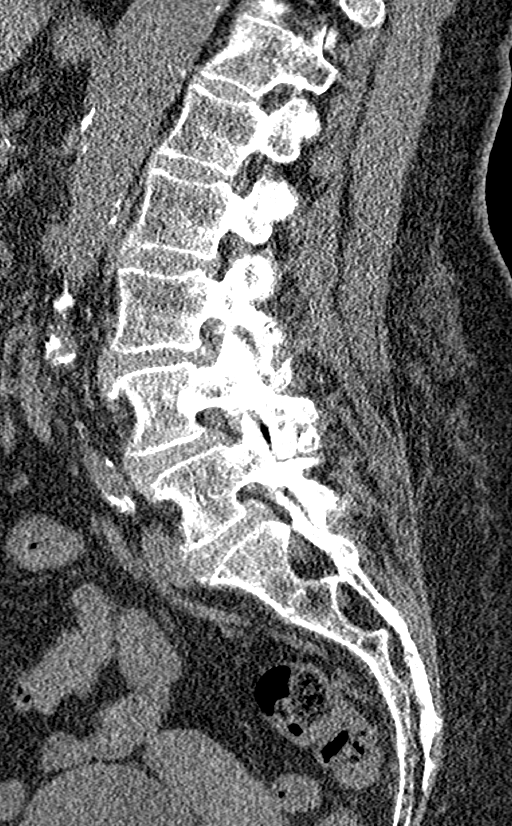
[im 52/103  bone]
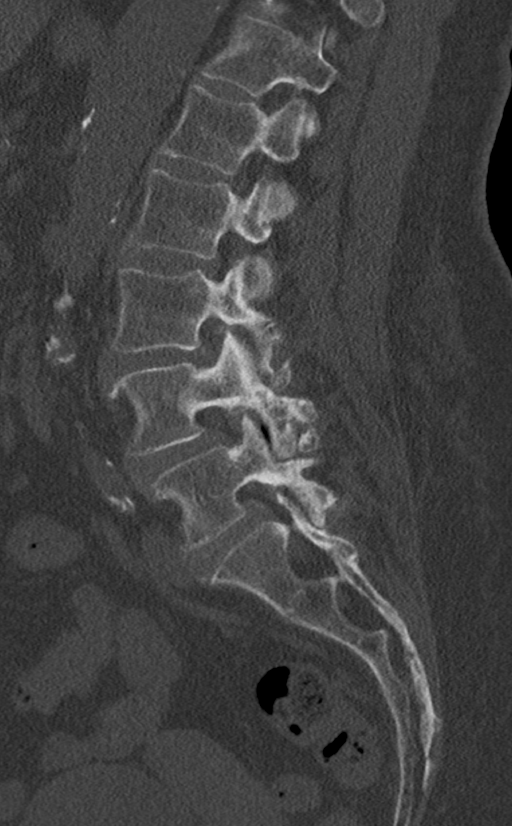
[im 60/103  bone]
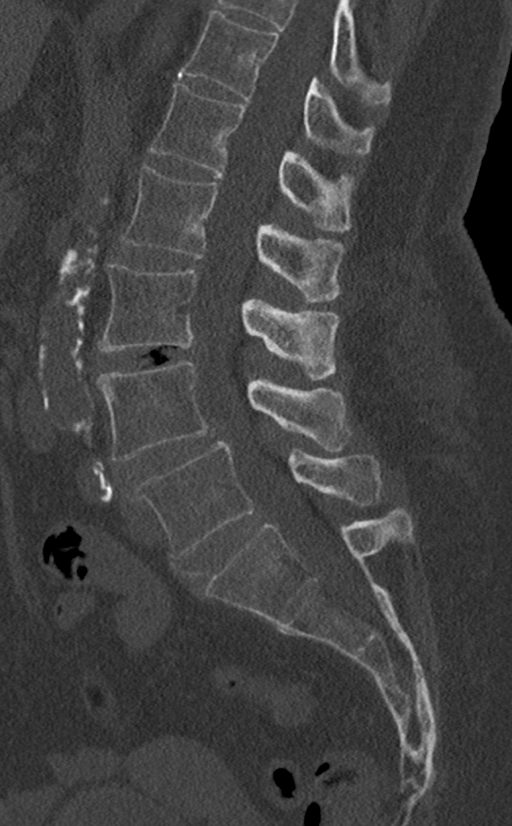
[im 69/103  bone]
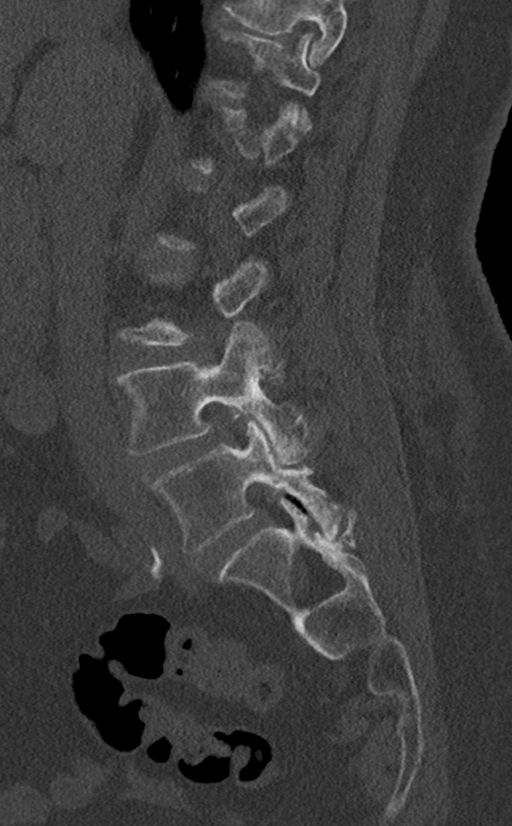

[12 of 35 positions shown; findings below may reference images not displayed]

FINDINGS: Segmentation: 5 lumbar type vertebrae.

Alignment: Mild grade 1 anterolisthesis at L4-L5 secondary to facet
arthropathy.

Vertebrae: Vertebral body heights are maintained. There is no
suspicious osseous lesion.

Paraspinal and other soft tissues: Extraspinal soft tissues are
better evaluated on concurrent body imaging.

Disc levels: No substantial disc space narrowing. There is vacuum
phenomenon at L3-L4.

L1-L2:  No significant canal or foraminal stenosis.

L2-L3: Probable minimal disc bulge. No significant canal or
foraminal stenosis.

L3-L4: Disc bulge and facet hypertrophy with ligamentum thickening.
Probable mild canal stenosis. There is right greater than left
foraminal stenosis.

L4-L5: Anterolisthesis with uncovering of disc bulge. Facet
hypertrophy with ligamentum flavum thickening. Probable moderate
canal stenosis and narrowing of the lateral recesses. Right
foraminal stenosis is present. No significant left foraminal
stenosis.

L5-S1: Disc bulge and facet hypertrophy ligamentum flavum
thickening. No significant canal or right foraminal stenosis. Mild
left foraminal stenosis is present.
IMPRESSION: Multilevel degenerative changes. Canal stenosis is greatest at
L4-L5. Right foraminal stenosis is greatest at L3-L4.

## 2019-06-29 MED ORDER — ACETAMINOPHEN 500 MG PO TABS: 1000.0000 mg | ORAL_TABLET | Freq: Once | ORAL | Status: DC

## 2019-06-29 MED ORDER — OXYCODONE HCL 5 MG PO TABS: 5.0000 mg | ORAL_TABLET | Freq: Once | ORAL | Status: DC

## 2019-06-29 MED ORDER — DIAZEPAM 2 MG PO TABS: 2.0000 mg | ORAL_TABLET | Freq: Once | ORAL | Status: DC

## 2019-06-29 MED ORDER — DICLOFENAC SODIUM 1 % EX GEL: 4.0000 g | Freq: Four times a day (QID) | CUTANEOUS | 0 refills | Status: DC

## 2019-06-29 MED ORDER — KETOROLAC TROMETHAMINE 15 MG/ML IJ SOLN: 15.0000 mg | Freq: Once | INTRAMUSCULAR | Status: DC

## 2019-06-29 NOTE — Progress Notes (Signed)
   Covid-19 Vaccination Clinic  Name:  Debbie [name]    MRN: [id number] DOB: 01/17/1946  06/29/2019  Ms. Debbie was observed post Covid-19 immunization for 15 minutes without incident. She was provided with Vaccine Information Sheet and instruction to access the V-Safe system.   [name] was instructed to call 911 with any severe reactions post vaccine: Marland Kitchen Difficulty breathing  . Swelling of face and throat  . A fast heartbeat  . A bad rash all over body  . Dizziness and weakness   Immunizations Administered    Name Date Dose VIS Date Route   Moderna COVID-19 Vaccine 06/29/2019  1:49 PM 0.5 mL 03/16/2019 Intramuscular   Manufacturer: Moderna   Lot: [id number]   [id number]

## 2019-06-29 NOTE — ED Notes (Signed)
Pt transported to CT ?

## 2019-06-29 NOTE — ED Triage Notes (Signed)
Right side hip pain 10/10 since last Friday, pt denies any fall or injury, states pain is getting worse every day.

## 2019-06-29 NOTE — ED Notes (Signed)
Patient verbalizes understanding of discharge instructions. Opportunity for questioning and answers were provided. Pt discharged from ED. 

## 2019-06-29 NOTE — Discharge Instructions (Signed)
  Also take tylenol 1000mg(2 extra strength) four times a day.   

## 2019-06-29 NOTE — ED Provider Notes (Signed)
[facility] EMERGENCY DEPARTMENT Provider Note   CSN: [id number] Arrival date & time: 06/29/19  0209     History Chief Complaint  Patient presents with  . Hip Pain    Debbie [name] is a 74 y.o. female.  74 yo F with a cc of right hip pain.  Patient points to the anterior aspect of the hip.  States is been going on for about 5 or 6 days.  Denies any initial trauma.  Has had pain to that side off and on for some time.  Seen orthopedics a couple years ago for the same.  Stated they never found an etiology for her symptoms.  Sometimes she feels that the pain radiates down her leg.  Worse with movement and palpation and twisting.  Says that she has been unable to walk for about 5 or 6 days.  Has been able to get around at home.  Has had some chronic urinary incontinence going on for about a year not coinciding with her back pain.  Otherwise denies loss of bowel denies loss of rectal sensation denies numbness or tingling to the leg.  She is concerned that maybe she had broken her hip and so came in for evaluation.  The history is provided by the patient.  Hip Pain This is a new problem. The problem occurs constantly. The problem has not changed since onset.Pertinent negatives include no chest pain, no abdominal pain, no headaches and no shortness of breath. Nothing aggravates the symptoms. Nothing relieves the symptoms. She has tried nothing for the symptoms. The treatment provided no relief.       Past Medical History:  Diagnosis Date  . Arthritis   . CAD (coronary artery disease) Aug 2001   RCA stent X3, patent '06. Nuc low risk 9/13  . Cataract    beginning stages  . History of stress test 01/08/2012   Showed minimal anterior thinning not felt to be significant.  Marland Kitchen Hx of echocardiogram 12/12/2011   EF>55%  . Hypercholesteremia   . Hypertension   . Myocardial infarction ([place])    65 years ago    age 20  . PAF (paroxysmal atrial fibrillation) [facility]) Aug 2013   SSS component with some bradycardia  . Stroke ([place]) 1998  . Vitamin D deficiency     Patient Active Problem List   Diagnosis Date Noted  . Bilateral occipital neuralgia 11/06/2017  . CAD in native artery 03/02/2016  . Chest pain with moderate risk for cardiac etiology 01/10/2016  . DM2 (diabetes mellitus, type 2) ([place]) 12/29/2012  . CAD S/P percutaneous coronary angioplasty 09/22/2012  . History of CVA, 1998  09/22/2012  . Bradycardia Aug 2013 12/14/2011  . PAF (paroxysmal atrial fibrillation) ([place]) 12/14/2011  . Essential hypertension 12/14/2011  . Hyperlipidemia 12/14/2011    Past Surgical History:  Procedure Laterality Date  . CHOLECYSTECTOMY    . COLONOSCOPY    . CORONARY ANGIOGRAM  May 2006   patent stents  . CORONARY ANGIOPLASTY WITH STENT PLACEMENT  Aug 2001   X 3     OB History   No obstetric history on file.     Family History  Problem Relation Age of Onset  . Diabetes type II Sister   . Heart disease Sister   . Colon cancer Neg Hx   . Colon polyps Neg Hx   . Esophageal cancer Neg Hx   . Rectal cancer Neg Hx   . Stomach cancer Neg Hx  Social History   Tobacco Use  . Smoking status: Current Some Day Smoker    Years: 60.00    Types: Cigarettes  . Smokeless tobacco: Never Used  . Tobacco comment: 2-3 cigarettes per day  Substance Use Topics  . Alcohol use: Not Currently  . Drug use: No    Home Medications Prior to Admission medications   Medication Sig Start Date End Date Taking? Authorizing Provider  amLODipine-atorvastatin (CADUET) 10-80 MG tablet Take 1 tablet by mouth in the evening 05/24/19   Troy Sine, MD  aspirin EC 81 MG tablet Take 1 tablet (81 mg total) by mouth daily. 03/01/16   Troy Sine, MD  diclofenac Sodium (VOLTAREN) 1 % GEL Apply 4 g topically 4 (four) times daily. 06/29/19   Deno Etienne, DO  ezetimibe (ZETIA) 10 MG tablet Take 1 tablet by mouth once daily 05/24/19   Troy Sine, MD  furosemide (LASIX) 20 MG tablet  Take 1 tablet (20 mg total) by mouth daily. 04/08/19 04/02/20  Troy Sine, MD  metoprolol succinate (TOPROL-XL) 50 MG 24 hr tablet TAKE 1 TABLET BY MOUTH ONCE DAILY IMMEDIATELY FOLLOWING A MEAL 05/24/19   Troy Sine, MD  Multiple Vitamins-Minerals (CENTRUM SILVER ADULT 50+) TABS Take 1 tablet by mouth daily.    [provider]  nitroGLYCERIN (NITROSTAT) 0.4 MG SL tablet Place 1 tablet (0.4 mg total) under the tongue every 5 (five) minutes as needed for chest pain. Patient not taking: Reported on 05/08/2018 01/10/16 05/08/18  Erlene Quan, PA-C  ramipril (ALTACE) 10 MG capsule Take 1 capsule by mouth once daily 05/24/19   Troy Sine, MD    Allergies    Patient has no known allergies.  Review of Systems   Review of Systems  Constitutional: Negative for chills and fever.  HENT: Negative for congestion and rhinorrhea.   Eyes: Negative for redness and visual disturbance.  Respiratory: Negative for shortness of breath and wheezing.   Cardiovascular: Negative for chest pain and palpitations.  Gastrointestinal: Negative for abdominal pain, nausea and vomiting.  Genitourinary: Negative for dysuria and urgency.  Musculoskeletal: Positive for arthralgias and myalgias.  Skin: Negative for pallor and wound.  Neurological: Negative for dizziness and headaches.    Physical Exam Updated Vital Signs BP (!) 148/61   Pulse (!) 56   Temp 98.2 F (36.8 C) (Oral)   Resp 16   Ht 5\' 5"  (1.651 m)   Wt 94.8 kg   SpO2 98%   BMI 34.78 kg/m   Physical Exam Vitals and nursing note reviewed.  Constitutional:      General: She is not in acute distress.    Appearance: She is well-developed. She is not diaphoretic.  HENT:     Head: Normocephalic and atraumatic.  Eyes:     Pupils: Pupils are equal, round, and reactive to light.  Cardiovascular:     Rate and Rhythm: Normal rate and regular rhythm.     Heart sounds: No murmur. No friction rub. No gallop.   Pulmonary:     Effort:  Pulmonary effort is normal.     Breath sounds: No wheezing or rales.  Abdominal:     General: There is no distension.     Palpations: Abdomen is soft.     Tenderness: There is no abdominal tenderness.  Musculoskeletal:        General: No tenderness.     Cervical back: Normal range of motion and neck supple.  Comments: No significant pain with internal and external rotation of the hip.    No midline spinal tenderness.  Pain to the R of midline.  PMS intact distally.  No masses to the R inguinal region, no pain. Femoral pulse intact. No abdominal pain.   Skin:    General: Skin is warm and dry.  Neurological:     Mental Status: She is alert and oriented to person, place, and time.  Psychiatric:        Behavior: Behavior normal.     ED Results / Procedures / Treatments   Labs (all labs ordered are listed, but only abnormal results are displayed) Labs Reviewed - No data to display  EKG None  Radiology CT L-SPINE NO CHARGE  Result Date: 06/29/2019 CLINICAL DATA:  Right thigh pain EXAM: CT LUMBAR SPINE WITHOUT CONTRAST TECHNIQUE: Multidetector CT imaging of the lumbar spine was performed without intravenous contrast administration. Multiplanar CT image reconstructions were also generated. COMPARISON:  None. FINDINGS: Segmentation: 5 lumbar type vertebrae. Alignment: Mild grade 1 anterolisthesis at L4-L5 secondary to facet arthropathy. Vertebrae: Vertebral body heights are maintained. There is no suspicious osseous lesion. Paraspinal and other soft tissues: Extraspinal soft tissues are better evaluated on concurrent body imaging. Disc levels: No substantial disc space narrowing. There is vacuum phenomenon at L3-L4. L1-L2:  No significant canal or foraminal stenosis. L2-L3: Probable minimal disc bulge. No significant canal or foraminal stenosis. L3-L4: Disc bulge and facet hypertrophy with ligamentum thickening. Probable mild canal stenosis. There is right greater than left foraminal  stenosis. L4-L5: Anterolisthesis with uncovering of disc bulge. Facet hypertrophy with ligamentum flavum thickening. Probable moderate canal stenosis and narrowing of the lateral recesses. Right foraminal stenosis is present. No significant left foraminal stenosis. L5-S1: Disc bulge and facet hypertrophy ligamentum flavum thickening. No significant canal or right foraminal stenosis. Mild left foraminal stenosis is present. IMPRESSION: Multilevel degenerative changes. Canal stenosis is greatest at L4-L5. Right foraminal stenosis is greatest at L3-L4. Electronically Signed   By: Macy Mis M.D.   On: 06/29/2019 08:32   CT Renal Stone Study  Result Date: 06/29/2019 CLINICAL DATA:  Flank pain EXAM: CT ABDOMEN AND PELVIS WITHOUT CONTRAST TECHNIQUE: Multidetector CT imaging of the abdomen and pelvis was performed following the standard protocol without IV contrast. COMPARISON:  None. FINDINGS: Lower chest: No acute abnormality. Hepatobiliary: No focal liver abnormality is seen. Status post cholecystectomy. No biliary dilatation. Pancreas: Unremarkable. Spleen: Unremarkable. Adrenals/Urinary Tract: Adrenals are unremarkable. There is a 2 mm nonobstructing calculus of the upper pole of the right kidney. No hydronephrosis. Bladder is unremarkable. Stomach/Bowel: Stomach is within normal limits. Bowel is normal caliber. Few scattered colonic diverticula. Normal appendix. Vascular/Lymphatic: Aortic atherosclerosis. No enlarged abdominal or pelvic lymph nodes. Reproductive: Uterus is unremarkable. There is ill-defined hyperdensity along the left ovary, which is asymmetrically prominent. Other: No abdominal wall hernia or abnormality. No abdominopelvic ascites. Musculoskeletal: Refer to concurrent lumbar spine imaging. No acute abnormality. IMPRESSION: Punctate nonobstructing calculus of the right kidney. Mild colonic diverticulosis. Ill-defined hyperdensity along the asymmetrically prominent left ovary probably  reflecting mineralization. This could be further evaluated with ultrasound. Electronically Signed   By: Macy Mis M.D.   On: 06/29/2019 08:55   DG HIP UNILAT WITH PELVIS 2-3 VIEWS RIGHT  Result Date: 06/29/2019 CLINICAL DATA:  Right hip pain since Friday, now 10/10, denies fall or injury EXAM: DG HIP (WITH OR WITHOUT PELVIS) 2-3V RIGHT COMPARISON:  Radiograph September 16, 2006 FINDINGS: No evidence of hip fracture or dislocation.  Proximal femora are intact. Bones of the pelvis are intact and congruent. Multilevel degenerative changes are present in the imaged portions of the spine. Moderate degenerative changes noted at the SI joints without sacroiliac or symphyseal widening. Moderate degenerative changes in both hips, right slightly greater than the left. Vascular calcium noted in the pelvis. Normal bowel gas pattern. Remaining soft tissues are unremarkable. 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 No evidence for fracture or dislocation. Electronically Signed   By: Lovena Le M.D.   On: 06/29/2019 02:46    Procedures Procedures (including critical care time)  Medications Ordered in ED Medications  acetaminophen (TYLENOL) tablet 1,000 mg (0 mg Oral Hold 06/29/19 0810)  ketorolac (TORADOL) 15 MG/ML injection 15 mg (0 mg Intramuscular Hold 06/29/19 0810)  oxyCODONE (Oxy IR/ROXICODONE) immediate release tablet 5 mg (0 mg Oral Hold 06/29/19 0811)  diazepam (VALIUM) tablet 2 mg (0 mg Oral Hold 06/29/19 [id number])    ED Course  I have reviewed the triage vital signs and the nursing notes.  Pertinent labs & imaging results that were available during my care of the patient were reviewed by me and considered in my medical decision making (see chart for details).    MDM Rules/Calculators/A&P                      74 yo F with a cc of r hip pain.  Chronic problem for her.  Patient has been seen a couple years ago for similar complaint.  Now has reoccurred.  I  am unable to reproduce it with motion of the hip which seems less likely to be hip related more likely to be related to her back.  Will obtain a CT scan to further evaluate.  Plain film viewed by me without obvious fracture.  I offered social work evaluation for home health which the patient is declining.  CT scan without obvious acute finding.  Patient does have some degenerative disease of her low back.  We will have her follow-up with her orthopedist who she has seen previously for this pain.  Prescription of Voltaren gel.  Tylenol around-the-clock.  Return precautions for cauda equina given.  9:30 AM:  I have discussed the diagnosis/risks/treatment options with the patient and believe the pt to be eligible for discharge home to follow-up with Ortho. We also discussed returning to the ED immediately if new or worsening sx occur. We discussed the sx which are most concerning (e.g., sudden worsening pain, fever, inability to tolerate by mouth, cauda equina s/sx) that necessitate immediate return. Medications administered to the patient during their visit and any new prescriptions provided to the patient are listed below.  Medications given during this visit Medications  acetaminophen (TYLENOL) tablet 1,000 mg (0 mg Oral Hold 06/29/19 0810)  ketorolac (TORADOL) 15 MG/ML injection 15 mg (0 mg Intramuscular Hold 06/29/19 0810)  oxyCODONE (Oxy IR/ROXICODONE) immediate release tablet 5 mg (0 mg Oral Hold 06/29/19 0811)  diazepam (VALIUM) tablet 2 mg (0 mg Oral Hold 06/29/19 [id number])     The patient appears reasonably screen and/or stabilized for discharge and I doubt any other medical condition or other [facility] requiring further screening, evaluation, or treatment in the ED at this time prior to discharge.   Final Clinical Impression(s) / ED Diagnoses Final diagnoses:  Low back pain    Rx / DC Orders ED Discharge Orders         Ordered    diclofenac Sodium (  VOLTAREN) 1 % GEL  4 times daily     06/29/19  0929           Deno Etienne, DO 06/29/19 0930

## 2019-06-29 NOTE — ED Notes (Signed)
Pt ambulatory with steady gait 

## 2019-07-06 DIAGNOSIS — M545 Low back pain, unspecified: Secondary | ICD-10-CM | POA: Insufficient documentation

## 2019-08-02 DIAGNOSIS — M5136 Other intervertebral disc degeneration, lumbar region: Secondary | ICD-10-CM | POA: Insufficient documentation

## 2019-08-02 DIAGNOSIS — M51369 Other intervertebral disc degeneration, lumbar region without mention of lumbar back pain or lower extremity pain: Secondary | ICD-10-CM | POA: Insufficient documentation

## 2019-08-17 DIAGNOSIS — M4310 Spondylolisthesis, site unspecified: Secondary | ICD-10-CM | POA: Insufficient documentation

## 2019-09-11 ENCOUNTER — Other Ambulatory Visit: Payer: Self-pay | Admitting: Cardiovascular Disease

## 2019-09-15 ENCOUNTER — Other Ambulatory Visit: Payer: Self-pay | Admitting: Cardiovascular Disease

## 2019-12-02 ENCOUNTER — Other Ambulatory Visit: Payer: Self-pay | Admitting: Internal Medicine

## 2019-12-02 DIAGNOSIS — Z1231 Encounter for screening mammogram for malignant neoplasm of breast: Secondary | ICD-10-CM

## 2020-01-05 ENCOUNTER — Ambulatory Visit
Admission: RE | Admit: 2020-01-05 | Discharge: 2020-01-05 | Disposition: A | Discharge status: Home / Self Care | Payer: Medicare PPO | Source: Ambulatory Visit | Attending: Internal Medicine | Admitting: Internal Medicine

## 2020-01-05 ENCOUNTER — Other Ambulatory Visit: Payer: Self-pay

## 2020-01-05 DIAGNOSIS — Z1231 Encounter for screening mammogram for malignant neoplasm of breast: Secondary | ICD-10-CM

## 2020-01-05 IMAGING — MG DIGITAL SCREENING BILAT W/ TOMO W/ CAD
8 series · 8 of 24 positions shown · non-contrast
Comparison: Previous exam(s).

CLINICAL DATA: Screening.

EXAM:
DIGITAL SCREENING BILATERAL MAMMOGRAM WITH TOMO AND CAD

[R CC synth-2D]
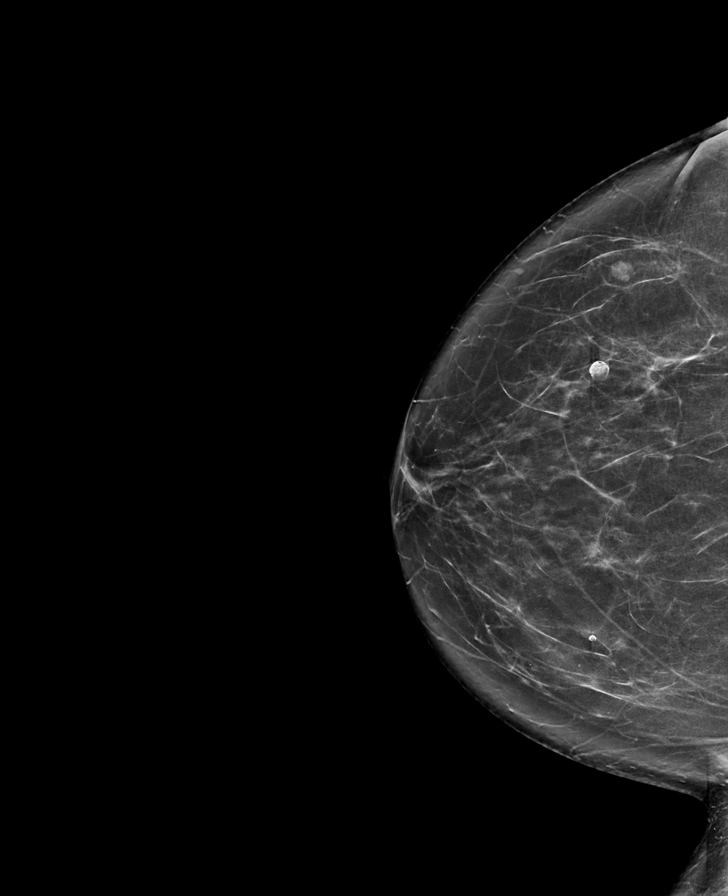

[L MLO synth-2D]
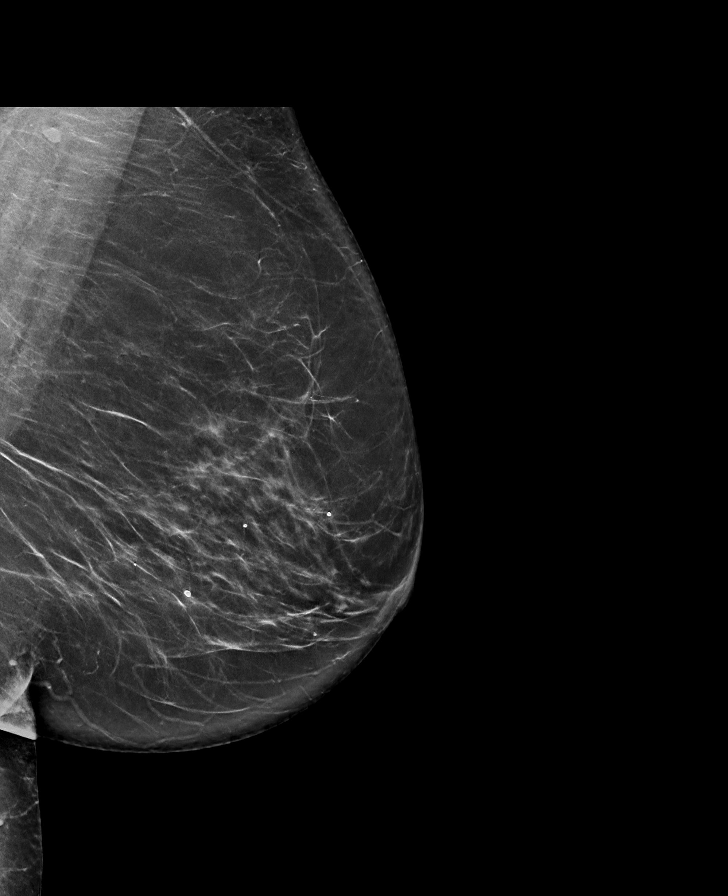

[R MLO synth-2D]
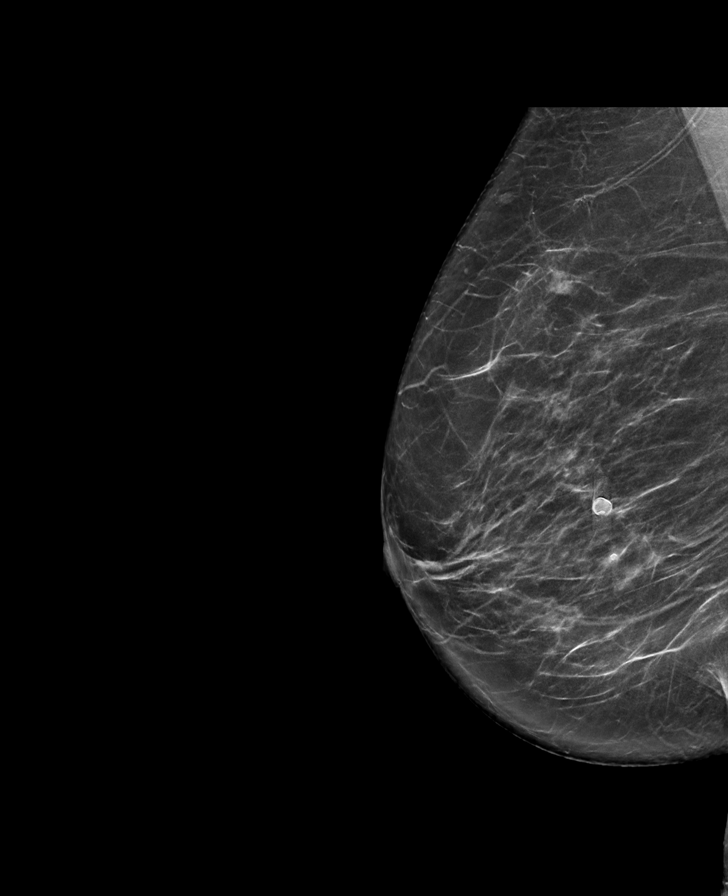

[L CC synth-2D]
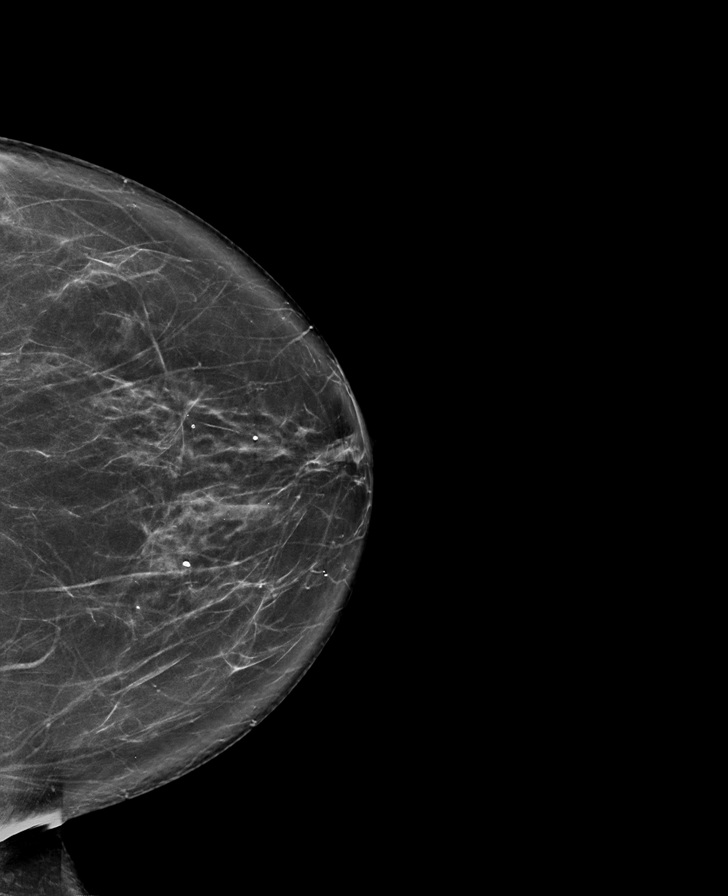

[L MLO tomo · tomo slice 42/83.0]
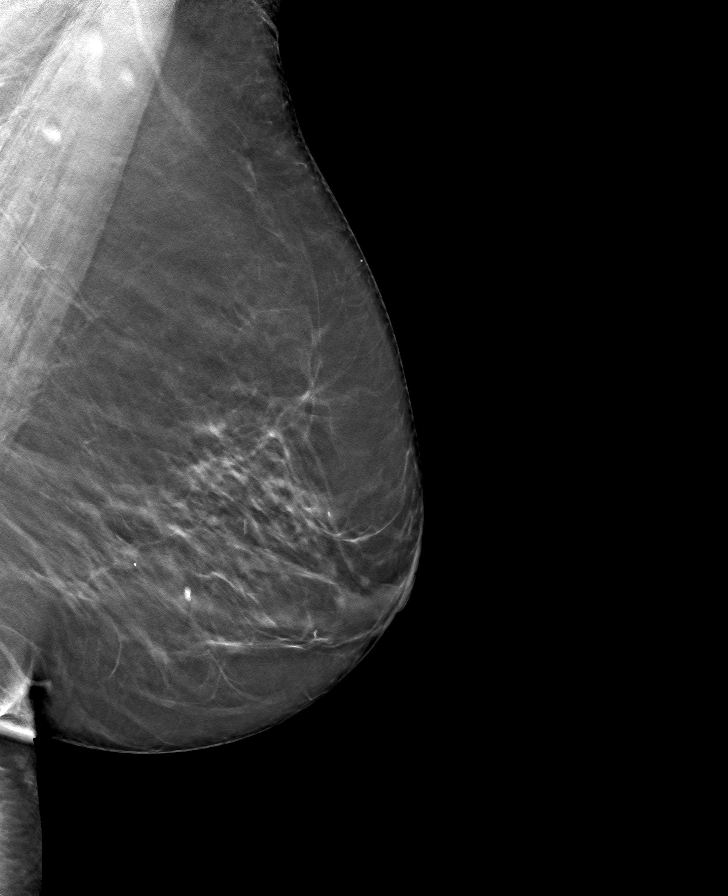

[R CC tomo · tomo slice 39/78.0]
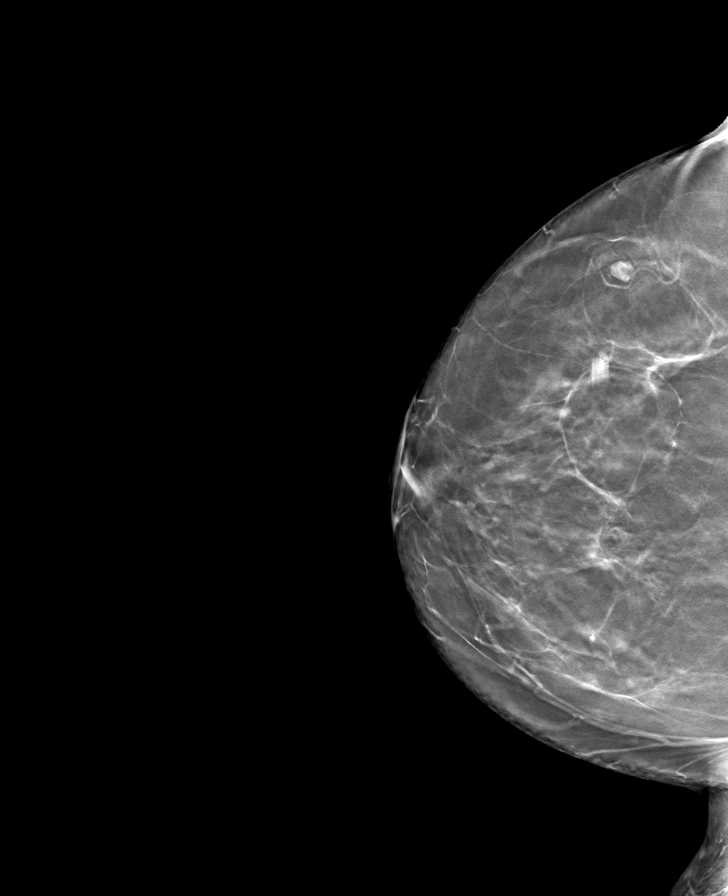

[L CC tomo · tomo slice 41/80.0]
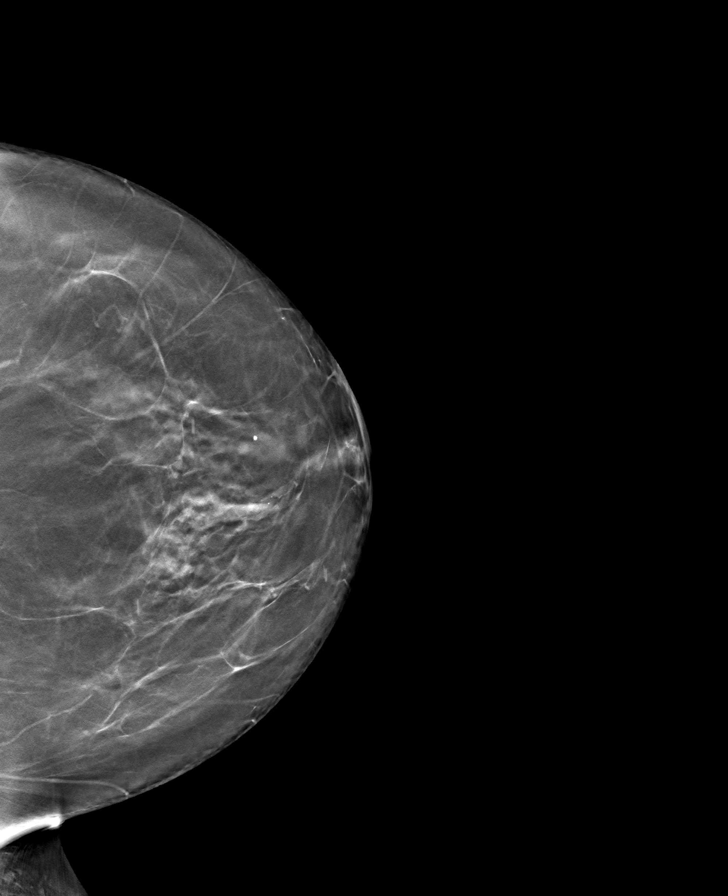

[R MLO tomo · tomo slice 39/77.0]
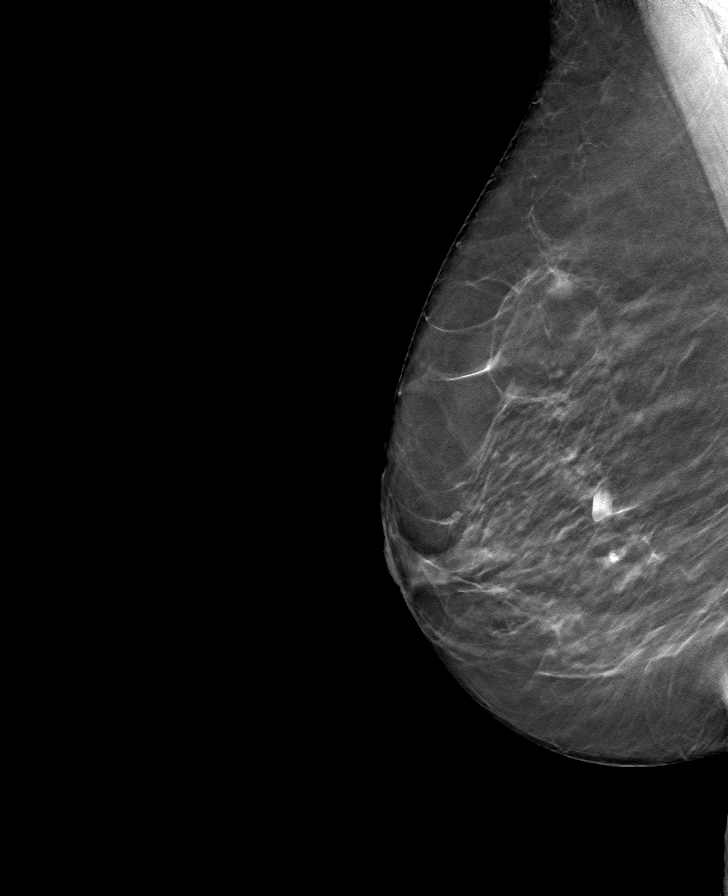

[8 of 24 positions shown; findings below may reference images not displayed]

ACR Breast Density Category b: There are scattered areas of
fibroglandular density.
FINDINGS: In the right breast, a possible asymmetry warrants further
evaluation. In the left breast, no findings suspicious for
malignancy. Images were processed with CAD.
IMPRESSION: Further evaluation is suggested for possible asymmetry in the right
breast.

RECOMMENDATION:
Diagnostic mammogram and possibly ultrasound of the right breast.
(Code:[VC])

The patient will be contacted regarding the findings, and additional
imaging will be scheduled.

BI-RADS CATEGORY  0: Incomplete. Need additional imaging evaluation
and/or prior mammograms for comparison.

## 2020-01-06 ENCOUNTER — Other Ambulatory Visit: Payer: Self-pay | Admitting: Internal Medicine

## 2020-01-06 DIAGNOSIS — R928 Other abnormal and inconclusive findings on diagnostic imaging of breast: Secondary | ICD-10-CM

## 2020-01-14 ENCOUNTER — Ambulatory Visit
Admission: RE | Admit: 2020-01-14 | Discharge: 2020-01-14 | Disposition: A | Discharge status: Home / Self Care | Payer: Medicare PPO | Source: Ambulatory Visit | Attending: Internal Medicine | Admitting: Internal Medicine

## 2020-01-14 ENCOUNTER — Ambulatory Visit: Payer: Medicare PPO

## 2020-01-14 ENCOUNTER — Other Ambulatory Visit: Payer: Self-pay

## 2020-01-14 DIAGNOSIS — R928 Other abnormal and inconclusive findings on diagnostic imaging of breast: Secondary | ICD-10-CM

## 2020-01-14 IMAGING — MG MM DIGITAL DIAGNOSTIC UNILAT*R* W/ TOMO W/ CAD
4 series · 4 of 12 positions shown · non-contrast
Comparison: Previous exam(s).

CLINICAL DATA: 74-year-old female recalled from screening mammogram
dated [DATE] for a possible right breast asymmetry.

EXAM:
DIGITAL DIAGNOSTIC UNILATERAL RIGHT MAMMOGRAM WITH TOMO AND CAD

[R CC synth-2D]
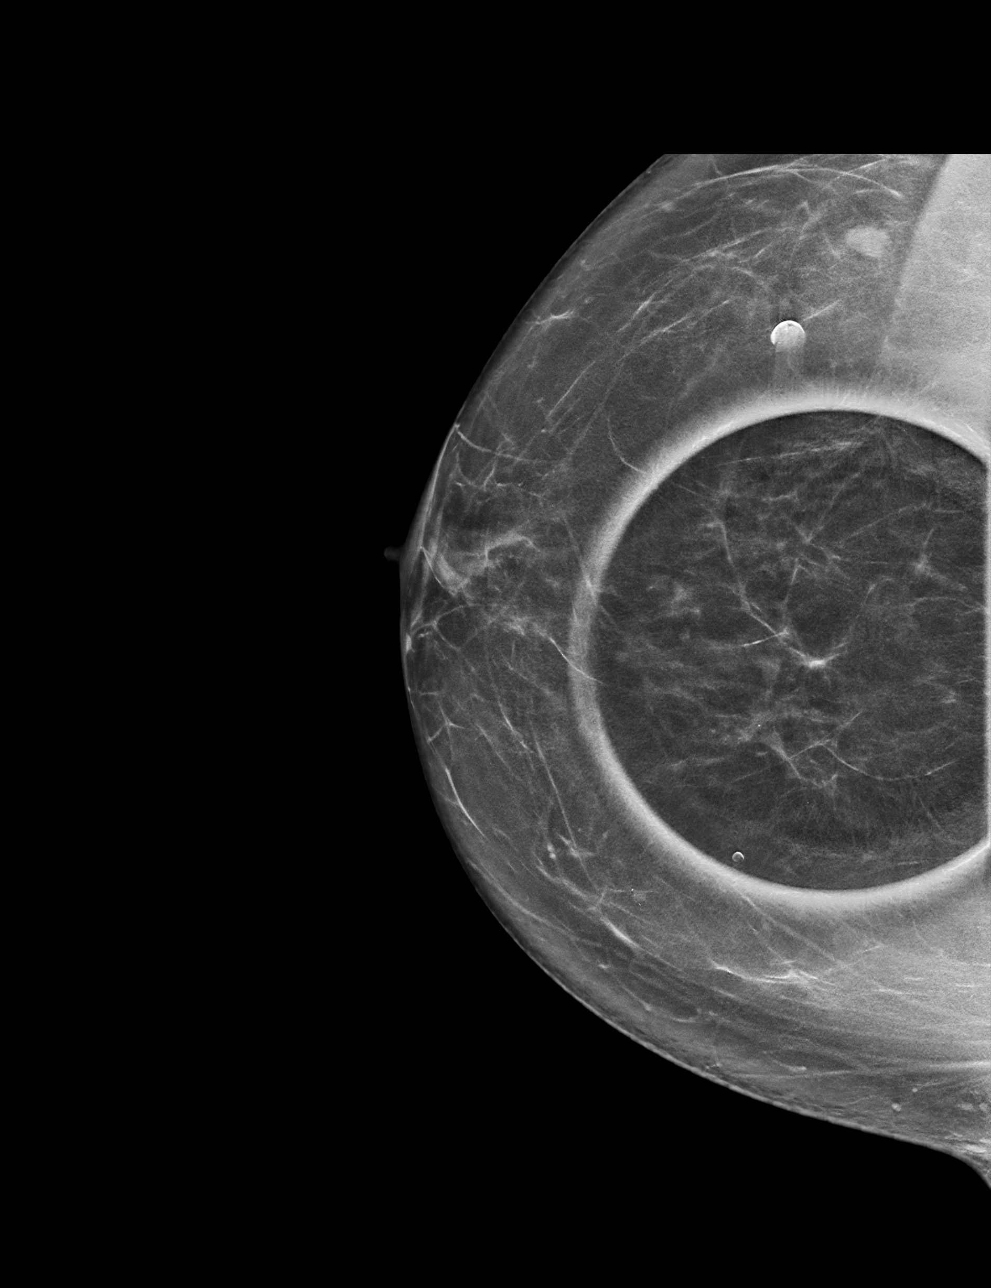

[R ML synth-2D]
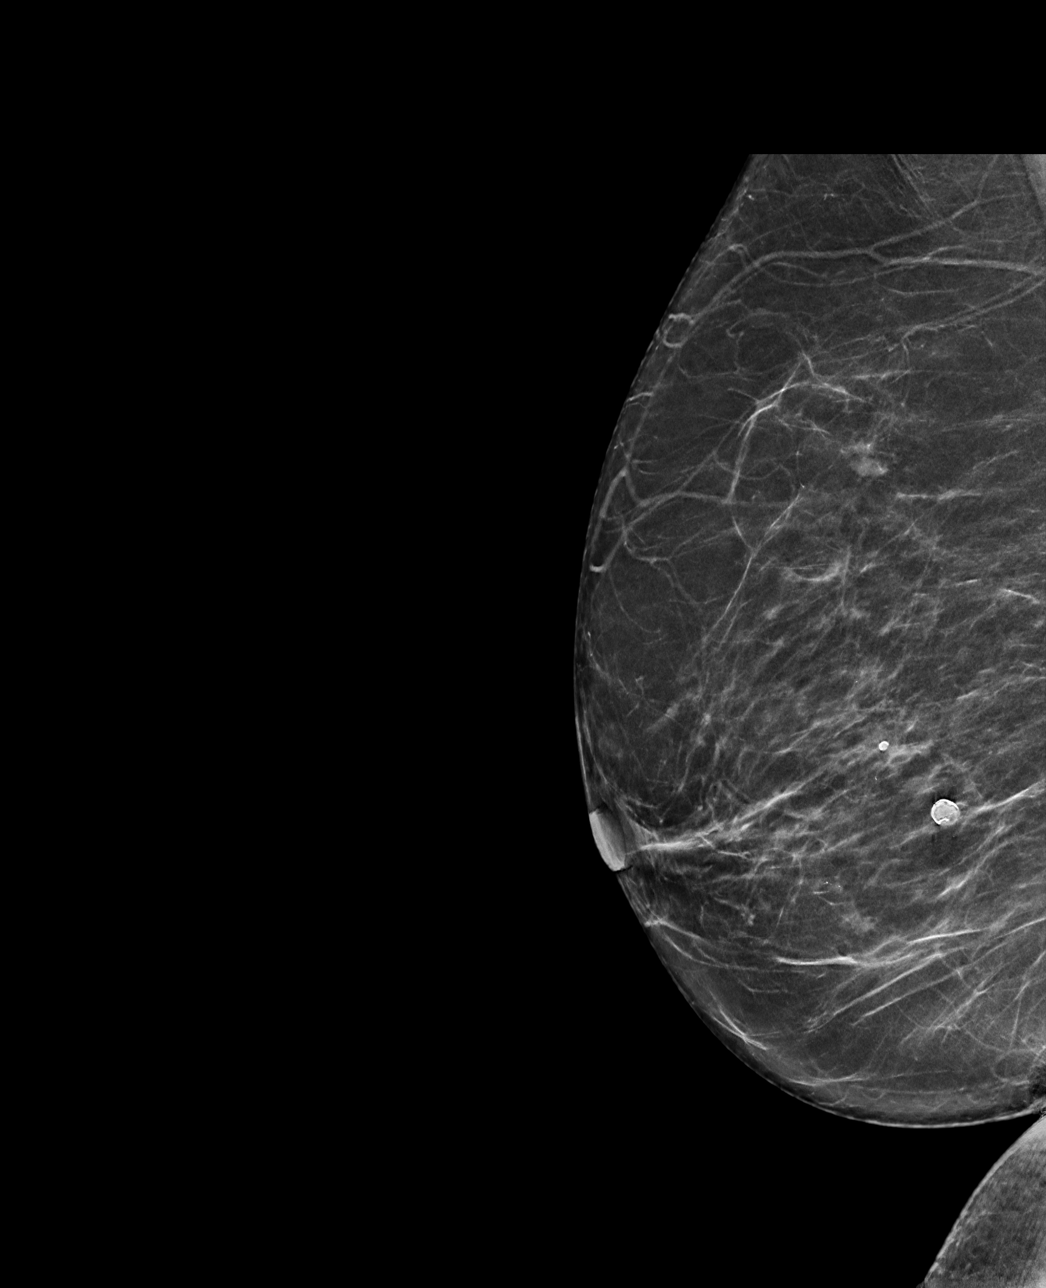

[R CC tomo · tomo slice 34/67.0]
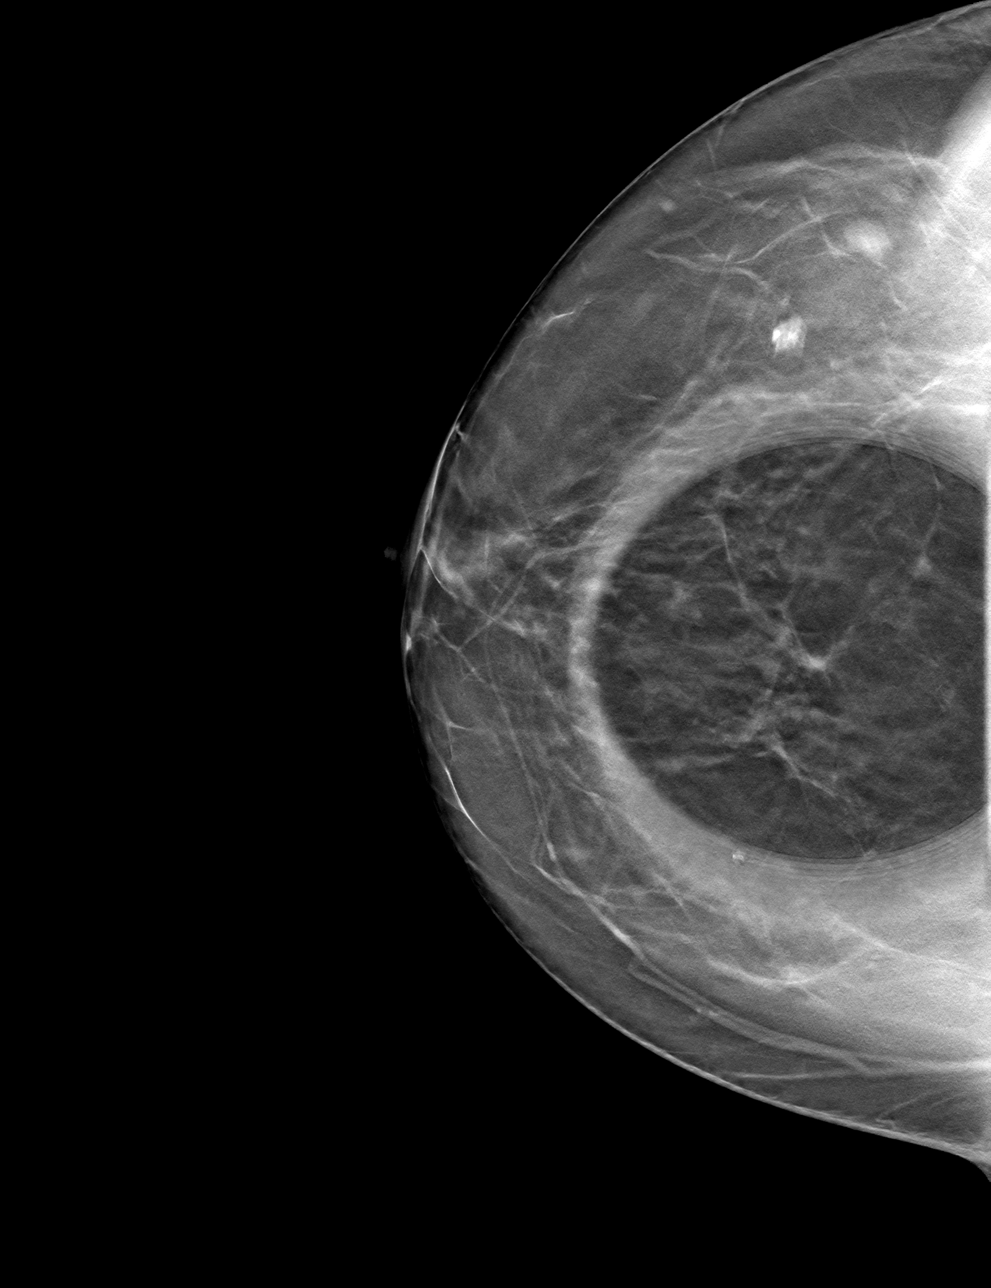

[R ML tomo · tomo slice 35/68.0]
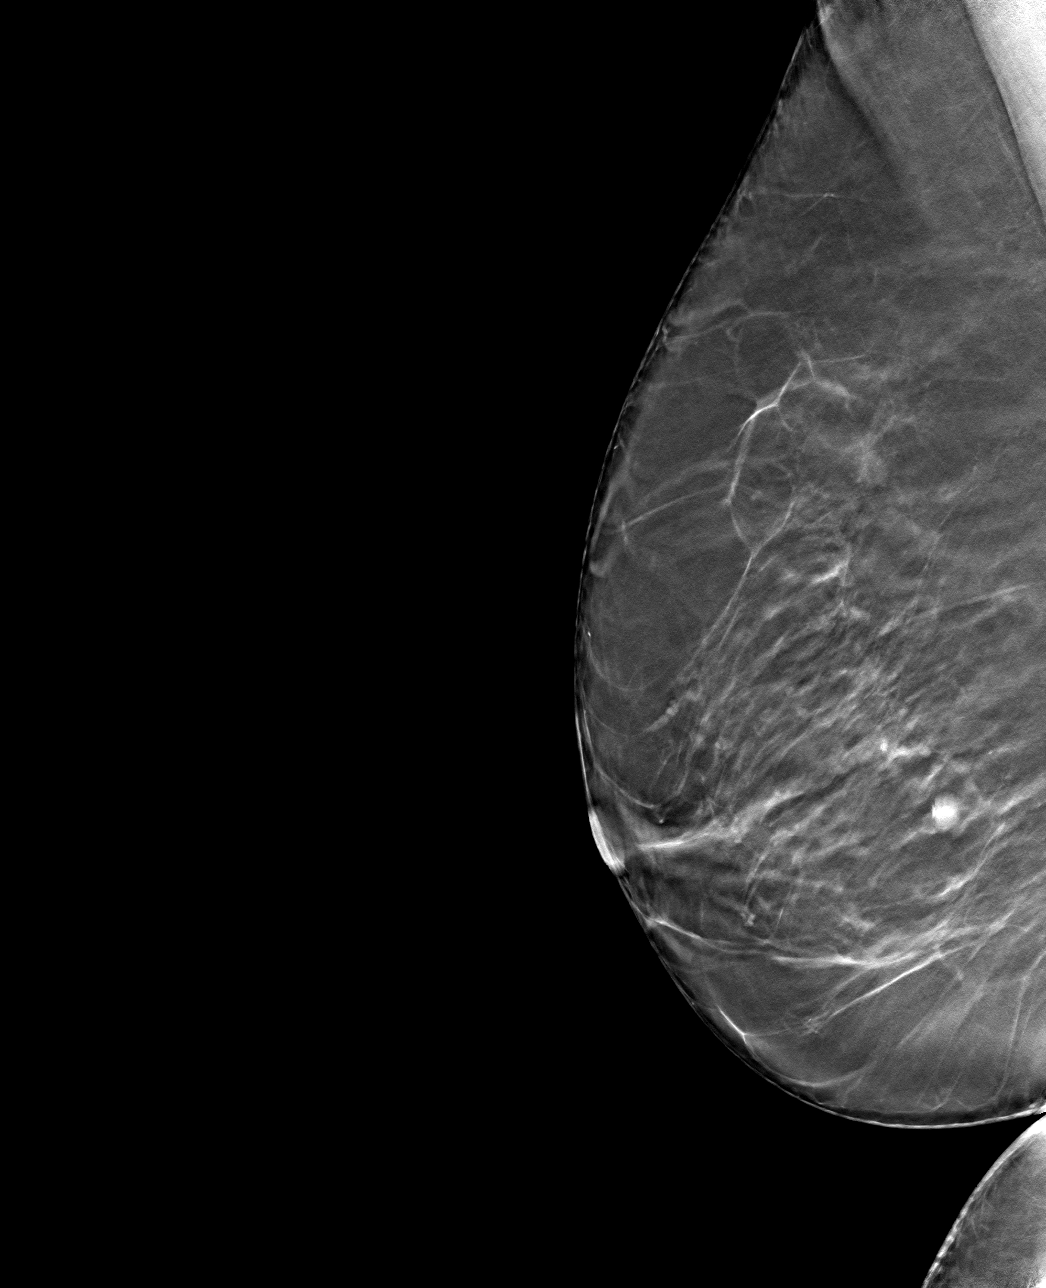

[4 of 12 positions shown; findings below may reference images not displayed]

ACR Breast Density Category b: There are scattered areas of
fibroglandular density.
FINDINGS: Previously seen, possible right breast asymmetry in the slightly
medial right breast at middle depth as seen on the CC projection
only resolves into well dispersed fibroglandular tissue on today's
additional views. No suspicious mammographic findings are
identified.

Mammographic images were processed with CAD.
IMPRESSION: No mammographic evidence of malignancy.

RECOMMENDATION:
Screening mammogram in one year.(Code:[F0])

I have discussed the findings and recommendations with the patient.
If applicable, a reminder letter will be sent to the patient
regarding the next appointment.

BI-RADS CATEGORY  1: Negative.

## 2020-02-10 ENCOUNTER — Telehealth: Payer: Self-pay

## 2020-02-10 NOTE — Telephone Encounter (Signed)
This encounter was created in error - please disregard.

## 2020-02-10 NOTE — Telephone Encounter (Signed)
   [facility] Pre-operative Risk Assessment    HEARTCARE STAFF: - Please ensure there is not already an duplicate clearance open for this procedure. - Under Visit Info/Reason for Call, type in Other and utilize the format Clearance MM/DD/YY or Clearance TBD. Do not use dashes or single digits. - If request is for dental extraction, please clarify the # of teeth to be extracted.  Request for surgical clearance:  1. What type of surgery is being performed? Robotic assisted Laparoscopic Hysterectomy with Bilateral Salpingo-oophrectomy   2. When is this surgery scheduled? March 19, 2020   3. What type of clearance is required (medical clearance vs. Pharmacy clearance to hold med vs. Both)? Medical  4. Are there any medications that need to be held prior to surgery and how long? Asprin, TBD  5. Practice name and name of physician performing surgery? Debbie [name], Dr, Debbie Louis, MD   6. What is the office phone number? [phone]   7.   What is the office fax number? [phone]  8.   Anesthesia type (None, local, MAC, general) ? None   [name] 02/10/2020, 10:19 AM  _________________________________________________________________   (provider comments below)

## 2020-02-10 NOTE — Telephone Encounter (Signed)
   Primary Cardiologist: Shelva Majestic, MD  Chart reviewed as part of pre-operative protocol coverage. Because of Debbie [name]'s past medical history and time since last visit, she will require a follow-up visit in order to better assess preoperative cardiovascular risk. She is overdue for her 6 month follow-up appointment.   Pre-op covering staff: - Please schedule appointment and call patient to inform them.  - Please contact requesting surgeon's office via preferred method (i.e, phone, fax) to inform them of need for appointment prior to surgery.  This message will also be routed to Dr. Claiborne Billings for input on holding antiplatelet therapy as requested below so that this information is available to the clearing provider at time of patient's appointment.   Abigail Butts, PA-C  02/10/2020, 3:11 PM

## 2020-02-11 NOTE — Telephone Encounter (Signed)
Pt has been notified she will need pre op appt. Pt agreeable and has been scheduled to see Coletta Memos, FNP 02/16/20 @ 8:15. Will forward note to FNP for upcoming appt. Will send FYI to requesting office pt has appt 02/16/20. Will remove from the pre op call back pool.

## 2020-02-14 NOTE — Telephone Encounter (Signed)
Ok to hold ASA for procedure 

## 2020-02-15 NOTE — Progress Notes (Signed)
Cardiology Clinic Note   Patient Name: Debbie [name] Date of Encounter: 02/16/2020  Primary Care Provider:  Sandi Mariscal, MD Primary Cardiologist:  Shelva Majestic, MD  Patient Profile    Debbie [name] 74 year old female presents the clinic today for follow-up evaluation of her paroxysmal atrial fibrillation, essential hypertension, coronary artery disease, and preoperative cardiac evaluation.  Past Medical History    Past Medical History:  Diagnosis Date  . Arthritis   . CAD (coronary artery disease) Aug 2001   RCA stent X3, patent '06. Nuc low risk 9/13  . Cataract    beginning stages  . History of stress test 01/08/2012   Showed minimal anterior thinning not felt to be significant.  Marland Kitchen Hx of echocardiogram 12/12/2011   EF>55%  . Hypercholesteremia   . Hypertension   . Myocardial infarction ([place])    53 years ago    age 49  . PAF (paroxysmal atrial fibrillation) [facility]) Aug 2013   SSS component with some bradycardia  . Stroke ([place]) 1998  . Vitamin D deficiency    Past Surgical History:  Procedure Laterality Date  . CHOLECYSTECTOMY    . COLONOSCOPY    . CORONARY ANGIOGRAM  May 2006   patent stents  . CORONARY ANGIOPLASTY WITH STENT PLACEMENT  Aug 2001   X 3    Allergies  No Known Allergies  History of Present Illness    [name] has a PMH of paroxysmal atrial fibrillation, essential hypertension, CAD status post PCA, DM 2, hyperlipidemia, CVA 1998, and bradycardia.  She was last seen by Dr. Claiborne Billings on 04/08/2019.  During that time her blood pressure was stable.  She denied chest pain.  She was noted to have swelling in bilateral ankles.  She had been compliant with her furosemide.  She denied PND, orthopnea, presyncope and syncope.  She was unaware of palpitations.  She underwent cardiac catheterization in 2006 and received stenting x3 to her RCA.  Nuclear stress test 01/19/2016 showed low risk and no ischemia with an EF of 58%.  She presents the clinic today for  follow-up evaluation states she feels well. She states she has not had any lower extremity edema since starting furosemide. She continues to be physically active at home. She states she is able to continue to do all her own housework and walk up and down stairs without exertional chest pain. She also tries to eat a low-sodium diet and is not salt to her food. She also comes in today for preoperative cardiac evaluation for total hysterectomy. She feels overall she is doing well and has no cardiac complaints at this time. I will give her the salty six diet sheet, order a BMP, fasting lipid panel, and have her follow-up in 12 months.  She denies chest pain, shortness of breath, lower extremity edema, fatigue, palpitations, melena, hematuria, hemoptysis, diaphoresis, weakness, presyncope, syncope, orthopnea, and PND.   Home Medications    Prior to Admission medications   Medication Sig Start Date End Date Taking? Authorizing Provider  amLODipine-atorvastatin (CADUET) 10-80 MG tablet Take 1 tablet by mouth in the evening 05/24/19   Troy Sine, MD  aspirin EC 81 MG tablet Take 1 tablet (81 mg total) by mouth daily. 03/01/16   Troy Sine, MD  diclofenac Sodium (VOLTAREN) 1 % GEL Apply 4 g topically 4 (four) times daily. 06/29/19   Deno Etienne, DO  ezetimibe (ZETIA) 10 MG tablet Take 1 tablet by mouth once daily 05/24/19   Shelva Majestic  A, MD  furosemide (LASIX) 20 MG tablet Take 1 tablet (20 mg total) by mouth daily. 04/08/19 04/02/20  Troy Sine, MD  hydrochlorothiazide (HYDRODIURIL) 25 MG tablet Take 1 tablet by mouth once daily 09/13/19   Troy Sine, MD  metoprolol succinate (TOPROL-XL) 50 MG 24 hr tablet TAKE 1 TABLET BY MOUTH ONCE DAILY IMMEDIATELY FOLLOWING A MEAL 05/24/19   Troy Sine, MD  Multiple Vitamins-Minerals (CENTRUM SILVER ADULT 50+) TABS Take 1 tablet by mouth daily.    [provider]  nitroGLYCERIN (NITROSTAT) 0.4 MG SL tablet Place 1 tablet (0.4 mg total) under  the tongue every 5 (five) minutes as needed for chest pain. Patient not taking: Reported on 05/08/2018 01/10/16 05/08/18  Erlene Quan, PA-C  ramipril (ALTACE) 10 MG capsule Take 1 capsule by mouth once daily 05/24/19   Troy Sine, MD    Family History    Family History  Problem Relation Age of Onset  . Diabetes type II Sister   . Heart disease Sister   . Colon cancer Neg Hx   . Colon polyps Neg Hx   . Esophageal cancer Neg Hx   . Rectal cancer Neg Hx   . Stomach cancer Neg Hx    She indicated that her mother is deceased. She indicated that her father is deceased. She indicated that her sister is deceased. She indicated that the status of her neg hx is unknown.  Social History    Social History   Socioeconomic History  . Marital status: Married    Spouse name: Gwyndolyn Saxon  . Number of children: 1  . Years of education: 87  . Highest education level: Not on file  Occupational History  . Not on file  Tobacco Use  . Smoking status: Current Some Day Smoker    Years: 60.00    Types: Cigarettes  . Smokeless tobacco: Never Used  . Tobacco comment: 2-3 cigarettes per day  Vaping Use  . Vaping Use: Never used  Substance and Sexual Activity  . Alcohol use: Not Currently  . Drug use: No  . Sexual activity: Not on file  Other Topics Concern  . Not on file  Social History Narrative   Right handed   Lives with husband   Caffeine use: Tea twice per week   Social Determinants of Health   Financial Resource Strain:   . Difficulty of Paying Living Expenses: Not on file  Food Insecurity:   . Worried About Charity fundraiser in the Last Year: Not on file  . Ran Out of Food in the Last Year: Not on file  Transportation Needs:   . Lack of Transportation (Medical): Not on file  . Lack of Transportation (Non-Medical): Not on file  Physical Activity:   . Days of Exercise per Week: Not on file  . Minutes of Exercise per Session: Not on file  Stress:   . Feeling of Stress : Not on  file  Social Connections:   . Frequency of Communication with Friends and Family: Not on file  . Frequency of Social Gatherings with Friends and Family: Not on file  . Attends Religious Services: Not on file  . Active Member of Clubs or Organizations: Not on file  . Attends Archivist Meetings: Not on file  . Marital Status: Not on file  Intimate Partner Violence:   . Fear of Current or Ex-Partner: Not on file  . Emotionally Abused: Not on file  . Physically Abused:  Not on file  . Sexually Abused: Not on file     Review of Systems    General:  No chills, fever, night sweats or weight changes.  Cardiovascular:  No chest pain, dyspnea on exertion, edema, orthopnea, palpitations, paroxysmal nocturnal dyspnea. Dermatological: No rash, lesions/masses Respiratory: No cough, dyspnea Urologic: No hematuria, dysuria Abdominal:   No nausea, vomiting, diarrhea, bright red blood per rectum, melena, or hematemesis Neurologic:  No visual changes, wkns, changes in mental status. All other systems reviewed and are otherwise negative except as noted above.  Physical Exam    VS:  BP 122/68   Pulse (!) 58   Ht 5' 5.5" (1.664 m)   Wt 201 lb 3.2 oz (91.3 kg)   SpO2 96%   BMI 32.97 kg/m  , BMI Body mass index is 32.97 kg/m. GEN: Well nourished, well developed, in no acute distress. HEENT: normal. Neck: Supple, no JVD, carotid bruits, or masses. Cardiac: RRR, no murmurs, rubs, or gallops. No clubbing, cyanosis, edema.  Radials/DP/PT 2+ and equal bilaterally.  Respiratory:  Respirations regular and unlabored, clear to auscultation bilaterally. GI: Soft, nontender, nondistended, BS + x 4. MS: no deformity or atrophy. Skin: warm and dry, no rash. Neuro:  Strength and sensation are intact. Psych: Normal affect.  Accessory Clinical Findings    Recent Labs: 04/28/2019: BUN 12; Creatinine, Ser 1.03; Hemoglobin 12.8; Platelets 325; Potassium 3.9; Sodium 140; TSH 1.410   Recent Lipid  Panel    Component Value Date/Time   CHOL 154 03/26/2018 1043   TRIG 103 03/26/2018 1043   HDL 62 03/26/2018 1043   CHOLHDL 2.5 03/26/2018 1043   CHOLHDL 2.3 01/18/2016 0823   VLDL 22 01/18/2016 0823   LDLCALC 71 03/26/2018 1043    ECG personally reviewed by me today-sinus bradycardia with sinus arrhythmia possible inferior infarct undetermined age 74 bpm- No acute changes  Echocardiogram 12/12/2011 LVEF greater than 55%, no valvular abnormalities and no pericardial effusion noted.  Nuclear stress test 01/19/2016  The left ventricular ejection fraction is normal (55-65%).  There was no ST segment deviation noted during stress.  The study is normal.  This is a low risk study.   Normal resting and stress perfusion. No ischemia or infarction EF 58%      Assessment & Plan   1.  Coronary artery disease-no chest pain today.  Underwent cardiac catheterization 2006 and received stents x3 to her RCA.  Underwent nuclear stress test 01/19/2016 which showed low risk, no ischemia and an EF of 58%. Continue amlodipine-atorvastatin, Zetia, metoprolol, nitroglycerin Heart healthy low-sodium diet-salty 6 given Increase physical activity as tolerated  Essential hypertension-BP today 122/68.  Well-controlled at home Continue amlodipine-atorvastatin, metoprolol, ramipril Heart healthy low-sodium diet-salty 6 given Increase physical activity as tolerated Order BMP  Hyperlipidemia-LDL 71 on 03/26/2018 Continue amlodipine-atorvastatin, Zetia Heart healthy low-sodium high-fiber diet Increase physical activity as tolerated Repeat fasting lipid panel    Preoperative cardiac evaluation   Primary Cardiologist: Shelva Majestic, MD  Chart reviewed as part of pre-operative protocol coverage. Given past medical history and time since last visit, based on ACC/AHA guidelines, Anvitha Hutmacher would be at acceptable risk for the planned procedure without further cardiovascular testing.   Her RCRI  is a class III risk, 6.6% risk of major cardiac event.  She is able to complete greater than 4 METS of physical activity.  Patient was advised that if she develops new symptoms prior to surgery to contact our office to arrange a follow-up appointment.  She  verbalized understanding.  I will route this recommendation to the requesting party via Epic fax function and remove from pre-op pool.  Please call with questions.   Disposition: Follow-up with Dr. Claiborne Billings or me in 12 months.  Jossie Ng. Tashiana Lamarca NP-C    02/16/2020, 8:28 AM [place] [address] Office [phone] Fax [phone]  Notice: This dictation was prepared with Dragon dictation along with smaller phrase technology. Any transcriptional errors that result from this process are unintentional and may not be corrected upon review.

## 2020-02-16 ENCOUNTER — Ambulatory Visit: Payer: Medicare PPO | Admitting: General Practice

## 2020-02-16 ENCOUNTER — Other Ambulatory Visit: Payer: Self-pay

## 2020-02-16 ENCOUNTER — Encounter: Payer: Self-pay | Admitting: General Practice

## 2020-02-16 VITALS — BP 122/68 | HR 58 | Ht 65.5 in | Wt 201.2 lb

## 2020-02-16 DIAGNOSIS — I2510 Atherosclerotic heart disease of native coronary artery without angina pectoris: Secondary | ICD-10-CM | POA: Diagnosis not present

## 2020-02-16 DIAGNOSIS — E785 Hyperlipidemia, unspecified: Secondary | ICD-10-CM | POA: Diagnosis not present

## 2020-02-16 DIAGNOSIS — I10 Essential (primary) hypertension: Secondary | ICD-10-CM | POA: Diagnosis not present

## 2020-02-16 DIAGNOSIS — Z9861 Coronary angioplasty status: Secondary | ICD-10-CM

## 2020-02-16 DIAGNOSIS — Z01810 Encounter for preprocedural cardiovascular examination: Secondary | ICD-10-CM | POA: Diagnosis not present

## 2020-02-16 DIAGNOSIS — Z79899 Other long term (current) drug therapy: Secondary | ICD-10-CM

## 2020-02-16 LAB — BASIC METABOLIC PANEL
BUN/Creatinine Ratio: 12 (ref 12–28)
BUN: 12 mg/dL (ref 8–27)
CO2: 24 mmol/L (ref 20–29)
Calcium: 9.8 mg/dL (ref 8.7–10.3)
Chloride: 104 mmol/L (ref 96–106)
Creatinine, Ser: 0.97 mg/dL (ref 0.57–1.00)
GFR calc Af Amer: 66 mL/min/{1.73_m2} (ref >59)
GFR calc non Af Amer: 57 mL/min/{1.73_m2} — ABNORMAL LOW (ref >59)
Glucose: 177 mg/dL — ABNORMAL HIGH (ref 65–99)
Potassium: 3.4 mmol/L — ABNORMAL LOW (ref 3.5–5.2)
Sodium: 142 mmol/L (ref 134–144)

## 2020-02-16 LAB — LIPID PANEL
Chol/HDL Ratio: 2.6 ratio (ref 0.0–4.4)
Cholesterol, Total: 141 mg/dL (ref 100–199)
HDL: 54 mg/dL (ref >39)
LDL Chol Calc (NIH): 68 mg/dL (ref 0–99)
Triglycerides: 104 mg/dL (ref 0–149)
VLDL Cholesterol Cal: 19 mg/dL (ref 5–40)

## 2020-02-16 NOTE — Patient Instructions (Signed)
Medication Instructions:  The current medical regimen is effective;  continue present plan and medications as directed. Please refer to the Current Medication list given to you today.,  *If you need a refill on your cardiac medications before your next appointment, please call your pharmacy*  Lab Work: BMET AND LIPID TODAY HERE IN OUR OFFICE If you have labs (blood work) drawn today and your tests are completely normal, you will receive your results only by:  [place] (if you have MyChart) OR A paper copy in the mail.  If you have any lab test that is abnormal or we need to change your treatment, we will call you to review the results. You may go to any [facility] that is convenient for you however, we do have a lab in our office that is able to assist you. You DO NOT need an appointment for our lab. The lab is open 8:00am and closes at 4:00pm. Lunch 12:45 - 1:45pm.  Special Instructions PLEASE READ AND FOLLOW SALTY 6-ATTACHED  PLEASE INCREASE PHYSICAL ACTIVITY AS TOLERATED  Follow-Up: Your next appointment:  12 month(s) In Person with You may see Shelva Majestic, MD OR IF UNAVAILABLE JESSE CLEAVER, FNP-C or one of the following Advanced Practice Providers on your designated Care Team:  Almyra Deforest, PA-C  Fabian Sharp, [place] or Roby Lofts, PA-C  Please call our office 2 months in advance to schedule this appointment   At [facility], you and your health needs are our priority.  As part of our continuing mission to provide you with exceptional heart care, we have created designated Provider Care Teams.  These Care Teams include your primary Cardiologist (physician) and Advanced Practice Providers (APPs -  Physician Assistants and Nurse Practitioners) who all work together to provide you with the care you need, when you need it.

## 2020-03-17 ENCOUNTER — Other Ambulatory Visit: Payer: Self-pay | Admitting: Cardiovascular Disease

## 2020-03-24 NOTE — Progress Notes (Signed)
Your procedure is scheduled on Wednesday, December 15th.  Report to [facility] Main Entrance "A" at 8:00 A.M., and check in at the Admitting office.  Call this number if you have problems the morning of surgery:  [phone]  Call [phone] if you have any questions prior to your surgery date Monday-Friday 8am-4pm   Remember:  Do not eat or drink after midnight the night before your surgery    Take these medicines the morning of surgery with A SIP OF WATER  ezetimibe (ZETIA)  metoprolol succinate (TOPROL-XL)  If needed: nitroGLYCERIN (NITROSTAT), oxymetazoline (AFRIN)/nasal spray  Follow your surgeon's instructions on when to stop Aspirin.  If no instructions were given by your surgeon then you will need to call the office to get those instructions.    As of today, STOP taking any Aspirin (unless otherwise instructed by your surgeon) Aleve, Naproxen, Ibuprofen, Motrin, Advil, Goody's, BC's, all herbal medications, fish oil, and all vitamins.             Do not wear jewelry, make up, or nail polish            Do not wear lotions, powders, perfumes or deodorant.            Do not shave 48 hours prior to surgery.              Do not bring valuables to the hospital.            [facility] is not responsible for any belongings or valuables.  Do NOT Smoke (Tobacco/Vaping) or drink Alcohol 24 hours prior to your procedure If you use a CPAP at night, you may bring all equipment for your overnight stay.   Contacts, glasses, dentures or bridgework may not be worn into surgery.      For patients admitted to the hospital, discharge time will be determined by your treatment team.   Patients discharged the day of surgery will not be allowed to drive home, and someone needs to stay with them for 24 hours.  Special instructions:   - Preparing For Surgery  Before surgery, you can play an important role. Because skin is not sterile, your skin needs to be as free of germs as  possible. You can reduce the number of germs on your skin by washing with CHG (chlorahexidine gluconate) Soap before surgery.  CHG is an antiseptic cleaner which kills germs and bonds with the skin to continue killing germs even after washing.    Oral Hygiene is also important to reduce your risk of infection.  Remember - BRUSH YOUR TEETH THE MORNING OF SURGERY WITH YOUR REGULAR TOOTHPASTE  Please do not use if you have an allergy to CHG or antibacterial soaps. If your skin becomes reddened/irritated stop using the CHG.  Do not shave (including legs and underarms) for at least 48 hours prior to first CHG shower. It is OK to shave your face.  Please follow these instructions carefully.   1. Shower the NIGHT BEFORE SURGERY and the MORNING OF SURGERY with CHG Soap.   2. If you chose to wash your hair, wash your hair first as usual with your normal shampoo.  3. After you shampoo, rinse your hair and body thoroughly to remove the shampoo.  4. Use CHG as you would any other liquid soap. You can apply CHG directly to the skin and wash gently with a scrungie or a clean washcloth.   5. Apply the CHG Soap to your body  ONLY FROM THE NECK DOWN.  Do not use on open wounds or open sores. Avoid contact with your eyes, ears, mouth and genitals (private parts). Wash Face and genitals (private parts)  with your normal soap.   6. Wash thoroughly, paying special attention to the area where your surgery will be performed.  7. Thoroughly rinse your body with warm water from the neck down.  8. DO NOT shower/wash with your normal soap after using and rinsing off the CHG Soap.  9. Pat yourself dry with a CLEAN TOWEL.  10. Wear CLEAN PAJAMAS to bed the night before surgery  11. Place CLEAN SHEETS on your bed the night of your first shower and DO NOT SLEEP WITH PETS.  Day of Surgery: Wear Clean/Comfortable clothing the morning of surgery Do not apply any deodorants/lotions.   Remember to brush your teeth WITH  YOUR REGULAR TOOTHPASTE.   Please read over the following fact sheets that you were given.

## 2020-03-27 ENCOUNTER — Encounter (HOSPITAL_COMMUNITY)
Admission: RE | Admit: 2020-03-27 | Discharge: 2020-03-27 | Disposition: A | Discharge status: Home / Self Care | Payer: Medicare PPO | Source: Ambulatory Visit | Attending: Obstetrics and Gynecology | Admitting: Obstetrics and Gynecology

## 2020-03-27 ENCOUNTER — Other Ambulatory Visit (HOSPITAL_COMMUNITY)
Admission: RE | Admit: 2020-03-27 | Discharge: 2020-03-27 | Disposition: A | Discharge status: Home / Self Care | Payer: Medicare PPO | Source: Ambulatory Visit | Attending: Obstetrics and Gynecology | Admitting: Obstetrics and Gynecology

## 2020-03-27 ENCOUNTER — Other Ambulatory Visit: Payer: Self-pay

## 2020-03-27 ENCOUNTER — Encounter (HOSPITAL_COMMUNITY): Payer: Self-pay

## 2020-03-27 DIAGNOSIS — Z01812 Encounter for preprocedural laboratory examination: Secondary | ICD-10-CM | POA: Insufficient documentation

## 2020-03-27 DIAGNOSIS — Z20822 Contact with and (suspected) exposure to covid-19: Secondary | ICD-10-CM | POA: Insufficient documentation

## 2020-03-27 LAB — BASIC METABOLIC PANEL
Anion gap: 13 (ref 5–15)
BUN: 9 mg/dL (ref 8–23)
CO2: 22 mmol/L (ref 22–32)
Calcium: 9.6 mg/dL (ref 8.9–10.3)
Chloride: 105 mmol/L (ref 98–111)
Creatinine, Ser: 0.96 mg/dL (ref 0.44–1.00)
GFR, Estimated: >60 mL/min (ref >60)
Glucose, Bld: 192 mg/dL — ABNORMAL HIGH (ref 70–99)
Potassium: 3.3 mmol/L — ABNORMAL LOW (ref 3.5–5.1)
Sodium: 140 mmol/L (ref 135–145)

## 2020-03-27 LAB — CBC
HCT: 40.6 % (ref 36.0–46.0)
Hemoglobin: 13.3 g/dL (ref 12.0–15.0)
MCH: 29.7 pg (ref 26.0–34.0)
MCHC: 32.8 g/dL (ref 30.0–36.0)
MCV: 90.6 fL (ref 80.0–100.0)
Platelets: 305 10*3/uL (ref 150–400)
RBC: 4.48 MIL/uL (ref 3.87–5.11)
RDW: 14.1 % (ref 11.5–15.5)
WBC: 8.2 10*3/uL (ref 4.0–10.5)
nRBC: 0 % (ref 0.0–0.2)

## 2020-03-27 LAB — TYPE AND SCREEN
ABO/RH(D): B POS
Antibody Screen: NEGATIVE

## 2020-03-27 LAB — SARS CORONAVIRUS 2 (TAT 6-24 HRS): SARS Coronavirus 2: NEGATIVE

## 2020-03-27 NOTE — Progress Notes (Signed)
PCP: Dr. Nancy Fetter Cardiologist: Dr. Kelly--02/16/20 clearance note from Coletta Memos NP  EKG: 02/16/2020 CXR: n/a ECHO: 2013 Stress Test: 2017 Cardiac Cath: 2006  ASA: Note from Dr. Claiborne Billings on 02/14/20: "OK to hold ASA". Pt states she's already stopped ASA.  Going for Covid testing today after PAT appt. Aware to self quarentine.  No orders, surgeon sent IB message.  Patient denies shortness of breath, fever, cough, and chest pain at PAT appointment.  Patient verbalized understanding of instructions provided today at the PAT appointment.  Patient asked to review instructions at home and day of surgery.

## 2020-03-28 ENCOUNTER — Other Ambulatory Visit: Payer: Self-pay | Admitting: Obstetrics and Gynecology

## 2020-03-28 NOTE — H&P (Signed)
Subjective:    Chief Complaint(s):      pre op- Ovarian cyst       HPI:          Isolation Precautions          Has patient received COVID-19 vaccination?  Yes- Moderna.  Does patient report new onset of COVID symptoms?  No.  Has patient or close contact tested positive for COVID-19?  No , not in the past 2 weeks.         General          74 y/o presents for pre op visit            She is scheduled for robotic assisted laparoscopic hysterectomy with bilateral salpingo-oophorectomy secondary to ovarian cyst on 03/29/2020. She desires full hysterectomy instead of just removal of ovaries.             On u/s 01/17/2020, revealed uterus 6.5 x 2.7 x 4.0cm. 2 fibroids notes largest 1.8cm. Right ovary normal. left adnexa shows mass that measures 4.5cm, it is avascular. Area in left ovary has increased from previous u/s on 08/10/2019.            Pt. states that she does not experience pain in the left side unless she is trying to move or lift heavy things like furniture.             She mentions that she had a stroke about 4-5 years ago.             She also mentions that she has previously had a heart attack, she is under the care of a cardiologist.            She has no concerns or complaints in office today.            Pt. denies experiencing any vaginal discharge that has an odor or itches or burns.            Upon pelvic examination cervix appears normal, exam overall normal.     Current Medication:      Taking   amLODIPine-Atorvastatin 10-80 MG Tablet 1 tablet Orally in the evening.      Centrum Silver 50+Women - Tablet as directed Orally.      Ezetimibe 10 MG Tablet 1 tablet Orally Once a day.      Furosemide 20 MG Tablet 1 tablet Orally Once a day.      Metoprolol Succinate 50 MG Capsule ER 24 Hour Sprinkle 1 capsule Orally Once a day.      Nitroglycerin 0.4 MG Tablet Sublingual as directed Sublingual.      Ramipril 10 MG Capsule 1 capsule Orally Once a day.          Discontinued   Aspir-81.      Medication List reviewed and reconciled with the patient.      Medical History:   hypertension      cva ( stroke) over 25 years ago      MI over 20 years ago .. cardiologist is Dr. Claiborne Billings she has 3 stents in her arteries       Allergies/Intolerance:      N.K.D.A.    Gyn History:   Sexual activity currently sexually active, not really.   Periods : 25 years ago.   Denies LMP.   Birth control BTL at 74 years old?.   Last pap smear date 3 years ago.   Last mammogram date 01/13/2020.   Denies Abnormal pap  smear.   Denies STD.        OB History:   Number of pregnancies  1.   Pregnancy # 1  vaginal delivery.        Surgical History:   gall bladder removal at [facility]      tubal ligation       Hospitalization:   Denies Past Hospitalization       Family History:   Mother: deceased         Mother had ovarian cancer        Sister - Diabetic        Pt had a stroke 23 years ago        Pt's family with heart disease.     Social History:       General         Tobacco use cigarettes:  current some day smoker one a week, Tobacco history last updated  03/20/2020, Vaping  No.           no Alcohol, no.           Caffeine: yes, coke, once every 2 weeks.           no Recreational drug use.           Marital Status: married.      ROS:       CONSTITUTIONAL         Chills  No.  Fatigue  No.  Fever  No.  Night sweats  No.  Recent travel outside [place]  No.  Sweats  No.  Weight change  No.         OPHTHALMOLOGY         Blurring of vision  no.  Change in vision  no.  Double vision  no.         ENT         Dizziness  no.  Nose bleeds  no.  Sore throat  no.  Teeth pain  no.         ALLERGY         Hives  no.         CARDIOLOGY         Chest pain  no.  High blood pressure  no.  Irregular heart beat  no.  Leg edema  no.  Palpitations  no.         RESPIRATORY         Shortness of breath  no.  Cough  YES.  Wheezing  yes.         UROLOGY         Pain with  urination  no.  Urinary urgency  no.  Urinary frequency  no.  Urinary incontinence  yes.  Difficulty urinating  No.  Blood in urine  No.         GASTROENTEROLOGY         Abdominal pain  no.  Appetite change  no.  Bloating/belching  no.  Blood in stool or on toilet paper  no.  Change in bowel movements  no.  Constipation  no.  Diarrhea  no.  Difficulty swallowing  no.  Nausea  no.         FEMALE REPRODUCTIVE         Vulvar pain  no.  Vulvar rash  no.  Abnormal vaginal bleeding  no.  Breast pain  no.  Nipple discharge  no.  Pain with intercourse  no.  Pelvic pain  no.  Unusual vaginal discharge  no.  Vaginal itching  no.         MUSCULOSKELETAL         Muscle aches  no.         NEUROLOGY         Headache  no.  Tingling/numbness  no.  Weakness  no.         PSYCHOLOGY         Depression  no.  Anxiety  no.  Nervousness  no.  Sleep disturbances  no.  Suicidal ideation  no .         SKIN         Bruises easily  YES.         ENDOCRINOLOGY         Excessive thirst  no.  Excessive urination  no.  Hair loss  no.  Heat or cold intolerance  no.         HEMATOLOGY/LYMPH         Abnormal bleeding  no.  Easy bruising  no.  Swollen glands  no.         DERMATOLOGY         New/changing skin lesion  no.  Rash  no.  Sores  no.            Negative except as stated in HPI.   Objective:    Vitals:        Wt 201, Wt change -3 lb, Ht 66, BMI 32.44, Pulse sitting 54, BP sitting 140/72.     Past Results:    Examination:          General Examination         CONSTITUTIONAL: alert, oriented, NAD.          SKIN:  moist, warm.          EYES:  Conjunctiva clear.          LUNGS: clear to auscultation and percussion.          HEART: heart sounds are normal, rhythm is regular, no murmur.          ABDOMEN: soft, non-tender/non-distended, bowel sounds present.          FEMALE GENITOURINARY: normal external genitalia, labia - unremarkable, vagina - pink moist mucosa, no lesions or abnormal discharge, cervix -  no discharge or lesions or CMT, adnexa - no masses or tenderness, uterus - nontender and normal size on palpation.          PSYCH:  affect normal, good eye contact.      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 Pt. desires full hysterectomy, not just removal of ovaries. Pt. advised no eating or drinking night prior to surgery. Pt. advised to avoid aspirin, aleve, ibuprofen, and motrin between now and surgery. Discussed with pt. there is a risk of infection, bleeding, damage to bowel, bladder, ureters and surround organs with the need for further surgery. Discussed risks associated with blood transfusion. If blood is needed for life saving measure pt. wishes to receive. Pt. advised she will stay in hospital for one night. Pt. advised no driving  for 1 week following surgery. Pt. advised no heavy lifiting or sexual intercourse for 6-8 weeks following surgery.

## 2020-03-28 NOTE — Progress Notes (Signed)
Anesthesia Chart Review:  Follows with cardiology for hx of paroxysmal atrial fibrillation, essential hypertension, CAD (s/p stent x 3 to RCA in 2006), hyperlipidemia, CVA 1998, and bradycardia. Nuclear stress test 01/19/2016 showed low risk and no ischemia with an EF of 58%. Last seen 02/16/20, stable at that time from cardiac standpoint, cleared for upcoming surgery. Per note, "Chart reviewed as part of pre-operative protocol coverage. Given past medical history and time since last visit, based on ACC/AHA guidelines, Debbie [name] would be at acceptable risk for the planned procedure without further cardiovascular testing. Her RCRI is a class III risk, 6.6% risk of major cardiac event.  She is able to complete greater than 4 METS of physical activity."   Diet controlled DMII. Hgb A1c MFr Bld (%)  Date Value  04/28/2019 7.2 (H)  01/09/2015 7.1 (H)  12/30/2012 7.0 (H)    Preop labs reviewed, unremarkable.   EKG 02/16/20: Sinus bradycardia. Rate 58. Possible inferior infarct, age undetermined.   Nuclear stress test 01/19/2016  The left ventricular ejection fraction is normal (55-65%).  There was no ST segment deviation noted during stress.  The study is normal.  This is a low risk study.  Normal resting and stress perfusion. No ischemia or infarction EF 58%   Debbie [name] [facility]/Anesthesiology Phone [phone] 03/28/2020 10:00 AM

## 2020-03-28 NOTE — Anesthesia Preprocedure Evaluation (Addendum)
Anesthesia Evaluation  Patient identified by MRN, date of birth, ID band Patient awake    Reviewed: Allergy & Precautions, NPO status , Patient's Chart, lab work & pertinent test results, reviewed documented beta blocker date and time   History of Anesthesia Complications Negative for: history of anesthetic complications  Airway Mallampati: II  TM Distance: >3 FB Neck ROM: Full    Dental  (+) Edentulous Upper, Edentulous Lower, Dental Advisory Given   Pulmonary Current Smoker and Patient abstained from smoking.,    Pulmonary exam normal        Cardiovascular Exercise Tolerance: Good hypertension, Pt. on medications and Pt. on home beta blockers + CAD, + Past MI and + Cardiac Stents (2006)  Normal cardiovascular exam+ dysrhythmias Atrial Fibrillation      Neuro/Psych CVA (1998) negative psych ROS   GI/Hepatic negative GI ROS, Neg liver ROS,   Endo/Other  diabetes, Type 2  Renal/GU negative Renal ROS  negative genitourinary   Musculoskeletal negative musculoskeletal ROS (+)   Abdominal   Peds  Hematology negative hematology ROS (+)   Anesthesia Other Findings  Nuclear stress test 01/19/2016 showed low risk and no ischemia with an EF of 58%. Last seen 02/16/20, stable at that time from cardiac standpoint, cleared for upcoming surgery. Per note, "Chart reviewed as part of pre-operative protocol coverage. Given past medical history and time since last visit, based on ACC/AHA guidelines, Debbie [name] would be at acceptable risk for the planned procedure without further cardiovascular testing. Her RCRI is a class III risk, 6.6% risk of major cardiac event.  She is able to complete greater than 4 METS of physical activity."  Reproductive/Obstetrics                           Anesthesia Physical Anesthesia Plan  ASA: III  Anesthesia Plan: General   Post-op Pain Management:    Induction:  Intravenous  PONV Risk Score and Plan: 3 and Ondansetron, Dexamethasone, Treatment may vary due to age or medical condition and Midazolam  Airway Management Planned: Oral ETT  Additional Equipment: None  Intra-op Plan:   Post-operative Plan: Extubation in OR  Informed Consent: I have reviewed the patients History and Physical, chart, labs and discussed the procedure including the risks, benefits and alternatives for the proposed anesthesia with the patient or authorized representative who has indicated his/her understanding and acceptance.     Dental advisory given  Plan Discussed with:   Anesthesia Plan Comments: (PAT note by Karoline Caldwell, PA-C: Follows with cardiology for hx of paroxysmal atrial fibrillation, essential hypertension, CAD (s/p stent x 3 to RCA in 2006), hyperlipidemia, CVA 1998, and bradycardia. Nuclear stress test 01/19/2016 showed low risk and no ischemia with an EF of 58%. Last seen 02/16/20, stable at that time from cardiac standpoint, cleared for upcoming surgery. Per note, "Chart reviewed as part of pre-operative protocol coverage. Given past medical history and time since last visit, based on ACC/AHA guidelines,Debbie Dean Masonwould be at acceptable risk for the planned procedure without further cardiovascular testing. Her RCRI is a class III risk, 6.6% risk of major cardiac event. She is able to complete greater than 4 METS of physical activity."   Diet controlled DMII. Last Labs Hgb A1c MFr Bld (%) Date Value 04/28/2019 7.2 (H) 01/09/2015 7.1 (H) 12/30/2012 7.0 (H)    Preop labs reviewed, unremarkable.   EKG 02/16/20: Sinus bradycardia. Rate 58. Possible inferior infarct, age undetermined.   Nuclear stress  test 01/19/2016  The left ventricular ejection fraction is normal (55-65%).  There was no ST segment deviation noted during stress.  The study is normal.  This is a low risk study.  Normal resting and stress perfusion. No ischemia or  infarction EF 58%   )       Anesthesia Quick Evaluation

## 2020-03-29 ENCOUNTER — Observation Stay (HOSPITAL_COMMUNITY)
Admission: RE | Admit: 2020-03-29 | Discharge: 2020-03-30 | Disposition: A | Discharge status: Home / Self Care | Payer: Medicare PPO | Attending: Obstetrics and Gynecology | Admitting: Obstetrics and Gynecology

## 2020-03-29 ENCOUNTER — Observation Stay (HOSPITAL_COMMUNITY): Payer: Medicare PPO | Admitting: Physician Assistant

## 2020-03-29 ENCOUNTER — Observation Stay (HOSPITAL_COMMUNITY): Payer: Medicare PPO | Admitting: Certified Registered"

## 2020-03-29 ENCOUNTER — Encounter (HOSPITAL_COMMUNITY)
Admission: RE | Disposition: A | Discharge status: Home / Self Care | Payer: Self-pay | Source: Home / Self Care | Attending: Obstetrics and Gynecology

## 2020-03-29 ENCOUNTER — Encounter (HOSPITAL_COMMUNITY): Payer: Self-pay | Admitting: Obstetrics and Gynecology

## 2020-03-29 ENCOUNTER — Other Ambulatory Visit: Payer: Self-pay

## 2020-03-29 DIAGNOSIS — D271 Benign neoplasm of left ovary: Secondary | ICD-10-CM | POA: Insufficient documentation

## 2020-03-29 DIAGNOSIS — Z79899 Other long term (current) drug therapy: Secondary | ICD-10-CM | POA: Insufficient documentation

## 2020-03-29 DIAGNOSIS — N72 Inflammatory disease of cervix uteri: Secondary | ICD-10-CM | POA: Insufficient documentation

## 2020-03-29 DIAGNOSIS — D259 Leiomyoma of uterus, unspecified: Principal | ICD-10-CM | POA: Insufficient documentation

## 2020-03-29 DIAGNOSIS — N838 Other noninflammatory disorders of ovary, fallopian tube and broad ligament: Secondary | ICD-10-CM | POA: Diagnosis not present

## 2020-03-29 DIAGNOSIS — Z7982 Long term (current) use of aspirin: Secondary | ICD-10-CM | POA: Insufficient documentation

## 2020-03-29 DIAGNOSIS — N83291 Other ovarian cyst, right side: Secondary | ICD-10-CM | POA: Insufficient documentation

## 2020-03-29 DIAGNOSIS — N839 Noninflammatory disorder of ovary, fallopian tube and broad ligament, unspecified: Secondary | ICD-10-CM | POA: Diagnosis present

## 2020-03-29 DIAGNOSIS — Z90710 Acquired absence of both cervix and uterus: Secondary | ICD-10-CM | POA: Diagnosis present

## 2020-03-29 DIAGNOSIS — N888 Other specified noninflammatory disorders of cervix uteri: Secondary | ICD-10-CM | POA: Insufficient documentation

## 2020-03-29 HISTORY — PX: ROBOTIC ASSISTED TOTAL HYSTERECTOMY WITH BILATERAL SALPINGO OOPHERECTOMY: SHX6086

## 2020-03-29 HISTORY — DX: Type 2 diabetes mellitus without complications: E11.9

## 2020-03-29 LAB — GLUCOSE, CAPILLARY
Glucose-Capillary: 141 mg/dL — ABNORMAL HIGH (ref 70–99)
Glucose-Capillary: 156 mg/dL — ABNORMAL HIGH (ref 70–99)

## 2020-03-29 LAB — ABO/RH: ABO/RH(D): B POS

## 2020-03-29 SURGERY — HYSTERECTOMY, TOTAL, ROBOT-ASSISTED, LAPAROSCOPIC, WITH BILATERAL SALPINGO-OOPHORECTOMY
Anesthesia: General | Site: Abdomen | Laterality: Bilateral

## 2020-03-29 MED ORDER — FENTANYL CITRATE (PF) 250 MCG/5ML IJ SOLN
INTRAMUSCULAR | Status: AC
  Filled 2020-03-29: qty 5

## 2020-03-29 MED ORDER — PROPOFOL 10 MG/ML IV BOLUS
INTRAVENOUS | Status: AC
  Filled 2020-03-29: qty 20

## 2020-03-29 MED ORDER — OXYCODONE HCL 5 MG/5ML PO SOLN: 5.0000 mg | Freq: Once | ORAL | Status: DC | PRN

## 2020-03-29 MED ORDER — EPHEDRINE SULFATE-NACL 50-0.9 MG/10ML-% IV SOSY
PREFILLED_SYRINGE | INTRAVENOUS | Status: DC | PRN
  Administered 2020-03-29: 5 mg via INTRAVENOUS
  Administered 2020-03-29: 10 mg via INTRAVENOUS

## 2020-03-29 MED ORDER — ROPIVACAINE HCL 5 MG/ML IJ SOLN
INTRAMUSCULAR | Status: AC
  Filled 2020-03-29: qty 60

## 2020-03-29 MED ORDER — LACTATED RINGERS IV SOLN: INTRAVENOUS | Status: DC

## 2020-03-29 MED ORDER — SODIUM CHLORIDE 0.9 % IV SOLN
2.0000 g | INTRAVENOUS | Status: AC
  Administered 2020-03-29: 2 g via INTRAVENOUS
  Filled 2020-03-29: qty 2

## 2020-03-29 MED ORDER — AMISULPRIDE (ANTIEMETIC) 5 MG/2ML IV SOLN: 10.0000 mg | Freq: Once | INTRAVENOUS | Status: DC | PRN

## 2020-03-29 MED ORDER — SODIUM CHLORIDE (PF) 0.9 % IJ SOLN
INTRAMUSCULAR | Status: AC
  Filled 2020-03-29: qty 50

## 2020-03-29 MED ORDER — POVIDONE-IODINE 10 % EX SWAB: 2.0000 "application " | Freq: Once | CUTANEOUS | Status: DC

## 2020-03-29 MED ORDER — ACETAMINOPHEN 500 MG PO TABS
1000.0000 mg | ORAL_TABLET | Freq: Four times a day (QID) | ORAL | Status: DC
  Administered 2020-03-29 – 2020-03-30 (×3): 1000 mg via ORAL
  Filled 2020-03-29 (×3): qty 2

## 2020-03-29 MED ORDER — IBUPROFEN 800 MG PO TABS
800.0000 mg | ORAL_TABLET | Freq: Three times a day (TID) | ORAL | Status: DC
  Administered 2020-03-29: 800 mg via ORAL
  Filled 2020-03-29 (×2): qty 1

## 2020-03-29 MED ORDER — ONDANSETRON HCL 4 MG PO TABS
4.0000 mg | ORAL_TABLET | Freq: Four times a day (QID) | ORAL | Status: DC | PRN
  Administered 2020-03-29: 4 mg via ORAL
  Filled 2020-03-29: qty 1

## 2020-03-29 MED ORDER — ATORVASTATIN CALCIUM 80 MG PO TABS
80.0000 mg | ORAL_TABLET | Freq: Every evening | ORAL | Status: DC
  Administered 2020-03-29: 80 mg via ORAL
  Filled 2020-03-29 (×2): qty 1

## 2020-03-29 MED ORDER — EPHEDRINE 5 MG/ML INJ
INTRAVENOUS | Status: AC
  Filled 2020-03-29: qty 10

## 2020-03-29 MED ORDER — OXYCODONE HCL 5 MG PO TABS: 5.0000 mg | ORAL_TABLET | ORAL | Status: DC | PRN

## 2020-03-29 MED ORDER — ORAL CARE MOUTH RINSE: 15.0000 mL | Freq: Once | OROMUCOSAL | Status: AC

## 2020-03-29 MED ORDER — LIDOCAINE 2% (20 MG/ML) 5 ML SYRINGE
INTRAMUSCULAR | Status: AC
  Filled 2020-03-29: qty 5

## 2020-03-29 MED ORDER — FENTANYL CITRATE (PF) 250 MCG/5ML IJ SOLN
INTRAMUSCULAR | Status: DC | PRN
  Administered 2020-03-29: 50 ug via INTRAVENOUS
  Administered 2020-03-29: 100 ug via INTRAVENOUS

## 2020-03-29 MED ORDER — ALUM & MAG HYDROXIDE-SIMETH 200-200-20 MG/5ML PO SUSP: 30.0000 mL | ORAL | Status: DC | PRN

## 2020-03-29 MED ORDER — PROPOFOL 10 MG/ML IV BOLUS
INTRAVENOUS | Status: DC | PRN
  Administered 2020-03-29: 120 mg via INTRAVENOUS

## 2020-03-29 MED ORDER — AMLODIPINE-ATORVASTATIN 10-80 MG PO TABS: 1.0000 | ORAL_TABLET | Freq: Every evening | ORAL | Status: DC

## 2020-03-29 MED ORDER — ACETAMINOPHEN 500 MG PO TABS
1000.0000 mg | ORAL_TABLET | ORAL | Status: AC
  Administered 2020-03-29: 1000 mg via ORAL
  Filled 2020-03-29: qty 2

## 2020-03-29 MED ORDER — ONDANSETRON HCL 4 MG/2ML IJ SOLN
INTRAMUSCULAR | Status: DC | PRN
  Administered 2020-03-29: 4 mg via INTRAVENOUS

## 2020-03-29 MED ORDER — HYDROMORPHONE HCL 1 MG/ML IJ SOLN: 0.2000 mg | INTRAMUSCULAR | Status: DC | PRN

## 2020-03-29 MED ORDER — CELECOXIB 200 MG PO CAPS
400.0000 mg | ORAL_CAPSULE | ORAL | Status: AC
  Administered 2020-03-29: 400 mg via ORAL
  Filled 2020-03-29: qty 2

## 2020-03-29 MED ORDER — SENNA 8.6 MG PO TABS
1.0000 | ORAL_TABLET | Freq: Two times a day (BID) | ORAL | Status: DC
  Administered 2020-03-29 – 2020-03-30 (×2): 8.6 mg via ORAL
  Filled 2020-03-29 (×2): qty 1

## 2020-03-29 MED ORDER — GLYCOPYRROLATE PF 0.2 MG/ML IJ SOSY
PREFILLED_SYRINGE | INTRAMUSCULAR | Status: DC | PRN
  Administered 2020-03-29 (×2): .1 mg via INTRAVENOUS

## 2020-03-29 MED ORDER — ONDANSETRON HCL 4 MG/2ML IJ SOLN: 4.0000 mg | Freq: Once | INTRAMUSCULAR | Status: DC | PRN

## 2020-03-29 MED ORDER — SODIUM CHLORIDE 0.9 % IV SOLN
INTRAVENOUS | Status: DC | PRN
  Administered 2020-03-29: 45 mL

## 2020-03-29 MED ORDER — NITROGLYCERIN 0.4 MG SL SUBL: 0.4000 mg | SUBLINGUAL_TABLET | SUBLINGUAL | Status: DC | PRN

## 2020-03-29 MED ORDER — PHENYLEPHRINE 40 MCG/ML (10ML) SYRINGE FOR IV PUSH (FOR BLOOD PRESSURE SUPPORT)
PREFILLED_SYRINGE | INTRAVENOUS | Status: AC
  Filled 2020-03-29: qty 10

## 2020-03-29 MED ORDER — MIDAZOLAM HCL 2 MG/2ML IJ SOLN
INTRAMUSCULAR | Status: AC
  Filled 2020-03-29: qty 2

## 2020-03-29 MED ORDER — RAMIPRIL 10 MG PO CAPS
10.0000 mg | ORAL_CAPSULE | Freq: Every day | ORAL | Status: DC
  Administered 2020-03-29 – 2020-03-30 (×2): 10 mg via ORAL
  Filled 2020-03-29 (×2): qty 1

## 2020-03-29 MED ORDER — AMLODIPINE BESYLATE 10 MG PO TABS
10.0000 mg | ORAL_TABLET | Freq: Every evening | ORAL | Status: DC
  Administered 2020-03-29: 10 mg via ORAL
  Filled 2020-03-29: qty 1

## 2020-03-29 MED ORDER — ONDANSETRON HCL 4 MG/2ML IJ SOLN
INTRAMUSCULAR | Status: AC
  Filled 2020-03-29: qty 2

## 2020-03-29 MED ORDER — LACTATED RINGERS IV SOLN: INTRAVENOUS | Status: DC | PRN

## 2020-03-29 MED ORDER — CHLORHEXIDINE GLUCONATE 0.12 % MT SOLN
15.0000 mL | Freq: Once | OROMUCOSAL | Status: AC
  Administered 2020-03-29: 15 mL via OROMUCOSAL
  Filled 2020-03-29: qty 15

## 2020-03-29 MED ORDER — SODIUM CHLORIDE FLUSH 0.9 % IV SOLN
INTRAVENOUS | Status: AC
  Filled 2020-03-29: qty 10

## 2020-03-29 MED ORDER — LIDOCAINE 2% (20 MG/ML) 5 ML SYRINGE
INTRAMUSCULAR | Status: DC | PRN
  Administered 2020-03-29: 60 mg via INTRAVENOUS

## 2020-03-29 MED ORDER — SODIUM CHLORIDE 0.9 % IR SOLN
Status: DC | PRN
  Administered 2020-03-29: 3000 mL

## 2020-03-29 MED ORDER — DEXAMETHASONE SODIUM PHOSPHATE 10 MG/ML IJ SOLN
INTRAMUSCULAR | Status: DC | PRN
  Administered 2020-03-29: 10 mg via INTRAVENOUS

## 2020-03-29 MED ORDER — SUGAMMADEX SODIUM 200 MG/2ML IV SOLN
INTRAVENOUS | Status: DC | PRN
  Administered 2020-03-29: 200 mg via INTRAVENOUS

## 2020-03-29 MED ORDER — DEXAMETHASONE SODIUM PHOSPHATE 10 MG/ML IJ SOLN
INTRAMUSCULAR | Status: AC
  Filled 2020-03-29: qty 1

## 2020-03-29 MED ORDER — METOPROLOL SUCCINATE ER 50 MG PO TB24
50.0000 mg | ORAL_TABLET | Freq: Every day | ORAL | Status: DC
  Administered 2020-03-30: 50 mg via ORAL
  Filled 2020-03-29: qty 1

## 2020-03-29 MED ORDER — MIDAZOLAM HCL 5 MG/5ML IJ SOLN
INTRAMUSCULAR | Status: DC | PRN
  Administered 2020-03-29: 1 mg via INTRAVENOUS

## 2020-03-29 MED ORDER — OXYCODONE HCL 5 MG PO TABS: 5.0000 mg | ORAL_TABLET | Freq: Once | ORAL | Status: DC | PRN

## 2020-03-29 MED ORDER — ROCURONIUM BROMIDE 10 MG/ML (PF) SYRINGE
PREFILLED_SYRINGE | INTRAVENOUS | Status: AC
  Filled 2020-03-29: qty 10

## 2020-03-29 MED ORDER — FENTANYL CITRATE (PF) 100 MCG/2ML IJ SOLN
25.0000 ug | INTRAMUSCULAR | Status: DC | PRN
Start: 2020-03-29 — End: 2020-03-29

## 2020-03-29 MED ORDER — MENTHOL 3 MG MT LOZG: 1.0000 | LOZENGE | OROMUCOSAL | Status: DC | PRN

## 2020-03-29 MED ORDER — ROCURONIUM BROMIDE 10 MG/ML (PF) SYRINGE
PREFILLED_SYRINGE | INTRAVENOUS | Status: DC | PRN
  Administered 2020-03-29: 50 mg via INTRAVENOUS
  Administered 2020-03-29: 20 mg via INTRAVENOUS

## 2020-03-29 MED ORDER — SIMETHICONE 80 MG PO CHEW
80.0000 mg | CHEWABLE_TABLET | Freq: Four times a day (QID) | ORAL | Status: DC | PRN
  Administered 2020-03-29: 80 mg via ORAL
  Filled 2020-03-29: qty 1

## 2020-03-29 MED ORDER — ONDANSETRON HCL 4 MG/2ML IJ SOLN: 4.0000 mg | Freq: Four times a day (QID) | INTRAMUSCULAR | Status: DC | PRN

## 2020-03-29 SURGICAL SUPPLY — 66 items
APPLICATOR ARISTA FLEXITIP XL (MISCELLANEOUS) IMPLANT
BARRIER ADHS 3X4 INTERCEED (GAUZE/BANDAGES/DRESSINGS) IMPLANT
CATH FOLEY 3WAY  5CC 16FR (CATHETERS) ×2
CATH FOLEY 3WAY 5CC 16FR (CATHETERS) ×1 IMPLANT
CELLS DAT CNTRL 66122 CELL SVR (MISCELLANEOUS) ×1 IMPLANT
COVER BACK TABLE 60X90IN (DRAPES) ×2 IMPLANT
COVER TIP SHEARS 8 DVNC (MISCELLANEOUS) ×1 IMPLANT
COVER TIP SHEARS 8MM DA VINCI (MISCELLANEOUS) ×2
DECANTER SPIKE VIAL GLASS SM (MISCELLANEOUS) ×4 IMPLANT
DEFOGGER ANTIFOG KIT (MISCELLANEOUS) ×2 IMPLANT
DEFOGGER SCOPE WARMER CLEARIFY (MISCELLANEOUS) ×2 IMPLANT
DERMABOND ADVANCED (GAUZE/BANDAGES/DRESSINGS) ×1
DERMABOND ADVANCED .7 DNX12 (GAUZE/BANDAGES/DRESSINGS) ×1 IMPLANT
DILATOR CANAL MILEX (MISCELLANEOUS) ×2 IMPLANT
DRAPE ARM DVNC X/XI (DISPOSABLE) ×4 IMPLANT
DRAPE COLUMN DVNC XI (DISPOSABLE) ×1 IMPLANT
DRAPE DA VINCI XI ARM (DISPOSABLE) ×8
DRAPE DA VINCI XI COLUMN (DISPOSABLE) ×2
DRAPE UTILITY 15X26 TOWEL STRL (DRAPES) ×2 IMPLANT
DRAPE UTILITY XL STRL (DRAPES) ×2 IMPLANT
DURAPREP 26ML APPLICATOR (WOUND CARE) ×2 IMPLANT
ELECT REM PT RETURN 9FT ADLT (ELECTROSURGICAL) ×2
ELECTRODE REM PT RTRN 9FT ADLT (ELECTROSURGICAL) ×1 IMPLANT
GAUZE 4X4 16PLY RFD (DISPOSABLE) ×2 IMPLANT
GLOVE BIOGEL M 7.0 STRL (GLOVE) ×6 IMPLANT
GLOVE BIOGEL PI IND STRL 7.0 (GLOVE) ×6 IMPLANT
GLOVE BIOGEL PI INDICATOR 7.0 (GLOVE) ×6
HEMOSTAT ARISTA ABSORB 3G PWDR (HEMOSTASIS) IMPLANT
HIBICLENS CHG 4% 4OZ BTL (MISCELLANEOUS) ×2 IMPLANT
IRRIGATION STRYKERFLOW (MISCELLANEOUS) ×1 IMPLANT
IRRIGATOR STRYKERFLOW (MISCELLANEOUS) ×2
LEGGING LITHOTOMY PAIR STRL (DRAPES) ×2 IMPLANT
OBTURATOR OPTICAL STANDARD 8MM (TROCAR)
OBTURATOR OPTICAL STND 8 DVNC (TROCAR)
OBTURATOR OPTICALSTD 8 DVNC (TROCAR) IMPLANT
OCCLUDER COLPOPNEUMO (BALLOONS) ×2 IMPLANT
PACK ROBOT WH (CUSTOM PROCEDURE TRAY) ×2 IMPLANT
PACK ROBOTIC GOWN (GOWN DISPOSABLE) ×2 IMPLANT
PACK TRENDGUARD 450 HYBRID PRO (MISCELLANEOUS) IMPLANT
PAD OB MATERNITY 4.3X12.25 (PERSONAL CARE ITEMS) ×2 IMPLANT
PROTECTOR NERVE ULNAR (MISCELLANEOUS) ×2 IMPLANT
RTRCTR WOUND ALEXIS 18CM MED (MISCELLANEOUS) ×2
SCISSORS LAP 5X45 EPIX DISP (ENDOMECHANICALS) IMPLANT
SEAL CANN UNIV 5-8 DVNC XI (MISCELLANEOUS) ×5 IMPLANT
SEAL XI 5MM-8MM UNIVERSAL (MISCELLANEOUS) ×10
SEALER VESSEL DA VINCI XI (MISCELLANEOUS) ×2
SEALER VESSEL EXT DVNC XI (MISCELLANEOUS) ×1 IMPLANT
SET IRRIG Y TYPE TUR BLADDER L (SET/KITS/TRAYS/PACK) IMPLANT
SET TRI-LUMEN FLTR TB AIRSEAL (TUBING) IMPLANT
SUT VIC AB 0 CT1 27 (SUTURE) ×4
SUT VIC AB 0 CT1 27XBRD ANBCTR (SUTURE) ×2 IMPLANT
SUT VICRYL 0 UR6 27IN ABS (SUTURE) IMPLANT
SUT VICRYL RAPIDE 4/0 PS 2 (SUTURE) ×6 IMPLANT
SUT VLOC 180 0 9IN  GS21 (SUTURE) ×2
SUT VLOC 180 0 9IN GS21 (SUTURE) ×1 IMPLANT
SYR 50ML LL SCALE MARK (SYRINGE) ×2 IMPLANT
TIP RUMI ORANGE 6.7MMX12CM (TIP) IMPLANT
TIP UTERINE 5.1X6CM LAV DISP (MISCELLANEOUS) ×2 IMPLANT
TIP UTERINE 6.7X10CM GRN DISP (MISCELLANEOUS) IMPLANT
TIP UTERINE 6.7X6CM WHT DISP (MISCELLANEOUS) IMPLANT
TIP UTERINE 6.7X8CM BLUE DISP (MISCELLANEOUS) IMPLANT
TOWEL GREEN STERILE (TOWEL DISPOSABLE) ×2 IMPLANT
TRENDGUARD 450 HYBRID PRO PACK (MISCELLANEOUS)
TROCAR PORT AIRSEAL 8X120 (TROCAR) ×2 IMPLANT
UNDERPAD 30X36 HEAVY ABSORB (UNDERPADS AND DIAPERS) ×2 IMPLANT
WATER STERILE IRR 1000ML POUR (IV SOLUTION) ×2 IMPLANT

## 2020-03-29 NOTE — Op Note (Signed)
03/29/2020  2:52 PM  PATIENT:  Debbie [name]  74 y.o. female  PRE-OPERATIVE DIAGNOSIS:  N94.9 adnexal mass  POST-OPERATIVE DIAGNOSIS:  N94.9 adnexal mass  PROCEDURE:  Procedure(s) with comments: XI ROBOTIC ASSISTED TOTAL HYSTERECTOMY WITH BILATERAL SALPINGO OOPHORECTOMY (Bilateral) - Tracie to RNFA confirmed on 02/16/20 CS  SURGEON:  Surgeon(s) and Role:    Christophe Louis, MD - Primary  PHYSICIAN ASSISTANT: None ASSISTANTS: Gaylord Shih RNFA   ANESTHESIA:   general  EBL:  30 mL   BLOOD ADMINISTERED:none  DRAINS: Urinary Catheter (Foley)   LOCAL MEDICATIONS USED:  OTHER Ropivacaine  SPECIMEN:  Source of Specimen:  Uterus cervix bilateral fallopian tubes and ovaries   DISPOSITION OF SPECIMEN:  PATHOLOGY  COUNTS:  YES  TOURNIQUET:  * No tourniquets in log *  DICTATION: .Dragon Dictation  PLAN OF CARE: Admit for overnight observation  PATIENT DISPOSITION:  PACU - hemodynamically stable.   Delay start of Pharmacological VTE agent (>24 hrs) due to surgical blood loss or risk of bleeding: not applicable  Findings: Normal appearing uterus and fallopian tubes.. Left ovary with complex appearing cyst 4 cm in size.. normal appearing right ovary.   Procedure: The patient was taken to operating room #10  where she was placed under general anesthesia.Time out was performed. Marland Kitchen She was placed in dorsal lithotomy position and prepped and draped in the usual sterile fashion. A weighted speculum was placed into the vagina. A Deaver was placed anteriorly for retraction. The anterior lip of the cervix was grasped with a single-tooth tenaculum. The vaginal mucosa was injected with 2.5 cc of ropivacaine at the 2/4/ 8 and 10 o'clock positions. The uterus was sounded to 7 cm. the cervix was dilated to 6 mm . 0 vicryl suture placed at the 12 position Of the cervix to facilitate placement of a Ru mi uterine manipulator. The manipulator was placed without difficulty. Weighted speculum and Deaver  were removed .  Attention was turned to the patient's abdomen where a 8 mm trocar was placed at the umbilicus. under direct visualization . The pneumoperitoneum was achieved with PCO2 gas. The laparoscope was removed. 60 cc of ropivacaine were injected into the abdominal cavity. The laparoscope was reinserted. An 8 mm trocar was placed in the right upper quadrant 16 centimeters from the umbilicus.later connected to robotic arm #4). An 8 MM incision was made in the Right upper quadrant TROCAR WAS PLACED 8 cm from the umbilicus. Later connected to robotic arm #3. An 8 mm incision was made in the left upper quadrant 16 cm from the umbilicus and connected to robot arm #1. Marland Kitchen Attention was turned to the left upper quadrant where a 8 mm midclavicular assistant trocar was placed. ( All incision sites were injected with 10 cc of ropivacaine prior to port placement. )  Once all ports had been placed under direct visualization.The laparoscope was removed and the [place] robotic system was thin right-sided docked. The robotic arms were connected to the corresponding trocars as listed above. The laparoscope was then reinserted. The long tip bipolar forceps were placed into port #1. The pro-grasp  placed in the port #4. A vessel sealer ( alternating with scissors) was placed in port #3. All instruments were directed into the pelvis under direct visualization.  Attention was turned to the surgeons console.. The left infundibulo-pelvic ligament was cauterized and transected with the vessel sealer The broad ligament was cauterized and transected with the vessel sealer .The round ligament was cauterized and transected  with the vessel sealer  The anterior leaf of broad ligament was incised along the bladder reflection to the midline.  The right infundibulo-pelvic  ligament was cauterized and transected with the vessel sealer. The right broad ligament was cauterized and transected with the vessel sealer. The right round ligament  was cauterized and transected with the vessel sealer The broad ligament was incised to the midline. The bladder was dissected off the lower uterine segments of the cervix via sharp and blunt dissection.   The uterine arteries were skeleton bilaterally. They were  cauterized and transected with the vessel sealer The KOH ring was identified. The anterior colpotomy was performed followed by the posterior colpotomy. Once the uterus,cervix and bilateral fallopian tubes were completely excised they were  removed through the vagina. The  bipolar forceps and scissors were removed and log tip forceps were placed in the port #1 and the cutting needle driver was placed in to port #3.  The vaginal cuff was closed with running suture if 0 v-lock. The pelvis was irrigated. Marland KitchenMarland KitchenMarland KitchenExcellent hemostasis was noted. Arista was placed along the vaginal cuff.  All pelvic pedicles were examined and hemostasis was noted.  All instruments removed from the ports. All ports were removed under direct Visualization. The pneumoperitoneum was released. The skin incisions were closed with 4-0 Vicryl and then covered with Derma bond.     Sponge lap and needle counts were correct x. The patient was awakened from anesthesia and taken to the recovery room in stable condition.

## 2020-03-29 NOTE — Progress Notes (Signed)
Pt family called and updated about pt surgery delay d/t case before pt is running late. Pt was able to speak with husband. Pt stated she was comfortable and did not need anything.

## 2020-03-29 NOTE — Anesthesia Procedure Notes (Signed)
Procedure Name: Intubation Date/Time: 03/29/2020 12:14 PM Performed by: Harden Mo, CRNA Pre-anesthesia Checklist: Patient identified, Emergency Drugs available, Suction available and Patient being monitored Patient Re-evaluated:Patient Re-evaluated prior to induction Oxygen Delivery Method: Circle System Utilized Preoxygenation: Pre-oxygenation with 100% oxygen Induction Type: IV induction Ventilation: Mask ventilation without difficulty and Oral airway inserted - appropriate to patient size Laryngoscope Size: Mac and 4 Grade View: Grade I Tube type: Oral Tube size: 7.5 mm Number of attempts: 1 Airway Equipment and Method: Stylet and Oral airway Placement Confirmation: ETT inserted through vocal cords under direct vision,  positive ETCO2 and breath sounds checked- equal and bilateral Secured at: 22 cm Tube secured with: Tape Dental Injury: Teeth and Oropharynx as per pre-operative assessment  Comments: Intubation by Melinda Crutch, SRNA

## 2020-03-29 NOTE — H&P (Signed)
Date of Initial H&P: 12/114/2021  History reviewed, patient examined, no change in status, stable for surgery.

## 2020-03-29 NOTE — Anesthesia Postprocedure Evaluation (Signed)
Anesthesia Post Note  Patient: Leanna Sato  Procedure(s) Performed: XI ROBOTIC ASSISTED TOTAL HYSTERECTOMY WITH BILATERAL SALPINGO OOPHORECTOMY (Bilateral Abdomen)     Patient location during evaluation: PACU Anesthesia Type: General Level of consciousness: awake and alert Pain management: pain level controlled Vital Signs Assessment: post-procedure vital signs reviewed and stable Respiratory status: spontaneous breathing, nonlabored ventilation and respiratory function stable Cardiovascular status: blood pressure returned to baseline and stable Postop Assessment: no apparent nausea or vomiting Anesthetic complications: no   No complications documented.  Last Vitals:  Vitals:   03/29/20 1530 03/29/20 1600  BP: (!) 159/65 (!) 146/59  Pulse: (!) 55 (!) 52  Resp: 14 16  Temp: (!) 36.2 C (!) 36.4 C  SpO2: 100% 94%    Last Pain:  Vitals:   03/29/20 1600  TempSrc: Oral                 Lidia Collum

## 2020-03-30 ENCOUNTER — Encounter (HOSPITAL_COMMUNITY): Payer: Self-pay | Admitting: Obstetrics and Gynecology

## 2020-03-30 DIAGNOSIS — N888 Other specified noninflammatory disorders of cervix uteri: Secondary | ICD-10-CM | POA: Diagnosis not present

## 2020-03-30 DIAGNOSIS — N72 Inflammatory disease of cervix uteri: Secondary | ICD-10-CM | POA: Diagnosis not present

## 2020-03-30 DIAGNOSIS — N83291 Other ovarian cyst, right side: Secondary | ICD-10-CM | POA: Diagnosis not present

## 2020-03-30 DIAGNOSIS — D259 Leiomyoma of uterus, unspecified: Secondary | ICD-10-CM | POA: Diagnosis not present

## 2020-03-30 LAB — CBC
HCT: 37.3 % (ref 36.0–46.0)
Hemoglobin: 12 g/dL (ref 12.0–15.0)
MCH: 29 pg (ref 26.0–34.0)
MCHC: 32.2 g/dL (ref 30.0–36.0)
MCV: 90.1 fL (ref 80.0–100.0)
Platelets: 265 10*3/uL (ref 150–400)
RBC: 4.14 MIL/uL (ref 3.87–5.11)
RDW: 14 % (ref 11.5–15.5)
WBC: 14.2 10*3/uL — ABNORMAL HIGH (ref 4.0–10.5)
nRBC: 0 % (ref 0.0–0.2)

## 2020-03-30 MED ORDER — OXYCODONE HCL 5 MG PO TABS: 5.0000 mg | ORAL_TABLET | ORAL | 0 refills | Status: AC | PRN

## 2020-03-30 MED ORDER — IBUPROFEN 800 MG PO TABS: 800.0000 mg | ORAL_TABLET | Freq: Three times a day (TID) | ORAL | 0 refills | Status: DC | PRN

## 2020-03-30 MED ORDER — ACETAMINOPHEN 500 MG PO TABS: 1000.0000 mg | ORAL_TABLET | Freq: Three times a day (TID) | ORAL | 0 refills | Status: AC | PRN

## 2020-03-30 NOTE — Discharge Summary (Signed)
Physician Discharge Summary  Patient ID: Debbie [name] MRN: [id number] DOB/AGE: 1946/01/29 74 y.o.  Admit date: 03/29/2020 Discharge date: 03/30/2020  Admission Diagnoses: Ovarian Mass   Discharge Diagnoses:  Active Problems:   Ovarian mass   S/P hysterectomy   Discharged Condition: stable  Hospital Course: pt was admitted for observation after undergoing a robotic assisted laparoscopic hysterectomy with bilateral salpingo-oophorectomy. She did well postoperatively with return of bowel and bladder function. HGB on pod #1 was 12.0.  Consults: None  Significant Diagnostic Studies: labs: HGB on pod #1 was 12.0.   Treatments: surgery: otic assisted laparoscopic hysterectomy with bilateral salpingo-oophorectomy.  Discharge Exam: Blood pressure 125/62, pulse (!) 52, temperature 98 F (36.7 C), temperature source Oral, resp. rate 18, height 5' 5.5" (1.664 m), weight 89.8 kg, SpO2 95 %. General appearance: alert, cooperative and no distress Resp: no distress normal, effort  GI: abnormal findings:  none.Marland Kitchen abdomen is soft appropriately tender nondistended Extremities: extremities normal, atraumatic, no cyanosis or edema Incision/Wound:well approximated no erythema or exudate.  Disposition:    Allergies as of 03/30/2020   No Known Allergies     Medication List    TAKE these medications   acetaminophen 500 MG tablet Commonly known as: TYLENOL Take 2 tablets (1,000 mg total) by mouth every 8 (eight) hours as needed.   amLODipine-atorvastatin 10-80 MG tablet Commonly known as: CADUET Take 1 tablet by mouth in the evening   aspirin EC 81 MG tablet Take 1 tablet (81 mg total) by mouth daily.   Centrum Silver Adult 50+ Tabs Take 1 tablet by mouth daily.   diclofenac Sodium 1 % Gel Commonly known as: Voltaren Apply 4 g topically 4 (four) times daily.   ezetimibe 10 MG tablet Commonly known as: ZETIA Take 1 tablet by mouth once daily   furosemide 20 MG  tablet Commonly known as: LASIX Take 1 tablet by mouth once daily   ibuprofen 800 MG tablet Commonly known as: ADVIL Take 1 tablet (800 mg total) by mouth every 8 (eight) hours as needed.   metoprolol succinate 50 MG 24 hr tablet Commonly known as: TOPROL-XL TAKE 1 TABLET BY MOUTH ONCE DAILY IMMEDIATELY FOLLOWING A MEAL What changed: See the new instructions.   nitroGLYCERIN 0.4 MG SL tablet Commonly known as: NITROSTAT Place 1 tablet (0.4 mg total) under the tongue every 5 (five) minutes as needed for chest pain.   oxyCODONE 5 MG immediate release tablet Commonly known as: Oxy IR/ROXICODONE Take 1-2 tablets (5-10 mg total) by mouth every 4 (four) hours as needed for up to 7 days for moderate pain.   oxymetazoline 0.05 % nasal spray Commonly known as: AFRIN Place 1 spray into both nostrils 2 (two) times daily as needed for congestion.   ramipril 10 MG capsule Commonly known as: ALTACE Take 1 capsule by mouth once daily       Follow-up Information    Christophe Louis, MD. Go in 2 week(s).   Specialty: Obstetrics and Gynecology Why: please keep your postoperative appoinment in 2 weeks  Contact information: [address] [place] [address] [phone]               Signed: Christophe Louis 03/30/2020, 10:29 AM

## 2020-03-30 NOTE — Plan of Care (Signed)
D/C education given, all questions answered, pt verbalizes understanding. Pt alert and oriented x4. Pt states no pain.   Problem: Education: Goal: Knowledge of General Education information will improve Description: Including pain rating scale, medication(s)/side effects and non-pharmacologic comfort measures Outcome: Adequate for Discharge   Problem: Health Behavior/Discharge Planning: Goal: Ability to manage health-related needs will improve Outcome: Adequate for Discharge   Problem: Clinical Measurements: Goal: Ability to maintain clinical measurements within normal limits will improve Outcome: Adequate for Discharge Goal: Will remain free from infection Outcome: Adequate for Discharge Goal: Diagnostic test results will improve Outcome: Adequate for Discharge Goal: Respiratory complications will improve Outcome: Adequate for Discharge Goal: Cardiovascular complication will be avoided Outcome: Adequate for Discharge   Problem: Activity: Goal: Risk for activity intolerance will decrease Outcome: Adequate for Discharge   Problem: Nutrition: Goal: Adequate nutrition will be maintained Outcome: Adequate for Discharge   Problem: Coping: Goal: Level of anxiety will decrease Outcome: Adequate for Discharge   Problem: Elimination: Goal: Will not experience complications related to bowel motility Outcome: Adequate for Discharge Goal: Will not experience complications related to urinary retention Outcome: Adequate for Discharge   Problem: Pain Managment: Goal: General experience of comfort will improve Outcome: Adequate for Discharge   Problem: Safety: Goal: Ability to remain free from injury will improve Outcome: Adequate for Discharge   Problem: Skin Integrity: Goal: Risk for impaired skin integrity will decrease Outcome: Adequate for Discharge   Problem: Education: Goal: Knowledge of the prescribed therapeutic regimen will improve Outcome: Adequate for  Discharge Goal: Understanding of sexual limitations or changes related to disease process or condition will improve Outcome: Adequate for Discharge Goal: Individualized Educational Video(s) Outcome: Adequate for Discharge   Problem: Self-Concept: Goal: Communication of feelings regarding changes in body function or appearance will improve Outcome: Adequate for Discharge   Problem: Skin Integrity: Goal: Demonstration of wound healing without infection will improve Outcome: Adequate for Discharge

## 2020-03-31 ENCOUNTER — Encounter (HOSPITAL_COMMUNITY): Payer: Self-pay | Admitting: Obstetrics and Gynecology

## 2020-03-31 LAB — SURGICAL PATHOLOGY

## 2020-03-31 NOTE — Transfer of Care (Signed)
Immediate Anesthesia Transfer of Care Note  Patient: Debbie [name]  Procedure(s) Performed: XI ROBOTIC ASSISTED TOTAL HYSTERECTOMY WITH BILATERAL SALPINGO OOPHORECTOMY (Bilateral Abdomen)  Patient Location: PACU  Anesthesia Type:General  Level of Consciousness: awake and drowsy  Airway & Oxygen Therapy: Patient Spontanous Breathing  Post-op Assessment: Report given to RN, Post -op Vital signs reviewed and stable and Patient moving all extremities X 4  Post vital signs: Reviewed and stable  Last Vitals:  Vitals Value Taken Time  BP 116/45 03/30/20 1100  Temp 36.7 C 03/30/20 0700  Pulse 56 03/30/20 1100  Resp 18 03/30/20 0700  SpO2 95 % 03/30/20 0700    Last Pain:  Vitals:   03/30/20 1123  TempSrc:   PainSc: 0-No pain      Patients Stated Pain Goal: 3 (16/10/96 0454)  Complications: No complications documented.

## 2020-12-12 ENCOUNTER — Other Ambulatory Visit: Payer: Self-pay | Admitting: Internal Medicine

## 2020-12-12 DIAGNOSIS — Z1231 Encounter for screening mammogram for malignant neoplasm of breast: Secondary | ICD-10-CM

## 2021-01-05 ENCOUNTER — Ambulatory Visit
Admission: RE | Admit: 2021-01-05 | Discharge: 2021-01-05 | Disposition: A | Discharge status: Home / Self Care | Payer: Medicare PPO | Source: Ambulatory Visit | Attending: Internal Medicine | Admitting: Internal Medicine

## 2021-01-05 ENCOUNTER — Other Ambulatory Visit: Payer: Self-pay

## 2021-01-05 DIAGNOSIS — Z1231 Encounter for screening mammogram for malignant neoplasm of breast: Secondary | ICD-10-CM

## 2021-01-05 IMAGING — MG MM DIGITAL SCREENING BILAT W/ TOMO AND CAD
6 of 10 series · 6 of 30 positions shown · non-contrast
Comparison: Previous exam(s).

CLINICAL DATA: Screening.

EXAM:
DIGITAL SCREENING BILATERAL MAMMOGRAM WITH TOMOSYNTHESIS AND CAD
TECHNIQUE: Bilateral screening digital craniocaudal and mediolateral oblique
mammograms were obtained. Bilateral screening digital breast
tomosynthesis was performed. The images were evaluated with
computer-aided detection.

[R MLO synth-2D (1 of 2)]
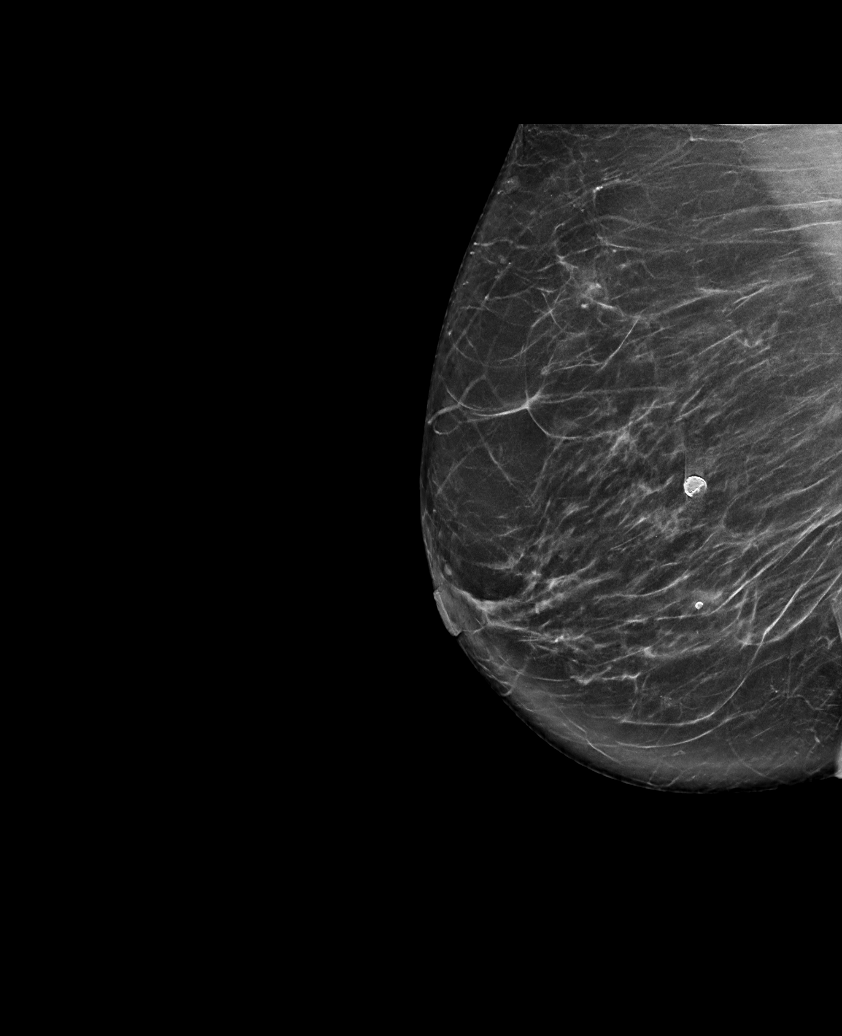

[L CC synth-2D]
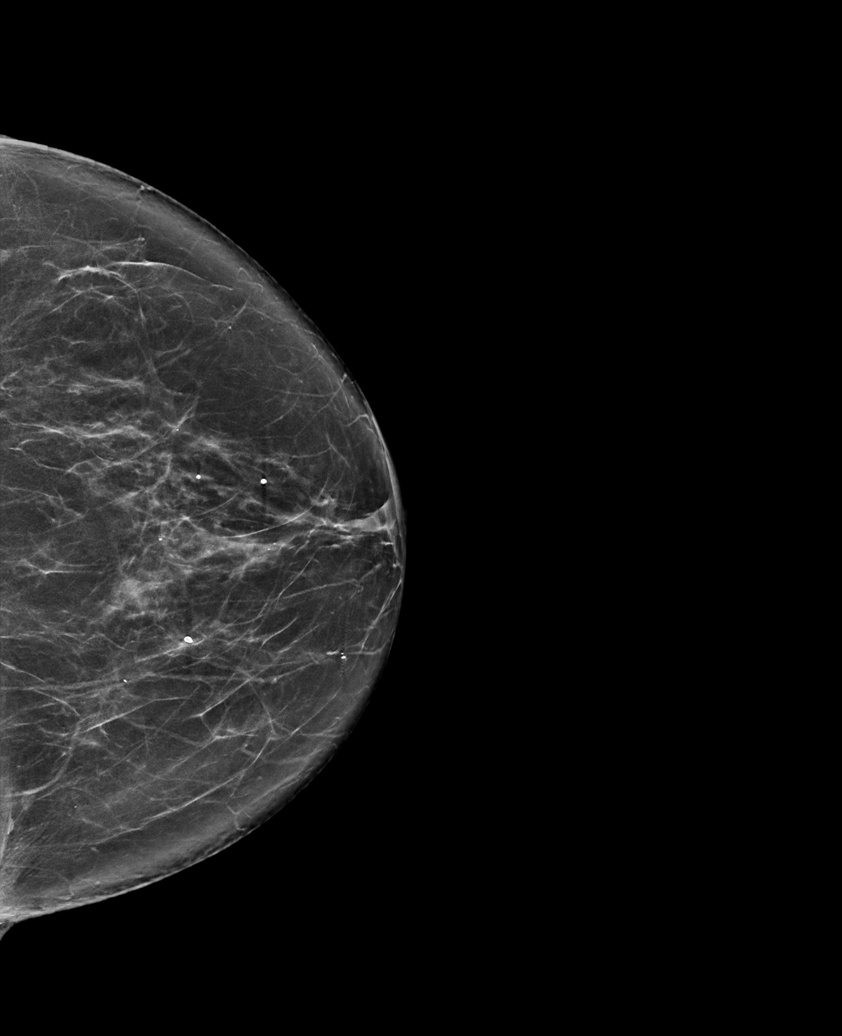

[R CC synth-2D]
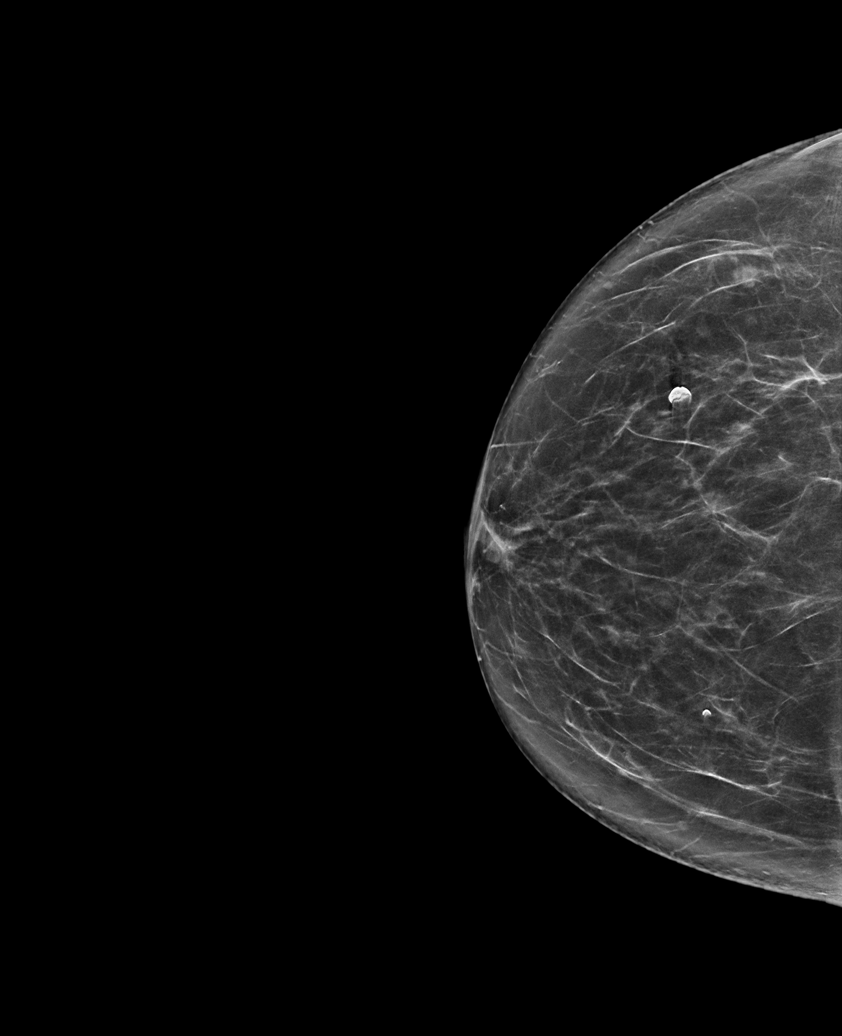

[L MLO synth-2D]
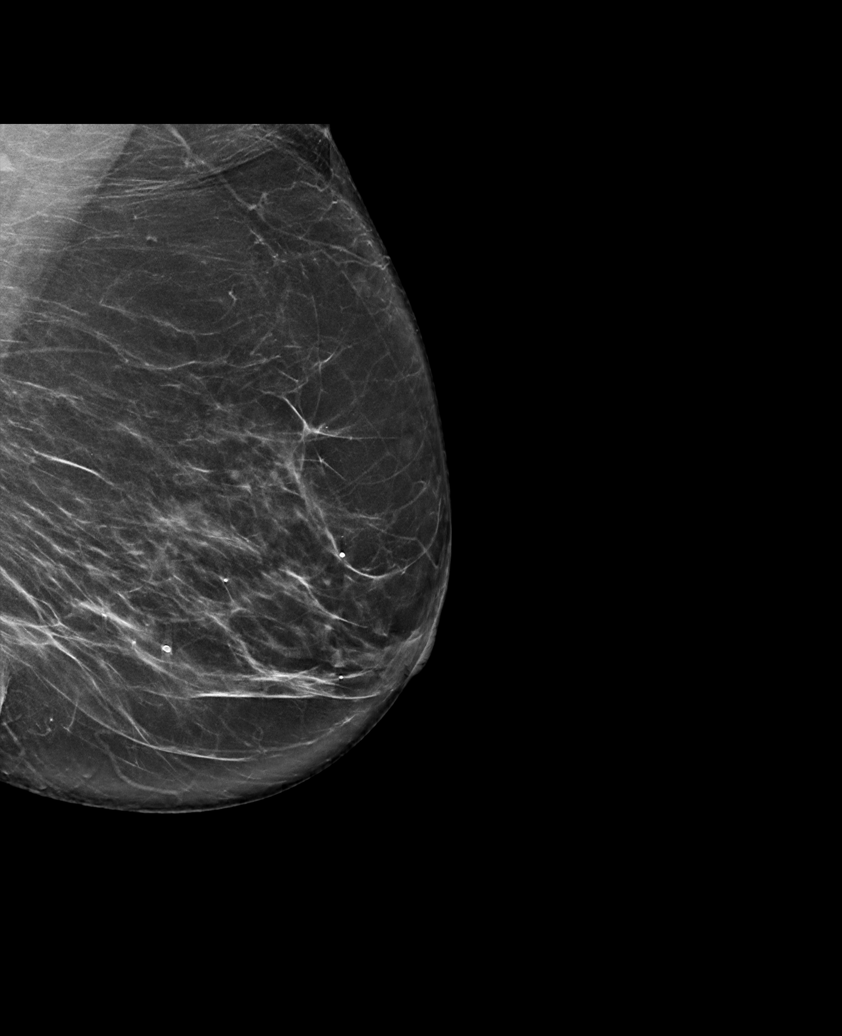

[R MLO synth-2D (2 of 2)]
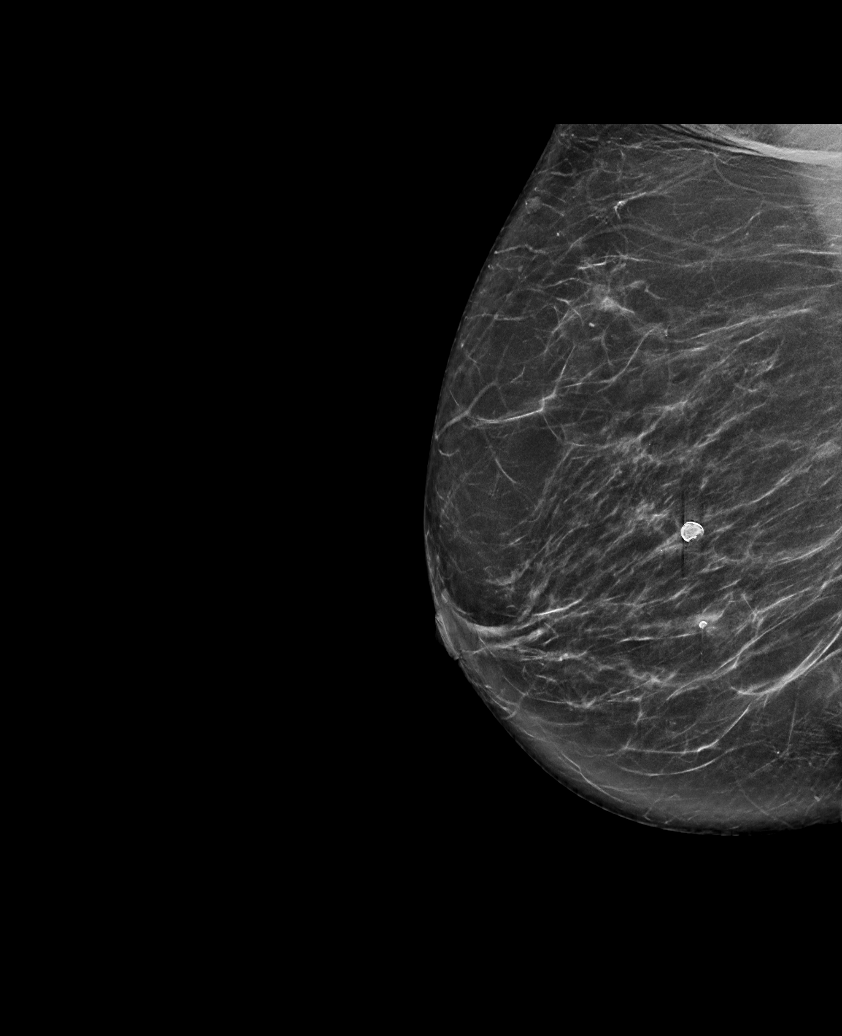

[L MLO tomo · tomo slice 43/84.0]
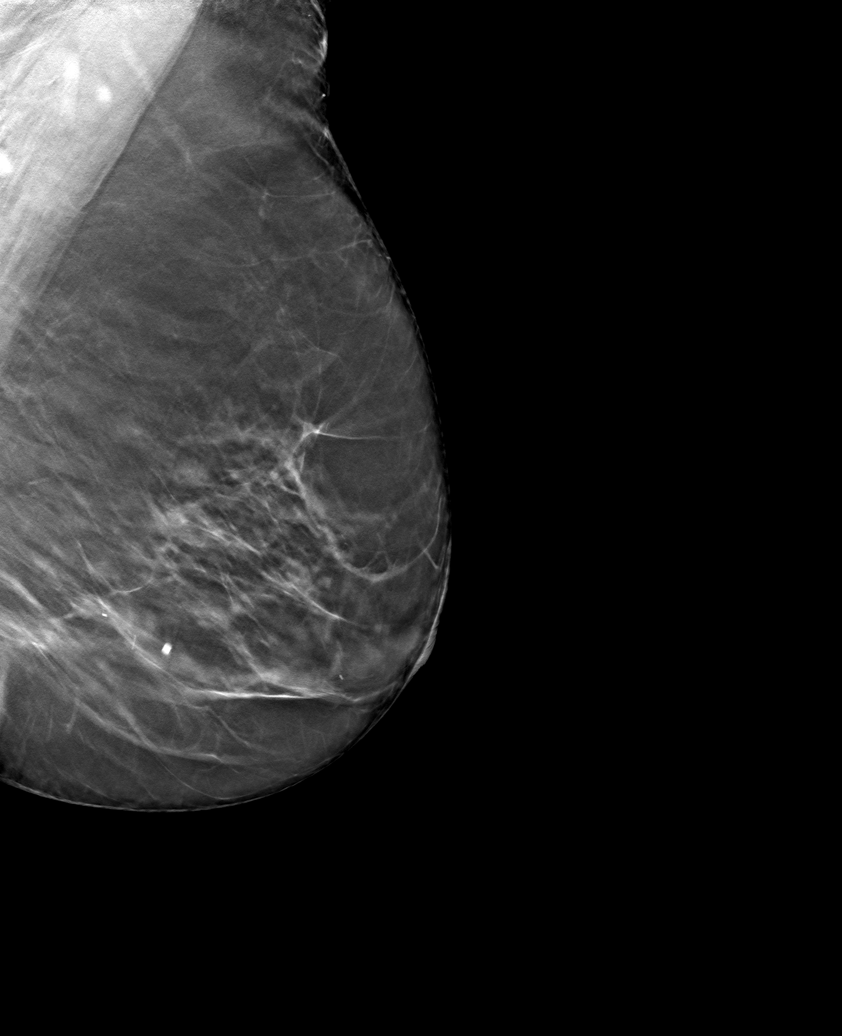

[6 of 30 positions shown; findings below may reference images not displayed]

ACR Breast Density Category b: There are scattered areas of
fibroglandular density.
FINDINGS: There are no findings suspicious for malignancy.
IMPRESSION: No mammographic evidence of malignancy. A result letter of this
screening mammogram will be mailed directly to the patient.

RECOMMENDATION:
Screening mammogram in one year. (Code:[BY])

BI-RADS CATEGORY  1: Negative.

## 2021-03-01 ENCOUNTER — Other Ambulatory Visit: Payer: Self-pay | Admitting: Neurological Surgery

## 2021-03-01 DIAGNOSIS — M48062 Spinal stenosis, lumbar region with neurogenic claudication: Secondary | ICD-10-CM

## 2021-03-09 ENCOUNTER — Other Ambulatory Visit: Payer: Self-pay | Admitting: Cardiovascular Disease

## 2021-03-13 ENCOUNTER — Encounter: Payer: Self-pay | Admitting: Cardiovascular Disease

## 2021-03-13 ENCOUNTER — Ambulatory Visit: Payer: Medicare PPO | Admitting: Cardiovascular Disease

## 2021-03-13 ENCOUNTER — Other Ambulatory Visit: Payer: Self-pay

## 2021-03-13 DIAGNOSIS — E669 Obesity, unspecified: Secondary | ICD-10-CM

## 2021-03-13 DIAGNOSIS — I10 Essential (primary) hypertension: Secondary | ICD-10-CM

## 2021-03-13 DIAGNOSIS — Z79899 Other long term (current) drug therapy: Secondary | ICD-10-CM | POA: Diagnosis not present

## 2021-03-13 DIAGNOSIS — E785 Hyperlipidemia, unspecified: Secondary | ICD-10-CM | POA: Diagnosis not present

## 2021-03-13 DIAGNOSIS — Z9861 Coronary angioplasty status: Secondary | ICD-10-CM | POA: Diagnosis not present

## 2021-03-13 DIAGNOSIS — I2510 Atherosclerotic heart disease of native coronary artery without angina pectoris: Secondary | ICD-10-CM | POA: Diagnosis not present

## 2021-03-13 MED ORDER — OLMESARTAN MEDOXOMIL 20 MG PO TABS: 20.0000 mg | ORAL_TABLET | Freq: Every day | ORAL | 11 refills | Status: DC

## 2021-03-13 NOTE — Progress Notes (Signed)
Patient ID: Debbie [name], female   DOB: 1945-10-13, 75 y.o.   MRN: [id number]    PCP: Dr. [facility]  HPI: Debbie [name], is a 75 y.o. female who presents to the office today for a 23 month followup evaluation.  Debbie [name] has known CAD and in 2001 suffered an acute coronary syndrome and was found to have total occlusion of her RCA. She had 3 stents placed to her RCA. Subsequent catheterization in 2006 showed all 3 stents widely patent. Additionally, she has a history of hypertension as well as hyperlipidemia. She also has a history of atrial tachycardia. A nuclear perfusion study in September 2013 showed minimal anterior thinning not felt to be significant.  Because of mild possible claudication symptoms her lower extremity, she underwent a lower extremity arterial Doppler study which suggested mild arterial insufficiency with ABIs of 0.83 bilaterally.  When I  saw her in September 2014 laboratory suggested that she had evolved into type 2 diabetes mellitus. At that time her hemoglobin A1c was 7.0 and her fasting blood sugar was 128. She had normal renal function. She subsequently saw Dr. Lou Miner who did not initiate medical therapy but to pursue aggressive dietary adjustments initially.   She was evaluated in September 2017 by Kerin Ransom after experiencing left-sided chest pressure which was not exertional and oftentimes occurring at the night.  It did not radiate to her arms, jaw, or back.  She denied associated nausea, vomiting or diaphoresis.  She was referred for nuclear perfusion study which was done on 01/19/2016.  This was a normal study.  Ejection fraction was 58%.  She underwent blood work time and on her dose of Lipitor 80 mg and Zetia 10 mg total cholesterol was 150, triglycerides 110, HDL 64, and LDL 64. There was very minimal renal insufficiency with a creatinine of 1.14.  Her fasting glucose was 150.    I saw her in November 2018 at which time she was remaining stable  and denied chest pain PND orthopnea presyncope or syncope.   I saw her on March 26, 2018 at which time she was continuing to do well and new to be on combination amlodipine/atorvastatin 10/80 in addition to Zetia 10 mg, HCTZ 25 mg, Toprol-XL 50 mg, and ramipril 10 mg daily for hypertension as well as hyperlipidemia.  At that time there was no chest pain or shortness of breath.   I last evaluated on April 08, 2019.  Over the prior year her blood pressure remained fairly stable and she continued to be without recurrent anginal symptomatology.  She started to notice swelling of her ankles bilaterally despite taking HCTZ 25 mg daily.  She continues to be on Caduet 10/80, HCTZ 25 mg daily, and ramipril 10 mg.  She is on Zetia 10 mg for hyperlipidemia.  There was no chest pain PND orthopnea, presyncope or syncope and was unaware of palpitations.  I discussed the importance of increased weight loss and exercise.  Since I last saw her 2 years ago, she continues to feel well with reference to any chest pain.  She sees Dr. Soon at [facility].  She is scheduled to undergo MRI of her lumbar spine by Dr. Ronnald Ramp.  She has been evaluated by Dr. Nelva Bush for leg discomfort.  She had undergone a robotic assisted laparoscopic hysterectomy with bilateral salpingo-oophorectomy in December 2021 for an ovarian mass.  Presently, she denies chest pain or shortness of breath.  She has had some blood pressure  lability.  She presents for evaluation.  Past Medical History:  Diagnosis Date   Arthritis    CAD (coronary artery disease) Aug 2001   RCA stent X3, patent '06. Nuc low risk 9/13   Cataract    beginning stages   Diabetes mellitus without complication ([place])    History of stress test 01/08/2012   Showed minimal anterior thinning not felt to be significant.   Hx of echocardiogram 12/12/2011   EF>55%   Hypercholesteremia    Hypertension    Myocardial infarction [facility])    18 years ago    age 49   PAF  (paroxysmal atrial fibrillation) [facility]) Aug 2013   SSS component with some bradycardia   Stroke [facility]) 1998   Vitamin D deficiency     Past Surgical History:  Procedure Laterality Date   CATARACT EXTRACTION, BILATERAL     CHOLECYSTECTOMY     COLONOSCOPY     CORONARY ANGIOGRAM  May 2006   patent stents   CORONARY ANGIOPLASTY WITH STENT PLACEMENT  Aug 2001   X 3   EYE SURGERY     MULTIPLE TOOTH EXTRACTIONS     ROBOTIC ASSISTED TOTAL HYSTERECTOMY WITH BILATERAL SALPINGO OOPHERECTOMY Bilateral 03/29/2020   Procedure: XI ROBOTIC ASSISTED TOTAL HYSTERECTOMY WITH BILATERAL SALPINGO OOPHORECTOMY;  Surgeon: Christophe Louis, MD;  Location: [place];  Service: Gynecology;  Laterality: Bilateral;  Tracie to RNFA confirmed on 02/16/20 CS    No Known Allergies  Current Outpatient Medications  Medication Sig Dispense Refill   acetaminophen (TYLENOL) 500 MG tablet Take 2 tablets (1,000 mg total) by mouth every 8 (eight) hours as needed. 30 tablet 0   amLODipine-atorvastatin (CADUET) 10-80 MG tablet Take 1 tablet by mouth in the evening 90 tablet 0   aspirin EC 81 MG tablet Take 1 tablet (81 mg total) by mouth daily. 90 tablet 3   ezetimibe (ZETIA) 10 MG tablet Take 1 tablet by mouth once daily 90 tablet 0   furosemide (LASIX) 20 MG tablet Take 1 tablet by mouth once daily (Patient taking differently: Take 20 mg by mouth daily.) 90 tablet 3   metoprolol succinate (TOPROL-XL) 50 MG 24 hr tablet TAKE 1 TABLET BY MOUTH ONCE DAILY IMMEDIATELY FOLLOWING A MEAL 90 tablet 0   Multiple Vitamins-Minerals (CENTRUM SILVER ADULT 50+) TABS Take 1 tablet by mouth daily.     nitroGLYCERIN (NITROSTAT) 0.4 MG SL tablet Place 1 tablet (0.4 mg total) under the tongue every 5 (five) minutes as needed for chest pain. 25 tablet 3   olmesartan (BENICAR) 20 MG tablet Take 1 tablet (20 mg total) by mouth daily. 30 tablet 11   oxymetazoline (AFRIN) 0.05 % nasal spray Place 1 spray into both nostrils 2 (two) times daily as needed for  congestion.     Current Facility-Administered Medications  Medication Dose Route Frequency Provider Last Rate Last Admin   0.9 %  sodium chloride infusion  500 mL Intravenous Once Milus Banister, MD        Socially she denies tobacco or alcohol. She does not routinely exercise.   ROS General: Negative; No fevers, chills, or night sweats;  HEENT: Negative; No changes in vision or hearing, sinus congestion, difficulty swallowing Pulmonary: Negative; No cough, wheezing, shortness of breath, hemoptysis Cardiovascular: Negative; No chest pain, presyncope, syncope, palpitations GI: Negative; No nausea, vomiting, diarrhea, or abdominal pain GU: Negative; No dysuria, hematuria, or difficulty voiding Musculoskeletal: Negative; no myalgias, joint pain, or weakness Hematologic/Oncology: Negative; no easy bruising, bleeding Endocrine: Negative;  no heat/cold intolerance; no diabetes Neuro: Negative; no changes in balance, headaches Skin: Negative; No rashes or skin lesions Psychiatric: Negative; No behavioral problems, depression Sleep: Negative; No snoring, daytime sleepiness, hypersomnolence, bruxism, restless legs, hypnogognic hallucinations, no cataplexy Other comprehensive 14 point system review is negative.   PE BP 140/72   Pulse (!) 57   Ht _0  (1.651 m)   Wt 199 lb (90.3 kg)   SpO2 97%   BMI 33.12 kg/m    BP blood pressure by me was 160/76  Wt Readings from Last 3 Encounters:  03/13/21 199 lb (90.3 kg)  03/29/20 198 lb (89.8 kg)  03/27/20 198 lb 3.2 oz (89.9 kg)   General: Alert, oriented, no distress.  Skin: normal turgor, no rashes, warm and dry HEENT: Normocephalic, atraumatic. Pupils equal round and reactive to light; sclera anicteric; extraocular muscles intact;  Nose without nasal septal hypertrophy Mouth/Parynx benign; Mallinpatti scale 3 Neck: No JVD, no carotid bruits; normal carotid upstroke Lungs: clear to ausculatation and percussion; no wheezing or  rales Chest wall: without tenderness to palpitation Heart: PMI not displaced, RRR, s1 s2 normal, 1/6 systolic murmur, no diastolic murmur, no rubs, gallops, thrills, or heaves Abdomen: soft, nontender; no hepatosplenomehaly, BS+; abdominal aorta nontender and not dilated by palpation. Back: no CVA tenderness Pulses 2+ Musculoskeletal: full range of motion, normal strength, no joint deformities Extremities: no clubbing cyanosis or edema, Homan's sign negative  Neurologic: grossly nonfocal; Cranial nerves grossly wnl Psychologic: Normal mood and affect  March 13, 2021 ECG (independently read by me):  Sinus bradycardia at 57; LVH, old IMI  December 2020 ECG (independently read by me): Sinus bradycardia 58 bpm with an isolated PVC.  Nonspecific T wave abnormality inferolaterally.  December 2019 ECG (independently read by me): Sinus bradycardia 59 bpm.  PAC.  November 2018 ECG (independently read by me): Sinus bradycardia 53 with sinus arrhythmia.  Small inferior Q waves.  November 2017 ECG (independently read by me): Sinus bradycardia at 53 with mild sinus arrhythmia.  Nonspecific T changes.  September 2016 ECG (independently read by me): Sinus bradycardia 55 bpm.  No significant ST-T changes.  March 2015 ECG (independentlyby me): Sinus bradycardia 55 beats per minute. No ectopy.  Prior September,16 2014 ECG: Sinus bradycardia 48 beats per minute. Intervals normal  LABS: BMP Latest Ref Rng & Units 03/27/2020 02/16/2020 04/28/2019  Glucose 70 - 99 mg/dL 192(H) 177(H) 144(H)  BUN 8 - 23 mg/dL _1 Creatinine 0.44 - 1.00 mg/dL 0.96 0.97 1.03(H)  BUN/Creat Ratio 12 - 28 - 12 12  Sodium 135 - 145 mmol/L 140 142 140  Potassium 3.5 - 5.1 mmol/L 3.3(L) 3.4(L) 3.9  Chloride 98 - 111 mmol/L 105 104 101  CO2 22 - 32 mmol/L _2 Calcium 8.9 - 10.3 mg/dL 9.6 9.8 9.9   Hepatic Function Latest Ref Rng & Units 03/26/2018 01/18/2016 01/09/2015  Total Protein 6.0 - 8.5 g/dL 6.8 6.6 6.9   Albumin 3.5 - 4.8 g/dL 4.3 4.1 4.1  AST 0 - 40 IU/L _3 ALT 0 - 32 IU/L _4 Alk Phosphatase 39 - 117 IU/L 92 72 77  Total Bilirubin 0.0 - 1.2 mg/dL 0.7 0.4 0.6   CBC Latest Ref Rng & Units 03/30/2020 03/27/2020 04/28/2019  WBC 4.0 - 10.5 K/uL 14.2(H) 8.2 7.5  Hemoglobin 12.0 - 15.0 g/dL 12.0 13.3 12.8  Hematocrit 36.0 - 46.0 % 37.3 40.6 38.3  Platelets 150 - 400 K/uL 265  305 325   Lab Results  Component Value Date   MCV 90.1 03/30/2020   MCV 90.6 03/27/2020   MCV 92 04/28/2019   Lab Results  Component Value Date   TSH 1.410 04/28/2019   Lab Results  Component Value Date   HGBA1C 7.2 (H) 04/28/2019   Lipid Panel     Component Value Date/Time   CHOL 141 02/16/2020 0839   TRIG 104 02/16/2020 0839   HDL 54 02/16/2020 0839   CHOLHDL 2.6 02/16/2020 0839   CHOLHDL 2.3 01/18/2016 0823   VLDL 22 01/18/2016 0823   LDLCALC 68 02/16/2020 0839   RADIOLOGY: No results found.  IMPRESSION:  1. CAD S/P percutaneous coronary angioplasty   2. Essential hypertension   3. Hyperlipidemia with target LDL less than 70   4. Medication management   5. Mild obesity    ASSESSMENT AND PLAN: Ms. Rockhold is a 75 year old female who is 21 years following her initial multi lesion intervention to her RCA in the setting of an ACS at which time she was found to have total RCA occlusion and had successful insertion of 3 stents.  On follow-up catheterization in 2006 all stents were widely patent.  Over the past 2 decades she has been aggressively treated with lipid management and has been on atorvastatin 80 mg in addition to Zetia 10 mg.  Lipid studies in December 2019 showed a total cholesterol 154, HDL 62 LDL 71 triglycerides 103.  When I last saw her in 2020, she was having some mild edema despite taking HCTZ and at that time was given a prescription for furosemide.  Presently, she has been on a regimen consisting of combination amlodipine/atorvastatin 10/80, Zetia 10 mg, furosemide 20 mg,  metoprolol succinate 50 mg, and ramipril 10 mg.  She has not required any nitroglycerin use.  Her blood pressure today is elevated.  I have suggested changing her from ACE in addition to ARB therapy and will discontinue ramipril.  In its place I will initially start olmesartan at 20 mg.  She should monitor her blood pressure and if blood pressure consistently is greater than 140 the dose should be further titrated to 40 mg daily.  In 2 weeks I am recommending she undergo follow-up laboratory with a comprehensive metabolic panel, CBC, lipids and TSH level.  BMI is 33.12.  Weight loss and increased exercise was recommended.  She will follow-up with Coletta Memos in approximately 6 to 8 weeks and see me in 4 to 6 months.  Troy Sine, MD, [facility]  03/20/2021 4:43 PM

## 2021-03-13 NOTE — Patient Instructions (Signed)
Medication Instructions:  STOP: Ramipril 10 mg START: Olmersartan 20 mg daily  *If you need a refill on your cardiac medications before your next appointment, please call your pharmacy*   Lab Work: Your physician recommends that you return for lab work in 2 weeks (fasting lipid, CMP, CBC, TSH)     Testing/Procedures: None ordered today   Follow-Up: At [facility], you and your health needs are our priority.  As part of our continuing mission to provide you with exceptional heart care, we have created designated Provider Care Teams.  These Care Teams include your primary Cardiologist (physician) and Advanced Practice Providers (APPs -  Physician Assistants and Nurse Practitioners) who all work together to provide you with the care you need, when you need it.  We recommend signing up for the patient portal called "MyChart".  Sign up information is provided on this After Visit Summary.  MyChart is used to connect with patients for Virtual Visits (Telemedicine).  Patients are able to view lab/test results, encounter notes, upcoming appointments, etc.  Non-urgent messages can be sent to your provider as well.   To learn more about what you can do with MyChart, go to [email or web address].    Your next appointment:   6-8 week(s)  The format for your next appointment:   In Person  Provider:   Coletta Memos, FNP    Then, Shelva Majestic, MD will plan to see you again in 6 month(s).

## 2021-03-20 ENCOUNTER — Encounter: Payer: Self-pay | Admitting: Cardiovascular Disease

## 2021-03-26 ENCOUNTER — Other Ambulatory Visit: Payer: Self-pay

## 2021-03-26 ENCOUNTER — Ambulatory Visit
Admission: RE | Admit: 2021-03-26 | Discharge: 2021-03-26 | Disposition: A | Discharge status: Home / Self Care | Payer: Medicare PPO | Source: Ambulatory Visit | Attending: Neurological Surgery | Admitting: Neurological Surgery

## 2021-03-26 DIAGNOSIS — M48062 Spinal stenosis, lumbar region with neurogenic claudication: Secondary | ICD-10-CM

## 2021-03-26 IMAGING — MR MR LUMBAR SPINE W/O CM
4 of 5 series · 17 of 48 positions shown · non-contrast
Comparison: MR lumbar [DATE]; CT lumbar [DATE]; X-ray
lumbar [DATE].

CLINICAL DATA: Low back and bilateral leg pain for 6 months.

EXAM:
MRI LUMBAR SPINE WITHOUT CONTRAST
TECHNIQUE: Multiplanar, multisequence MR imaging of the lumbar spine was
performed. No intravenous contrast was administered.

[Series 5: T2 · sagittal · 4.0mm · 0.73mm/px · 5 of 15 slices shown (1 of 2)]
[im 1/15]
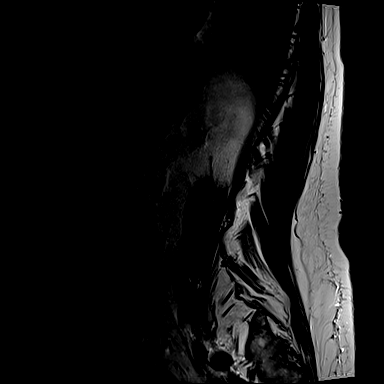
[im 4/15]
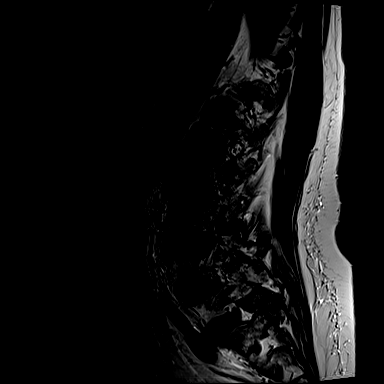
[im 8/15]
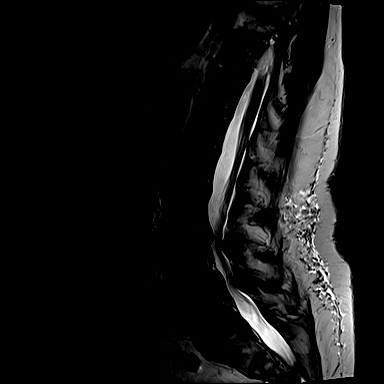
[im 11/15]
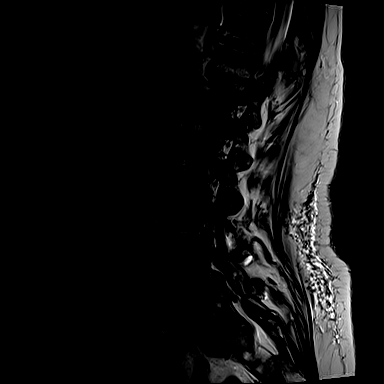
[im 15/15]
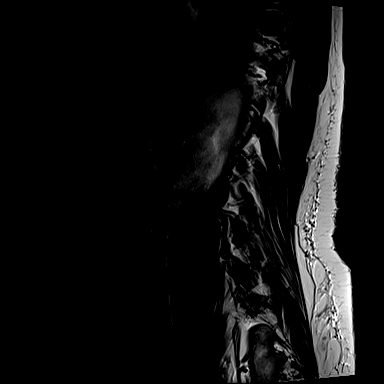

[Series 6: T1 · sagittal · 4.0mm · 0.73mm/px · 3 of 15 slices shown (1 of 2)]
[im 1/15]
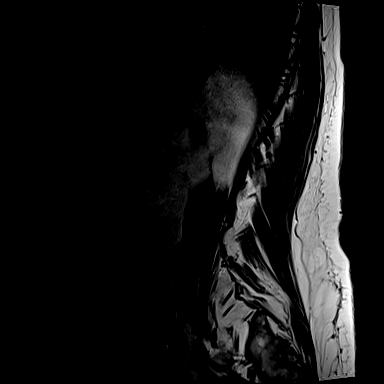
[im 8/15]
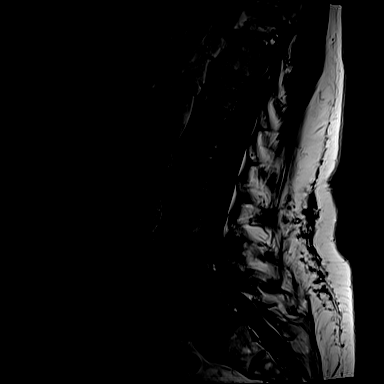
[im 15/15]
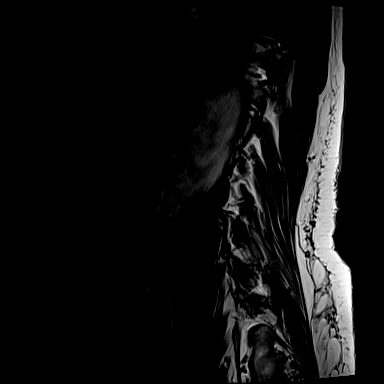

[Series 10: T1 · axial · 4.0mm · 0.28mm/px · z∈[-10,+168]mm · 3 of 42 slices shown (2 of 2)]
[im 6/42]
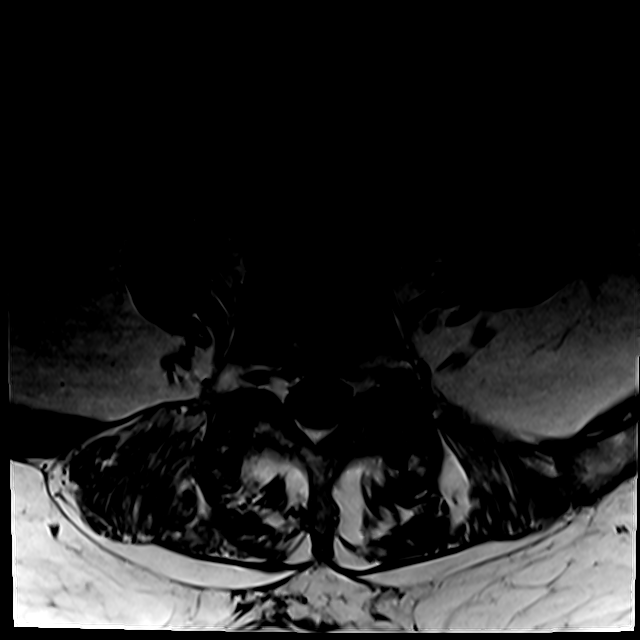
[im 22/42]
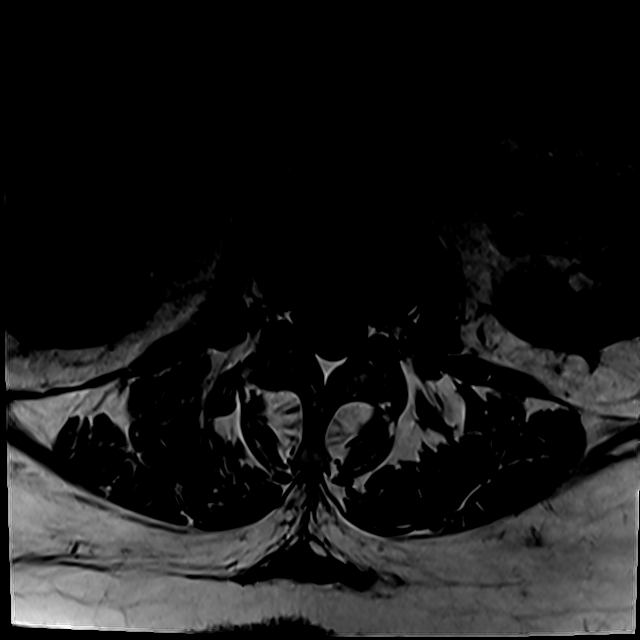
[im 36/42]
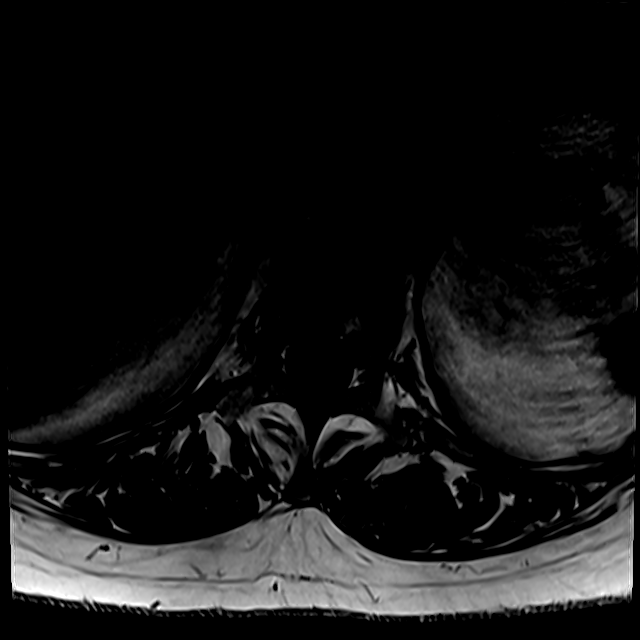

[Series 13: T2 · axial · 4.0mm · 0.28mm/px · z∈[-25,+168]mm · 6 of 42 slices shown (2 of 2)]
[im 3/42]
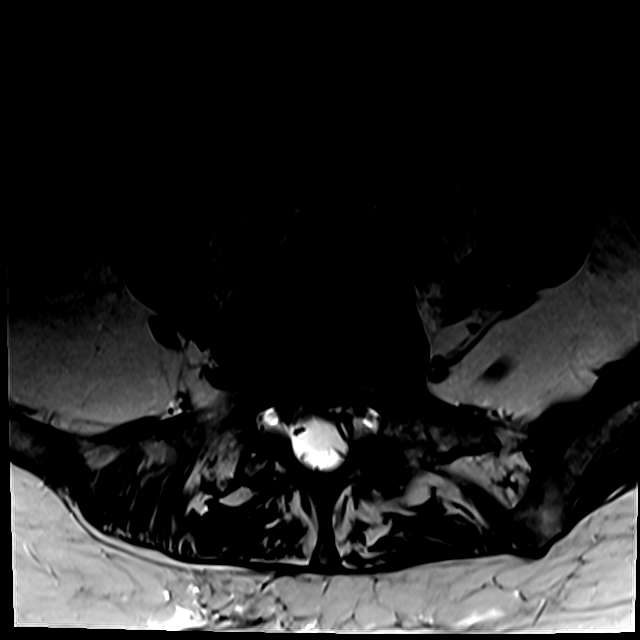
[im 6/42]
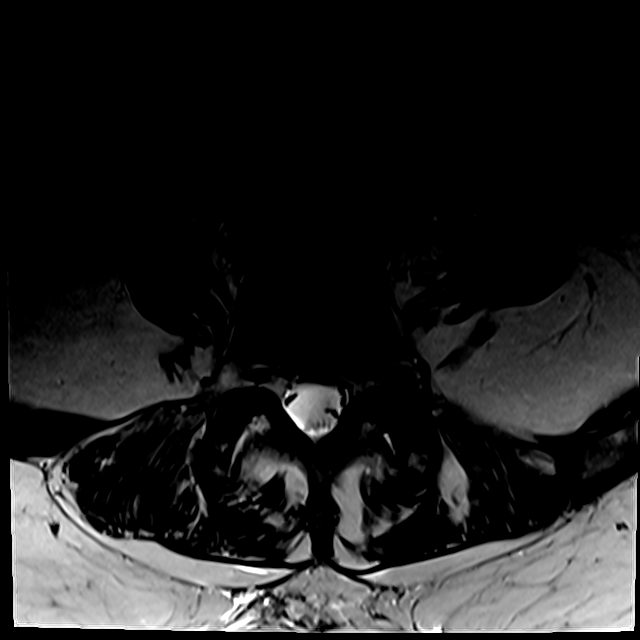
[im 9/42]
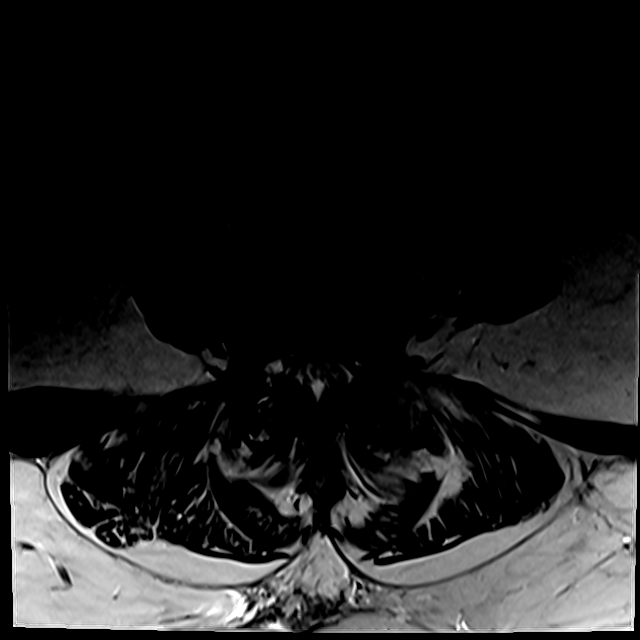
[im 14/42]
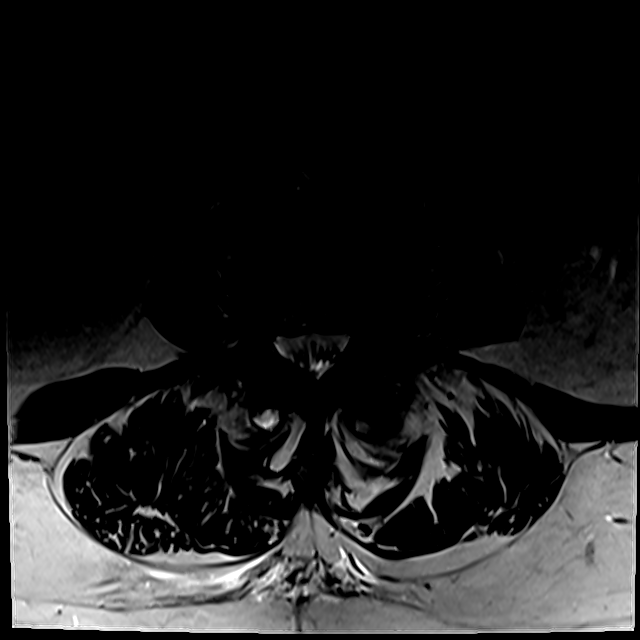
[im 22/42]
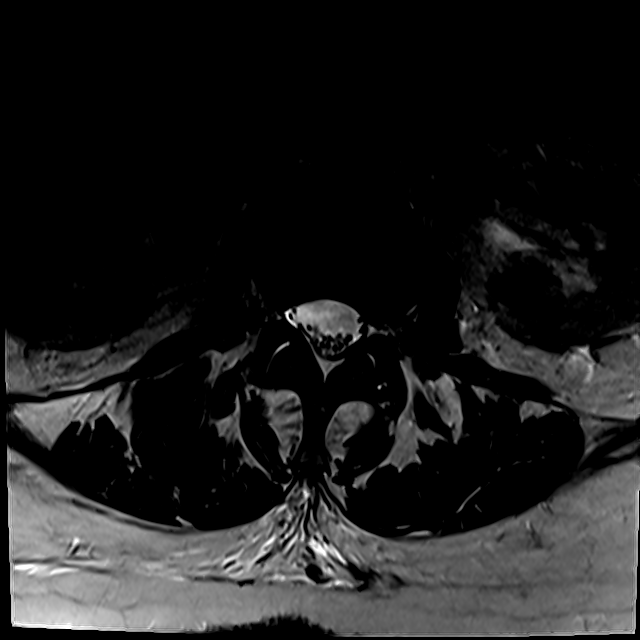
[im 36/42]
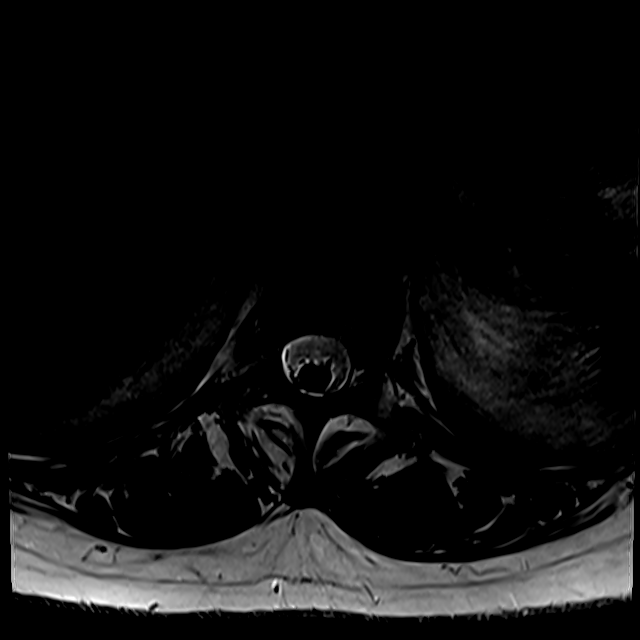

[17 of 48 positions shown; findings below may reference images not displayed]

FINDINGS: Segmentation:  Standard.

Alignment: Grade 1 anterolisthesis of L3 on L4 and L4 on L5
secondary to facet disease.

Vertebrae: No acute fracture, evidence of discitis, or aggressive
bone lesion.

Conus medullaris and cauda equina: Conus extends to the T12-L1
level. Conus and cauda equina appear normal.

Paraspinal and other soft tissues: No acute paraspinal abnormality.

Disc levels:

Disc spaces: Degenerative disease with disc height loss at L3-4 and
L4-5.

T12-L1: No significant disc bulge. No neural foraminal stenosis. No
central canal stenosis.

L1-L2: No significant disc bulge. No neural foraminal stenosis. No
central canal stenosis.

L2-L3: Minimal broad-based disc bulge. No foraminal or central canal
stenosis.

L3-L4: Broad-based disc bulge with prominent foraminal components.
Severe bilateral facet arthropathy with ligamentum flavum infolding.
Severe spinal stenosis. Bilateral subarticular recess stenosis.
Moderate left foraminal stenosis. Severe right foraminal stenosis.

L4-L5: Broad-based disc bulge flattening the ventral thecal sac.
Severe bilateral facet arthropathy with ligamentum flavum infolding.
Moderate spinal stenosis. Mild bilateral foraminal stenosis.

L5-S1: Mild broad-based disc bulge. Severe bilateral facet
arthropathy. Mild left foraminal stenosis. No right foraminal
stenosis.
IMPRESSION: 1. At L3-4 there is a broad-based disc bulge with prominent
foraminal components. Severe bilateral facet arthropathy with
ligamentum flavum infolding. Severe spinal stenosis. Bilateral
subarticular recess stenosis. Moderate left foraminal stenosis.
Severe right foraminal stenosis.
2. At L4-5 there is a broad-based disc bulge flattening the ventral
thecal sac. Severe bilateral facet arthropathy with ligamentum
flavum infolding. Moderate spinal stenosis. Mild bilateral foraminal
stenosis.
3. At L5-S1 there is a mild broad-based disc bulge. Severe bilateral
facet arthropathy. Mild left foraminal stenosis. No right foraminal
stenosis.

## 2021-03-29 LAB — COMPREHENSIVE METABOLIC PANEL
ALT: 20 IU/L (ref 0–32)
AST: 13 IU/L (ref 0–40)
Albumin/Globulin Ratio: 1.7 (ref 1.2–2.2)
Albumin: 4.1 g/dL (ref 3.7–4.7)
Alkaline Phosphatase: 86 IU/L (ref 44–121)
BUN/Creatinine Ratio: 17 (ref 12–28)
BUN: 15 mg/dL (ref 8–27)
Bilirubin Total: 0.6 mg/dL (ref 0.0–1.2)
CO2: 21 mmol/L (ref 20–29)
Calcium: 9.9 mg/dL (ref 8.7–10.3)
Chloride: 103 mmol/L (ref 96–106)
Creatinine, Ser: 0.9 mg/dL (ref 0.57–1.00)
Globulin, Total: 2.4 g/dL (ref 1.5–4.5)
Glucose: 162 mg/dL — ABNORMAL HIGH (ref 70–99)
Potassium: 3.6 mmol/L (ref 3.5–5.2)
Sodium: 140 mmol/L (ref 134–144)
Total Protein: 6.5 g/dL (ref 6.0–8.5)
eGFR: 66 mL/min/{1.73_m2} (ref >59)

## 2021-03-29 LAB — CBC
Hematocrit: 37.3 % (ref 34.0–46.6)
Hemoglobin: 12.2 g/dL (ref 11.1–15.9)
MCH: 29.4 pg (ref 26.6–33.0)
MCHC: 32.7 g/dL (ref 31.5–35.7)
MCV: 90 fL (ref 79–97)
Platelets: 339 10*3/uL (ref 150–450)
RBC: 4.15 x10E6/uL (ref 3.77–5.28)
RDW: 13 % (ref 11.7–15.4)
WBC: 8.9 10*3/uL (ref 3.4–10.8)

## 2021-03-29 LAB — LIPID PANEL
Chol/HDL Ratio: 3 ratio (ref 0.0–4.4)
Cholesterol, Total: 154 mg/dL (ref 100–199)
HDL: 51 mg/dL (ref >39)
LDL Chol Calc (NIH): 83 mg/dL (ref 0–99)
Triglycerides: 111 mg/dL (ref 0–149)
VLDL Cholesterol Cal: 20 mg/dL (ref 5–40)

## 2021-03-29 LAB — TSH: TSH: 0.926 u[IU]/mL (ref 0.450–4.500)

## 2021-04-04 ENCOUNTER — Telehealth: Payer: Self-pay | Admitting: Cardiovascular Disease

## 2021-04-04 NOTE — Telephone Encounter (Signed)
°*  STAT* If patient is at the pharmacy, call can be transferred to refill team.   1. Which medications need to be refilled? (please list name of each medication and dose if known)  nitroGLYCERIN (NITROSTAT) 0.4 MG SL tablet  2. Which pharmacy/location (including street and city if local pharmacy) is medication to be sent to? [place], [place]   3. Do they need a 30 day or 90 day supply? 30 with refills  Patient said she is out of medication and her rx is expired.

## 2021-04-05 MED ORDER — NITROGLYCERIN 0.4 MG SL SUBL: 0.4000 mg | SUBLINGUAL_TABLET | SUBLINGUAL | 3 refills | Status: AC | PRN

## 2021-04-05 NOTE — Telephone Encounter (Signed)
Refill sent to pharmacy.   

## 2021-05-10 NOTE — Progress Notes (Signed)
Office Visit    Patient Name: Debbie [name] Date of Encounter: 05/11/2021  PCP:  Sandi Mariscal, [facility]  Cardiologist:  Shelva Majestic, MD  Advanced Practice Provider:  No care team member to display Electrophysiologist:  None   Chief Complaint    Debbie [name] is a 76 y.o. female with a hx of  CAD s/p PCI, diabetes mellitus without complication, stroke, essential hypertension, hyperlipidemia with LDL goal less than 70, and mild obesity presents today for 2 month follow-up.  Ms. Bowler has had known CAD and in 2001 suffered an acute coronary syndrome was found to have occlusion of her RCA.  She had 3 stents placed to her RCA.  Subsequent catheterization in 2006 showed 3 stents widely patent.  She also has a history of atrial tachycardia.  A nuclear perfusion study in September 2013 showed minimal anterior thinning not felt to be significant.  Because of possible claudication symptoms she underwent a lower extremity arterial Doppler study which suggested mild arterial insufficiency.  She was seen in September 2014 and was diagnosed with type 2 diabetes mellitus.  She was evaluated September 2017 by Kerin Ransom after experiencing left-sided chest pressure which was nonexertional and happened at night.  It did not radiate to arms, jaw, or back.  She denies any other associated symptoms.  Nuclear perfusion study was done 01/2016, this study was normal with ejection fraction of 58%.  She has been followed the last few years by Dr. Claiborne Billings. When she was last seen 02/2021. He has been working on aggressive lipid management. A lipid panel was ordered at that time. Ramipril was stopped and Olmersartan was started.   Today, she feels good without any chest pain or shortness of breath.  She has been compliant on all her medications.  She is currently on Caduet as well as Zetia for her cholesterol.  She admits that her diet could be more better controlled.  We have provided her  with some education on a lipid-lowering diet.  Blood pressure was also slightly elevated in the clinic today 148/76.  She does take her blood pressure at home and it usually runs 128/70.  She will continue to take it at home and let us know if she is consistently above 509 systolic.  We discussed repeating a lipid panel in 2 months and if her LDL remains above 70 we will probably refer her to the lipid clinic for tighter management.  Reports no shortness of breath nor dyspnea on exertion. Reports no chest pain, pressure, or tightness. No edema, orthopnea, PND. Reports no palpitations.      Past Medical History    Past Medical History:  Diagnosis Date   Arthritis    CAD (coronary artery disease) Aug 2001   RCA stent X3, patent '06. Nuc low risk 9/13   Cataract    beginning stages   Diabetes mellitus without complication ([place])    History of stress test 01/08/2012   Showed minimal anterior thinning not felt to be significant.   Hx of echocardiogram 12/12/2011   EF>55%   Hypercholesteremia    Hypertension    Myocardial infarction [facility])    18 years ago    age 81   PAF (paroxysmal atrial fibrillation) [facility]) Aug 2013   SSS component with some bradycardia   Stroke [facility]) 1998   Vitamin D deficiency    Past Surgical History:  Procedure Laterality Date   CATARACT EXTRACTION, BILATERAL  CHOLECYSTECTOMY     COLONOSCOPY     CORONARY ANGIOGRAM  May 2006   patent stents   CORONARY ANGIOPLASTY WITH STENT PLACEMENT  Aug 2001   X 3   EYE SURGERY     MULTIPLE TOOTH EXTRACTIONS     ROBOTIC ASSISTED TOTAL HYSTERECTOMY WITH BILATERAL SALPINGO OOPHERECTOMY Bilateral 03/29/2020   Procedure: XI ROBOTIC ASSISTED TOTAL HYSTERECTOMY WITH BILATERAL SALPINGO OOPHORECTOMY;  Surgeon: Christophe Louis, MD;  Location: [facility];  Service: Gynecology;  Laterality: Bilateral;  Tracie to RNFA confirmed on 02/16/20 CS    Allergies  No Known Allergies    EKGs/Labs/Other Studies Reviewed:   The following studies  were reviewed today:  01/19/2016 Stress test:   The left ventricular ejection fraction is normal (55-65%). There was no ST segment deviation noted during stress. The study is normal. This is a low risk study.   Normal resting and stress perfusion. No ischemia or infarction EF 58%   EKG:  EKG is not ordered today.   Recent Labs: 03/28/2021: ALT 20; BUN 15; Creatinine, Ser 0.90; Hemoglobin 12.2; Platelets 339; Potassium 3.6; Sodium 140; TSH 0.926  Recent Lipid Panel    Component Value Date/Time   CHOL 154 03/28/2021 0947   TRIG 111 03/28/2021 0947   HDL 51 03/28/2021 0947   CHOLHDL 3.0 03/28/2021 0947   CHOLHDL 2.3 01/18/2016 0823   VLDL 22 01/18/2016 0823   LDLCALC 83 03/28/2021 0947   Home Medications   Current Meds  Medication Sig   acetaminophen (TYLENOL) 500 MG tablet Take 2 tablets (1,000 mg total) by mouth every 8 (eight) hours as needed.   amLODipine-atorvastatin (CADUET) 10-80 MG tablet Take 1 tablet by mouth in the evening   aspirin EC 81 MG tablet Take 1 tablet (81 mg total) by mouth daily.   ezetimibe (ZETIA) 10 MG tablet Take 1 tablet by mouth once daily   furosemide (LASIX) 20 MG tablet Take 1 tablet by mouth once daily (Patient taking differently: Take 20 mg by mouth daily.)   metoprolol succinate (TOPROL-XL) 50 MG 24 hr tablet TAKE 1 TABLET BY MOUTH ONCE DAILY IMMEDIATELY FOLLOWING A MEAL   Multiple Vitamins-Minerals (CENTRUM SILVER ADULT 50+) TABS Take 1 tablet by mouth daily.   nitroGLYCERIN (NITROSTAT) 0.4 MG SL tablet Place 1 tablet (0.4 mg total) under the tongue every 5 (five) minutes as needed for chest pain.   olmesartan (BENICAR) 20 MG tablet Take 1 tablet (20 mg total) by mouth daily.   oxymetazoline (AFRIN) 0.05 % nasal spray Place 1 spray into both nostrils 2 (two) times daily as needed for congestion.   Current Facility-Administered Medications for the 05/11/21 encounter (Office Visit) with Deberah Pelton, NP  Medication   0.9 %  sodium chloride  infusion     Review of Systems      All other systems reviewed and are otherwise negative except as noted above.  Physical Exam    VS:  BP (!) 148/76    Pulse 60    Ht 5' 5.5" (1.664 m)    Wt 197 lb 6.4 oz (89.5 kg)    SpO2 95%    BMI 32.35 kg/m  , BMI Body mass index is 32.35 kg/m.  Wt Readings from Last 3 Encounters:  05/11/21 197 lb 6.4 oz (89.5 kg)  03/13/21 199 lb (90.3 kg)  03/29/20 198 lb (89.8 kg)     GEN: Well nourished, well developed, in no acute distress. HEENT: normal. Neck: Supple, no JVD, carotid bruits, or masses.  Cardiac: RRR, no murmurs, rubs, or gallops. No clubbing, cyanosis, edema.  Radials/PT 2+ and equal bilaterally.  Respiratory:  Respirations regular and unlabored, clear to auscultation bilaterally. GI: Soft, nontender, nondistended. MS: No deformity or atrophy. Skin: Warm and dry, no rash. Neuro:  Strength and sensation are intact. Psych: Normal affect.  Assessment & Plan    Essential hypertension -Slightly elevated today in the clinic -Usually she is well controlled at home, we encouraged her to continue taking her blood pressure at home and she will let us know if it is consistently above 110 systolic -Continue Toprol XL 50 mg daily, Benicar 20 mg daily  CAD status post PCI -No further chest pain -Continue GDMT: Aspirin 81 mg daily, Zetia 10 mg daily, Caduet 10/80 mg daily, Toprol-XL 50 mg daily, and Benicar 20 mg daily   Hyperlipidemia with target LDL less than 70 -Recent lipid panel 12/22 shows total cholesterol 134, LDL 83, HDL 51, triglycerides 111 -Her LDL remains above goal -We have discussed dietary changes and plan to redraw lipid panel 8 weeks -We will refer to the lipid clinic if her LDL remains above 70  Mild obesity -Continue dietary changes -Increase exercise   Disposition: Follow up 6 months with Shelva Majestic, MD or APP.  Signed, Elgie Collard, PA-C 05/11/2021, 9:18 AM [place]

## 2021-05-11 ENCOUNTER — Other Ambulatory Visit: Payer: Self-pay

## 2021-05-11 ENCOUNTER — Encounter: Payer: Self-pay | Admitting: General Practice

## 2021-05-11 ENCOUNTER — Ambulatory Visit: Payer: Medicare PPO | Admitting: Physician Assistant

## 2021-05-11 VITALS — BP 148/76 | HR 60 | Ht 65.5 in | Wt 197.4 lb

## 2021-05-11 DIAGNOSIS — I2510 Atherosclerotic heart disease of native coronary artery without angina pectoris: Secondary | ICD-10-CM

## 2021-05-11 DIAGNOSIS — Z79899 Other long term (current) drug therapy: Secondary | ICD-10-CM | POA: Diagnosis not present

## 2021-05-11 DIAGNOSIS — E785 Hyperlipidemia, unspecified: Secondary | ICD-10-CM

## 2021-05-11 DIAGNOSIS — E669 Obesity, unspecified: Secondary | ICD-10-CM

## 2021-05-11 DIAGNOSIS — Z9861 Coronary angioplasty status: Secondary | ICD-10-CM

## 2021-05-11 DIAGNOSIS — I10 Essential (primary) hypertension: Secondary | ICD-10-CM

## 2021-05-11 NOTE — Patient Instructions (Signed)
Medication Instructions:  The current medical regimen is effective;  continue present plan and medications as directed. Please refer to the Current Medication list given to you today.   *If you need a refill on your cardiac medications before your next appointment, please call your pharmacy*  Lab Work:    FASTING LIPID PANEL IN 8 WEEKS      Special Instructions [place]  Follow-Up: Your next appointment:  6 month(s) In Person with Shelva Majestic, MD OR ANY APP  Please call our office 2 months in advance to schedule this appointment If MD is not listed, click here to update    :1  At [facility], you and your health needs are our priority.  As part of our continuing mission to provide you with exceptional heart care, we have created designated Provider Care Teams.  These Care Teams include your primary Cardiologist (physician) and Advanced Practice Providers (APPs -  Physician Assistants and Nurse Practitioners) who all work together to provide you with the care you need, when you need it.            6 SALTY THINGS TO AVOID     1,800MG  DAILY     Heart-Healthy Eating Plan Many factors influence your heart (coronary) health, including eating and exercise habits. Coronary risk increases with abnormal blood fat (lipid) levels. Heart-healthy meal planning includes limiting unhealthy fats, increasing healthy fats, and making other diet and lifestyle changes. What is my plan? What are tips for following this plan? Cooking Cook foods using methods other than frying. Baking, boiling, grilling, and broiling are all good options. Other ways to reduce fat include: Removing the skin from poultry. Removing all visible fats from meats. Steaming vegetables in water or broth. Meal planning  At meals, imagine dividing your plate into fourths: Fill one-half of your plate with vegetables and green salads. Fill one-fourth of your plate with whole  grains. Fill one-fourth of your plate with lean protein foods. Eat 4-5 servings of vegetables per day. One serving equals 1 cup raw or cooked vegetable, or 2 cups raw leafy greens. Eat 4-5 servings of fruit per day. One serving equals 1 medium whole fruit,  cup dried fruit,  cup fresh, frozen, or canned fruit, or  cup 100% fruit juice. Eat more foods that contain soluble fiber. Examples include apples, broccoli, carrots, beans, peas, and barley. Aim to get 25-30 g of fiber per day. Increase your consumption of legumes, nuts, and seeds to 4-5 servings per week. One serving of dried beans or legumes equals  cup cooked, 1 serving of nuts is  cup, and 1 serving of seeds equals 1 tablespoon. Fats Choose healthy fats more often. Choose monounsaturated and polyunsaturated fats, such as olive and canola oils, flaxseeds, walnuts, almonds, and seeds. Eat more omega-3 fats. Choose salmon, mackerel, sardines, tuna, flaxseed oil, and ground flaxseeds. Aim to eat fish at least 2 times each week. Check food labels carefully to identify foods with trans fats or high amounts of saturated fat. Limit saturated fats. These are found in animal products, such as meats, butter, and cream. Plant sources of saturated fats include palm oil, palm kernel oil, and coconut oil. Avoid foods with partially hydrogenated oils in them. These contain trans fats. Examples are stick margarine, some tub margarines, cookies, crackers, and other baked goods. Avoid fried foods. General information Eat more home-cooked food and less restaurant, buffet, and fast food. Limit or avoid alcohol. Limit foods that are  high in starch and sugar. Lose weight if you are overweight. Losing just 5-10% of your body weight can help your overall health and prevent diseases such as diabetes and heart disease. Monitor your salt (sodium) intake, especially if you have high blood pressure. Talk with your health care provider about your sodium intake. Try  to incorporate more vegetarian meals weekly. What foods can I eat? Fruits All fresh, canned (in natural juice), or frozen fruits. Vegetables Fresh or frozen vegetables (raw, steamed, roasted, or grilled). Green salads. Grains Most grains. Choose whole wheat and whole grains most of the time. Rice and pasta, including brown rice and pastas made with whole wheat. Meats and other proteins Lean, well-trimmed beef, veal, pork, and lamb. Chicken and [place] without skin. All fish and shellfish. Wild duck, rabbit, pheasant, and venison. Egg whites or low-cholesterol egg substitutes. Dried beans, peas, lentils, and tofu. Seeds and most nuts. Dairy Low-fat or nonfat cheeses, including ricotta and mozzarella. Skim or 1% milk (liquid, powdered, or evaporated). Buttermilk made with low-fat milk. Nonfat or low-fat yogurt. Fats and oils Non-hydrogenated (trans-free) margarines. Vegetable oils, including soybean, sesame, sunflower, olive, peanut, safflower, corn, canola, and cottonseed. Salad dressings or mayonnaise made with a vegetable oil. Beverages Water (mineral or sparkling). Coffee and tea. Diet carbonated beverages. Sweets and desserts Sherbet, gelatin, and fruit ice. Small amounts of dark chocolate. Limit all sweets and desserts. Seasonings and condiments All seasonings and condiments. The items listed above may not be a complete list of foods and beverages you can eat. Contact a dietitian for more options. What foods are not recommended? Fruits Canned fruit in heavy syrup. Fruit in cream or butter sauce. Fried fruit. Limit coconut. Vegetables Vegetables cooked in cheese, cream, or butter sauce. Fried vegetables. Grains Breads made with saturated or trans fats, oils, or whole milk. Croissants. Sweet rolls. Donuts. High-fat crackers, such as cheese crackers. Meats and other proteins Fatty meats, such as hot dogs, ribs, sausage, bacon, rib-eye roast or steak. High-fat deli meats, such as salami  and bologna. Caviar. Domestic duck and goose. Organ meats, such as liver. Dairy Cream, sour cream, cream cheese, and creamed cottage cheese. Whole-milk cheeses. Whole or 2% milk (liquid, evaporated, or condensed). Whole buttermilk. Cream sauce or high-fat cheese sauce. Whole-milk yogurt. Fats and oils Meat fat, or shortening. Cocoa butter, hydrogenated oils, palm oil, coconut oil, palm kernel oil. Solid fats and shortenings, including bacon fat, salt pork, lard, and butter. Nondairy cream substitutes. Salad dressings with cheese or sour cream. Beverages Regular sodas and any drinks with added sugar. Sweets and desserts Frosting. Pudding. Cookies. Cakes. Pies. Milk chocolate or white chocolate. Buttered syrups. Full-fat ice cream or ice cream drinks. The items listed above may not be a complete list of foods and beverages to avoid. Contact a dietitian for more information. Summary Heart-healthy meal planning includes limiting unhealthy fats, increasing healthy fats, and making other diet and lifestyle changes. Lose weight if you are overweight. Losing just 5-10% of your body weight can help your overall health and prevent diseases such as diabetes and heart disease. Focus on eating a balance of foods, including fruits and vegetables, low-fat or nonfat dairy, lean protein, nuts and legumes, whole grains, and heart-healthy oils and fats. This information is not intended to replace advice given to you by your health care provider. Make sure you discuss any questions you have with your health care provider. Document Revised: 08/10/2020 Document Reviewed: 08/10/2020 Elsevier Patient Education  2022 [employer].

## 2021-06-13 ENCOUNTER — Telehealth: Payer: Self-pay | Admitting: *Deleted

## 2021-06-13 NOTE — Telephone Encounter (Signed)
? ?  Pre-operative Risk Assessment  ?  ?Patient Name: Debbie [name]  ?DOB: January 12, 1946 ?MRN: [id number]  ? ?  ? ?Request for Surgical Clearance   ? ?Procedure:   L3-4,L4-5 lumbar fusion  ? ?Date of Surgery:  Clearance TBD                              ?   ?Surgeon:   Eustace Moore ?Surgeon's Group or Practice Name:   [facility]  ?Phone number:   [place] ?Fax number:  [phone]  ?  ?Type of Clearance Requested:   ?- Medical  ?- Pharmacy:  Hold Aspirin   ?  ?Type of Anesthesia:  General  ?  ?Additional requests/questions:  Please advise surgeon/provider what medications should be held. ? ?Signed, ?Trixie Dredge V   ?06/13/2021, 3:19 PM   ?

## 2021-06-13 NOTE — Telephone Encounter (Signed)
? ?  Primary Cardiologist: Shelva Majestic, MD ? ?Chart reviewed as part of pre-operative protocol coverage. Given past medical history and time since last visit, based on ACC/AHA guidelines, Debbie [name] would be at acceptable risk for the planned procedure without further cardiovascular testing.  ? ?I saw her 05/11/21 in the clinic and she was doing well at that time. Remote hx of PCI (2001) .  Myoview back in 2017 normal. On the phone today she does not endorse any new cardiac symptoms. She meets the minimum requirements of 4 METs.  ? ?Okay to hold aspirin 81 mg x 7 days for upcoming lumbar fusion.  ? ?Patient was advised that if she develops new symptoms prior to surgery to contact our office to arrange a follow-up appointment.  She verbalized understanding. ? ?I will route this recommendation to the requesting party via Epic fax function and remove from pre-op pool. ? ?Please call with questions. ? ?Elgie Collard, PA-C ? ?06/13/2021, 5:01 PM ? ? ? ?

## 2021-06-14 ENCOUNTER — Other Ambulatory Visit: Payer: Self-pay | Admitting: Neurological Surgery

## 2021-06-14 ENCOUNTER — Other Ambulatory Visit: Payer: Self-pay | Admitting: Cardiovascular Disease

## 2021-06-29 LAB — LIPID PANEL
Chol/HDL Ratio: 2.3 ratio (ref 0.0–4.4)
Cholesterol, Total: 143 mg/dL (ref 100–199)
HDL: 62 mg/dL (ref >39)
LDL Chol Calc (NIH): 63 mg/dL (ref 0–99)
Triglycerides: 95 mg/dL (ref 0–149)
VLDL Cholesterol Cal: 18 mg/dL (ref 5–40)

## 2021-06-29 NOTE — Progress Notes (Signed)
Surgical Instructions ? ? ? Your procedure is scheduled on Wednesday, March 22nd, 2023. ? ? Report to [facility] Main Entrance "A" at 08:30 A.M., then check in with the Admitting office. ? Call this number if you have problems the morning of surgery: ? [phone] ? ? If you have any questions prior to your surgery date call [phone]: Open Monday-Friday 8am-4pm ? ? ? Remember: ? Do not eat or drink after midnight the night before your surgery ?  ? Take these medicines the morning of surgery with A SIP OF WATER:  ? ?ezetimibe (ZETIA)  ?gabapentin (NEURONTIN) ?metoprolol succinate (TOPROL-XL)  ?MYRBETRIQ  ? ?If needed: ? ?acetaminophen (TYLENOL)  ?nitroGLYCERIN (NITROSTAT) ? ? ?Hold Aspirin until after surgery, per MD. ? ?As of today, STOP taking any Aspirin (unless otherwise instructed by your surgeon) Aleve, Naproxen, Ibuprofen, Motrin, Advil, Goody's, BC's, all herbal medications, fish oil, and all vitamins. ? ? ?WHAT DO I DO ABOUT MY DIABETES MEDICATION? ? ? ?Do not take metFORMIN (GLUCOPHAGE) the morning of surgery. ? ? ?HOW TO MANAGE YOUR DIABETES ?BEFORE AND AFTER SURGERY ? ?Why is it important to control my blood sugar before and after surgery? ?Improving blood sugar levels before and after surgery helps healing and can limit problems. ?A way of improving blood sugar control is eating a healthy diet by: ? Eating less sugar and carbohydrates ? Increasing activity/exercise ? Talking with your doctor about reaching your blood sugar goals ?High blood sugars (greater than 180 mg/dL) can raise your risk of infections and slow your recovery, so you will need to focus on controlling your diabetes during the weeks before surgery. ?Make sure that the doctor who takes care of your diabetes knows about your planned surgery including the date and location. ? ?How do I manage my blood sugar before surgery? ?Check your blood sugar at least 4 times a day, starting 2 days before surgery, to make sure that the level is not  too high or low. ? ?Check your blood sugar the morning of your surgery when you wake up and every 2 hours until you get to the Short Stay unit. ? ?If your blood sugar is less than 70 mg/dL, you will need to treat for low blood sugar: ?Do not take insulin. ?Treat a low blood sugar (less than 70 mg/dL) with ? cup of clear juice (cranberry or apple), 4 glucose tablets, OR glucose gel. ?Recheck blood sugar in 15 minutes after treatment (to make sure it is greater than 70 mg/dL). If your blood sugar is not greater than 70 mg/dL on recheck, call [phone] for further instructions. ?Report your blood sugar to the short stay nurse when you get to Short Stay. ? ?If you are admitted to the hospital after surgery: ?Your blood sugar will be checked by the staff and you will probably be given insulin after surgery (instead of oral diabetes medicines) to make sure you have good blood sugar levels. ?The goal for blood sugar control after surgery is 80-180 mg/dL.  ? ? ? The day of surgery: ?         ?Do not wear jewelry or makeup ?Do not wear lotions, powders, perfumes, or deodorant. ?Do not shave 48 hours prior to surgery.   ?Do not bring valuables to the hospital. ?Do not wear nail polish, gel polish, artificial nails, or any other type of covering on natural nails (fingers and toes) ?If you have artificial nails or gel coating that need to be removed by a nail  salon, please have this removed prior to surgery. Artificial nails or gel coating may interfere with anesthesia's ability to adequately monitor your vital signs. ? ? ?[facility] is not responsible for any belongings or valuables. .  ? ?Do NOT Smoke (Tobacco/Vaping)  24 hours prior to your procedure ? ?If you use a CPAP at night, you may bring your mask for your overnight stay. ?  ?Contacts, glasses, hearing aids, dentures or partials may not be worn into surgery, please bring cases for these belongings ?  ?For patients admitted to the hospital, discharge time will be  determined by your treatment team. ?  ?Patients discharged the day of surgery will not be allowed to drive home, and someone needs to stay with them for 24 hours. ? ?NO VISITORS WILL BE ALLOWED IN PRE-OP WHERE PATIENTS ARE PREPPED FOR SURGERY.  ONLY 1 SUPPORT PERSON MAY BE PRESENT IN THE WAITING ROOM WHILE YOU ARE IN SURGERY.  IF YOU ARE TO BE ADMITTED, ONCE YOU ARE IN YOUR ROOM YOU WILL BE ALLOWED TWO (2) VISITORS. 1 (ONE) VISITOR MAY STAY OVERNIGHT BUT MUST ARRIVE TO THE ROOM BY 8pm.  Minor children may have two parents present. Special consideration for safety and communication needs will be reviewed on a case by case basis. ? ?Special instructions:   ? ?Oral Hygiene is also important to reduce your risk of infection.  Remember - BRUSH YOUR TEETH THE MORNING OF SURGERY WITH YOUR REGULAR TOOTHPASTE ? ? ?[facility]- Preparing For Surgery ? ?Before surgery, you can play an important role. Because skin is not sterile, your skin needs to be as free of germs as possible. You can reduce the number of germs on your skin by washing with CHG (chlorahexidine gluconate) Soap before surgery.  CHG is an antiseptic cleaner which kills germs and bonds with the skin to continue killing germs even after washing.   ? ? ?Please do not use if you have an allergy to CHG or antibacterial soaps. If your skin becomes reddened/irritated stop using the CHG.  ?Do not shave (including legs and underarms) for at least 48 hours prior to first CHG shower. It is OK to shave your face. ? ?Please follow these instructions carefully. ?  ? ? Shower the NIGHT BEFORE SURGERY and the MORNING OF SURGERY with CHG Soap.  ? If you chose to wash your hair, wash your hair first as usual with your normal shampoo. After you shampoo, rinse your hair and body thoroughly to remove the shampoo.  Then [employer] and genitals (private parts) with your normal soap and rinse thoroughly to remove soap. ? ?After that Use CHG Soap as you would any other liquid soap. You  can apply CHG directly to the skin and wash gently with a scrungie or a clean washcloth.  ? ?Apply the CHG Soap to your body ONLY FROM THE NECK DOWN.  Do not use on open wounds or open sores. Avoid contact with your eyes, ears, mouth and genitals (private parts). Wash Face and genitals (private parts)  with your normal soap.  ? ?Wash thoroughly, paying special attention to the area where your surgery will be performed. ? ?Thoroughly rinse your body with warm water from the neck down. ? ?DO NOT shower/wash with your normal soap after using and rinsing off the CHG Soap. ? ?Pat yourself dry with a CLEAN TOWEL. ? ?Wear CLEAN PAJAMAS to bed the night before surgery ? ?Place CLEAN SHEETS on your bed the night before your surgery ? ?  DO NOT SLEEP WITH PETS. ? ? ?Day of Surgery: ? ?Take a shower with CHG soap. ?Wear Clean/Comfortable clothing the morning of surgery ?Do not apply any deodorants/lotions.   ?Remember to brush your teeth WITH YOUR REGULAR TOOTHPASTE. ? ? ? ?COVID testing OR BEFORE SURGERY ? ?If you are going to stay overnight or be admitted after your procedure/surgery and require a pre-op COVID test, please follow these instructions after your COVID test  ? ?You are not required to quarantine however you are required to wear a well-fitting mask when you are out and around people not in your household.  If your mask becomes wet or soiled, replace with a new one. ? ?Wash your hands often with soap and water for 20 seconds or clean your hands with an alcohol-based hand sanitizer that contains at least 60% alcohol. ? ?Do not share personal items. ? ?Notify your provider: ?if you are in close contact with someone who has COVID  ?or if you develop a fever of 100.4 or greater, sneezing, cough, sore throat, shortness of breath or body aches. ? ?  ?Please read over the following fact sheets that you were given.   ?

## 2021-07-02 ENCOUNTER — Encounter (HOSPITAL_COMMUNITY): Payer: Self-pay

## 2021-07-02 ENCOUNTER — Encounter (HOSPITAL_COMMUNITY)
Admission: RE | Admit: 2021-07-02 | Discharge: 2021-07-02 | Disposition: A | Discharge status: Home / Self Care | Payer: Medicare PPO | Source: Ambulatory Visit | Attending: Neurological Surgery | Admitting: Neurological Surgery

## 2021-07-02 ENCOUNTER — Other Ambulatory Visit: Payer: Self-pay

## 2021-07-02 VITALS — BP 150/69 | HR 65 | Temp 98.4°F | Resp 17 | Ht 65.0 in | Wt 196.9 lb

## 2021-07-02 DIAGNOSIS — Z01818 Encounter for other preprocedural examination: Secondary | ICD-10-CM

## 2021-07-02 DIAGNOSIS — E785 Hyperlipidemia, unspecified: Secondary | ICD-10-CM | POA: Insufficient documentation

## 2021-07-02 DIAGNOSIS — E119 Type 2 diabetes mellitus without complications: Secondary | ICD-10-CM | POA: Insufficient documentation

## 2021-07-02 DIAGNOSIS — Z01812 Encounter for preprocedural laboratory examination: Secondary | ICD-10-CM | POA: Insufficient documentation

## 2021-07-02 DIAGNOSIS — I639 Cerebral infarction, unspecified: Secondary | ICD-10-CM | POA: Insufficient documentation

## 2021-07-02 DIAGNOSIS — Z794 Long term (current) use of insulin: Secondary | ICD-10-CM | POA: Insufficient documentation

## 2021-07-02 DIAGNOSIS — I2510 Atherosclerotic heart disease of native coronary artery without angina pectoris: Secondary | ICD-10-CM | POA: Insufficient documentation

## 2021-07-02 DIAGNOSIS — I10 Essential (primary) hypertension: Secondary | ICD-10-CM | POA: Insufficient documentation

## 2021-07-02 HISTORY — DX: Cardiac arrhythmia, unspecified: I49.9

## 2021-07-02 LAB — BASIC METABOLIC PANEL
Anion gap: 12 (ref 5–15)
BUN: 10 mg/dL (ref 8–23)
CO2: 21 mmol/L — ABNORMAL LOW (ref 22–32)
Calcium: 9.8 mg/dL (ref 8.9–10.3)
Chloride: 108 mmol/L (ref 98–111)
Creatinine, Ser: 1.02 mg/dL — ABNORMAL HIGH (ref 0.44–1.00)
GFR, Estimated: 57 mL/min — ABNORMAL LOW (ref >60)
Glucose, Bld: 139 mg/dL — ABNORMAL HIGH (ref 70–99)
Potassium: 3.2 mmol/L — ABNORMAL LOW (ref 3.5–5.1)
Sodium: 141 mmol/L (ref 135–145)

## 2021-07-02 LAB — PROTIME-INR
INR: 1.1 (ref 0.8–1.2)
Prothrombin Time: 13.9 seconds (ref 11.4–15.2)

## 2021-07-02 LAB — GLUCOSE, CAPILLARY: Glucose-Capillary: 145 mg/dL — ABNORMAL HIGH (ref 70–99)

## 2021-07-02 LAB — CBC
HCT: 41.2 % (ref 36.0–46.0)
Hemoglobin: 13.5 g/dL (ref 12.0–15.0)
MCH: 30.6 pg (ref 26.0–34.0)
MCHC: 32.8 g/dL (ref 30.0–36.0)
MCV: 93.4 fL (ref 80.0–100.0)
Platelets: 291 10*3/uL (ref 150–400)
RBC: 4.41 MIL/uL (ref 3.87–5.11)
RDW: 13.5 % (ref 11.5–15.5)
WBC: 8.2 10*3/uL (ref 4.0–10.5)
nRBC: 0 % (ref 0.0–0.2)

## 2021-07-02 LAB — HEMOGLOBIN A1C
Hgb A1c MFr Bld: 7.1 % — ABNORMAL HIGH (ref 4.8–5.6)
Mean Plasma Glucose: 157.07 mg/dL

## 2021-07-02 LAB — TYPE AND SCREEN
ABO/RH(D): B POS
Antibody Screen: NEGATIVE

## 2021-07-02 LAB — SURGICAL PCR SCREEN
MRSA, PCR: NEGATIVE
Staphylococcus aureus: NEGATIVE

## 2021-07-02 NOTE — Progress Notes (Signed)
PCP - Sandi Mariscal, MD ?Cardiologist - Shelva Majestic, MD ? ?PPM/ICD - denies ?Device Orders - n/a ?Rep Notified - n/a ? ?Chest x-ray - n/a ?EKG - 03/13/2021 ?Stress Test - 01/19/2016 ?ECHO - 12/12/2011 ?Cardiac Cath - 2002 ? ?Sleep Study - denies ?CPAP - n/a ? ?Fasting Blood Sugar - 100 - 120 ?Checks Blood Sugar once a day ?CBG today - 145 ?A1C - done in PAT om 07/02/2021 ? ?Blood Thinner Instructions: n/a ? ?Aspirin Instructions: last dose - 06/27/2021 per patient ? ?Patient was instructed: As of today, STOP taking any Aspirin (unless otherwise instructed by your surgeon) Aleve, Naproxen, Ibuprofen, Motrin, Advil, Goody's, BC's, all herbal medications, fish oil, and all vitamins. ?  ? ?ERAS Protcol - n/a ? ?COVID TEST- no, patient was tested positive on 04/15/2021 ? ? ?Anesthesia review: yes - cardiac history ? ?Patient denies shortness of breath, fever, cough and chest pain at PAT appointment ? ? ?All instructions explained to the patient, with a verbal understanding of the material. Patient agrees to go over the instructions while at home for a better understanding. Patient also instructed to self quarantine after being tested for COVID-19. The opportunity to ask questions was provided. ?  ?

## 2021-07-03 NOTE — Anesthesia Preprocedure Evaluation (Addendum)
Anesthesia Evaluation  ?Patient identified by MRN, date of birth, ID band ?Patient awake ? ? ? ?Reviewed: ?Allergy & Precautions, NPO status , Patient's Chart, lab work & pertinent test results, Unable to perform ROS - Chart review only ? ?Airway ?Mallampati: III ? ? ? ? ? ? Dental ? ?(+) Edentulous Upper, Edentulous Lower ?  ?Pulmonary ?neg pulmonary ROS, Current Smoker,  ?  ?Pulmonary exam normal ? ? ? ? ? ? ? Cardiovascular ?hypertension, Pt. on medications and Pt. on home beta blockers ?+ CAD, + Past MI and + Cardiac Stents  ?Normal cardiovascular exam+ dysrhythmias  ? ? ?  ?Neuro/Psych ?  ? GI/Hepatic ?negative GI ROS, Neg liver ROS,   ?Endo/Other  ?diabetes, Type 2, Oral Hypoglycemic Agents ? Renal/GU ?negative Renal ROS  ? ?  ?Musculoskeletal ? ?(+) Arthritis ,  ? Abdominal ?(+) + obese,   ?Peds ? Hematology ?negative hematology ROS ?(+)   ?Anesthesia Other Findings ?Study Highlights ? ?? ?? The left ventricular ejection fraction is normal (55-65%). ?? There was no ST segment deviation noted during stress. ?? The study is normal. ?? This is a low risk study. ?  ?Normal resting and stress perfusion. No ischemia or infarction EF ?58%  ?? ? ? Reproductive/Obstetrics ? ?  ? ? ? ? ? ? ? ? ? ? ? ? ? ?  ?  ? ? ? ? ?                                  Anesthesia Evaluation  ?Patient identified by MRN, date of birth, ID band ?Patient awake ? ? ? ?Reviewed: ?Allergy & Precautions, NPO status , Patient's Chart, lab work & pertinent test results, reviewed documented beta blocker date and time  ? ?History of Anesthesia Complications ?Negative for: history of anesthetic complications ? ?Airway ?Mallampati: II ? ?TM Distance: >3 FB ?Neck ROM: Full ? ? ? Dental ? ?(+) Edentulous Upper, Edentulous Lower, Dental Advisory Given ?  ?Pulmonary ?Current Smoker and Patient abstained from smoking.,  ?  ?Pulmonary exam normal ? ? ? ? ? ? ? Cardiovascular ?Exercise Tolerance: Good ?hypertension, Pt. on  medications and Pt. on home beta blockers ?+ CAD, + Past MI and + Cardiac Stents (2006)  ?Normal cardiovascular exam+ dysrhythmias Atrial Fibrillation  ? ? ?  ?Neuro/Psych ?CVA (1998) negative psych ROS  ? GI/Hepatic ?negative GI ROS, Neg liver ROS,   ?Endo/Other  ?diabetes, Type 2 ? Renal/GU ?negative Renal ROS  ?negative genitourinary ?  ?Musculoskeletal ?negative musculoskeletal ROS ?(+)  ? Abdominal ?  ?Peds ? Hematology ?negative hematology ROS ?(+)   ?Anesthesia Other Findings ? ?Nuclear stress test 01/19/2016 showed low risk and no ischemia with an EF of 58%. Last seen 02/16/20, stable at that time from cardiac standpoint, cleared for upcoming surgery. Per note, "Chart reviewed as part of pre-operative protocol coverage. Given past medical history and time since last visit, based on ACC/AHA guidelines, Debbie [name] would be at acceptable risk for the planned procedure without further cardiovascular testing. Her RCRI is a class III risk, 6.6% risk of major cardiac event.  She is able to complete greater than 4 METS of physical activity." ? Reproductive/Obstetrics ? ?  ? ? ? ? ? ? ? ? ? ? ? ? ? ?  ?  ? ? ? ? ? ? ?Anesthesia Physical ?Anesthesia Plan ? ?ASA: III ? ?Anesthesia Plan: General  ? ?  Post-op Pain Management:   ? ?Induction: Intravenous ? ?PONV Risk Score and Plan: 3 and Ondansetron, Dexamethasone, Treatment may vary due to age or medical condition and Midazolam ? ?Airway Management Planned: Oral ETT ? ?Additional Equipment: None ? ?Intra-op Plan:  ? ?Post-operative Plan: Extubation in OR ? ?Informed Consent: I have reviewed the patients History and Physical, chart, labs and discussed the procedure including the risks, benefits and alternatives for the proposed anesthesia with the patient or authorized representative who has indicated his/her understanding and acceptance.  ? ? ? ?Dental advisory given ? ?Plan Discussed with:  ? ?Anesthesia Plan Comments: (PAT note by Karoline Caldwell, PA-C: ?Follows with  cardiology for hx of paroxysmal atrial fibrillation, essential hypertension, CAD (s/p stent x 3 to RCA in 2006), hyperlipidemia, CVA 1998, and bradycardia. Nuclear stress test 01/19/2016 showed low risk and no ischemia with an EF of 58%. Last seen 02/16/20, stable at that time from cardiac standpoint, cleared for upcoming surgery. Per note, "Chart reviewed as part of pre-operative protocol coverage. Given past medical history and time since last visit, based on ACC/AHA guidelines,?Debbie [name]?would be at acceptable risk for the planned procedure without further cardiovascular testing. Her RCRI is a class III risk, 6.6% risk of major cardiac event. ?She is able to complete greater than 4 METS of physical activity." ?? ? Diet controlled DMII. ?Last Labs ?Hgb A1c MFr Bld (%) ?Date Value ?04/28/2019 7.2 (H) ?01/09/2015 7.1 (H) ?12/30/2012 7.0 (H) ? ?? ?? ?Preop labs reviewed, unremarkable.  ?? ?EKG 02/16/20: Sinus bradycardia. Rate 58. Possible inferior infarct, age undetermined.  ?? ?Nuclear stress test 01/19/2016 ?? The left ventricular ejection fraction is normal (55-65%). ?? There was no ST segment deviation noted during stress. ?? The study is normal. ?? This is a low risk study. ?? ?Normal resting and stress perfusion. No ischemia or infarction EF ?58%  ? ?)  ? ? ? ? ? ?Anesthesia Quick Evaluation ? ?Anesthesia Physical ?Anesthesia Plan ? ?ASA: 3 ? ?Anesthesia Plan: General  ? ?Post-op Pain Management: Tylenol PO (pre-op)*  ? ?Induction: Intravenous ? ?PONV Risk Score and Plan: 3 and Ondansetron, Dexamethasone and Treatment may vary due to age or medical condition ? ?Airway Management Planned: Oral ETT ? ?Additional Equipment: None ? ?Intra-op Plan:  ? ?Post-operative Plan: Extubation in OR ? ?Informed Consent: I have reviewed the patients History and Physical, chart, labs and discussed the procedure including the risks, benefits and alternatives for the proposed anesthesia with the patient or authorized  representative who has indicated his/her understanding and acceptance.  ? ? ? ?Dental advisory given ? ?Plan Discussed with: CRNA ? ?Anesthesia Plan Comments: (2 x PIV ? ?PAT note by Karoline Caldwell, PA-C: ?Follows with cardiology for history of CAD s/p PCI 2001, HTN, HLD, CVA 1998. Nuclear stress test 01/19/2016 showed low risk and no ischemia with an EF of 58%.  Preop clearance per telephone encounter 06/13/21, "Chart reviewed as part of pre-operative protocol coverage. Given past medical history and time since last visit, based on ACC/AHA guidelines,?Debbie [name]?would be at acceptable risk for the planned procedure without further cardiovascular testing. I saw her 05/11/21 in the clinic and she was doing well at that time. Remote hx of PCI (2001) . ?Myoview back in 2017 normal. On the phone today she does not endorse any new cardiac symptoms. She meets the minimum requirements of 4 METs. Okay to hold aspirin 81 mg x 7 days for upcoming lumbar fusion." ? ?Non-insulin-dependent DM2, controlled, A1c 7.1 on  preop labs. ? ?Preop labs reviewed, potassium mildly low at 3.2, otherwise unremarkable. ? ?EKG 03/13/2021: Sinus bradycardia with sinus arrhythmia.  Rate 57.  Moderate voltage criteria for LVH, may be normal variant.  Inferior infarct, age undetermined. ? ?Nuclear stress test 01/19/2016 ?? The left ventricular ejection fraction is normal (55-65%). ?? There was no ST segment deviation noted during stress. ?? The study is normal. ?? This is a low risk study. ?? ?Normal resting and stress perfusion. No ischemia or infarction EF ?58%? ? ? ?)  ? ? ? ? ?Anesthesia Quick Evaluation ? ?

## 2021-07-03 NOTE — Progress Notes (Signed)
Anesthesia Chart Review: ? ?Follows with cardiology for history of CAD s/p PCI 2001, HTN, HLD, CVA 1998. Nuclear stress test 01/19/2016 showed low risk and no ischemia with an EF of 58%.  Preop clearance per telephone encounter 06/13/21, "Chart reviewed as part of pre-operative protocol coverage. Given past medical history and time since last visit, based on ACC/AHA guidelines, Debbie [name] would be at acceptable risk for the planned procedure without further cardiovascular testing. I saw her 05/11/21 in the clinic and she was doing well at that time. Remote hx of PCI (2001) .  Myoview back in 2017 normal. On the phone today she does not endorse any new cardiac symptoms. She meets the minimum requirements of 4 METs. Okay to hold aspirin 81 mg x 7 days for upcoming lumbar fusion." ? ?Non-insulin-dependent DM2, controlled, A1c 7.1 on preop labs. ? ?Preop labs reviewed, potassium mildly low at 3.2, otherwise unremarkable. ? ?EKG 03/13/2021: Sinus bradycardia with sinus arrhythmia.  Rate 57.  Moderate voltage criteria for LVH, may be normal variant.  Inferior infarct, age undetermined. ? ?Nuclear stress test 01/19/2016 ?The left ventricular ejection fraction is normal (55-65%). ?There was no ST segment deviation noted during stress. ?The study is normal. ?This is a low risk study. ?  ?Normal resting and stress perfusion. No ischemia or infarction EF ?58%  ? ? ? ?Karoline Caldwell, PA-C ?[facility] Short Stay Center/Anesthesiology ?Phone [phone] ?07/03/2021 10:00 AM ? ?

## 2021-07-04 ENCOUNTER — Other Ambulatory Visit: Payer: Self-pay

## 2021-07-04 ENCOUNTER — Encounter (HOSPITAL_COMMUNITY)
Admission: RE | Disposition: A | Discharge status: Home / Self Care | Payer: Self-pay | Source: Home / Self Care | Attending: Neurological Surgery

## 2021-07-04 ENCOUNTER — Encounter (HOSPITAL_COMMUNITY): Payer: Self-pay | Admitting: Neurological Surgery

## 2021-07-04 ENCOUNTER — Inpatient Hospital Stay (HOSPITAL_COMMUNITY): Payer: Medicare PPO | Admitting: Physician Assistant

## 2021-07-04 ENCOUNTER — Inpatient Hospital Stay (HOSPITAL_COMMUNITY): Payer: Medicare PPO

## 2021-07-04 ENCOUNTER — Inpatient Hospital Stay (HOSPITAL_COMMUNITY)
Admission: RE | Admit: 2021-07-04 | Discharge: 2021-07-05 | DRG: 455 | Disposition: A | Discharge status: Home / Self Care | Payer: Medicare PPO | Attending: Neurological Surgery | Admitting: Neurological Surgery

## 2021-07-04 ENCOUNTER — Inpatient Hospital Stay (HOSPITAL_COMMUNITY): Payer: Medicare PPO | Admitting: Anesthesiology

## 2021-07-04 DIAGNOSIS — M48061 Spinal stenosis, lumbar region without neurogenic claudication: Principal | ICD-10-CM | POA: Diagnosis present

## 2021-07-04 DIAGNOSIS — Z8673 Personal history of transient ischemic attack (TIA), and cerebral infarction without residual deficits: Secondary | ICD-10-CM

## 2021-07-04 DIAGNOSIS — E559 Vitamin D deficiency, unspecified: Secondary | ICD-10-CM | POA: Diagnosis present

## 2021-07-04 DIAGNOSIS — I2510 Atherosclerotic heart disease of native coronary artery without angina pectoris: Secondary | ICD-10-CM | POA: Diagnosis present

## 2021-07-04 DIAGNOSIS — I252 Old myocardial infarction: Secondary | ICD-10-CM | POA: Diagnosis not present

## 2021-07-04 DIAGNOSIS — Z90710 Acquired absence of both cervix and uterus: Secondary | ICD-10-CM | POA: Diagnosis not present

## 2021-07-04 DIAGNOSIS — Z833 Family history of diabetes mellitus: Secondary | ICD-10-CM

## 2021-07-04 DIAGNOSIS — Z6832 Body mass index (BMI) 32.0-32.9, adult: Secondary | ICD-10-CM | POA: Diagnosis not present

## 2021-07-04 DIAGNOSIS — I480 Paroxysmal atrial fibrillation: Secondary | ICD-10-CM | POA: Diagnosis present

## 2021-07-04 DIAGNOSIS — Z20822 Contact with and (suspected) exposure to covid-19: Secondary | ICD-10-CM | POA: Diagnosis present

## 2021-07-04 DIAGNOSIS — I10 Essential (primary) hypertension: Secondary | ICD-10-CM | POA: Diagnosis present

## 2021-07-04 DIAGNOSIS — E669 Obesity, unspecified: Secondary | ICD-10-CM | POA: Diagnosis present

## 2021-07-04 DIAGNOSIS — Z955 Presence of coronary angioplasty implant and graft: Secondary | ICD-10-CM

## 2021-07-04 DIAGNOSIS — F17210 Nicotine dependence, cigarettes, uncomplicated: Secondary | ICD-10-CM | POA: Diagnosis present

## 2021-07-04 DIAGNOSIS — M4316 Spondylolisthesis, lumbar region: Secondary | ICD-10-CM | POA: Diagnosis present

## 2021-07-04 DIAGNOSIS — E7800 Pure hypercholesterolemia, unspecified: Secondary | ICD-10-CM | POA: Diagnosis present

## 2021-07-04 DIAGNOSIS — Z7984 Long term (current) use of oral hypoglycemic drugs: Secondary | ICD-10-CM | POA: Diagnosis not present

## 2021-07-04 DIAGNOSIS — Z7982 Long term (current) use of aspirin: Secondary | ICD-10-CM

## 2021-07-04 DIAGNOSIS — E119 Type 2 diabetes mellitus without complications: Secondary | ICD-10-CM | POA: Diagnosis present

## 2021-07-04 DIAGNOSIS — Z8249 Family history of ischemic heart disease and other diseases of the circulatory system: Secondary | ICD-10-CM | POA: Diagnosis not present

## 2021-07-04 DIAGNOSIS — Z981 Arthrodesis status: Principal | ICD-10-CM

## 2021-07-04 DIAGNOSIS — Z79899 Other long term (current) drug therapy: Secondary | ICD-10-CM | POA: Diagnosis not present

## 2021-07-04 LAB — GLUCOSE, CAPILLARY
Glucose-Capillary: 147 mg/dL — ABNORMAL HIGH (ref 70–99)
Glucose-Capillary: 122 mg/dL — ABNORMAL HIGH (ref 70–99)
Glucose-Capillary: 119 mg/dL — ABNORMAL HIGH (ref 70–99)
Glucose-Capillary: 160 mg/dL — ABNORMAL HIGH (ref 70–99)
Glucose-Capillary: 193 mg/dL — ABNORMAL HIGH (ref 70–99)
Glucose-Capillary: 174 mg/dL — ABNORMAL HIGH (ref 70–99)

## 2021-07-04 IMAGING — RF DG LUMBAR SPINE 2-3V
1 series · 2 of 2 positions shown · non-contrast
Comparison: Lumbar radiographs [DATE] and lumbar MRI [DATE]

CLINICAL DATA: Lumbar fusion L3-4 L4-5

EXAM:
LUMBAR SPINE - 2-3 VIEW

[Series 1: run · 2 of 2 slices shown]
[im 1/2]
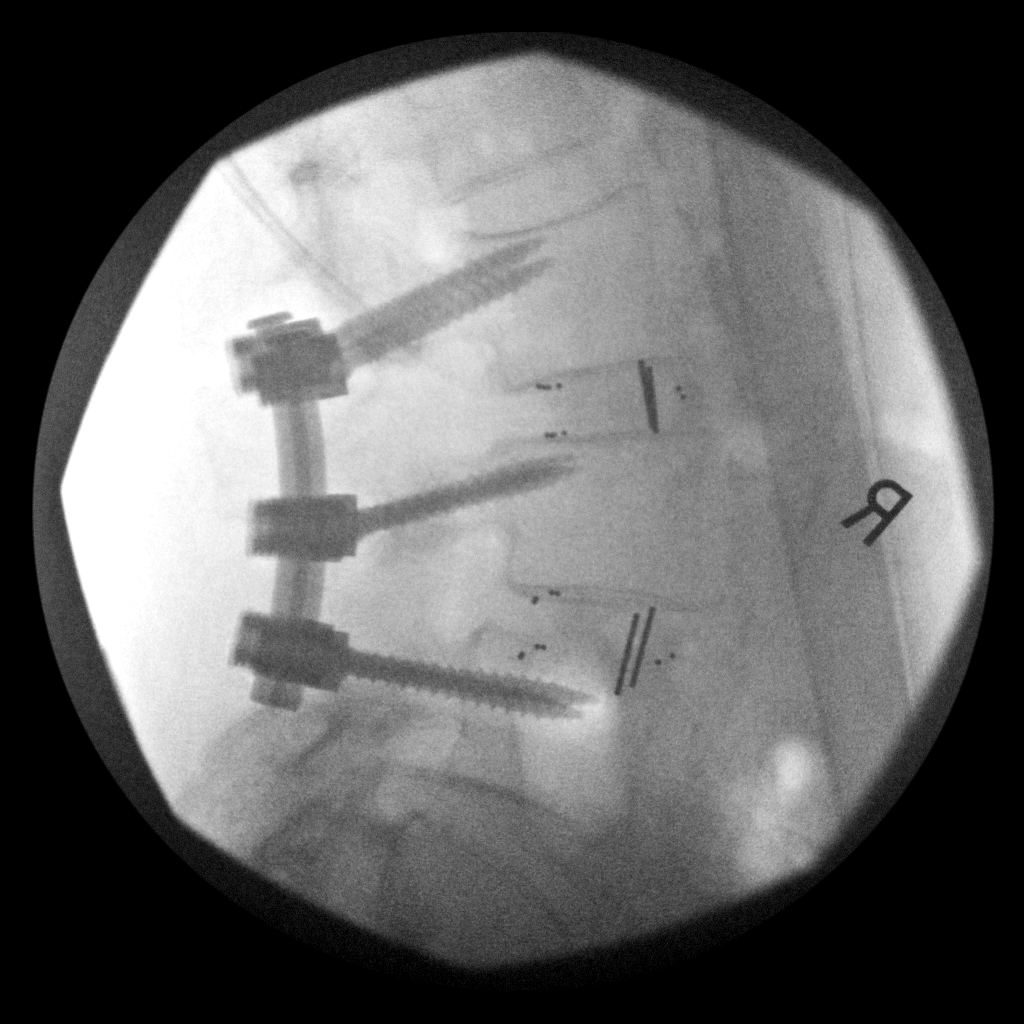
[im 2/2]
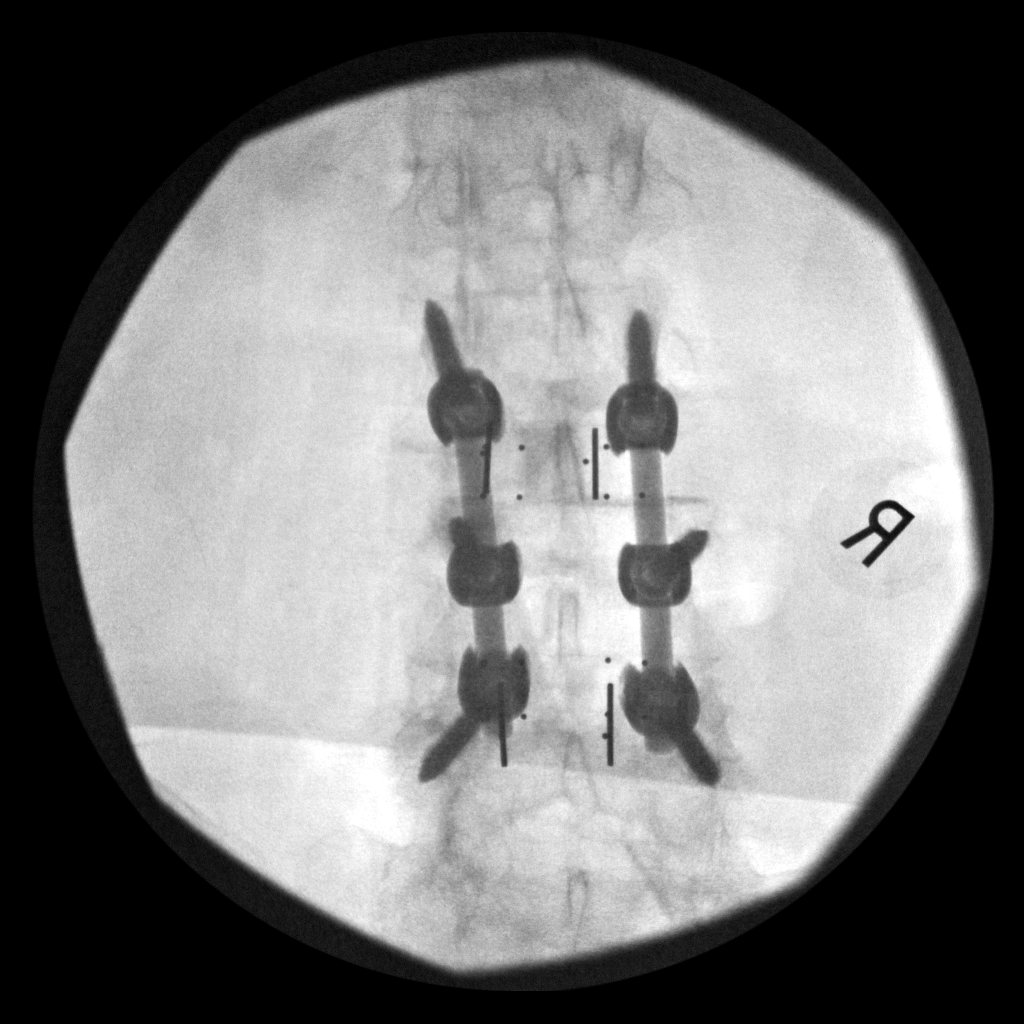

[2 of 2 positions shown; findings below may reference images not displayed]

FINDINGS: AP and lateral C-arm images were obtained of the lumbar spine. 2
level pedicle screw and interbody fusion. Fusion levels appear to be
L3-4 L4-5. Anterolisthesis L4-5 noted.
IMPRESSION: Pedicle screw and interbody fusion L3-4 and L4-5.

## 2021-07-04 SURGERY — POSTERIOR LUMBAR FUSION 2 LEVEL
Anesthesia: General | Site: Back

## 2021-07-04 MED ORDER — ASPIRIN EC 81 MG PO TBEC
81.0000 mg | DELAYED_RELEASE_TABLET | Freq: Every day | ORAL | Status: DC
  Administered 2021-07-04 – 2021-07-05 (×2): 81 mg via ORAL
  Filled 2021-07-04 (×2): qty 1

## 2021-07-04 MED ORDER — THROMBIN 20000 UNITS EX SOLR: CUTANEOUS | Status: DC | PRN

## 2021-07-04 MED ORDER — ONDANSETRON HCL 4 MG/2ML IJ SOLN
INTRAMUSCULAR | Status: DC | PRN
  Administered 2021-07-04: 4 mg via INTRAVENOUS

## 2021-07-04 MED ORDER — PHENYLEPHRINE HCL-NACL 20-0.9 MG/250ML-% IV SOLN
INTRAVENOUS | Status: DC | PRN
  Administered 2021-07-04: 30 ug/min via INTRAVENOUS

## 2021-07-04 MED ORDER — CEFAZOLIN SODIUM-DEXTROSE 2-4 GM/100ML-% IV SOLN
2.0000 g | INTRAVENOUS | Status: AC
  Administered 2021-07-04: 2 g via INTRAVENOUS
  Filled 2021-07-04: qty 100

## 2021-07-04 MED ORDER — 0.9 % SODIUM CHLORIDE (POUR BTL) OPTIME
TOPICAL | Status: DC | PRN
  Administered 2021-07-04: 1000 mL

## 2021-07-04 MED ORDER — PHENYLEPHRINE 40 MCG/ML (10ML) SYRINGE FOR IV PUSH (FOR BLOOD PRESSURE SUPPORT)
PREFILLED_SYRINGE | INTRAVENOUS | Status: AC
  Filled 2021-07-04: qty 20

## 2021-07-04 MED ORDER — ATORVASTATIN CALCIUM 80 MG PO TABS
80.0000 mg | ORAL_TABLET | Freq: Every day | ORAL | Status: DC
  Administered 2021-07-04 – 2021-07-05 (×2): 80 mg via ORAL
  Filled 2021-07-04 (×2): qty 1

## 2021-07-04 MED ORDER — PHENYLEPHRINE 40 MCG/ML (10ML) SYRINGE FOR IV PUSH (FOR BLOOD PRESSURE SUPPORT)
PREFILLED_SYRINGE | INTRAVENOUS | Status: DC | PRN
  Administered 2021-07-04: 80 ug via INTRAVENOUS
  Administered 2021-07-04: 160 ug via INTRAVENOUS
  Administered 2021-07-04: 80 ug via INTRAVENOUS

## 2021-07-04 MED ORDER — SODIUM CHLORIDE 0.9% FLUSH: 3.0000 mL | INTRAVENOUS | Status: DC | PRN

## 2021-07-04 MED ORDER — ACETAMINOPHEN 500 MG PO TABS
1000.0000 mg | ORAL_TABLET | Freq: Once | ORAL | Status: AC
  Administered 2021-07-04: 1000 mg via ORAL
  Filled 2021-07-04: qty 2

## 2021-07-04 MED ORDER — METFORMIN HCL 500 MG PO TABS
500.0000 mg | ORAL_TABLET | Freq: Two times a day (BID) | ORAL | Status: DC
Start: 2021-07-04 — End: 2021-07-05
  Administered 2021-07-04 – 2021-07-05 (×2): 500 mg via ORAL
  Filled 2021-07-04 (×2): qty 1

## 2021-07-04 MED ORDER — AMLODIPINE-ATORVASTATIN 10-80 MG PO TABS: 1.0000 | ORAL_TABLET | Freq: Every evening | ORAL | Status: DC

## 2021-07-04 MED ORDER — EZETIMIBE 10 MG PO TABS
10.0000 mg | ORAL_TABLET | Freq: Every day | ORAL | Status: DC
  Administered 2021-07-04 – 2021-07-05 (×2): 10 mg via ORAL
  Filled 2021-07-04 (×2): qty 1

## 2021-07-04 MED ORDER — LIDOCAINE 2% (20 MG/ML) 5 ML SYRINGE
INTRAMUSCULAR | Status: AC
  Filled 2021-07-04: qty 5

## 2021-07-04 MED ORDER — CHLORHEXIDINE GLUCONATE CLOTH 2 % EX PADS: 6.0000 | MEDICATED_PAD | Freq: Once | CUTANEOUS | Status: DC

## 2021-07-04 MED ORDER — FENTANYL CITRATE (PF) 250 MCG/5ML IJ SOLN
INTRAMUSCULAR | Status: AC
  Filled 2021-07-04: qty 5

## 2021-07-04 MED ORDER — METHOCARBAMOL 1000 MG/10ML IJ SOLN
500.0000 mg | Freq: Four times a day (QID) | INTRAVENOUS | Status: DC | PRN
  Filled 2021-07-04: qty 5

## 2021-07-04 MED ORDER — LIDOCAINE 2% (20 MG/ML) 5 ML SYRINGE
INTRAMUSCULAR | Status: DC | PRN
  Administered 2021-07-04: 80 mg via INTRAVENOUS

## 2021-07-04 MED ORDER — BUPIVACAINE HCL (PF) 0.25 % IJ SOLN
INTRAMUSCULAR | Status: DC | PRN
  Administered 2021-07-04: 5 mL

## 2021-07-04 MED ORDER — CEFAZOLIN SODIUM-DEXTROSE 2-4 GM/100ML-% IV SOLN
2.0000 g | Freq: Three times a day (TID) | INTRAVENOUS | Status: AC
  Administered 2021-07-04 – 2021-07-05 (×2): 2 g via INTRAVENOUS
  Filled 2021-07-04 (×2): qty 100

## 2021-07-04 MED ORDER — METHOCARBAMOL 500 MG PO TABS: 500.0000 mg | ORAL_TABLET | Freq: Four times a day (QID) | ORAL | Status: DC | PRN

## 2021-07-04 MED ORDER — SODIUM CHLORIDE 0.9% FLUSH
3.0000 mL | Freq: Two times a day (BID) | INTRAVENOUS | Status: DC
  Administered 2021-07-04: 3 mL via INTRAVENOUS

## 2021-07-04 MED ORDER — ROCURONIUM BROMIDE 10 MG/ML (PF) SYRINGE
PREFILLED_SYRINGE | INTRAVENOUS | Status: AC
  Filled 2021-07-04: qty 10

## 2021-07-04 MED ORDER — PROPOFOL 10 MG/ML IV BOLUS
INTRAVENOUS | Status: DC | PRN
  Administered 2021-07-04: 130 mg via INTRAVENOUS

## 2021-07-04 MED ORDER — ORAL CARE MOUTH RINSE: 15.0000 mL | Freq: Once | OROMUCOSAL | Status: AC

## 2021-07-04 MED ORDER — GABAPENTIN 300 MG PO CAPS
300.0000 mg | ORAL_CAPSULE | Freq: Three times a day (TID) | ORAL | Status: DC
  Administered 2021-07-04 – 2021-07-05 (×3): 300 mg via ORAL
  Filled 2021-07-04 (×3): qty 1

## 2021-07-04 MED ORDER — METOPROLOL SUCCINATE ER 50 MG PO TB24
50.0000 mg | ORAL_TABLET | Freq: Every day | ORAL | Status: DC
  Administered 2021-07-05: 50 mg via ORAL
  Filled 2021-07-04: qty 1

## 2021-07-04 MED ORDER — THROMBIN 20000 UNITS EX SOLR
CUTANEOUS | Status: AC
  Filled 2021-07-04: qty 20000

## 2021-07-04 MED ORDER — ONDANSETRON HCL 4 MG/2ML IJ SOLN
INTRAMUSCULAR | Status: AC
  Filled 2021-07-04: qty 2

## 2021-07-04 MED ORDER — THROMBIN 5000 UNITS EX SOLR
CUTANEOUS | Status: AC
  Filled 2021-07-04: qty 5000

## 2021-07-04 MED ORDER — CHLORHEXIDINE GLUCONATE 0.12 % MT SOLN
15.0000 mL | Freq: Once | OROMUCOSAL | Status: AC
  Administered 2021-07-04: 15 mL via OROMUCOSAL
  Filled 2021-07-04: qty 15

## 2021-07-04 MED ORDER — PROPOFOL 10 MG/ML IV BOLUS
INTRAVENOUS | Status: AC
  Filled 2021-07-04: qty 20

## 2021-07-04 MED ORDER — INSULIN ASPART 100 UNIT/ML IJ SOLN: 0.0000 [IU] | INTRAMUSCULAR | Status: DC | PRN

## 2021-07-04 MED ORDER — HYDROMORPHONE HCL 1 MG/ML IJ SOLN: 0.5000 mg | INTRAMUSCULAR | Status: DC | PRN

## 2021-07-04 MED ORDER — DEXAMETHASONE SODIUM PHOSPHATE 10 MG/ML IJ SOLN
INTRAMUSCULAR | Status: AC
  Filled 2021-07-04: qty 1

## 2021-07-04 MED ORDER — SENNA 8.6 MG PO TABS
1.0000 | ORAL_TABLET | Freq: Two times a day (BID) | ORAL | Status: DC
  Administered 2021-07-04 – 2021-07-05 (×2): 8.6 mg via ORAL
  Filled 2021-07-04 (×2): qty 1

## 2021-07-04 MED ORDER — ROCURONIUM BROMIDE 10 MG/ML (PF) SYRINGE
PREFILLED_SYRINGE | INTRAVENOUS | Status: DC | PRN
  Administered 2021-07-04 (×3): 20 mg via INTRAVENOUS
  Administered 2021-07-04: 50 mg via INTRAVENOUS

## 2021-07-04 MED ORDER — MENTHOL 3 MG MT LOZG: 1.0000 | LOZENGE | OROMUCOSAL | Status: DC | PRN

## 2021-07-04 MED ORDER — BUPIVACAINE HCL (PF) 0.25 % IJ SOLN
INTRAMUSCULAR | Status: AC
  Filled 2021-07-04: qty 30

## 2021-07-04 MED ORDER — ONDANSETRON HCL 4 MG/2ML IJ SOLN
4.0000 mg | Freq: Four times a day (QID) | INTRAMUSCULAR | Status: DC | PRN
  Administered 2021-07-04 – 2021-07-05 (×2): 4 mg via INTRAVENOUS
  Filled 2021-07-04 (×2): qty 2

## 2021-07-04 MED ORDER — GLYCOPYRROLATE PF 0.2 MG/ML IJ SOSY
PREFILLED_SYRINGE | INTRAMUSCULAR | Status: AC
  Filled 2021-07-04: qty 1

## 2021-07-04 MED ORDER — SODIUM CHLORIDE 0.9 % IV SOLN: 250.0000 mL | INTRAVENOUS | Status: DC

## 2021-07-04 MED ORDER — AMLODIPINE BESYLATE 5 MG PO TABS
10.0000 mg | ORAL_TABLET | Freq: Every day | ORAL | Status: DC
  Administered 2021-07-04 – 2021-07-05 (×2): 10 mg via ORAL
  Filled 2021-07-04 (×2): qty 2

## 2021-07-04 MED ORDER — SUGAMMADEX SODIUM 200 MG/2ML IV SOLN
INTRAVENOUS | Status: DC | PRN
  Administered 2021-07-04: 200 mg via INTRAVENOUS

## 2021-07-04 MED ORDER — DEXAMETHASONE SODIUM PHOSPHATE 10 MG/ML IJ SOLN
INTRAMUSCULAR | Status: DC | PRN
  Administered 2021-07-04: 5 mg via INTRAVENOUS

## 2021-07-04 MED ORDER — LACTATED RINGERS IV SOLN: INTRAVENOUS | Status: DC

## 2021-07-04 MED ORDER — IRBESARTAN 75 MG PO TABS
37.5000 mg | ORAL_TABLET | Freq: Every day | ORAL | Status: DC
  Administered 2021-07-04 – 2021-07-05 (×2): 37.5 mg via ORAL
  Filled 2021-07-04 (×2): qty 0.5

## 2021-07-04 MED ORDER — CHLORHEXIDINE GLUCONATE CLOTH 2 % EX PADS
6.0000 | MEDICATED_PAD | Freq: Once | CUTANEOUS | Status: DC
Start: 2021-07-04 — End: 2021-07-04

## 2021-07-04 MED ORDER — MIRABEGRON ER 50 MG PO TB24
50.0000 mg | ORAL_TABLET | Freq: Every day | ORAL | Status: DC
  Administered 2021-07-04 – 2021-07-05 (×2): 50 mg via ORAL
  Filled 2021-07-04 (×2): qty 1

## 2021-07-04 MED ORDER — GABAPENTIN 300 MG PO CAPS
300.0000 mg | ORAL_CAPSULE | ORAL | Status: AC
  Administered 2021-07-04: 300 mg via ORAL
  Filled 2021-07-04: qty 1

## 2021-07-04 MED ORDER — FENTANYL CITRATE (PF) 250 MCG/5ML IJ SOLN
INTRAMUSCULAR | Status: DC | PRN
Start: 2021-07-04 — End: 2021-07-04
  Administered 2021-07-04 (×2): 50 ug via INTRAVENOUS
  Administered 2021-07-04: 100 ug via INTRAVENOUS

## 2021-07-04 MED ORDER — PHENOL 1.4 % MT LIQD: 1.0000 | OROMUCOSAL | Status: DC | PRN

## 2021-07-04 MED ORDER — THROMBIN 5000 UNITS EX SOLR: CUTANEOUS | Status: DC | PRN

## 2021-07-04 MED ORDER — HYDROMORPHONE HCL 1 MG/ML IJ SOLN
INTRAMUSCULAR | Status: AC
Start: 2021-07-04 — End: ?
  Filled 2021-07-04: qty 0.5

## 2021-07-04 MED ORDER — POTASSIUM CHLORIDE IN NACL 20-0.9 MEQ/L-% IV SOLN
INTRAVENOUS | Status: DC
  Filled 2021-07-04: qty 1000

## 2021-07-04 MED ORDER — ALBUMIN HUMAN 5 % IV SOLN: INTRAVENOUS | Status: DC | PRN

## 2021-07-04 MED ORDER — NITROGLYCERIN 0.4 MG SL SUBL: 0.4000 mg | SUBLINGUAL_TABLET | SUBLINGUAL | Status: DC | PRN

## 2021-07-04 MED ORDER — ONDANSETRON HCL 4 MG PO TABS: 4.0000 mg | ORAL_TABLET | Freq: Four times a day (QID) | ORAL | Status: DC | PRN

## 2021-07-04 MED ORDER — DROPERIDOL 2.5 MG/ML IJ SOLN: 0.6250 mg | Freq: Once | INTRAMUSCULAR | Status: DC | PRN

## 2021-07-04 MED ORDER — HYDROMORPHONE HCL 1 MG/ML IJ SOLN: 0.2500 mg | INTRAMUSCULAR | Status: DC | PRN

## 2021-07-04 MED ORDER — CELECOXIB 200 MG PO CAPS
200.0000 mg | ORAL_CAPSULE | Freq: Two times a day (BID) | ORAL | Status: DC
  Administered 2021-07-04 – 2021-07-05 (×2): 200 mg via ORAL
  Filled 2021-07-04 (×2): qty 1

## 2021-07-04 MED ORDER — ACETAMINOPHEN 500 MG PO TABS
1000.0000 mg | ORAL_TABLET | Freq: Four times a day (QID) | ORAL | Status: DC
  Administered 2021-07-04 – 2021-07-05 (×3): 1000 mg via ORAL
  Filled 2021-07-04 (×3): qty 2

## 2021-07-04 MED ORDER — INSULIN ASPART 100 UNIT/ML IJ SOLN
0.0000 [IU] | Freq: Three times a day (TID) | INTRAMUSCULAR | Status: DC
  Administered 2021-07-04 – 2021-07-05 (×2): 3 [IU] via SUBCUTANEOUS

## 2021-07-04 MED ORDER — ONDANSETRON HCL 4 MG/2ML IJ SOLN
INTRAMUSCULAR | Status: DC | PRN
Start: 2021-07-04 — End: 2021-07-04

## 2021-07-04 MED ORDER — FUROSEMIDE 20 MG PO TABS
20.0000 mg | ORAL_TABLET | Freq: Every day | ORAL | Status: DC
  Administered 2021-07-04 – 2021-07-05 (×2): 20 mg via ORAL
  Filled 2021-07-04 (×2): qty 1

## 2021-07-04 MED ORDER — HYDROMORPHONE HCL 1 MG/ML IJ SOLN
INTRAMUSCULAR | Status: DC | PRN
  Administered 2021-07-04: 1 mg via INTRAVENOUS

## 2021-07-04 MED ORDER — HYDROMORPHONE HCL 1 MG/ML IJ SOLN
INTRAMUSCULAR | Status: AC
  Filled 2021-07-04: qty 0.5

## 2021-07-04 MED ORDER — OXYCODONE HCL 5 MG PO TABS: 10.0000 mg | ORAL_TABLET | ORAL | Status: DC | PRN

## 2021-07-04 MED ORDER — GLYCOPYRROLATE PF 0.2 MG/ML IJ SOSY
PREFILLED_SYRINGE | INTRAMUSCULAR | Status: DC | PRN
  Administered 2021-07-04: .2 mg via INTRAVENOUS

## 2021-07-04 SURGICAL SUPPLY — 63 items
BAG COUNTER SPONGE SURGICOUNT (BAG) ×2 IMPLANT
BASKET BONE COLLECTION (BASKET) ×2 IMPLANT
BENZOIN TINCTURE PRP APPL 2/3 (GAUZE/BANDAGES/DRESSINGS) ×2 IMPLANT
BLADE CLIPPER SURG (BLADE) IMPLANT
BUR CARBIDE MATCH 3.0 (BURR) ×2 IMPLANT
CANISTER SUCT 3000ML PPV (MISCELLANEOUS) ×2 IMPLANT
CNTNR URN SCR LID CUP LEK RST (MISCELLANEOUS) ×1 IMPLANT
CONT SPEC 4OZ STRL OR WHT (MISCELLANEOUS) ×2
COVER BACK TABLE 60X90IN (DRAPES) ×2 IMPLANT
DERMABOND ADVANCED (GAUZE/BANDAGES/DRESSINGS) ×1
DERMABOND ADVANCED .7 DNX12 (GAUZE/BANDAGES/DRESSINGS) ×1 IMPLANT
DRAPE C-ARM 42X72 X-RAY (DRAPES) ×4 IMPLANT
DRAPE C-ARMOR (DRAPES) IMPLANT
DRAPE LAPAROTOMY 100X72X124 (DRAPES) ×2 IMPLANT
DRAPE SURG 17X23 STRL (DRAPES) ×2 IMPLANT
DRSG OPSITE POSTOP 4X6 (GAUZE/BANDAGES/DRESSINGS) ×1 IMPLANT
DURAPREP 26ML APPLICATOR (WOUND CARE) ×2 IMPLANT
ELECT REM PT RETURN 9FT ADLT (ELECTROSURGICAL) ×2
ELECTRODE REM PT RTRN 9FT ADLT (ELECTROSURGICAL) ×1 IMPLANT
EVACUATOR 1/8 PVC DRAIN (DRAIN) ×2 IMPLANT
GAUZE 4X4 16PLY ~~LOC~~+RFID DBL (SPONGE) IMPLANT
GLOVE SURG ENC MOIS LTX SZ7 (GLOVE) IMPLANT
GLOVE SURG ENC MOIS LTX SZ8 (GLOVE) ×4 IMPLANT
GLOVE SURG UNDER POLY LF SZ7 (GLOVE) IMPLANT
GOWN STRL REUS W/ TWL LRG LVL3 (GOWN DISPOSABLE) IMPLANT
GOWN STRL REUS W/ TWL XL LVL3 (GOWN DISPOSABLE) ×2 IMPLANT
GOWN STRL REUS W/TWL 2XL LVL3 (GOWN DISPOSABLE) IMPLANT
GOWN STRL REUS W/TWL LRG LVL3 (GOWN DISPOSABLE)
GOWN STRL REUS W/TWL XL LVL3 (GOWN DISPOSABLE) ×4
GRAFT BONE PROTEIOS XL 10CC (Orthopedic Implant) ×1 IMPLANT
HEMOSTAT POWDER KIT SURGIFOAM (HEMOSTASIS) IMPLANT
KIT BASIN OR (CUSTOM PROCEDURE TRAY) ×2 IMPLANT
KIT GRAFTMAG DEL NEURO DISP (NEUROSURGERY SUPPLIES) IMPLANT
KIT TURNOVER KIT B (KITS) ×2 IMPLANT
MATRIX STRIP NEOCORE 12C (Putty) IMPLANT
MILL MEDIUM DISP (BLADE) IMPLANT
NDL HYPO 25X1 1.5 SAFETY (NEEDLE) ×1 IMPLANT
NEEDLE HYPO 25X1 1.5 SAFETY (NEEDLE) ×2 IMPLANT
NS IRRIG 1000ML POUR BTL (IV SOLUTION) ×2 IMPLANT
PACK LAMINECTOMY NEURO (CUSTOM PROCEDURE TRAY) ×2 IMPLANT
PAD ARMBOARD 7.5X6 YLW CONV (MISCELLANEOUS) ×6 IMPLANT
ROD LORD LIPPED TI 5.5X65 (Rod) ×2 IMPLANT
SCREW ILIAC PA 5.5X40 (Screw) ×2 IMPLANT
SCREW KODIAK 5.5X45 (Screw) ×2 IMPLANT
SCREW POLYAXIAL TULIP (Screw) ×2 IMPLANT
SCREW SHANK MOD 5.5X40 (Screw) ×2 IMPLANT
SET SCREW (Screw) ×12 IMPLANT
SET SCREW SPNE (Screw) IMPLANT
SPACER TRANSCEND 10X9X25 15D (Spacer) ×2 IMPLANT
SPACER TRANSCEND 9X9X25 10D (Spacer) ×2 IMPLANT
SPONGE SURGIFOAM ABS GEL 100 (HEMOSTASIS) ×2 IMPLANT
SPONGE T-LAP 4X18 ~~LOC~~+RFID (SPONGE) IMPLANT
STRIP CLOSURE SKIN 1/2X4 (GAUZE/BANDAGES/DRESSINGS) ×3 IMPLANT
STRIP MATRIX NEOCORE 12CC (Putty) ×1 IMPLANT
SUT VIC AB 0 CT1 18XCR BRD8 (SUTURE) ×1 IMPLANT
SUT VIC AB 0 CT1 8-18 (SUTURE) ×2
SUT VIC AB 2-0 CP2 18 (SUTURE) ×2 IMPLANT
SUT VIC AB 3-0 SH 8-18 (SUTURE) ×4 IMPLANT
SYR CONTROL 10ML LL (SYRINGE) ×2 IMPLANT
TOWEL GREEN STERILE (TOWEL DISPOSABLE) ×2 IMPLANT
TOWEL GREEN STERILE FF (TOWEL DISPOSABLE) ×2 IMPLANT
TRAY FOLEY MTR SLVR 16FR STAT (SET/KITS/TRAYS/PACK) ×2 IMPLANT
WATER STERILE IRR 1000ML POUR (IV SOLUTION) ×2 IMPLANT

## 2021-07-04 NOTE — Anesthesia Procedure Notes (Signed)
Procedure Name: Intubation ?Date/Time: 07/04/2021 11:33 AM ?Performed by: Janace Litten, CRNA ?Pre-anesthesia Checklist: Patient identified, Emergency Drugs available, Suction available and Patient being monitored ?Patient Re-evaluated:Patient Re-evaluated prior to induction ?Oxygen Delivery Method: Circle System Utilized ?Preoxygenation: Pre-oxygenation with 100% oxygen ?Induction Type: IV induction ?Ventilation: Mask ventilation without difficulty and Oral airway inserted - appropriate to patient size ?Laryngoscope Size: Mac and 3 ?Grade View: Grade I ?Tube type: Oral ?Tube size: 7.0 mm ?Number of attempts: 1 ?Airway Equipment and Method: Stylet and Oral airway ?Placement Confirmation: ETT inserted through vocal cords under direct vision, positive ETCO2 and breath sounds checked- equal and bilateral ?Secured at: 21 cm ?Tube secured with: Tape ?Dental Injury: Teeth and Oropharynx as per pre-operative assessment  ?Comments: Intubated by Everlene Other SRNA ? ? ? ? ?

## 2021-07-04 NOTE — Progress Notes (Signed)
Patient removed ring in short stay.  Ring was taken to husband Bill in waiting area by RN. ?

## 2021-07-04 NOTE — H&P (Signed)
Subjective: ?Patient is a 76 y.o. female admitted for back pain with leg pain. Onset of symptoms was several months ago, gradually worsening since that time.  The pain is rated severe, and is located at the across the lower back and radiates to legs. The pain is described as aching and occurs intermittently. The symptoms have been progressive. Symptoms are exacerbated by exercise, standing, and walking for more than a few minutes. MRI or CT showed severe spinal stenosis L3-4, moderately severe spinal stenosis L4-5, with spondylolisthesis at each level ? ?Past Medical History:  ?Diagnosis Date  ? Arthritis   ? CAD (coronary artery disease) 11/1999  ? RCA stent X3, patent '06. Nuc low risk 9/13  ? Cataract   ? beginning stages  ? Diabetes mellitus without complication ([place])   ? Dysrhythmia   ? History of stress test 01/08/2012  ? Showed minimal anterior thinning not felt to be significant.  ? Hx of echocardiogram 12/12/2011  ? EF>55%  ? Hypercholesteremia   ? Hypertension   ? Myocardial infarction [facility])   ? 46 years ago    age 48  ? PAF (paroxysmal atrial fibrillation) ([place]) 11/2011  ? SSS component with some bradycardia  ? Stroke [facility]) 1998  ? Vitamin D deficiency   ?  ?Past Surgical History:  ?Procedure Laterality Date  ? ABDOMINAL HYSTERECTOMY    ? CATARACT EXTRACTION, BILATERAL    ? CHOLECYSTECTOMY    ? COLONOSCOPY    ? CORONARY ANGIOGRAM  08/2004  ? patent stents  ? CORONARY ANGIOPLASTY WITH STENT PLACEMENT  11/1999  ? X 3  ? EYE SURGERY    ? MULTIPLE TOOTH EXTRACTIONS    ? ROBOTIC ASSISTED TOTAL HYSTERECTOMY WITH BILATERAL SALPINGO OOPHERECTOMY Bilateral 03/29/2020  ? Procedure: XI ROBOTIC ASSISTED TOTAL HYSTERECTOMY WITH BILATERAL SALPINGO OOPHORECTOMY;  Surgeon: Christophe Louis, MD;  Location: [facility];  Service: Gynecology;  Laterality: Bilateral;  Tracie to RNFA confirmed on 02/16/20 CS  ?  ?Prior to Admission medications   ?Medication Sig Start Date End Date Taking? Authorizing Provider  ?acetaminophen (TYLENOL)  500 MG tablet Take 2 tablets (1,000 mg total) by mouth every 8 (eight) hours as needed. ?Patient taking differently: Take 1,000 mg by mouth every 8 (eight) hours as needed for mild pain or moderate pain. 03/30/20  Yes Christophe Louis, MD  ?amLODipine-atorvastatin (CADUET) 10-80 MG tablet Take 1 tablet by mouth in the evening 03/12/21  Yes Troy Sine, MD  ?aspirin EC 81 MG tablet Take 1 tablet (81 mg total) by mouth daily. 03/01/16  Yes Troy Sine, MD  ?ezetimibe (ZETIA) 10 MG tablet Take 1 tablet by mouth once daily 06/15/21  Yes Troy Sine, MD  ?furosemide (LASIX) 20 MG tablet Take 1 tablet by mouth once daily 06/15/21  Yes Troy Sine, MD  ?gabapentin (NEURONTIN) 300 MG capsule Take 300 mg by mouth in the morning, at noon, and at bedtime. 05/06/21  Yes [provider]  ?metFORMIN (GLUCOPHAGE) 500 MG tablet Take 500 mg by mouth 2 (two) times daily. 03/23/21  Yes [provider]  ?metoprolol succinate (TOPROL-XL) 50 MG 24 hr tablet TAKE 1 TABLET BY MOUTH ONCE DAILY IMMEDIATELY FOLLOWING A MEAL 06/15/21  Yes Troy Sine, MD  ?Multiple Vitamins-Minerals (CENTRUM SILVER ADULT 50+) TABS Take 1 tablet by mouth daily.   Yes [provider]  ?MYRBETRIQ 50 MG TB24 tablet Take 50 mg by mouth daily. 04/12/21  Yes [provider]  ?nitroGLYCERIN (NITROSTAT) 0.4 MG SL tablet Place  1 tablet (0.4 mg total) under the tongue every 5 (five) minutes as needed for chest pain. 04/05/21 07/04/21 Yes Troy Sine, MD  ?olmesartan (BENICAR) 20 MG tablet Take 1 tablet (20 mg total) by mouth daily. 03/13/21  Yes Troy Sine, MD  ? ?No Known Allergies  ?Social History  ? ?Tobacco Use  ? Smoking status: Some Days  ?  Years: 60.00  ?  Types: Cigarettes  ? Smokeless tobacco: Never  ? Tobacco comments:  ?  2-3 cigarettes per day  ?Substance Use Topics  ? Alcohol use: Not Currently  ?  ?Family History  ?Problem Relation Age of Onset  ? Diabetes type II Sister   ? Heart disease Sister   ? Colon  cancer Neg Hx   ? Colon polyps Neg Hx   ? Esophageal cancer Neg Hx   ? Rectal cancer Neg Hx   ? Stomach cancer Neg Hx   ? ?  ?Review of Systems ? ?Positive ROS: Negative ? ?All other systems have been reviewed and were otherwise negative with the exception of those mentioned in the HPI and as above. ? ?Objective: ?Vital signs in last 24 hours: ?Temp:  [98.7 ?F (37.1 ?C)] 98.7 ?F (37.1 ?C) (03/22 6979) ?Pulse Rate:  [58] 58 (03/22 0814) ?Resp:  [17] 17 (03/22 4801) ?BP: (151)/(87) 151/87 (03/22 6553) ?SpO2:  [97 %] 97 % (03/22 0814) ?Weight:  [88.9 kg] 88.9 kg (03/22 0814) ? ?General Appearance: Alert, cooperative, no distress, appears stated age ?Head: Normocephalic, without obvious abnormality, atraumatic ?Eyes: PERRL, conjunctiva/corneas clear, EOM's intact    ?Neck: Supple, symmetrical, trachea midline ?Back: Symmetric, no curvature, ROM normal, no CVA tenderness ?Lungs:  respirations unlabored ?Heart: Regular rate and rhythm ?Abdomen: Soft, non-tender ?Extremities: Extremities normal, atraumatic, no cyanosis or edema ?Pulses: 2+ and symmetric all extremities ?Skin: Skin color, texture, turgor normal, no rashes or lesions ? ?NEUROLOGIC:  ? ?Mental status: Alert and oriented x4,  no aphasia, good attention span, fund of knowledge, and memory ?Motor Exam - grossly normal ?Sensory Exam - grossly normal ?Reflexes: 1+ ?Coordination - grossly normal ?Gait - grossly normal ?Balance - grossly normal ?Cranial Nerves: ?I: smell Not tested  ?II: visual acuity  OS: nl    OD: nl  ?II: visual fields Full to confrontation  ?II: pupils Equal, round, reactive to light  ?III,VII: ptosis None  ?III,IV,VI: extraocular muscles  Full ROM  ?V: mastication Normal  ?V: facial light touch sensation  Normal  ?V,VII: corneal reflex  Present  ?VII: facial muscle function - upper  Normal  ?VII: facial muscle function - lower Normal  ?VIII: hearing Not tested  ?IX: soft palate elevation  Normal  ?IX,X: gag reflex Present  ?XI: trapezius  strength  5/5  ?XI: sternocleidomastoid strength 5/5  ?XI: neck flexion strength  5/5  ?XII: tongue strength  Normal  ? ? ?Data Review ?Lab Results  ?Component Value Date  ? WBC 8.2 07/02/2021  ? HGB 13.5 07/02/2021  ? HCT 41.2 07/02/2021  ? MCV 93.4 07/02/2021  ? PLT 291 07/02/2021  ? ?Lab Results  ?Component Value Date  ? NA 141 07/02/2021  ? K 3.2 (L) 07/02/2021  ? CL 108 07/02/2021  ? CO2 21 (L) 07/02/2021  ? BUN 10 07/02/2021  ? CREATININE 1.02 (H) 07/02/2021  ? GLUCOSE 139 (H) 07/02/2021  ? ?Lab Results  ?Component Value Date  ? INR 1.1 07/02/2021  ? ? ?Assessment/Plan: ? ?Estimated body mass index is 32.62 kg/m? as calculated from  the following: ?  Height as of this encounter: '5\' 5"'$  (1.651 m). ?  Weight as of this encounter: 88.9 kg. ?Patient admitted for Plif L3-4 L4-5. Patient has failed a reasonable attempt at conservative therapy. ? ?I explained the condition and procedure to the patient and answered any questions.  Patient wishes to proceed with procedure as planned. Understands risks/ benefits and typical outcomes of procedure. ? ? ?Eustace Moore ?07/04/2021 10:47 AM ? ?

## 2021-07-04 NOTE — Transfer of Care (Signed)
Immediate Anesthesia Transfer of Care Note ? ?Patient: Debbie [name] ? ?Procedure(s) Performed: Posterior Lumbar Interbody Fusion Lumbar three-four, Lumbar four-five (Back) ? ?Patient Location: PACU ? ?Anesthesia Type:General ? ?Level of Consciousness: drowsy, patient cooperative and responds to stimulation ? ?Airway & Oxygen Therapy: Patient Spontanous Breathing ? ?Post-op Assessment: Report given to RN and Post -op Vital signs reviewed and stable ? ?Post vital signs: Reviewed and stable ? ?Last Vitals:  ?Vitals Value Taken Time  ?BP 141/59 07/04/21 1525  ?Temp    ?Pulse 60 07/04/21 1526  ?Resp 9 07/04/21 1526  ?SpO2 96 % 07/04/21 1526  ?Vitals shown include unvalidated device data. ? ?Last Pain:  ?Vitals:  ? 07/04/21 0840  ?TempSrc:   ?PainSc: 0-No pain  ?   ? ?Patients Stated Pain Goal: 3 (07/04/21 0840) ? ?Complications: No notable events documented. ?

## 2021-07-04 NOTE — Op Note (Signed)
07/04/2021 ? ?3:24 PM ? ?PATIENT:  Nekeya Briski  76 y.o. female ? ?PRE-OPERATIVE DIAGNOSIS: Spondylolisthesis L3-4 L4-5, spinal stenosis L3-4 L4-5, back pain with leg pain ? ?POST-OPERATIVE DIAGNOSIS:  same ? ?PROCEDURE:   ?1. Decompressive lumbar laminectomy, hemi facetectomy and foraminotomies L3-4 L4-5 requiring more work than would be required for a simple exposure of the disk for PLIF in order to adequately decompress the neural elements and address the spinal stenosis ?2. Posterior lumbar interbody fusion L3-4 L4-5 using peek interbody cages packed with morcellized allograft and autograft  ?3. Posterior fixation L3-L5 inclusive using Alphatec cortical pedicle screws.  ?4. Intertransverse arthrodesis L3-L5 inclusive using morcellized autograft and allograft. ? ?SURGEON:  Sherley Bounds, MD ? ?ASSISTANTS: Glenford Peers FNP ? ?ANESTHESIA:  General ? ?EBL: 500 ml ? ?Total I/O ?In: 2050 [I.V.:1800; IV Piggyback:250] ?Out: 725 [Urine:175; Blood:550] ? ?BLOOD ADMINISTERED:none ? ?DRAINS: none  ? ?INDICATION FOR PROCEDURE: This patient presented with back pain with leg pain. Imaging revealed spondylolisthesis L3-4 L4-5 with spinal stenosis. The patient tried a reasonable attempt at conservative medical measures without relief. I recommended decompression and instrumented fusion to address the stenosis as well as the segmental  instability.  Patient understood the risks, benefits, and alternatives and potential outcomes and wished to proceed. ? ?PROCEDURE DETAILS:  ?The patient was brought to the operating room. After induction of generalized endotracheal anesthesia the patient was rolled into the prone position on chest rolls and all pressure points were padded. The patient's lumbar region was cleaned and then prepped with DuraPrep and draped in the usual sterile fashion. Anesthesia was injected and then a dorsal midline incision was made and carried down to the lumbosacral fascia. The fascia was opened and the  paraspinous musculature was taken down in a subperiosteal fashion to expose L3-4 and L4-5. A self-retaining retractor was placed. Intraoperative fluoroscopy confirmed my level, and I started with placement of the L3 cortical pedicle screws. The pedicle screw entry zones were identified utilizing surface landmarks and  AP and lateral fluoroscopy. I scored the cortex with the high-speed drill and then used the hand drill to drill an upward and outward direction into the pedicle. I then tapped line to line. I then placed a 5.5 x 40 mm cortical pedicle screw into the pedicles of L3 bilaterally.  ? ? I then turned my attention to the decompression and complete lumbar laminectomies, hemi- facetectomies, and foraminotomies were performed at L3-4 and L4-5.  My nurse practitioner was directly involved in the decompression and exposure of the neural elements. the patient had significant spinal stenosis and this required more work than would be required for a simple exposure of the disc for posterior lumbar interbody fusion which would only require a limited laminotomy. Much more generous decompression and generous foraminotomy was undertaken in order to adequately decompress the neural elements and address the patient's leg pain. The yellow ligament was removed to expose the underlying dura and nerve roots, and generous foraminotomies were performed to adequately decompress the neural elements. Both the exiting and traversing nerve roots were decompressed on both sides until a coronary dilator passed easily along the nerve roots. Once the decompression was complete, I turned my attention to the posterior lower lumbar interbody fusion. The epidural venous vasculature was coagulated and cut sharply. Disc space was incised and the initial discectomy was performed with pituitary rongeurs. The disc space was distracted with sequential distractors to a height of 9 mm at L3-4 and 10 mm at L4-5. We then  used a series of scrapers and  shavers to prepare the endplates for fusion. The midline was prepared with Epstein curettes. Once the complete discectomy was finished, we packed an appropriate sized interbody cage with local autograft and morcellized allograft, gently retracted the nerve root, and tapped the cage into position at L3-4 and L4-5.  The midline between the cages was packed with morselized autograft and allograft.  ? ?We then turned our attention to the placement of the lower pedicle screws. The pedicle screw entry zones were identified utilizing surface landmarks and fluoroscopy. I drilled into each pedicle utilizing the hand drill, and tapped each pedicle with the appropriate tap. We palpated with a ball probe to assure no break in the cortex. We then placed 5.5 x 40 mm pedicle screws into the pedicles bilaterally at L4 and L5 bilateral.  My nurse practitioner assisted in placement of the pedicle screws.  We then decorticated the transverse processes and laid a mixture of morcellized autograft and allograft out over these to perform intertransverse arthrodesis at L3-L5. We then placed lordotic rods into the multiaxial screw heads of the pedicle screws and locked these in position with the locking caps and anti-torque device. We then checked our construct with AP and lateral fluoroscopy. Irrigated with copious amounts of bacitracin-containing saline solution. Inspected the nerve roots once again to assure adequate decompression, lined to the dura with Gelfoam,  and then we closed the muscle and the fascia with 0 Vicryl. Closed the subcutaneous tissues with 2-0 Vicryl and subcuticular tissues with 3-0 Vicryl. The skin was closed with benzoin and Steri-Strips. Dressing was then applied, the patient was awakened from general anesthesia and transported to the recovery room in stable condition. At the end of the procedure all sponge, needle and instrument counts were correct.  ? ?PLAN OF CARE: admit to inpatient ? ?PATIENT DISPOSITION:   PACU - hemodynamically stable. ?  ?Delay start of Pharmacological VTE agent (>24hrs) due to surgical blood loss or risk of bleeding:  yes ? ? ?

## 2021-07-05 LAB — GLUCOSE, CAPILLARY: Glucose-Capillary: 160 mg/dL — ABNORMAL HIGH (ref 70–99)

## 2021-07-05 MED ORDER — METHOCARBAMOL 500 MG PO TABS: 500.0000 mg | ORAL_TABLET | Freq: Four times a day (QID) | ORAL | 0 refills | Status: DC

## 2021-07-05 MED ORDER — HYDROCODONE-ACETAMINOPHEN 5-325 MG PO TABS: 1.0000 | ORAL_TABLET | ORAL | 0 refills | Status: DC | PRN

## 2021-07-05 NOTE — Anesthesia Postprocedure Evaluation (Signed)
Anesthesia Post Note ? ?Patient: Debbie [name] ? ?Procedure(s) Performed: Posterior Lumbar Interbody Fusion Lumbar three-four, Lumbar four-five (Back) ? ?  ? ?Anesthesia Post Evaluation ?No notable events documented. ? ?Last Vitals:  ?Vitals:  ? 07/05/21 0349 07/05/21 0748  ?BP: (!) 156/83 140/72  ?Pulse: 99 77  ?Resp: 20 16  ?Temp: 37.1 ?C 36.8 ?C  ?SpO2: 95% 96%  ?  ?Last Pain:  ?Vitals:  ? 07/05/21 0748  ?TempSrc: Oral  ?PainSc: 0-No pain  ? ? ?  ?  ?  ?  ?  ?  ? ?Nolon Nations ? ? ? ? ?

## 2021-07-05 NOTE — Plan of Care (Signed)

## 2021-07-05 NOTE — Discharge Instructions (Signed)
Wound Care ?Keep the incision clean and dry remove the outer dressing in 3 days, leave the Steri-Strips intact.  ?Do not put any creams, lotions, or ointments on incision. ?Leave steri-strips on back.  They will fall off by themselves. ? ?Activity ?Walk each and every day, increasing distance each day. ?No lifting greater than 5 lbs.  ?No lifting no bending no twisting no driving or riding a car unless coming back and forth to see me. ?If provided with back brace, wear when out of bed.  It is not necessary to wear brace in bed. ? ?Diet ?Resume your normal diet.  ? ? ?Call Your Doctor If Any of These Occur ?Redness, drainage, or swelling at the wound.  ?Temperature greater than 101 degrees. ?Severe pain not relieved by pain medication. ?Incision starts to come apart. ? ?Follow Up Appt ?Call today for appointment in 1-2 weeks ([phone]) or for problems.  If you have any hardware placed in your spine, you will need an x-ray before your appointment. ?  ?

## 2021-07-05 NOTE — Progress Notes (Signed)
Patient awaiting transport via wheelchair by volunteer for discharge home; in no acute distress nor complaints of pain nor discomfort; incision on her back with honeycomb dressing and is clean, dry and intact; room was checked for all her belongings and husband took it along with him; discharge instructions concerning his medications, incision care, follow up appointment and when to call the doctor as needed were all discussed with patient and husband by RN and both expressed understanding on the instructions given. ?

## 2021-07-05 NOTE — Evaluation (Signed)
Occupational Therapy Evaluation ?Patient Details ?Name: Debbie [name] ?MRN: [id number] ?DOB: 1946-01-29 ?Today's Date: 07/05/2021 ? ? ?History of Present Illness 76 yo F s/p PLIF.  PMH includes: Arthritis, CAD, Diabetes mellitus, Hypertension, Stroke.  ? ?Clinical Impression ?  ?Patient admitted for the procedure above.  PTA, per family, she was fairly sedentary.  Typically walked reaching for objects in her environment, and did need occasional assist with ADL/IADL.  Patient is experiencing minimal discomfort, and is actually doing pretty good.  Patient needing minimal cues for back precautions, but able to complete ADL from sit/stand level with Min A and use of reacher, and bed mobility was supervision.  PT eval is pending, but no OT needs anticipated post acute.  Brace application and back precautions reviewed.  All questions answered.    ?   ? ?Recommendations for follow up therapy are one component of a multi-disciplinary discharge planning process, led by the attending physician.  Recommendations may be updated based on patient status, additional functional criteria and insurance authorization.  ? ?Follow Up Recommendations ? No OT follow up  ?  ?Assistance Recommended at Discharge Intermittent Supervision/Assistance  ?Patient can return home with the following   ? ?  ?Functional Status Assessment ? Patient has had a recent decline in their functional status and demonstrates the ability to make significant improvements in function in a reasonable and predictable amount of time.  ?Equipment Recommendations ? None recommended by OT  ?  ?Recommendations for Other Services   ? ? ?  ?Precautions / Restrictions Precautions ?Precautions: Back ?Precaution Booklet Issued: Yes (comment) ?Required Braces or Orthoses: Spinal Brace ?Spinal Brace: Lumbar corset ?Restrictions ?Weight Bearing Restrictions: No  ? ?  ? ?Mobility Bed Mobility ?Overal bed mobility: Needs Assistance ?Bed Mobility: Sidelying to Sit, Sit to Sidelying ?   ?Sidelying to sit: Supervision ?  ?  ?Sit to sidelying: Supervision ?  ?  ? ?Transfers ?Overall transfer level: Needs assistance ?  ?Transfers: Sit to/from Stand, Bed to chair/wheelchair/BSC ?Sit to Stand: Supervision ?  ?  ?Step pivot transfers: Modified independent (Device/Increase time) ?  ?  ?  ?  ? ?  ?Balance Overall balance assessment: Needs assistance ?Sitting-balance support: Feet supported ?Sitting balance-Leahy Scale: Good ?  ?  ?Standing balance support: Reliant on assistive device for balance ?Standing balance-Leahy Scale: Fair ?  ?  ?  ?  ?  ?  ?  ?  ?  ?  ?  ?  ?   ? ?ADL either performed or assessed with clinical judgement  ? ?ADL Overall ADL's : Modified independent;At baseline ?  ?  ?  ?  ?  ?  ?  ?  ?  ?  ?  ?  ?  ?  ?  ?  ?  ?  ?  ?   ? ? ? ?Vision Baseline Vision/History: 1 Wears glasses ?Patient Visual Report: No change from baseline ?   ?   ?Perception Perception ?Perception: Not tested ?  ?Praxis Praxis ?Praxis: Not tested ?  ? ?Pertinent Vitals/Pain Pain Assessment ?Pain Assessment: Faces ?Faces Pain Scale: Hurts a little bit ?Pain Location: Incision ?Pain Descriptors / Indicators: Aching ?Pain Intervention(s): Monitored during session  ? ? ? ?Hand Dominance Right ?  ?Extremity/Trunk Assessment Upper Extremity Assessment ?Upper Extremity Assessment: Overall WFL for tasks assessed ?  ?Lower Extremity Assessment ?Lower Extremity Assessment: Defer to PT evaluation ?  ?Cervical / Trunk Assessment ?Cervical / Trunk Assessment: Back Surgery ?  ?Communication Communication ?  Communication: No difficulties ?  ?Cognition Arousal/Alertness: Awake/alert ?Behavior During Therapy: [facility] for tasks assessed/performed ?Overall Cognitive Status: Within Functional Limits for tasks assessed ?  ?  ?  ?  ?  ?  ?  ?  ?  ?  ?  ?  ?  ?  ?  ?  ?  ?  ?  ?   ? ?  ?   ?  ?    ? ? ?Home Living Family/patient expects to be discharged to:: Private residence ?Living Arrangements: Spouse/significant other ?Available Help at  Discharge: Family;Available 24 hours/day ?Type of Home: House ?Home Access: Stairs to enter ?Entrance Stairs-Number of Steps: 2 ?Entrance Stairs-Rails: None ?Home Layout: One level ?  ?  ?Bathroom Shower/Tub: Tub/shower unit ?  ?Bathroom Toilet: Standard ?Bathroom Accessibility: Yes ?How Accessible: Accessible via walker ?  ?  ?  ?  ? ?  ?Prior Functioning/Environment Prior Level of Function : Independent/Modified Independent ?  ?  ?  ?  ?  ?  ?  ?  ?  ? ?  ?  ?OT Problem List: Pain ?  ?   ?OT Treatment/Interventions:    ?  ?OT Goals(Current goals can be found in the care plan section) Acute Rehab OT Goals ?Patient Stated Goal: Return home ?OT Goal Formulation: With patient ?Time For Goal Achievement: 07/09/21 ?Potential to Achieve Goals: Good  ?OT Frequency:   ?  ? ?Co-evaluation   ?  ?  ?  ?  ? ?  ?AM-PAC OT "6 Clicks" Daily Activity     ?Outcome Measure Help from another person eating meals?: None ?Help from another person taking care of personal grooming?: None ?Help from another person toileting, which includes using toliet, bedpan, or urinal?: A Little ?Help from another person bathing (including washing, rinsing, drying)?: A Little ?Help from another person to put on and taking off regular upper body clothing?: None ?Help from another person to put on and taking off regular lower body clothing?: A Little ?6 Click Score: 21 ?  ?End of Session Equipment Utilized During Treatment: Back brace ?Nurse Communication: Mobility status ? ?Activity Tolerance: Patient tolerated treatment well ?Patient left: in bed;with call bell/phone within reach ? ?OT Visit Diagnosis: Pain  ?              ?Time: 9357-0177 ?OT Time Calculation (min): 26 min ?Charges:  OT General Charges ?$OT Visit: 1 Visit ?OT Evaluation ?$OT Eval Moderate Complexity: 1 Mod ?OT Treatments ?$Self Care/Home Management : 8-22 mins ? ?07/05/2021 ? ?RP, OTR/L ? ?Acute Rehabilitation Services ? ?Office:  [phone] ? ? ?Merlin Golden D Miquel Stacks ?07/05/2021, 8:44  AM ?

## 2021-07-05 NOTE — Evaluation (Signed)
Physical Therapy Evaluation ?Patient Details ?Name: Debbie [name] ?MRN: [id number] ?DOB: [birth year] ?Today's Date: 07/05/2021 ? ?History of Present Illness ? Pt is a 76 y/o female who presents s/p L3-L5 PLIF on 07/04/2021. PMH significant for CVA, PAF, MI, HTN, DM, CAD.  ?Clinical Impression ? Pt admitted with above diagnosis. At the time of PT eval, pt was able to demonstrate transfers and ambulation with gross supervision for safety and RW for support. Pt was educated on precautions, brace application/wearing schedule, appropriate activity progression, and car transfer. Pt currently with functional limitations due to the deficits listed below (see PT Problem List). Pt will benefit from skilled PT to increase their independence and safety with mobility to allow discharge to the venue listed below.     ?   ? ?Recommendations for follow up therapy are one component of a multi-disciplinary discharge planning process, led by the attending physician.  Recommendations may be updated based on patient status, additional functional criteria and insurance authorization. ? ?Follow Up Recommendations No PT follow up ? ?  ?Assistance Recommended at Discharge PRN  ?Patient can return home with the following ? A little help with walking and/or transfers;Assistance with cooking/housework;Assist for transportation;Help with stairs or ramp for entrance ? ?  ?Equipment Recommendations Rolling walker (2 wheels)  ?Recommendations for Other Services ?    ?  ?Functional Status Assessment Patient has had a recent decline in their functional status and demonstrates the ability to make significant improvements in function in a reasonable and predictable amount of time.  ? ?  ?Precautions / Restrictions Precautions ?Precautions: Back ?Precaution Booklet Issued: Yes (comment) ?Precaution Comments: Reviewed handout and pt was cued for precautions during functional mobility. ?Required Braces or Orthoses: Spinal Brace ?Spinal Brace: Lumbar  corset ?Restrictions ?Weight Bearing Restrictions: No  ? ?  ? ?Mobility ? Bed Mobility ?  ?  ?  ?  ?  ?  ?  ?General bed mobility comments: Pt was received sitting up EOB. Discussed log roll technique verbally. ?  ? ?Transfers ?Overall transfer level: Needs assistance ?Equipment used: Rolling walker (2 wheels) ?Transfers: Sit to/from Stand ?Sit to Stand: Supervision ?  ?  ?  ?  ?  ?General transfer comment: VC's for hand placement on seated surface for safety. No assist required but supervision provided for safety. ?  ? ?Ambulation/Gait ?Ambulation/Gait assistance: Supervision ?Gait Distance (Feet): 300 Feet ?Assistive device: Rolling walker (2 wheels) ?Gait Pattern/deviations: Step-through pattern, Decreased stride length, Trunk flexed ?Gait velocity: Decreased ?Gait velocity interpretation: <1.31 ft/sec, indicative of household ambulator ?  ?General Gait Details: VC's for improved posture, closer walker proximity, and forward gaze. No assist required however pt required frequent reminders throughout gait training. ? ?Stairs ?Stairs: Yes ?Stairs assistance: Supervision ?Stair Management: One rail Right, Forwards ?Number of Stairs: 1 ?General stair comments: Pt was able to manage 1 step well. No assist required. ? ?Wheelchair Mobility ?  ? ?Modified Rankin (Stroke Patients Only) ?  ? ?  ? ?Balance   ?  ?  ?  ?  ?  ?  ?  ?  ?  ?  ?  ?  ?  ?  ?  ?  ?  ?  ?   ? ? ? ?Pertinent Vitals/Pain Pain Assessment ?Pain Assessment: 0-10 ?Pain Score: 0-No pain ?Pain Intervention(s): Monitored during session  ? ? ?Home Living Family/patient expects to be discharged to:: Private residence ?Living Arrangements: Spouse/significant other ?Available Help at Discharge: Family;Available 24 hours/day ?Type of Home: House ?Home  Access: Stairs to enter ?Entrance Stairs-Rails: None ?Entrance Stairs-Number of Steps: 1-2 ?  ?Home Layout: One level ?Home Equipment: Rollator (4 wheels) ?   ?  ?Prior Function Prior Level of Function :  Independent/Modified Independent ?  ?  ?  ?  ?  ?  ?  ?  ?  ? ? ?Hand Dominance  ? Dominant Hand: Right ? ?  ?Extremity/Trunk Assessment  ? Upper Extremity Assessment ?Upper Extremity Assessment: Overall WFL for tasks assessed ?  ? ?Lower Extremity Assessment ?Lower Extremity Assessment: Generalized weakness (Mild; consistent with pre-op diagnosis) ?  ? ?Cervical / Trunk Assessment ?Cervical / Trunk Assessment: Back Surgery  ?Communication  ? Communication: No difficulties  ?Cognition Arousal/Alertness: Awake/alert ?Behavior During Therapy: [facility] for tasks assessed/performed ?Overall Cognitive Status: Within Functional Limits for tasks assessed ?  ?  ?  ?  ?  ?  ?  ?  ?  ?  ?  ?  ?  ?  ?  ?  ?  ?  ?  ? ?  ?General Comments   ? ?  ?Exercises    ? ?Assessment/Plan  ?  ?PT Assessment Patient needs continued PT services  ?PT Problem List Decreased strength;Decreased activity tolerance;Decreased balance;Decreased mobility;Decreased knowledge of use of DME;Decreased safety awareness;Cardiopulmonary status limiting activity;Decreased knowledge of precautions;Impaired sensation;Pain ? ?   ?  ?PT Treatment Interventions DME instruction;Stair training;Gait training;Functional mobility training;Therapeutic activities;Therapeutic exercise;Balance training;Patient/family education   ? ?PT Goals (Current goals can be found in the Care Plan section)  ?Acute Rehab PT Goals ?Patient Stated Goal: Home today ?PT Goal Formulation: With patient ?Time For Goal Achievement: 07/12/21 ?Potential to Achieve Goals: Good ? ?  ?Frequency Min 5X/week ?  ? ? ?Co-evaluation   ?  ?  ?  ?  ? ? ?  ?AM-PAC PT "6 Clicks" Mobility  ?Outcome Measure Help needed turning from your back to your side while in a flat bed without using bedrails?: None ?Help needed moving from lying on your back to sitting on the side of a flat bed without using bedrails?: A Little ?Help needed moving to and from a bed to a chair (including a wheelchair)?: A Little ?Help needed  standing up from a chair using your arms (e.g., wheelchair or bedside chair)?: A Little ?Help needed to walk in hospital room?: A Little ?Help needed climbing 3-5 steps with a railing? : A Little ?6 Click Score: 19 ? ?  ?End of Session Equipment Utilized During Treatment: Back brace ?Activity Tolerance: Patient tolerated treatment well ?Patient left: in bed;with call bell/phone within reach;with family/visitor present (Sitting EOB) ?Nurse Communication: Mobility status ?PT Visit Diagnosis: Unsteadiness on feet (R26.81);Pain ?Pain - part of body:  (back) ?  ? ?Time: 3662-9476 ?PT Time Calculation (min) (ACUTE ONLY): 17 min ? ? ?Charges:   PT Evaluation ?$PT Eval Low Complexity: 1 Low ?  ?  ?   ? ? ?Rolinda Roan, PT, DPT ?Acute Rehabilitation Services ?Pager: [phone] ?Office: [phone]  ? ?Thelma Comp ?07/05/2021, 9:21 AM ? ?

## 2021-07-05 NOTE — Discharge Summary (Signed)
Physician Discharge Summary  ?Patient ID: ?Debbie [name] ?MRN: [id number] ?DOB/AGE: April 23, 1945 76 y.o. ? ?Admit date: 07/04/2021 ?Discharge date: 07/05/2021 ? ?Admission Diagnoses: Spondylolisthesis L3-4 L4-5, spinal stenosis L3-4 L4-5, back pain with leg pain ? ? ?Discharge Diagnoses: same ? ? ?Discharged Condition: good ? ?Hospital Course: The patient was admitted on 07/04/2021 and taken to the operating room where the patient underwent plif L3-4, L4-5. The patient tolerated the procedure well and was taken to the recovery room and then to the floor in stable condition. The hospital course was routine. There were no complications. The wound remained clean dry and intact. Pt had appropriate back soreness. No complaints of leg pain or new N/T/W. The patient remained afebrile with stable vital signs, and tolerated a regular diet. The patient continued to increase activities, and pain was well controlled with oral pain medications.  ? ?Consults: None ? ?Significant Diagnostic Studies:  ?Results for orders placed or performed during the hospital encounter of 07/04/21  ?Glucose, capillary  ?Result Value Ref Range  ? Glucose-Capillary 147 (H) 70 - 99 mg/dL  ?Glucose, capillary  ?Result Value Ref Range  ? Glucose-Capillary 122 (H) 70 - 99 mg/dL  ?Glucose, capillary  ?Result Value Ref Range  ? Glucose-Capillary 119 (H) 70 - 99 mg/dL  ?Glucose, capillary  ?Result Value Ref Range  ? Glucose-Capillary 160 (H) 70 - 99 mg/dL  ?Glucose, capillary  ?Result Value Ref Range  ? Glucose-Capillary 193 (H) 70 - 99 mg/dL  ?Glucose, capillary  ?Result Value Ref Range  ? Glucose-Capillary 174 (H) 70 - 99 mg/dL  ? Comment 1 Notify RN   ? Comment 2 Document in Chart   ?Glucose, capillary  ?Result Value Ref Range  ? Glucose-Capillary 160 (H) 70 - 99 mg/dL  ? Comment 1 Notify RN   ? Comment 2 Document in Chart   ? ? ?DG Lumbar Spine 2-3 Views ? ?Result Date: 07/04/2021 ?CLINICAL DATA:  Lumbar fusion L3-4 L4-5 EXAM: LUMBAR SPINE - 2-3 VIEW  COMPARISON:  Lumbar radiographs 06/06/2020 and lumbar MRI 03/26/2021 FINDINGS: AP and lateral C-arm images were obtained of the lumbar spine. 2 level pedicle screw and interbody fusion. Fusion levels appear to be L3-4 L4-5. Anterolisthesis L4-5 noted. IMPRESSION: Pedicle screw and interbody fusion L3-4 and L4-5. Electronically Signed   By: Franchot Gallo M.D.   On: 07/04/2021 15:54  ? ?DG C-Arm 1-60 Min-No Report ? ?Result Date: 07/04/2021 ?Fluoroscopy was utilized by the requesting physician.  No radiographic interpretation.  ? ?DG C-Arm 1-60 Min-No Report ? ?Result Date: 07/04/2021 ?Fluoroscopy was utilized by the requesting physician.  No radiographic interpretation.  ? ?DG C-Arm 1-60 Min-No Report ? ?Result Date: 07/04/2021 ?Fluoroscopy was utilized by the requesting physician.  No radiographic interpretation.  ? ?DG C-Arm 1-60 Min-No Report ? ?Result Date: 07/04/2021 ?Fluoroscopy was utilized by the requesting physician.  No radiographic interpretation.   ? ?Antibiotics:  ?Anti-infectives (From admission, onward)  ? ? Start     Dose/Rate Route Frequency Ordered Stop  ? 07/04/21 2000  ceFAZolin (ANCEF) IVPB 2g/100 mL premix       ? 2 g ?200 mL/hr over 30 Minutes Intravenous Every 8 hours 07/04/21 1657 07/05/21 0359  ? 07/04/21 0815  ceFAZolin (ANCEF) IVPB 2g/100 mL premix       ? 2 g ?200 mL/hr over 30 Minutes Intravenous On call to O.R. 07/04/21 0804 07/04/21 1139  ? ?  ? ? ?Discharge Exam: ?Blood pressure (!) 156/83, pulse 99, temperature 98.8 ?F (37.1 ?C),  temperature source Oral, resp. rate 20, height '5\' 5"'$  (1.651 m), weight 88.9 kg, SpO2 95 %. ?Neurologic: Grossly normal ?Ambulating and voiding well incision cdi  ? ?Discharge Medications:   ?Allergies as of 07/05/2021   ?No Known Allergies ?  ? ?  ?Medication List  ?  ? ?TAKE these medications   ? ?acetaminophen 500 MG tablet ?Commonly known as: TYLENOL ?Take 2 tablets (1,000 mg total) by mouth every 8 (eight) hours as needed. ?What changed: reasons to take  this ?  ?amLODipine-atorvastatin 10-80 MG tablet ?Commonly known as: CADUET ?Take 1 tablet by mouth in the evening ?  ?aspirin EC 81 MG tablet ?Take 1 tablet (81 mg total) by mouth daily. ?  ?Centrum Silver Adult 50+ Tabs ?Take 1 tablet by mouth daily. ?  ?ezetimibe 10 MG tablet ?Commonly known as: ZETIA ?Take 1 tablet by mouth once daily ?  ?furosemide 20 MG tablet ?Commonly known as: LASIX ?Take 1 tablet by mouth once daily ?  ?gabapentin 300 MG capsule ?Commonly known as: NEURONTIN ?Take 300 mg by mouth in the morning, at noon, and at bedtime. ?  ?HYDROcodone-acetaminophen 5-325 MG tablet ?Commonly known as: NORCO/VICODIN ?Take 1 tablet by mouth every 4 (four) hours as needed for moderate pain. ?  ?metFORMIN 500 MG tablet ?Commonly known as: GLUCOPHAGE ?Take 500 mg by mouth 2 (two) times daily. ?  ?methocarbamol 500 MG tablet ?Commonly known as: Robaxin ?Take 1 tablet (500 mg total) by mouth 4 (four) times daily. ?  ?metoprolol succinate 50 MG 24 hr tablet ?Commonly known as: TOPROL-XL ?TAKE 1 TABLET BY MOUTH ONCE DAILY IMMEDIATELY FOLLOWING A MEAL ?  ?Myrbetriq 50 MG Tb24 tablet ?Generic drug: mirabegron ER ?Take 50 mg by mouth daily. ?  ?nitroGLYCERIN 0.4 MG SL tablet ?Commonly known as: NITROSTAT ?Place 1 tablet (0.4 mg total) under the tongue every 5 (five) minutes as needed for chest pain. ?  ?olmesartan 20 MG tablet ?Commonly known as: BENICAR ?Take 1 tablet (20 mg total) by mouth daily. ?  ? ?  ? ?  ?  ? ? ?  ?Durable Medical Equipment  ?(From admission, onward)  ?  ? ? ?  ? ?  Start     Ordered  ? 07/04/21 1658  DME Walker rolling  Once       ?Question:  Patient needs a walker to treat with the following condition  Answer:  S/P lumbar fusion  ? 07/04/21 1657  ? 07/04/21 1658  DME 3 n 1  Once       ? 07/04/21 1657  ? ?  ?  ? ?  ? ? ?Disposition: home  ? ?Final Dx: plif L3-4, L4-5 ? ?Discharge Instructions   ? ?  Remove dressing in 72 hours   Complete by: As directed ?  ? Call MD for:  difficulty breathing,  headache or visual disturbances   Complete by: As directed ?  ? Call MD for:  hives   Complete by: As directed ?  ? Call MD for:  persistant nausea and vomiting   Complete by: As directed ?  ? Call MD for:  redness, tenderness, or signs of infection (pain, swelling, redness, odor or green/yellow discharge around incision site)   Complete by: As directed ?  ? Call MD for:  severe uncontrolled pain   Complete by: As directed ?  ? Call MD for:  temperature >100.4   Complete by: As directed ?  ? Diet - low sodium heart healthy  Complete by: As directed ?  ? Driving Restrictions   Complete by: As directed ?  ? No driving for 2 weeks, no riding in the car for 1 week  ? Increase activity slowly   Complete by: As directed ?  ? Lifting restrictions   Complete by: As directed ?  ? No lifting more than 8 lbs  ? ?  ? ? ? ? ? ?Signed: ?Ocie Cornfield Prim Morace ?07/05/2021, 7:37 AM ?  ?

## 2021-07-09 ENCOUNTER — Emergency Department (HOSPITAL_COMMUNITY): Payer: Medicare PPO

## 2021-07-09 ENCOUNTER — Other Ambulatory Visit: Payer: Self-pay

## 2021-07-09 ENCOUNTER — Encounter (HOSPITAL_COMMUNITY): Payer: Self-pay | Admitting: Internal Medicine

## 2021-07-09 ENCOUNTER — Inpatient Hospital Stay (HOSPITAL_BASED_OUTPATIENT_CLINIC_OR_DEPARTMENT_OTHER): Payer: Medicare PPO

## 2021-07-09 ENCOUNTER — Observation Stay (HOSPITAL_COMMUNITY)
Admission: EM | Admit: 2021-07-09 | Discharge: 2021-07-10 | Disposition: A | Discharge status: Home Health | Payer: Medicare PPO | Attending: Internal Medicine | Admitting: Internal Medicine

## 2021-07-09 DIAGNOSIS — Z981 Arthrodesis status: Secondary | ICD-10-CM

## 2021-07-09 DIAGNOSIS — I5030 Unspecified diastolic (congestive) heart failure: Secondary | ICD-10-CM

## 2021-07-09 DIAGNOSIS — R079 Chest pain, unspecified: Secondary | ICD-10-CM

## 2021-07-09 DIAGNOSIS — I25119 Atherosclerotic heart disease of native coronary artery with unspecified angina pectoris: Secondary | ICD-10-CM

## 2021-07-09 DIAGNOSIS — I509 Heart failure, unspecified: Secondary | ICD-10-CM

## 2021-07-09 DIAGNOSIS — E278 Other specified disorders of adrenal gland: Secondary | ICD-10-CM | POA: Diagnosis not present

## 2021-07-09 DIAGNOSIS — Z955 Presence of coronary angioplasty implant and graft: Secondary | ICD-10-CM | POA: Diagnosis not present

## 2021-07-09 DIAGNOSIS — Z7982 Long term (current) use of aspirin: Secondary | ICD-10-CM | POA: Insufficient documentation

## 2021-07-09 DIAGNOSIS — I38 Endocarditis, valve unspecified: Secondary | ICD-10-CM

## 2021-07-09 DIAGNOSIS — I700 Atherosclerosis of aorta: Secondary | ICD-10-CM | POA: Diagnosis not present

## 2021-07-09 DIAGNOSIS — I10 Essential (primary) hypertension: Secondary | ICD-10-CM | POA: Diagnosis not present

## 2021-07-09 DIAGNOSIS — Z8673 Personal history of transient ischemic attack (TIA), and cerebral infarction without residual deficits: Secondary | ICD-10-CM | POA: Insufficient documentation

## 2021-07-09 DIAGNOSIS — R2681 Unsteadiness on feet: Secondary | ICD-10-CM | POA: Insufficient documentation

## 2021-07-09 DIAGNOSIS — Z7984 Long term (current) use of oral hypoglycemic drugs: Secondary | ICD-10-CM | POA: Diagnosis not present

## 2021-07-09 DIAGNOSIS — K5790 Diverticulosis of intestine, part unspecified, without perforation or abscess without bleeding: Secondary | ICD-10-CM | POA: Diagnosis not present

## 2021-07-09 DIAGNOSIS — E876 Hypokalemia: Secondary | ICD-10-CM | POA: Insufficient documentation

## 2021-07-09 DIAGNOSIS — I200 Unstable angina: Secondary | ICD-10-CM | POA: Diagnosis present

## 2021-07-09 DIAGNOSIS — I5023 Acute on chronic systolic (congestive) heart failure: Secondary | ICD-10-CM | POA: Insufficient documentation

## 2021-07-09 DIAGNOSIS — E1169 Type 2 diabetes mellitus with other specified complication: Secondary | ICD-10-CM

## 2021-07-09 DIAGNOSIS — E782 Mixed hyperlipidemia: Secondary | ICD-10-CM

## 2021-07-09 DIAGNOSIS — J40 Bronchitis, not specified as acute or chronic: Secondary | ICD-10-CM | POA: Diagnosis not present

## 2021-07-09 DIAGNOSIS — K5900 Constipation, unspecified: Secondary | ICD-10-CM | POA: Diagnosis not present

## 2021-07-09 DIAGNOSIS — Z87891 Personal history of nicotine dependence: Secondary | ICD-10-CM | POA: Insufficient documentation

## 2021-07-09 DIAGNOSIS — F17210 Nicotine dependence, cigarettes, uncomplicated: Secondary | ICD-10-CM | POA: Diagnosis not present

## 2021-07-09 DIAGNOSIS — I480 Paroxysmal atrial fibrillation: Secondary | ICD-10-CM

## 2021-07-09 DIAGNOSIS — I2510 Atherosclerotic heart disease of native coronary artery without angina pectoris: Secondary | ICD-10-CM | POA: Diagnosis not present

## 2021-07-09 DIAGNOSIS — Z7902 Long term (current) use of antithrombotics/antiplatelets: Secondary | ICD-10-CM | POA: Diagnosis not present

## 2021-07-09 DIAGNOSIS — I110 Hypertensive heart disease with heart failure: Principal | ICD-10-CM | POA: Insufficient documentation

## 2021-07-09 DIAGNOSIS — I5032 Chronic diastolic (congestive) heart failure: Secondary | ICD-10-CM

## 2021-07-09 DIAGNOSIS — E119 Type 2 diabetes mellitus without complications: Secondary | ICD-10-CM

## 2021-07-09 LAB — CBC
HCT: 25.9 % — ABNORMAL LOW (ref 36.0–46.0)
Hemoglobin: 8.6 g/dL — ABNORMAL LOW (ref 12.0–15.0)
MCH: 30.2 pg (ref 26.0–34.0)
MCHC: 33.2 g/dL (ref 30.0–36.0)
MCV: 90.9 fL (ref 80.0–100.0)
Platelets: 267 10*3/uL (ref 150–400)
RBC: 2.85 MIL/uL — ABNORMAL LOW (ref 3.87–5.11)
RDW: 13.7 % (ref 11.5–15.5)
WBC: 12.7 10*3/uL — ABNORMAL HIGH (ref 4.0–10.5)
nRBC: 0 % (ref 0.0–0.2)

## 2021-07-09 LAB — ECHOCARDIOGRAM COMPLETE
AR max vel: 1.06 cm2
AV Area VTI: 1.09 cm2
AV Area mean vel: 1.01 cm2
AV Mean grad: 7 mmHg
AV Peak grad: 11.8 mmHg
Ao pk vel: 1.72 m/s
Area-P 1/2: 4.06 cm2
Calc EF: 42.4 %
Height: 60.5 in
S' Lateral: 2.7 cm
Single Plane A2C EF: 47.4 %
Single Plane A4C EF: 42.4 %
Weight: 3160.51 oz

## 2021-07-09 LAB — BASIC METABOLIC PANEL
Anion gap: 9 (ref 5–15)
BUN: 8 mg/dL (ref 8–23)
CO2: 23 mmol/L (ref 22–32)
Calcium: 8.7 mg/dL — ABNORMAL LOW (ref 8.9–10.3)
Chloride: 106 mmol/L (ref 98–111)
Creatinine, Ser: 0.91 mg/dL (ref 0.44–1.00)
GFR, Estimated: >60 mL/min (ref >60)
Glucose, Bld: 159 mg/dL — ABNORMAL HIGH (ref 70–99)
Potassium: 4 mmol/L (ref 3.5–5.1)
Sodium: 138 mmol/L (ref 135–145)

## 2021-07-09 LAB — CBG MONITORING, ED: Glucose-Capillary: 147 mg/dL — ABNORMAL HIGH (ref 70–99)

## 2021-07-09 LAB — LIPID PANEL
Cholesterol: 127 mg/dL (ref 0–200)
HDL: 44 mg/dL (ref >40)
LDL Cholesterol: 64 mg/dL (ref 0–99)
Total CHOL/HDL Ratio: 2.9 RATIO
Triglycerides: 94 mg/dL (ref <150)
VLDL: 19 mg/dL (ref 0–40)

## 2021-07-09 LAB — PROCALCITONIN: Procalcitonin: <0.1 ng/mL

## 2021-07-09 LAB — TROPONIN I (HIGH SENSITIVITY)
Troponin I (High Sensitivity): 67 ng/L — ABNORMAL HIGH (ref <18)
Troponin I (High Sensitivity): 91 ng/L — ABNORMAL HIGH (ref <18)
Troponin I (High Sensitivity): 73 ng/L — ABNORMAL HIGH (ref <18)

## 2021-07-09 LAB — BRAIN NATRIURETIC PEPTIDE: B Natriuretic Peptide: 762.9 pg/mL — ABNORMAL HIGH (ref 0.0–100.0)

## 2021-07-09 LAB — GLUCOSE, CAPILLARY
Glucose-Capillary: 220 mg/dL — ABNORMAL HIGH (ref 70–99)
Glucose-Capillary: 134 mg/dL — ABNORMAL HIGH (ref 70–99)
Glucose-Capillary: 134 mg/dL — ABNORMAL HIGH (ref 70–99)

## 2021-07-09 LAB — C-REACTIVE PROTEIN: CRP: 11.8 mg/dL — ABNORMAL HIGH (ref <1.0)

## 2021-07-09 LAB — D-DIMER, QUANTITATIVE: D-Dimer, Quant: 4.41 ug/mL-FEU — ABNORMAL HIGH (ref 0.00–0.50)

## 2021-07-09 IMAGING — CT CT ANGIO CHEST
2 of 7 series · 14 of 46 positions shown · IV contrast (APPLIED)
Comparison: CT abdomen and pelvis with no contrast [DATE].

CLINICAL DATA: Chest pain earlier today, since resolved.



[Series 7: thins · axial · 0.89mm/px · z∈[-282,-32]mm · 11 of 403 slices shown]
[im 23/403  lung]
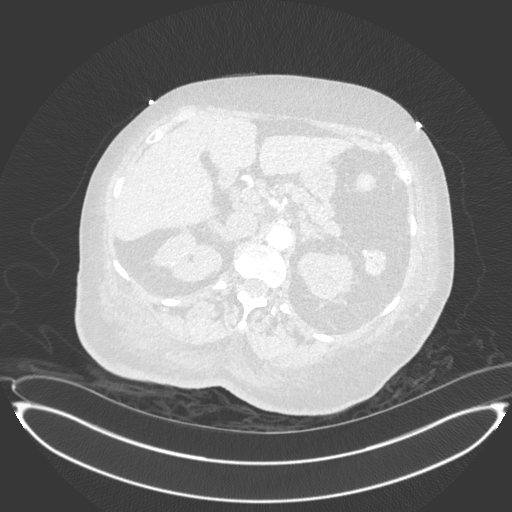
[im 68/403  soft-tissue]
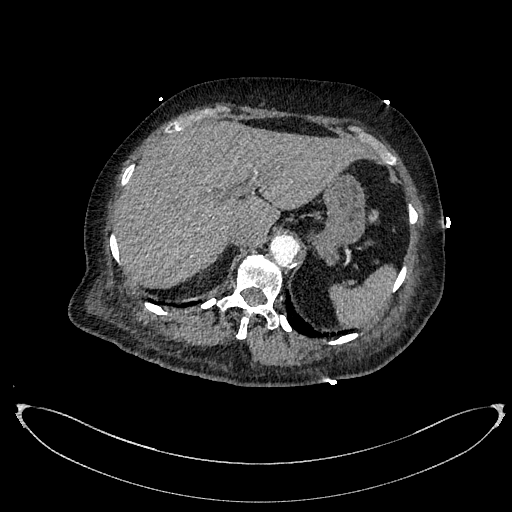
[im 90/403  lung]
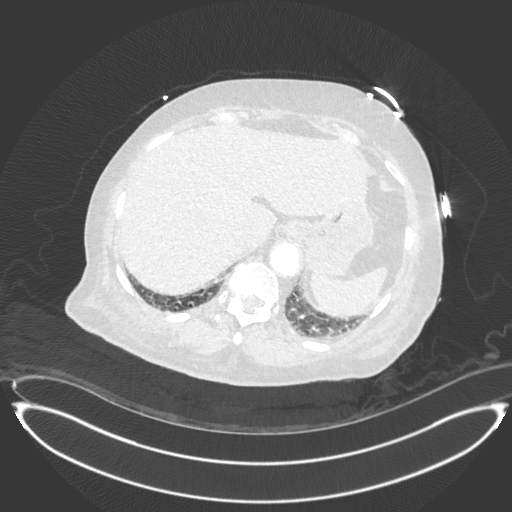
[im 135/403  soft-tissue]
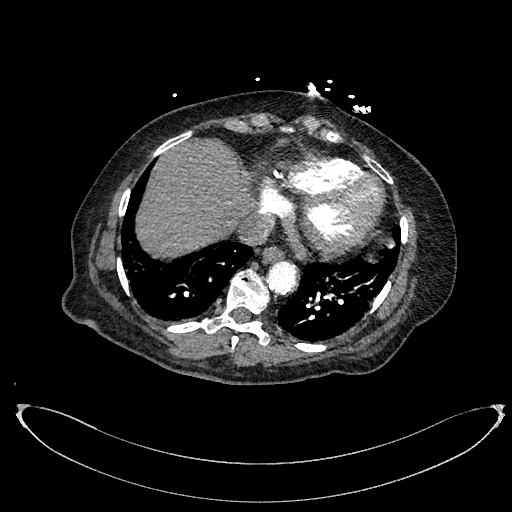
[im 157/403  lung]
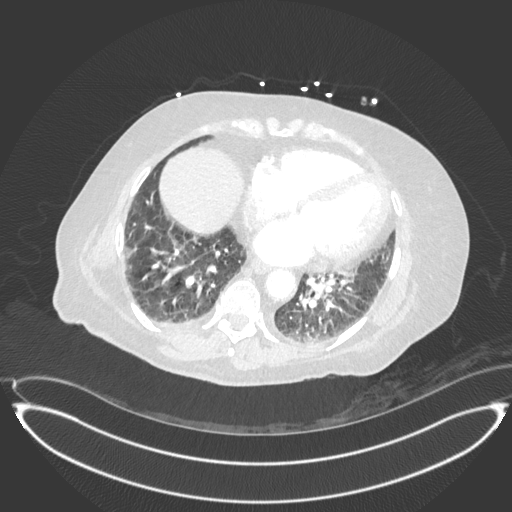
[im 202/403  soft-tissue]
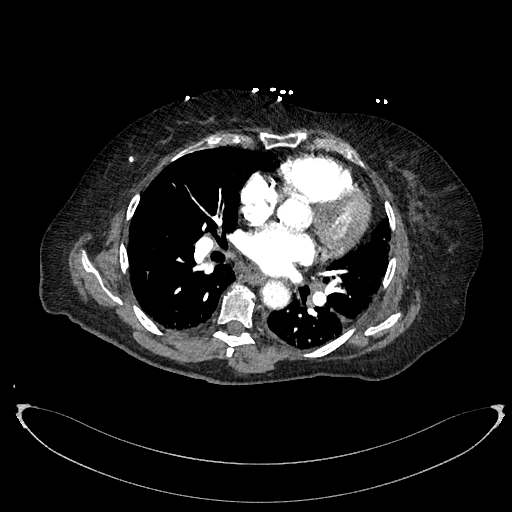
[im 246/403  lung]
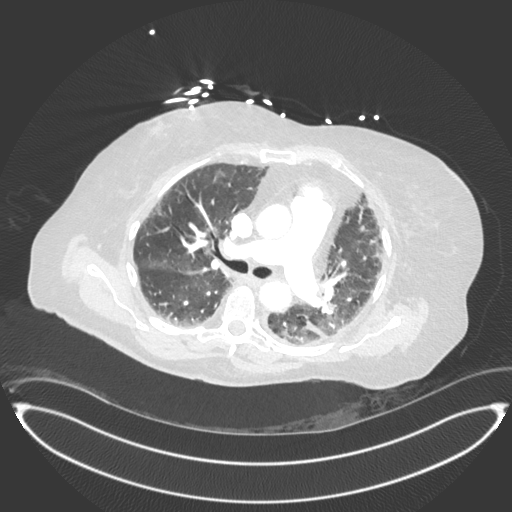
[im 269/403  soft-tissue]
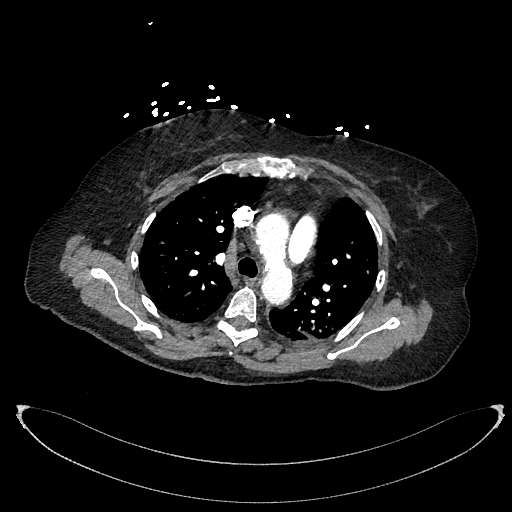
[im 313/403  lung]
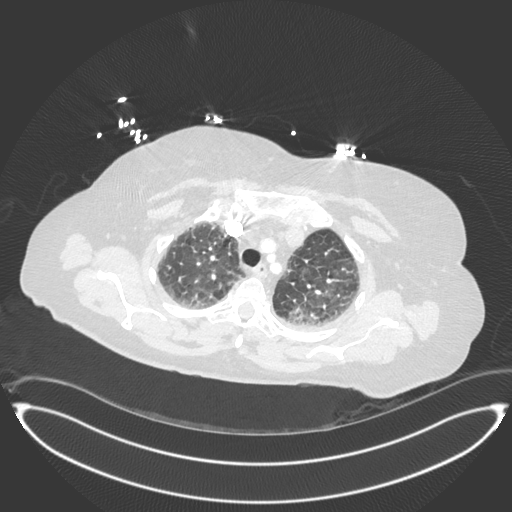
[im 336/403  soft-tissue]
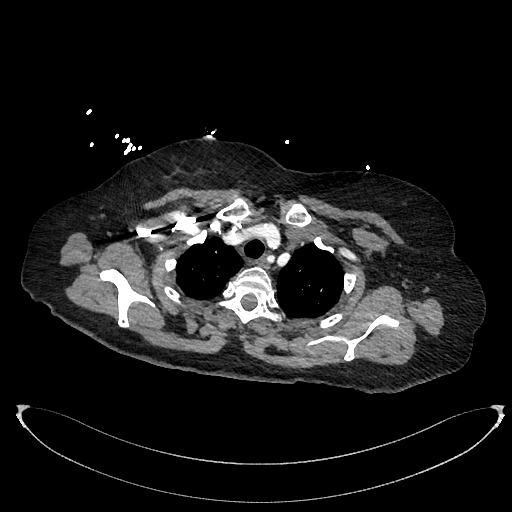
[im 380/403  lung]
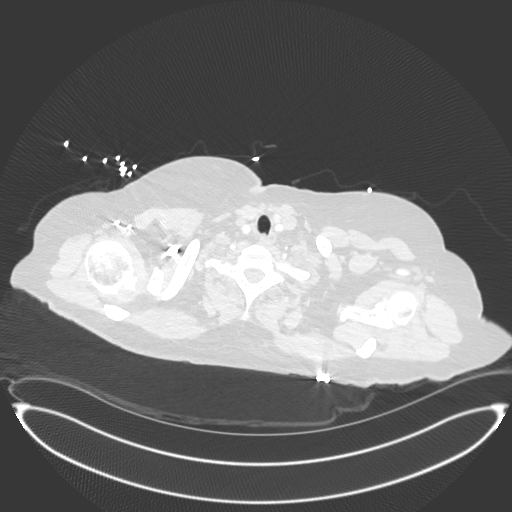

[Series 8: cor · coronal · 0.56mm/px · 3 of 156 slices shown]
[im 39/156  soft-tissue]
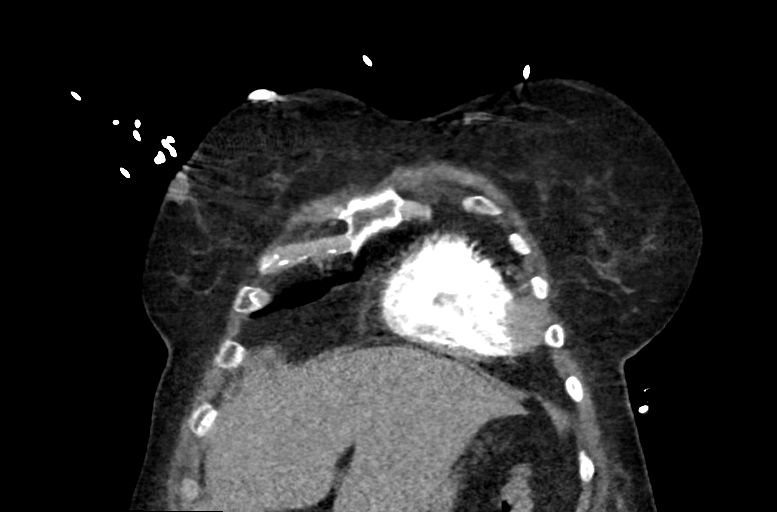
[im 78/156  soft-tissue]
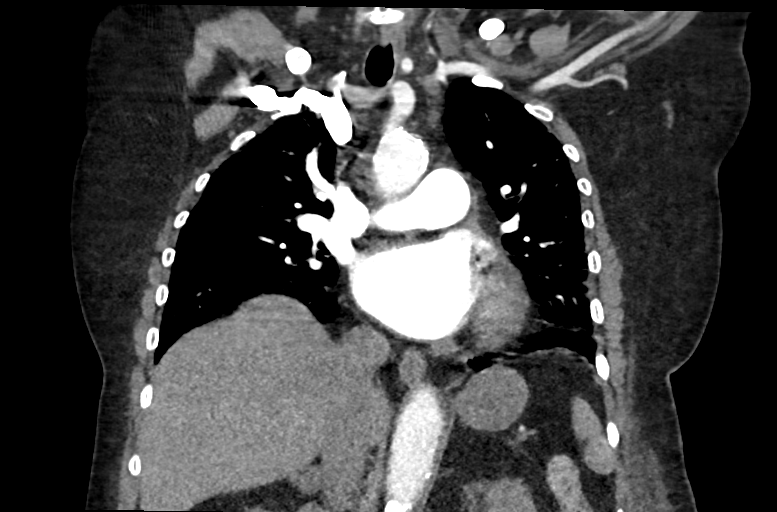
[im 117/156  soft-tissue]
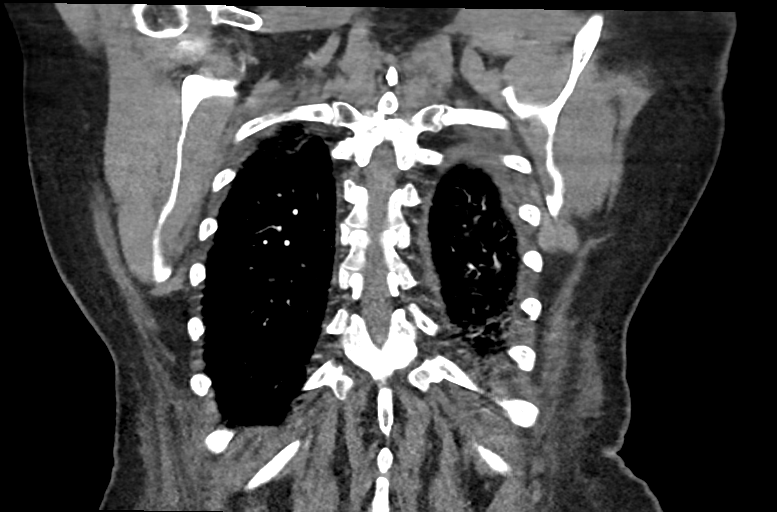

[14 of 46 positions shown; findings below may reference images not displayed]

RADIATION DOSE REDUCTION: This exam was performed according to the
departmental dose-optimization program which includes automated
exposure control, adjustment of the mA and/or kV according to
patient size and/or use of iterative reconstruction technique.

CONTRAST:  90mL OMNIPAQUE IOHEXOL 350 MG/ML SOLN
FINDINGS: CTA CHEST FINDINGS

Cardiovascular: The pulmonary arteries are upper limits of normal in
caliber with no evidence of arterial emboli. No evidence of acute
right heart strain.

There is mild cardiomegaly with left chamber predominance. There are
scattered three-vessel calcifications in the coronary arteries.

There are no pulmonary venous dilatation. There is moderate to heavy
aortic atherosclerosis, scattered calcifications in the great
vessels without stenosis. The plaques predominantly calcific in the
arch and mixed in the descending segment with scattered ulcerative
soft plaque but no penetrating ulcer or dissection or aneurysm.

Mediastinum/Nodes: There is a heterogeneous 1.1 cm nodule in the
left lobe of the thyroid gland. The lower poles of the thyroid
otherwise unremarkable. Imaging follow-up is not required for a
nodule of this size. There is no esophageal thickening.

The trachea is clear. There are shotty subcentimeter in short axis
bilateral hilar and mediastinal nodes but no enlarged intrathoracic
or axillary nodes no chest wall mass.

Lungs/Pleura: There are bilateral trace layering pleural effusions.
There is mild interstitial edema in the base and apices of both
lungs. Findings are superimposed on chronic appearing subpleural
reticulation with a basal gradient without honeycombing.

There are thickened central bronchi in the bilateral upper and left
lower lobes, and patchy haziness asymmetrically in the infrahilar
left lower lobe which could be atelectasis or bronchopneumonia.

There is additional faint patchy ground-glass opacity in the left
upper lobe posteriorly which could be ground-glass edema or
additional pneumonitis.

Rest of the lung fields are clear. There is no pneumothorax or
pleural thickening.

Musculoskeletal: There is a mild thoracic kyphodextroscoliosis,
osteopenia and degenerative change of the spine.

Review of the MIP images confirms the above findings.

CT ABDOMEN and PELVIS FINDINGS

Hepatobiliary: 17 cm length mildly steatotic liver. No mass.
Gallbladder absent without biliary dilatation.

Pancreas: Unremarkable.

Spleen: Normal in size and enhancement.

Adrenals/Urinary Tract: There is no right adrenal abnormality. There
is a 1.6 cm stable left adrenal nodule. There are small bilateral
renal cysts and a wedge-shaped scar defect in the upper pole right
kidney small calcification in the underlying cortex.

There is no other evidence of nephrolithiasis. There is no
hydronephrosis or ureteral stone no bladder thickening.

Stomach/Bowel: No dilatation or wall thickening including the
appendix. Moderate stool retention ascending and transverse colon is
seen, diffuse diverticulosis without evidence of colitis or
diverticulitis.

Vascular/Lymphatic: Heavy aortoiliac calcific disease with
additional moderate calcification at the renal artery ostia. No AAA.
No adenopathy.

Reproductive: Status post hysterectomy. No adnexal masses.

Other: Small umbilical fat hernia. No incarcerated hernia. Pelvic
phleboliths. No free air, hemorrhage or fluid.

Musculoskeletal: Dorsal midline subcutaneous fluid collection
containing air pockets is seen at the level of L2-4 measuring 3.1 x
2.5 x 4.2 cm AP, coronal and craniocaudal, respectively. There are
adjacent stranding opacities in the fat.

Interval new postsurgical changes. There is L3-5 posterior fusion
rods and pedicle screws, age indeterminate. There is scattered air
in the dorsal epidural space at L3. The hardware is grossly intact
and there are interbody bone plugs with disc space apparatus at both
levels. Mild broad-based thoracolumbar levoscoliosis is again shown.

Review of the MIP images confirms the above findings.
IMPRESSION: 1. Upper-normal pulmonary arterial calibers without evidence of
thromboemboli.
2. Cardiomegaly with interstitial edema and trace pleural effusions
compatible with mild CHF or fluid overload. No pericardial effusion.
3. Background subpleural reticulation with a basal gradient
suggesting a UIP pattern of fibrosis, mildly progressed in the bases
since [DATE] but without honeycombing.
4. Bilateral upper left lower lobe bronchitis findings with
infrahilar left lower lobe hazy opacities which could be atelectasis
or pneumonia, and ground-glass infiltrates in the posterior left
upper lobe which could be ground-glass edema or pneumonia.
5. Aortic and coronary artery atherosclerosis.
6. Constipation and diverticulosis without bowel obstruction or
inflammation.
7. No acute findings within the abdomen and pelvis proper but with
dorsal midline subcutaneous fluid collection with air pockets
overlying L2-4 and dorsal epidural tiny air pockets at L3. Has the
patient had a recent lumbar puncture or epidural injection? Also,
how recent was the L3-4 fusion surgery? Infectious process is not
excluded.
8. Stable left adrenal nodule and remaining findings discussed
above.

## 2021-07-09 IMAGING — DX DG CHEST 1V PORT
1 series · 1 of 1 positions shown · non-contrast
Comparison: PA Lat [DATE].

CLINICAL DATA: Chest pain.

EXAM:
PORTABLE CHEST 1 VIEW

[chest ap]
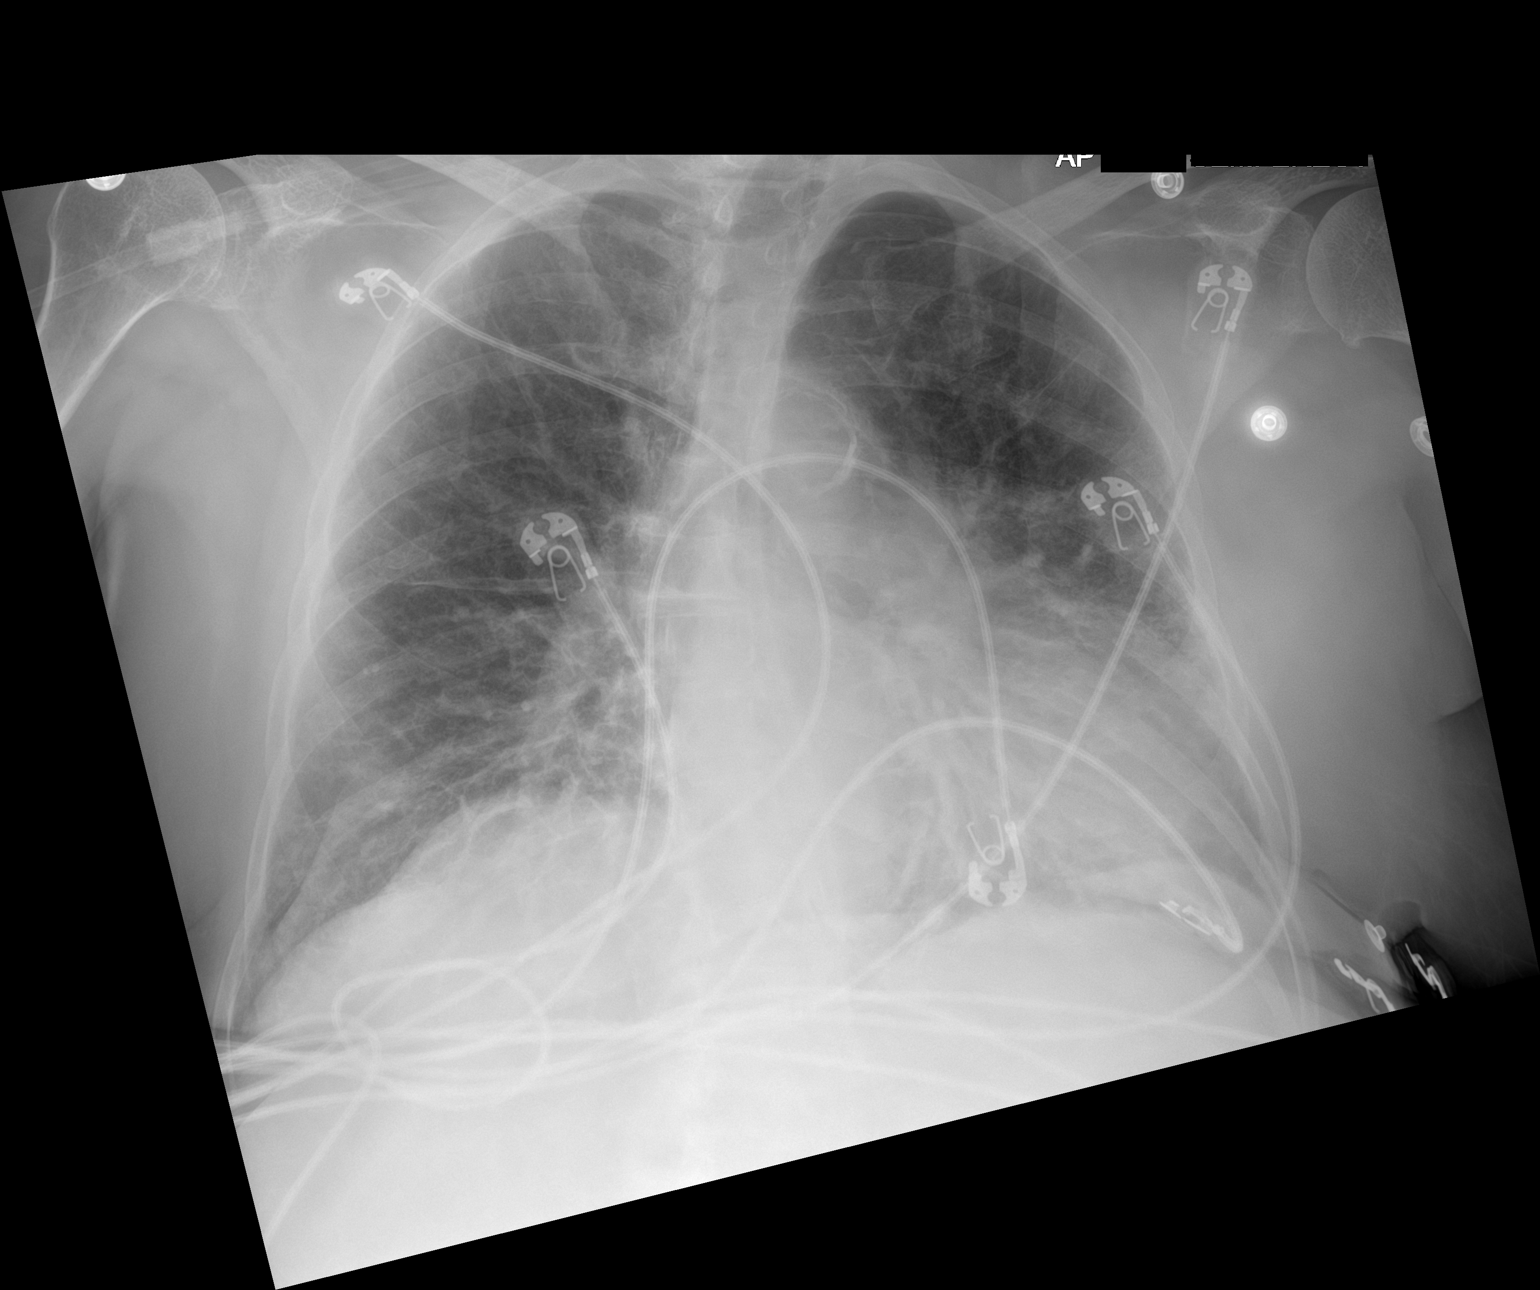

[1 of 1 positions shown; findings below may reference images not displayed]

FINDINGS: There is mild cardiomegaly and interval development of perihilar
vascular congestion and mild central and basilar interstitial edema.

Trace pleural effusions are beginning to develop. Findings
superimposed on mild chronic changes asymmetrically in the lower
lung fields more so on left. No focal alveolar infiltrate is seen.

The mediastinum is stable with aortic tortuosity. There is
dextroscoliosis and degenerative change of the thoracic spine.
IMPRESSION: 1. Cardiomegaly with mild features of CHF or fluid overload with
interstitial edema.
2. Trace pleural effusions.
3. Chronic changes.
4. Aortic atherosclerosis.

## 2021-07-09 IMAGING — CT CT L SPINE W/O CM
3 of 6 series · 15 of 33 positions shown, 18 images · IV contrast (APPLIED)
Comparison: Lumbar MRI [DATE] and radiography from 5 days ago

CLINICAL DATA: Chest pain and shortness of breath. Recent lumbar
surgery

EXAM:
CT Lumbar Spine with contrast
TECHNIQUE: 
TECHNIQUE: Multiplanar CT images of the lumbar spine were
reconstructed from contemporary CT of the Abdomen and Pelvis.
RADIATION DOSE REDUCTION: This exam was performed according to the
departmental dose-optimization program which includes automated
exposure control, adjustment of the mA and/or kV according to
patient size and/or use of iterative reconstruction technique.
CONTRAST:  None additional

[Series 6: lspine bone cor · coronal · 0.46mm/px · 3 of 41 slices shown]
[im 9/41  bone]
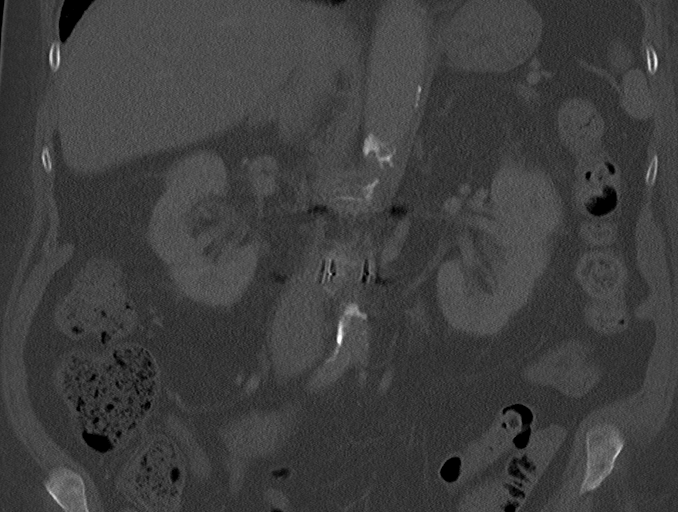
[im 17/41  bone]
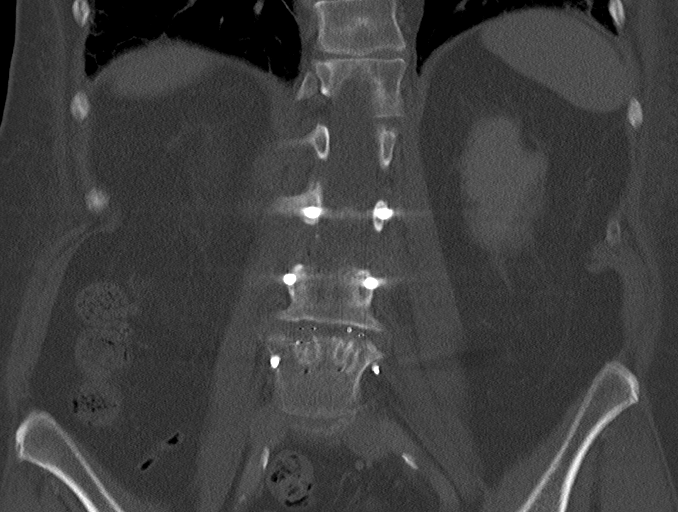
[im 25/41  bone]
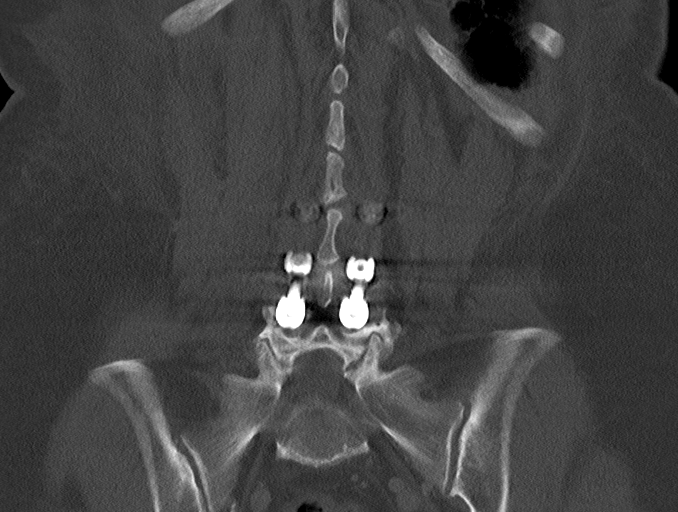

[Series 7: lspine bone sag · sagittal · 0.51mm/px · 5 of 51 slices shown, 6 images]
[im 17/51  bone]
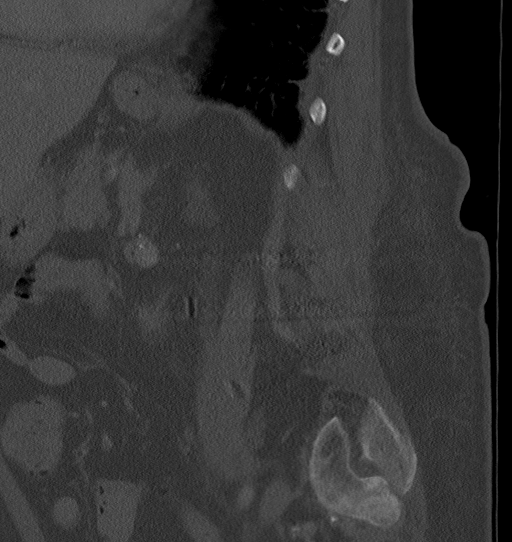
[im 21/51  bone]
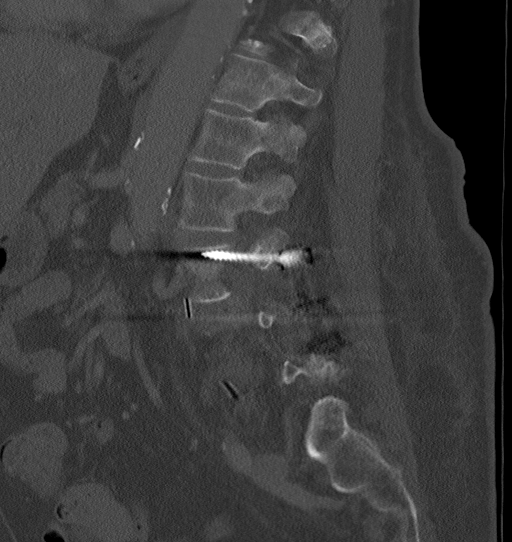
[im 26/51  soft-tissue]
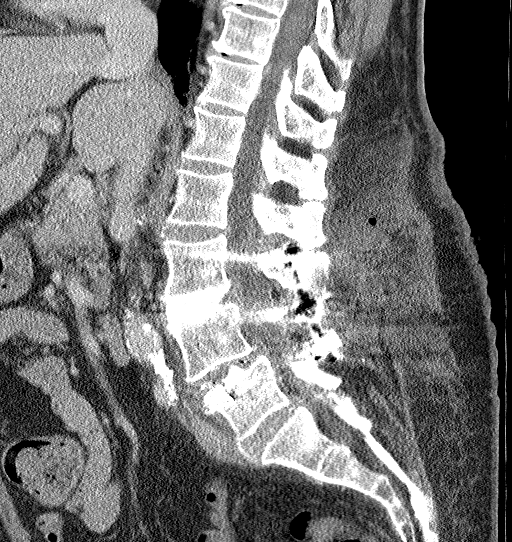
[im 26/51  bone]
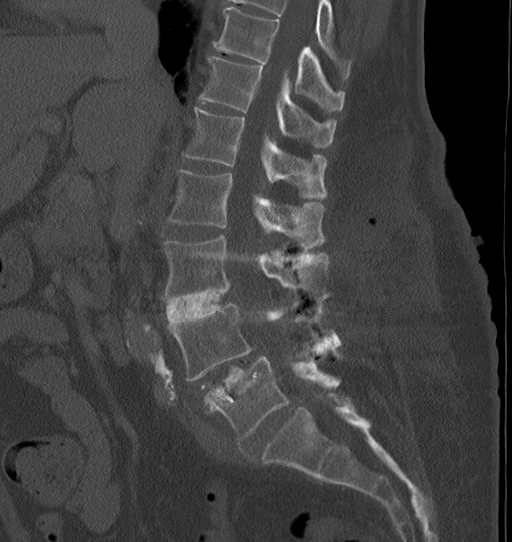
[im 30/51  bone]
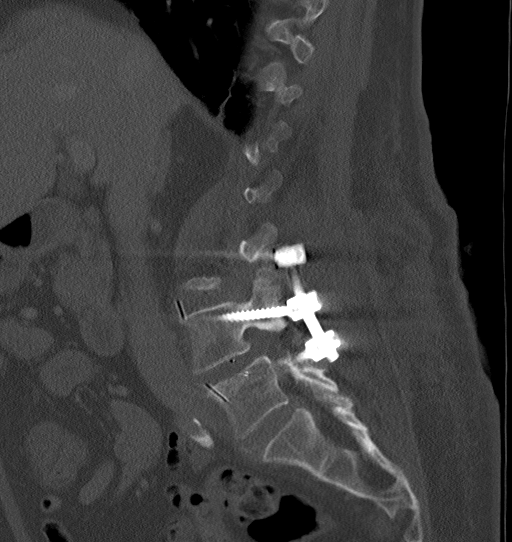
[im 34/51  bone]
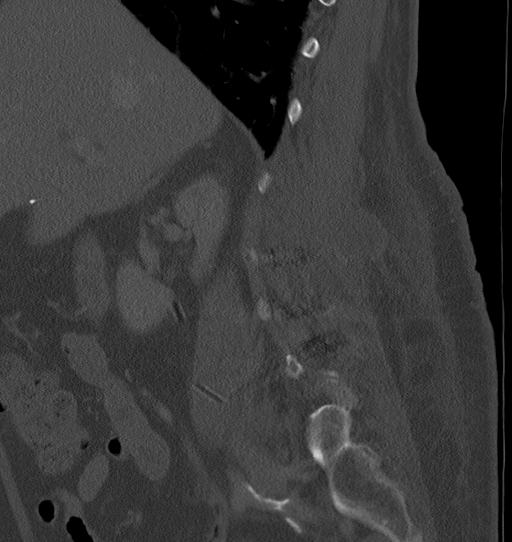

[Series 8: lspine bone thins · axial · 0.44mm/px · z∈[-448,-268]mm · 7 of 386 slices shown, 9 images]
[im 43/386  soft-tissue]
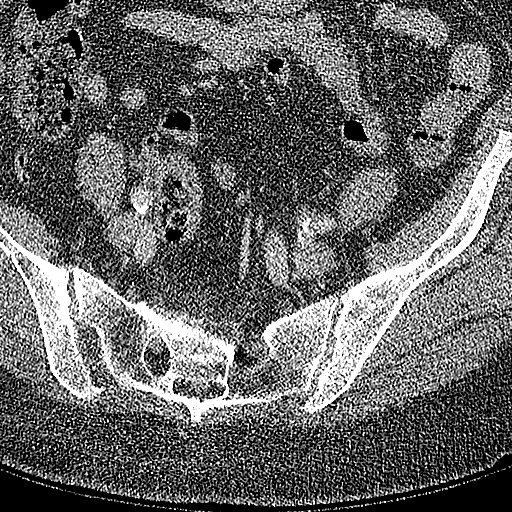
[im 43/386  bone]
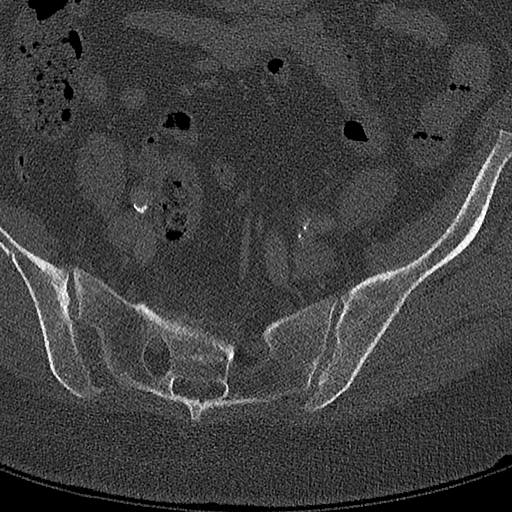
[im 86/386  bone]
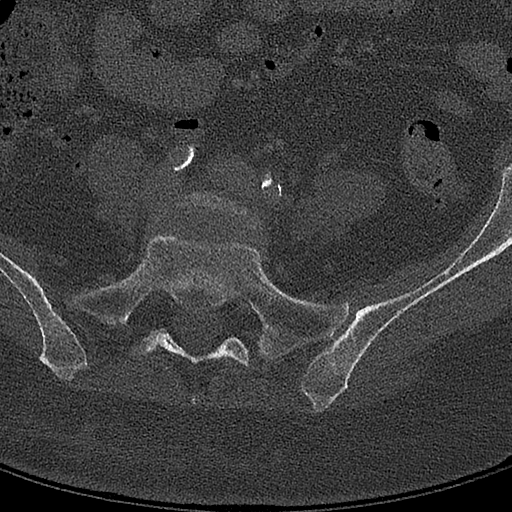
[im 129/386  bone]
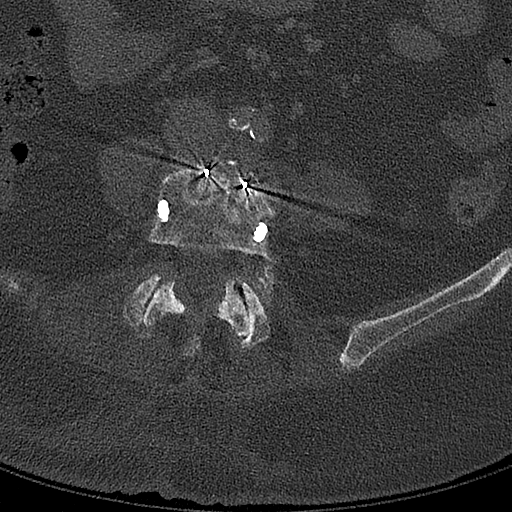
[im 214/386  bone]
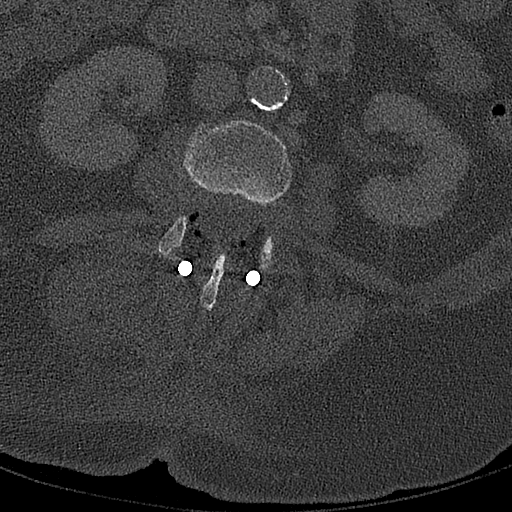
[im 257/386  soft-tissue]
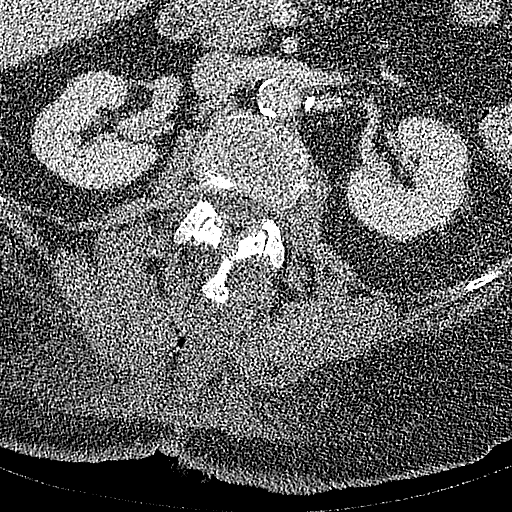
[im 257/386  bone]
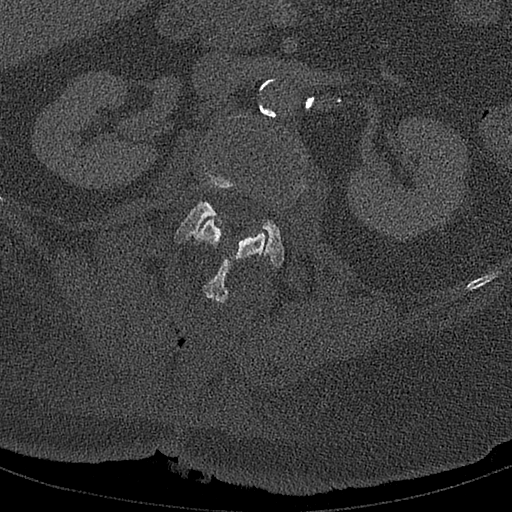
[im 300/386  bone]
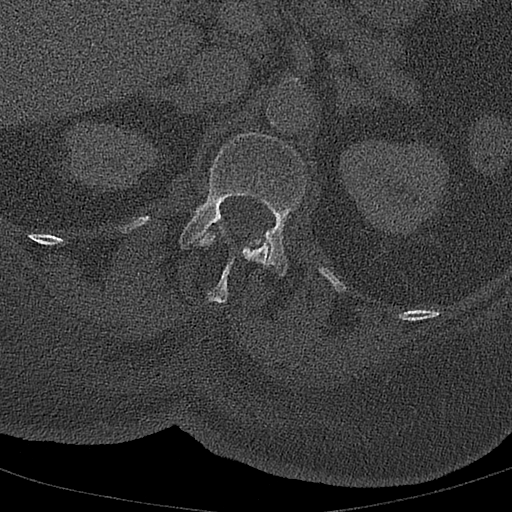
[im 343/386  bone]
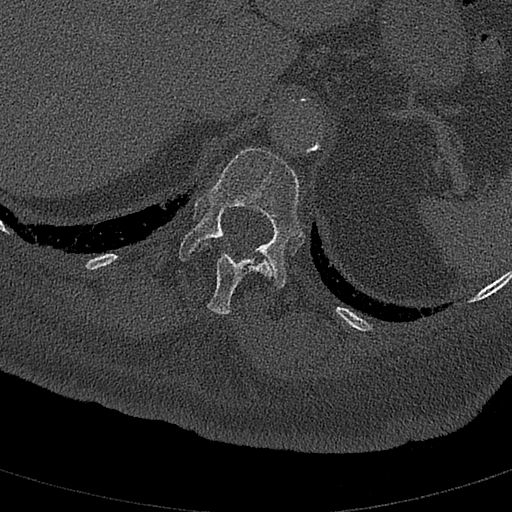

[15 of 33 positions shown; findings below may reference images not displayed]

FINDINGS: Segmentation: 5 lumbar type vertebrae

Alignment: Normal.

Vertebrae: Recent L3-4 and L4-5 PLIF. Prominent cage subsidence at
the L5 superior endplate, measuring 12 mm into the vertebral body.
No dislocation of hardware.

Paraspinal and other soft tissues: Expected operative site fluid and
gas. No compressive fluid collection seen.

Disc levels: Limited assessment of the postoperative levels due to
metallic artifact and operative soft tissue changes. No bony
impingement at these levels. Notable L5-S1 facet osteoarthritis with
spurring and anterolisthesis. Foraminal predominant disc bulging
eccentric to the left.
IMPRESSION: Recent L3-4 and L4-5 PLIF. L5 superior endplate fracture with cage
subsidence by 12 mm. No hardware dislocation.

## 2021-07-09 IMAGING — CT CT ABD-PELV W/ CM
2 of 5 series · 13 of 46 positions shown, 15 images · IV contrast (APPLIED)
Comparison: CT abdomen and pelvis with no contrast [DATE].

CLINICAL DATA: Chest pain earlier today, since resolved.



[Series 3: abdomen 5.0 · axial · 0.98mm/px · z∈[-595,-175]mm · 10 of 100 slices shown, 12 images]
[im 8/100  soft-tissue]
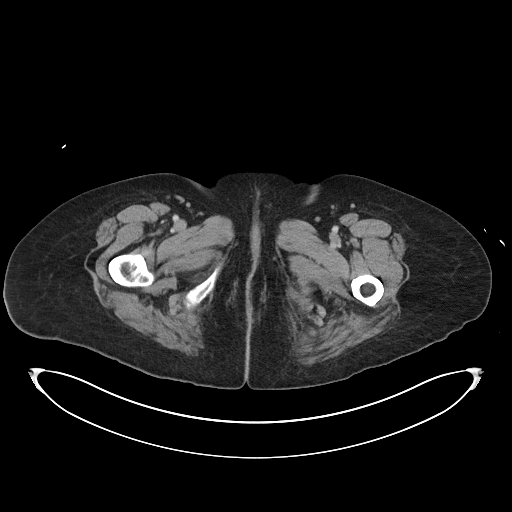
[im 8/100  bone]
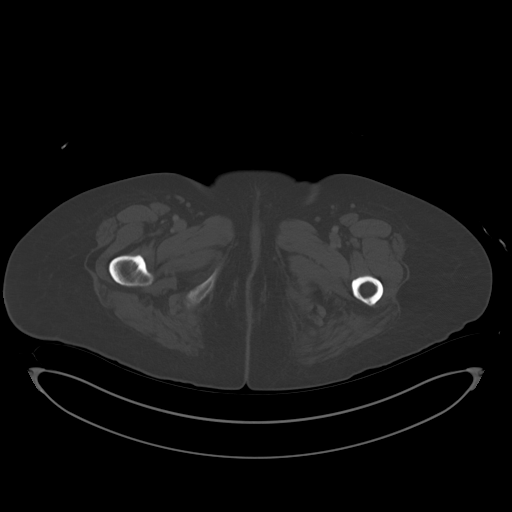
[im 16/100  soft-tissue]
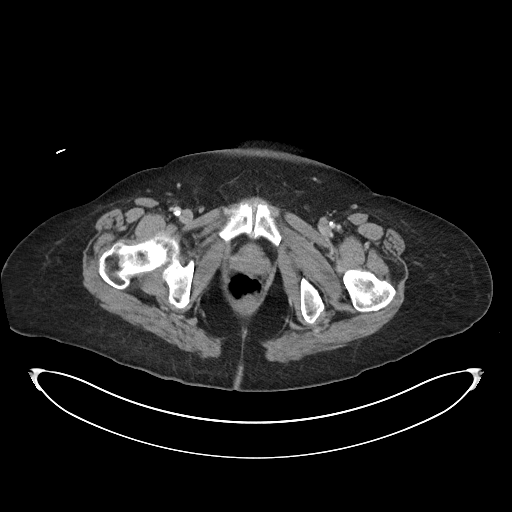
[im 31/100  soft-tissue]
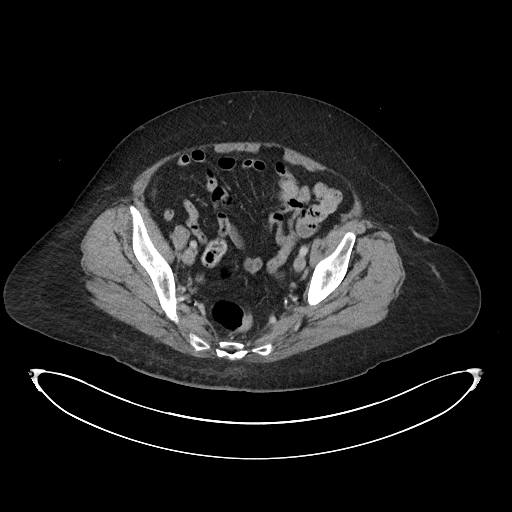
[im 39/100  soft-tissue]
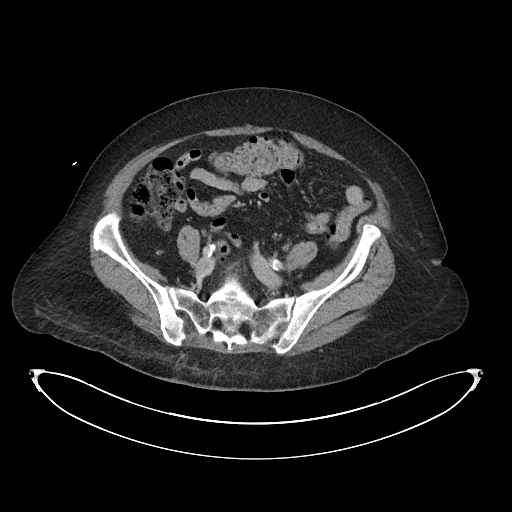
[im 46/100  soft-tissue]
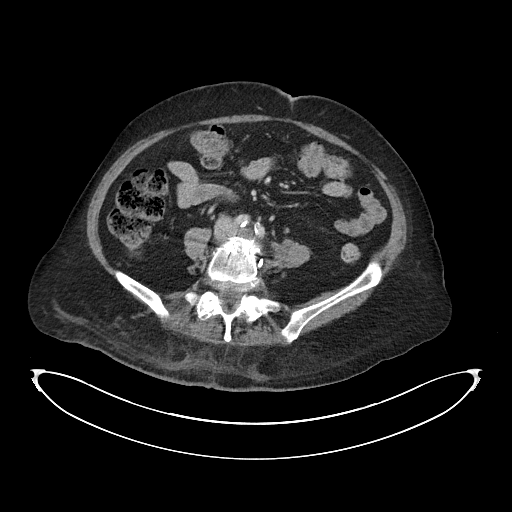
[im 54/100  soft-tissue]
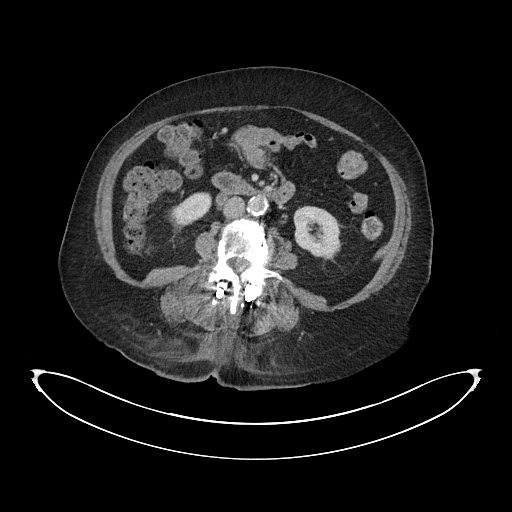
[im 61/100  soft-tissue]
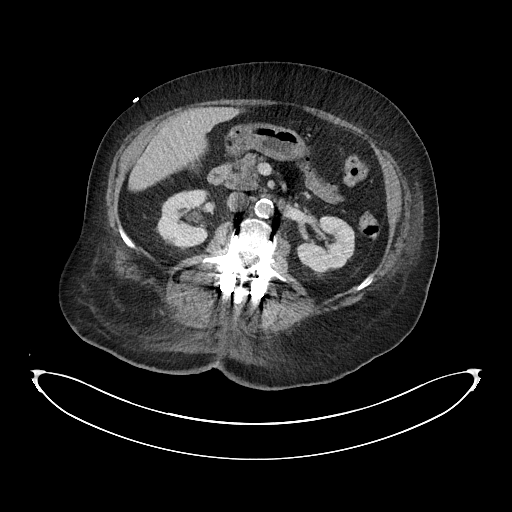
[im 77/100  soft-tissue]
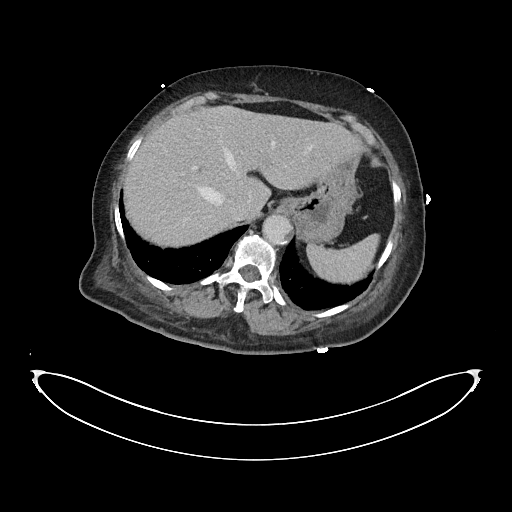
[im 84/100  soft-tissue]
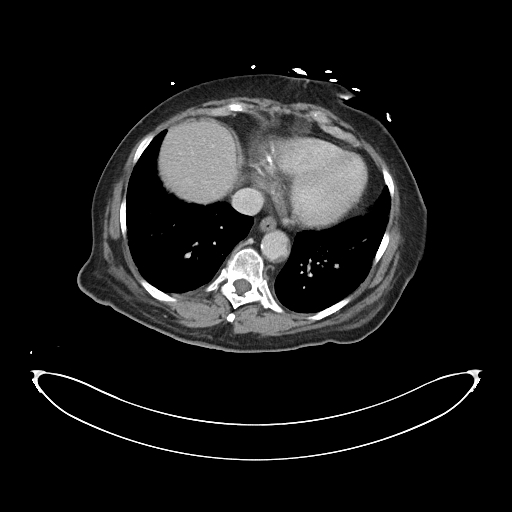
[im 84/100  bone]
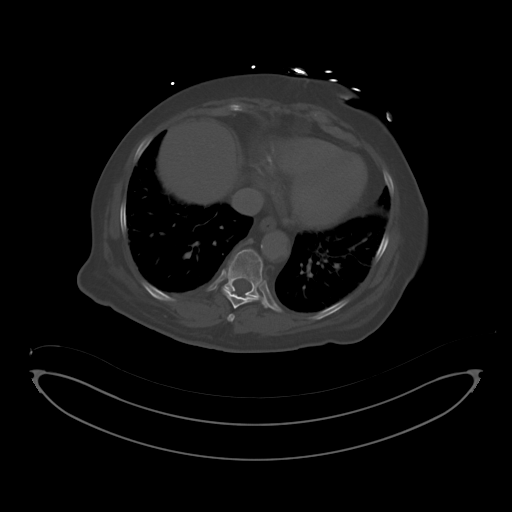
[im 92/100  soft-tissue]
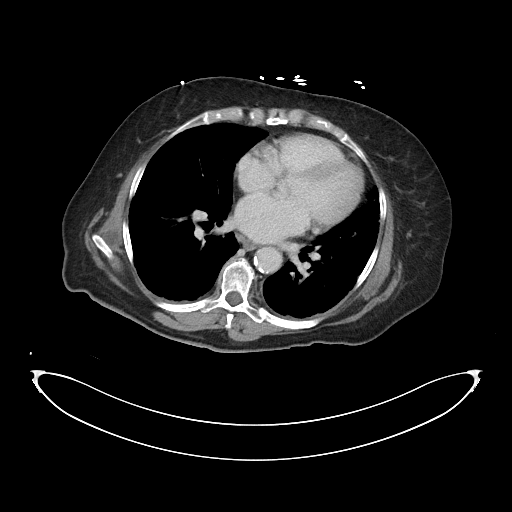

[Series 6: abdomen 3.0 mpr cor · coronal · 0.94mm/px · 3 of 115 slices shown]
[im 39/115  soft-tissue]
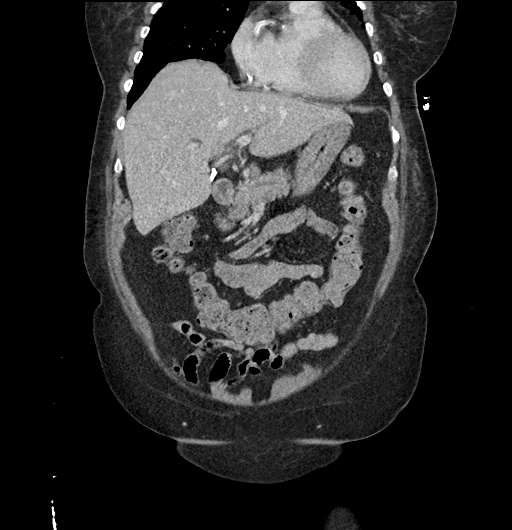
[im 51/115  soft-tissue]
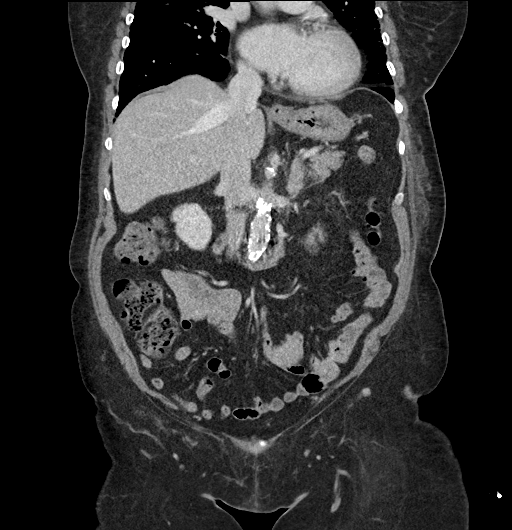
[im 64/115  soft-tissue]
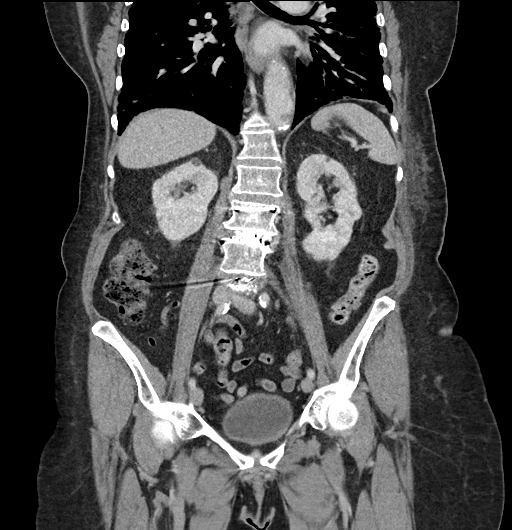

[13 of 46 positions shown; findings below may reference images not displayed]

RADIATION DOSE REDUCTION: This exam was performed according to the
departmental dose-optimization program which includes automated
exposure control, adjustment of the mA and/or kV according to
patient size and/or use of iterative reconstruction technique.

CONTRAST:  90mL OMNIPAQUE IOHEXOL 350 MG/ML SOLN
FINDINGS: CTA CHEST FINDINGS

Cardiovascular: The pulmonary arteries are upper limits of normal in
caliber with no evidence of arterial emboli. No evidence of acute
right heart strain.

There is mild cardiomegaly with left chamber predominance. There are
scattered three-vessel calcifications in the coronary arteries.

There are no pulmonary venous dilatation. There is moderate to heavy
aortic atherosclerosis, scattered calcifications in the great
vessels without stenosis. The plaques predominantly calcific in the
arch and mixed in the descending segment with scattered ulcerative
soft plaque but no penetrating ulcer or dissection or aneurysm.

Mediastinum/Nodes: There is a heterogeneous 1.1 cm nodule in the
left lobe of the thyroid gland. The lower poles of the thyroid
otherwise unremarkable. Imaging follow-up is not required for a
nodule of this size. There is no esophageal thickening.

The trachea is clear. There are shotty subcentimeter in short axis
bilateral hilar and mediastinal nodes but no enlarged intrathoracic
or axillary nodes no chest wall mass.

Lungs/Pleura: There are bilateral trace layering pleural effusions.
There is mild interstitial edema in the base and apices of both
lungs. Findings are superimposed on chronic appearing subpleural
reticulation with a basal gradient without honeycombing.

There are thickened central bronchi in the bilateral upper and left
lower lobes, and patchy haziness asymmetrically in the infrahilar
left lower lobe which could be atelectasis or bronchopneumonia.

There is additional faint patchy ground-glass opacity in the left
upper lobe posteriorly which could be ground-glass edema or
additional pneumonitis.

Rest of the lung fields are clear. There is no pneumothorax or
pleural thickening.

Musculoskeletal: There is a mild thoracic kyphodextroscoliosis,
osteopenia and degenerative change of the spine.

Review of the MIP images confirms the above findings.

CT ABDOMEN and PELVIS FINDINGS

Hepatobiliary: 17 cm length mildly steatotic liver. No mass.
Gallbladder absent without biliary dilatation.

Pancreas: Unremarkable.

Spleen: Normal in size and enhancement.

Adrenals/Urinary Tract: There is no right adrenal abnormality. There
is a 1.6 cm stable left adrenal nodule. There are small bilateral
renal cysts and a wedge-shaped scar defect in the upper pole right
kidney small calcification in the underlying cortex.

There is no other evidence of nephrolithiasis. There is no
hydronephrosis or ureteral stone no bladder thickening.

Stomach/Bowel: No dilatation or wall thickening including the
appendix. Moderate stool retention ascending and transverse colon is
seen, diffuse diverticulosis without evidence of colitis or
diverticulitis.

Vascular/Lymphatic: Heavy aortoiliac calcific disease with
additional moderate calcification at the renal artery ostia. No AAA.
No adenopathy.

Reproductive: Status post hysterectomy. No adnexal masses.

Other: Small umbilical fat hernia. No incarcerated hernia. Pelvic
phleboliths. No free air, hemorrhage or fluid.

Musculoskeletal: Dorsal midline subcutaneous fluid collection
containing air pockets is seen at the level of L2-4 measuring 3.1 x
2.5 x 4.2 cm AP, coronal and craniocaudal, respectively. There are
adjacent stranding opacities in the fat.

Interval new postsurgical changes. There is L3-5 posterior fusion
rods and pedicle screws, age indeterminate. There is scattered air
in the dorsal epidural space at L3. The hardware is grossly intact
and there are interbody bone plugs with disc space apparatus at both
levels. Mild broad-based thoracolumbar levoscoliosis is again shown.

Review of the MIP images confirms the above findings.
IMPRESSION: 1. Upper-normal pulmonary arterial calibers without evidence of
thromboemboli.
2. Cardiomegaly with interstitial edema and trace pleural effusions
compatible with mild CHF or fluid overload. No pericardial effusion.
3. Background subpleural reticulation with a basal gradient
suggesting a UIP pattern of fibrosis, mildly progressed in the bases
since [DATE] but without honeycombing.
4. Bilateral upper left lower lobe bronchitis findings with
infrahilar left lower lobe hazy opacities which could be atelectasis
or pneumonia, and ground-glass infiltrates in the posterior left
upper lobe which could be ground-glass edema or pneumonia.
5. Aortic and coronary artery atherosclerosis.
6. Constipation and diverticulosis without bowel obstruction or
inflammation.
7. No acute findings within the abdomen and pelvis proper but with
dorsal midline subcutaneous fluid collection with air pockets
overlying L2-4 and dorsal epidural tiny air pockets at L3. Has the
patient had a recent lumbar puncture or epidural injection? Also,
how recent was the L3-4 fusion surgery? Infectious process is not
excluded.
8. Stable left adrenal nodule and remaining findings discussed
above.

## 2021-07-09 MED ORDER — FUROSEMIDE 10 MG/ML IJ SOLN
20.0000 mg | Freq: Two times a day (BID) | INTRAMUSCULAR | Status: DC
  Administered 2021-07-09 – 2021-07-10 (×2): 20 mg via INTRAVENOUS
  Filled 2021-07-09 (×2): qty 2

## 2021-07-09 MED ORDER — METHOCARBAMOL 500 MG PO TABS
500.0000 mg | ORAL_TABLET | Freq: Four times a day (QID) | ORAL | Status: DC | PRN
  Administered 2021-07-09: 500 mg via ORAL
  Filled 2021-07-09: qty 1

## 2021-07-09 MED ORDER — OXYCODONE-ACETAMINOPHEN 5-325 MG PO TABS
1.0000 | ORAL_TABLET | Freq: Four times a day (QID) | ORAL | Status: DC | PRN
  Administered 2021-07-09: 1 via ORAL
  Filled 2021-07-09: qty 1

## 2021-07-09 MED ORDER — IOHEXOL 350 MG/ML SOLN
90.0000 mL | Freq: Once | INTRAVENOUS | Status: AC | PRN
  Administered 2021-07-09: 90 mL via INTRAVENOUS

## 2021-07-09 MED ORDER — ONDANSETRON HCL 4 MG/2ML IJ SOLN
4.0000 mg | Freq: Once | INTRAMUSCULAR | Status: AC
  Administered 2021-07-09: 4 mg via INTRAVENOUS
  Filled 2021-07-09: qty 2

## 2021-07-09 MED ORDER — ACETAMINOPHEN 650 MG RE SUPP
650.0000 mg | Freq: Four times a day (QID) | RECTAL | Status: DC | PRN
Start: 2021-07-09 — End: 2021-07-10

## 2021-07-09 MED ORDER — ASPIRIN EC 81 MG PO TBEC
81.0000 mg | DELAYED_RELEASE_TABLET | Freq: Every day | ORAL | Status: DC
  Administered 2021-07-09 – 2021-07-10 (×2): 81 mg via ORAL
  Filled 2021-07-09 (×2): qty 1

## 2021-07-09 MED ORDER — IRBESARTAN 300 MG PO TABS
150.0000 mg | ORAL_TABLET | Freq: Every day | ORAL | Status: DC
Start: 2021-07-09 — End: 2021-07-10
  Administered 2021-07-09 – 2021-07-10 (×2): 150 mg via ORAL
  Filled 2021-07-09 (×3): qty 1

## 2021-07-09 MED ORDER — POLYETHYLENE GLYCOL 3350 17 G PO PACK
17.0000 g | PACK | Freq: Every day | ORAL | Status: DC | PRN
  Administered 2021-07-09: 17 g via ORAL
  Filled 2021-07-09: qty 1

## 2021-07-09 MED ORDER — ROSUVASTATIN CALCIUM 5 MG PO TABS
5.0000 mg | ORAL_TABLET | Freq: Every day | ORAL | Status: DC
  Administered 2021-07-09 – 2021-07-10 (×2): 5 mg via ORAL
  Filled 2021-07-09 (×2): qty 1

## 2021-07-09 MED ORDER — ACETAMINOPHEN 325 MG PO TABS
650.0000 mg | ORAL_TABLET | Freq: Four times a day (QID) | ORAL | Status: DC | PRN
  Administered 2021-07-09 – 2021-07-10 (×2): 650 mg via ORAL
  Filled 2021-07-09 (×2): qty 2

## 2021-07-09 MED ORDER — NITROGLYCERIN 0.4 MG SL SUBL: 0.4000 mg | SUBLINGUAL_TABLET | SUBLINGUAL | Status: DC | PRN

## 2021-07-09 MED ORDER — ENOXAPARIN SODIUM 40 MG/0.4ML IJ SOSY
40.0000 mg | PREFILLED_SYRINGE | Freq: Every day | INTRAMUSCULAR | Status: DC
  Administered 2021-07-09 – 2021-07-10 (×2): 40 mg via SUBCUTANEOUS
  Filled 2021-07-09 (×2): qty 0.4

## 2021-07-09 MED ORDER — EZETIMIBE 10 MG PO TABS
10.0000 mg | ORAL_TABLET | Freq: Every day | ORAL | Status: DC
  Administered 2021-07-09 – 2021-07-10 (×2): 10 mg via ORAL
  Filled 2021-07-09 (×2): qty 1

## 2021-07-09 MED ORDER — METOPROLOL SUCCINATE ER 50 MG PO TB24
50.0000 mg | ORAL_TABLET | Freq: Every day | ORAL | Status: DC
Start: 2021-07-09 — End: 2021-07-10
  Administered 2021-07-09 – 2021-07-10 (×2): 50 mg via ORAL
  Filled 2021-07-09 (×2): qty 1

## 2021-07-09 MED ORDER — INSULIN ASPART 100 UNIT/ML IJ SOLN
0.0000 [IU] | Freq: Three times a day (TID) | INTRAMUSCULAR | Status: DC
  Administered 2021-07-09 (×2): 2 [IU] via SUBCUTANEOUS
  Administered 2021-07-09: 5 [IU] via SUBCUTANEOUS
  Administered 2021-07-10: 2 [IU] via SUBCUTANEOUS

## 2021-07-09 MED ORDER — FUROSEMIDE 10 MG/ML IJ SOLN
20.0000 mg | Freq: Once | INTRAMUSCULAR | Status: AC
  Administered 2021-07-09: 20 mg via INTRAVENOUS
  Filled 2021-07-09: qty 2

## 2021-07-09 MED ORDER — HYDRALAZINE HCL 20 MG/ML IJ SOLN: 10.0000 mg | Freq: Four times a day (QID) | INTRAMUSCULAR | Status: DC | PRN

## 2021-07-09 MED ORDER — ONDANSETRON HCL 4 MG/2ML IJ SOLN: 4.0000 mg | Freq: Four times a day (QID) | INTRAMUSCULAR | Status: DC | PRN

## 2021-07-09 MED ORDER — MIRABEGRON ER 50 MG PO TB24
50.0000 mg | ORAL_TABLET | Freq: Every day | ORAL | Status: DC
  Administered 2021-07-09 – 2021-07-10 (×2): 50 mg via ORAL
  Filled 2021-07-09 (×3): qty 1

## 2021-07-09 MED ORDER — ONDANSETRON HCL 4 MG PO TABS: 4.0000 mg | ORAL_TABLET | Freq: Four times a day (QID) | ORAL | Status: DC | PRN

## 2021-07-09 NOTE — Progress Notes (Signed)
? ?  Echocardiogram ?2D Echocardiogram has been performed. ? ?Beryle Beams ?07/09/2021, 1:38 PM ?

## 2021-07-09 NOTE — Assessment & Plan Note (Addendum)
?   Patient has a longstanding history of heart disease extending over 20 years but has never formally been diagnosed with congestive heart failure ?? Patient has not had an ischemic assessment or echocardiogram performed in several years ?? Patient just underwent a prolonged neurosurgical procedure several days ago during which she undoubtedly received a good deal of intravenous fluids ?? Patient has been unable to exert herself in the past few days due to substantial back pain to report exertional dyspnea however the episode of severe shortness of breath and chest discomfort that prompted her arrival of EMS is very likely an episode of paroxysmal nocturnal dyspnea due to cardiogenic volume overload ?? Patient additionally exhibits markedly elevated BNP and evidence of pulmonary edema on chest imaging ?? Physical exam also reveals bibasilar rales and pitting lower extremity edema edema ?? Patient has been initiated on Lasix 20 mg IV by the emergency room staff, will continue to provide this twice daily for now ?? Obtaining echocardiogram ?? Monitoring patient on telemetry ? ?

## 2021-07-09 NOTE — Assessment & Plan Note (Signed)
.   Resume patients home regimen of oral antihypertensives . Titrate antihypertensive regimen as necessary to achieve adequate BP control . PRN intravenous antihypertensives for excessively elevated blood pressure   

## 2021-07-09 NOTE — TOC Progression Note (Addendum)
Transition of Care (TOC) - Progression Note  ? ? ?Patient Details  ?Name: Shandale Malak ?MRN: [id number] ?Date of Birth: March 22, 1946 ? ?Transition of Care (TOC) CM/SW Contact  ?Zenon Mayo, RN ?Phone Number: ?07/09/2021, 3:05 PM ? ?Clinical Narrative:    ?from home with spouse, chest pain, ASA/ Nitro, Cards to see, conts on IV lasix, has had recent back surgery.  NCM offered choice for HHPT, patient chose [facility] as first choice and [facility] as second choice.  NCM made referral to [facility] with [facility] , awaiting call back. Patient uses the  [facility] on [address] will provide transport home at discharge.  Patient uses a walker at home, she has a 3 n 1 and she has grab bars on her bath tub.  TOC will continue to follow for dc needs. Per Corene Cornea they can not take referral.  NCM made referral to patient's second choice, [facility] with Tommi Rumps, he is able to take referral.  Soc will begin 24 to 48 hrs post dc.  ? ? ?  ?  ? ?Expected Discharge Plan and Services ?  ?  ?  ?  ?  ?                ?  ?  ?  ?  ?  ?  ?  ?  ?  ?  ? ? ?Social Determinants of Health (SDOH) Interventions ?  ? ?Readmission Risk Interventions ?   ? View : No data to display.  ?  ?  ?  ? ? ?

## 2021-07-09 NOTE — Assessment & Plan Note (Addendum)
?   Patient presenting with intense pressure-like chest discomfort  ?? Episode occurred during an episode of what seems to be paroxysmal nocturnal dyspnea ?? High-sensitivity troponin slightly elevated which we are cycling to determine peak although this may be elevated due to acute cardiogenic volume overload ?? No evidence of dynamic ECG changes ?? Chest imaging seems consistent with acute cardiogenic pulm edema with no evidence of pulmonary embolism ?? Patient currently chest pain-free ?? We will request cardiology consultation to determine if there is any indication for noninvasive ischemic assessment during this hospitalization prior to discharge ?? Since we are actively diuresing the patient no need to make patient n.p.o. for now ?? Obtaining echocardiogram ?? Providing daily aspirin therapy, Metoprolol ?? As needed nitroglycerin for further episodes of chest discomfort ? ?

## 2021-07-09 NOTE — Evaluation (Signed)
Occupational Therapy Evaluation Patient Details Name: Debbie [name] MRN: [id number] DOB: [birth year] Today's Date: 07/09/2021   History of Present Illness 76 year old female admitted 3/27 who presents to [facility] emergency department with complaints of chest discomfort and shortness of breath.  She was admitted for evaluation for acute heart failure and chest pain. Pt just d/c'd home on 3/23 after L3-L5 fusion.  Neuro reviewed CT that showed superior endplate fracture of L5 with subsidence of the grafts into the vertebral body by a about 10 mm with no hardware loosening and neuro ok'd PT/OT.   PMH:  remote stroke (1998), coronary artery disease (3 stents to RCA 2001, last cath 2006 showed stents patent), non insulin dependent diabetes mellitus type 2, hyperlipidemia, hypertension, paroxysmal atrial fibrillation (Dx 2013, not on anticoagulation)   Clinical Impression   Prior to this admission, patient was living independently with husband. Patient recently admitted for back surgery on 07/05/21, and discharged without therapy services due to level of function. Currently, patient presenting with pain, poor activity tolerance, minimal STM deficits (per husband this is baseline), min A for transfers, and mod A for ADLs. OT recommending HHOT services due to current level of function. OT will continue to follow acutely in order to address deficits listed below.      Recommendations for follow up therapy are one component of a multi-disciplinary discharge planning process, led by the attending physician.  Recommendations may be updated based on patient status, additional functional criteria and insurance authorization.   Follow Up Recommendations  Home health OT    Assistance Recommended at Discharge Intermittent Supervision/Assistance  Patient can return home with the following A little help with walking and/or transfers;A little help with bathing/dressing/bathroom;Assistance with  cooking/housework;Direct supervision/assist for medications management;Direct supervision/assist for financial management;Assist for transportation;Help with stairs or ramp for entrance    Functional Status Assessment  Patient has had a recent decline in their functional status and demonstrates the ability to make significant improvements in function in a reasonable and predictable amount of time.  Equipment Recommendations  None recommended by OT (patient has DME needed)    Recommendations for Other Services       Precautions / Restrictions Precautions Precautions: Back Precaution Booklet Issued: Yes (comment) Precaution Comments: Reviewed precautions and pt verbalized them all Required Braces or Orthoses: Spinal Brace Spinal Brace: Lumbar corset;Applied in sitting position Restrictions Weight Bearing Restrictions: No      Mobility Bed Mobility               General bed mobility comments: Up in chair upon arrival    Transfers Overall transfer level: Needs assistance Equipment used: Rolling walker (2 wheels) Transfers: Sit to/from Stand Sit to Stand: Min assist           General transfer comment: Good use of bilateral hand rails to power up into standing, increased time needed, good eccentric control to return to sitting      Balance Overall balance assessment: Needs assistance Sitting-balance support: Feet supported, No upper extremity supported Sitting balance-Leahy Scale: Fair     Standing balance support: No upper extremity supported, During functional activity, Bilateral upper extremity supported, Reliant on assistive device for balance Standing balance-Leahy Scale: Poor Standing balance comment: could not stand without UE support and without RW.                           ADL either performed or assessed with clinical judgement   ADL  Overall ADL's : Needs assistance/impaired Eating/Feeding: Set up;Sitting   Grooming: Set up;Sitting    Upper Body Bathing: Set up;Sitting   Lower Body Bathing: Sitting/lateral leans;Moderate assistance   Upper Body Dressing : Set up;Sitting   Lower Body Dressing: Moderate assistance;Sitting/lateral leans   Toilet Transfer: Minimal assistance;Ambulation;BSC/3in1;Rolling walker (2 wheels)   Toileting- Clothing Manipulation and Hygiene: Sitting/lateral lean;Minimal assistance       Functional mobility during ADLs: Minimal assistance;Rolling walker (2 wheels);Cueing for safety;Cueing for sequencing General ADL Comments: Patient presenting with decreased activity tolerance, and decreased cognition     Vision Baseline Vision/History: 1 Wears glasses Ability to See in Adequate Light: 0 Adequate Patient Visual Report: No change from baseline       Perception     Praxis      Pertinent Vitals/Pain Pain Assessment Pain Assessment: Faces Faces Pain Scale: Hurts little more Pain Location: Incision Pain Descriptors / Indicators: Aching, Grimacing, Guarding Pain Intervention(s): Limited activity within patient's tolerance, Monitored during session, Repositioned     Hand Dominance Right   Extremity/Trunk Assessment Upper Extremity Assessment Upper Extremity Assessment: Overall WFL for tasks assessed   Lower Extremity Assessment Lower Extremity Assessment: Defer to PT evaluation   Cervical / Trunk Assessment Cervical / Trunk Assessment: Back Surgery   Communication Communication Communication: No difficulties   Cognition Arousal/Alertness: Awake/alert Behavior During Therapy: WFL for tasks assessed/performed Overall Cognitive Status: History of cognitive impairments - at baseline                                 General Comments: Patient stating it was 2003, unable to recall the L in the acronym "BLT" for her back precautions, husband telling NT that patient has memory impairments at baseline     General Comments  VSS    Exercises     Shoulder  Instructions      Home Living Family/patient expects to be discharged to:: Private residence Living Arrangements: Spouse/significant other Available Help at Discharge: Family;Available 24 hours/day (husband retired, he does go to gym, son close by) Type of Home: House Home Access: Stairs to enter Secretary/administrator of Steps: 1-2 Entrance Stairs-Rails: None Home Layout: One level     Bathroom Shower/Tub: Producer, television/film/video: Standard Bathroom Accessibility: Yes How Accessible: Accessible via walker Home Equipment: Rollator (4 wheels);Rolling Walker (2 wheels);Cane - single point;BSC/3in1;Shower seat;Grab bars - tub/shower          Prior Functioning/Environment Prior Level of Function : Independent/Modified Independent             Mobility Comments: Modif I walking in house with rollator ADLs Comments: I with B/D before surgery, hasnt had shower since she went home on 3/23.  Did sponge bathe once at home and pt states she did it on her own.        OT Problem List: Decreased strength;Decreased activity tolerance;Impaired balance (sitting and/or standing);Decreased cognition;Decreased safety awareness;Decreased coordination;Decreased knowledge of use of DME or AE;Cardiopulmonary status limiting activity;Decreased knowledge of precautions;Pain;Increased edema      OT Treatment/Interventions: Self-care/ADL training;Therapeutic exercise;Energy conservation;DME and/or AE instruction;Manual therapy;Therapeutic activities;Cognitive remediation/compensation;Patient/family education;Balance training    OT Goals(Current goals can be found in the care plan section) Acute Rehab OT Goals Patient Stated Goal: to get better OT Goal Formulation: With patient Time For Goal Achievement: 07/23/21 Potential to Achieve Goals: Good  OT Frequency: Min 3X/week    Co-evaluation  AM-PAC OT "6 Clicks" Daily Activity     Outcome Measure Help from another person  eating meals?: A Little Help from another person taking care of personal grooming?: A Little Help from another person toileting, which includes using toliet, bedpan, or urinal?: A Little Help from another person bathing (including washing, rinsing, drying)?: A Lot Help from another person to put on and taking off regular upper body clothing?: A Little Help from another person to put on and taking off regular lower body clothing?: A Lot 6 Click Score: 16   End of Session Equipment Utilized During Treatment: Back brace;Rolling walker (2 wheels) Nurse Communication: Mobility status  Activity Tolerance: Patient tolerated treatment well Patient left: in chair;with call bell/phone within reach  OT Visit Diagnosis: Unsteadiness on feet (R26.81);Other abnormalities of gait and mobility (R26.89);Muscle weakness (generalized) (M62.81);Pain Pain - part of body:  (Back)                Time: 4098-1191 OT Time Calculation (min): 25 min Charges:  OT General Charges $OT Visit: 1 Visit OT Evaluation $OT Eval Moderate Complexity: 1 Mod  Pollyann Glen E. Eda Magnussen, OTR/L Acute Rehabilitation Services [phone]   Jalon Isherwood 07/09/2021, 3:29 PM

## 2021-07-09 NOTE — Assessment & Plan Note (Signed)
?   Continuing home regimen of lipid lowering therapy. ?? Obtaining lipid panel ? ?

## 2021-07-09 NOTE — ED Triage Notes (Signed)
Patient BIB EMS for chest pain x 3 hours. Patient states pain has since resolved after taking 325 of asprin and 0.4of nitro. Patient is on 3L of 02 placed by ems . Statres patient is also having SOB. Airway is clear , respirations are even and non labored . No respiratory distress noted.  ?

## 2021-07-09 NOTE — Progress Notes (Signed)
Patient is now 5 days status post two-level posterior lumbar interbody fusion at L3-4 and L4-5.  She has appropriate back soreness.  She complains of some left leg aching in the anterior thigh.  All of this is within normal limits 5 days after two-level fusion.  She was admitted for shortness of breath.  Her incision looks good.  Her strength is good. ? ?Looked at her CT scan and she has a superior endplate fracture of L5 with subsidence of the grafts into the vertebral body by a about 10 mm.  There is no hardware loosening or hardware failure at this point.  L3-4 looks very good. ? ?Mobilize as tolerated.  May benefit from physical and Occupational Therapy. ?

## 2021-07-09 NOTE — Assessment & Plan Note (Signed)
?   Remote history, records refer to this Dx as early as 2013 ?? Not on anticoagulation in the outpatient setting ?? Currently in normal sinus rhythm and rate controlled ?? Continue metoprolol ? ?

## 2021-07-09 NOTE — Consult Note (Addendum)
?Cardiology Consultation:  ? ?Patient ID: Debbie [name] ?MRN: [id number]; DOB: February 08, 1946 ? ?Admit date: 07/09/2021 ?Date of Consult: 07/09/2021 ? ?PCP:  Debbie Mariscal, MD ?  ?[facility] Providers ?Cardiologist:  Debbie Majestic, MD      ? ? ?Patient Profile:  ? ?Debbie [name] is a 76 y.o. female with a hx of CAD/s/p PCI, DM-2, CVA, HTN, HLD mild obesity who is being seen 07/09/2021 for the evaluation of chest pai and CHF at the request of Dr. Waldron Labs. ? ?History of Present Illness:  ? ?Ms. Marinaro with above hx and in 2001 ACS with occlusion of her RCA with placement of 3 stents, cath in 2006 with patent stents.  Hx of atrial tachycardia.  Last nuc study 2017 for lt sided chest pressure with neg nuc study.   Last echo 2013 with EF 55% and otherwise normal.   ? ?PAF in 2013 converted in ER but with an isolated episode of a fib it not felt she needed further anticoagulation.  She was to wear a monitor but do not see one placed.  No PAF since that I can find.  ? ?Hospitalized at [facility] 3/22 to 3/23 for  decompressive lumbar laminectomy, Hemi facetectomy and foraminotomy of L3-L4 and L4-L5 along with posterior lumbar interbody fusion of L3-L4 and L4-L5 as well as posterior fixation of L3-L5,  she did well with the procedure.   ? ?Now admitted today after presenting with SOB severe associated with chest discomfort this woke her up and she presented to ER at 1;39.  She took NTG and 4 baby ASA prior to visit.  This did help discomfort. ? ?EKG:  The EKG was personally reviewed and demonstrates:  SR with no acute changes from 03/13/21 ?Telemetry:  Telemetry was personally reviewed and demonstrates:  SR ? ?The ddimer was elevated at 4.41. but CTA neg for PE or PNA.  BNP was elevated at 762.9  ?Hs troponin 67 and 91.   ?CRP 11.8 ?Hgb 8.6 and plts 267 WBC 12.7 ?Tchol 127, HDL 44, LDL 64 TG 94.   ?On CTA there are scattered 3 vessel calcifications in the coronary arteries.   ? ?20 mg IV lasix was given.   ?Neurosurgery did follow up  for her spinal surgery and stable.   ? ?BP 168/79 to 140/58  P 70s Temp today 99.6  R 18  ? ?If I&O correct she is +530  ?She has ordered 20 mg IV lasix daily she tells me she has used BSC several times and feels much better.  ? ? ?Past Medical History:  ?Diagnosis Date  ? Arthritis   ? CAD (coronary artery disease) 11/1999  ? RCA stent X3, patent '06. Nuc low risk 9/13  ? Cataract   ? beginning stages  ? Diabetes mellitus without complication ([place])   ? Dysrhythmia   ? History of stress test 01/08/2012  ? Showed minimal anterior thinning not felt to be significant.  ? Hx of echocardiogram 12/12/2011  ? EF>55%  ? Hypercholesteremia   ? Hypertension   ? Myocardial infarction [facility])   ? 35 years ago    age 60  ? PAF (paroxysmal atrial fibrillation) ([place]) 11/2011  ? SSS component with some bradycardia  ? Stroke [facility]) 1998  ? Vitamin D deficiency   ? ? ?Past Surgical History:  ?Procedure Laterality Date  ? ABDOMINAL HYSTERECTOMY    ? CATARACT EXTRACTION, BILATERAL    ? CHOLECYSTECTOMY    ? COLONOSCOPY    ? CORONARY  ANGIOGRAM  08/2004  ? patent stents  ? CORONARY ANGIOPLASTY WITH STENT PLACEMENT  11/1999  ? X 3  ? EYE SURGERY    ? MULTIPLE TOOTH EXTRACTIONS    ? ROBOTIC ASSISTED TOTAL HYSTERECTOMY WITH BILATERAL SALPINGO OOPHERECTOMY Bilateral 03/29/2020  ? Procedure: XI ROBOTIC ASSISTED TOTAL HYSTERECTOMY WITH BILATERAL SALPINGO OOPHORECTOMY;  Surgeon: Debbie Louis, MD;  Location: [place];  Service: Gynecology;  Laterality: Bilateral;  Debbie [name] to RNFA confirmed on 02/16/20 CS  ?  ? ?Home Medications:  ?Prior to Admission medications   ?Medication Sig Start Date End Date Taking? Authorizing Provider  ?acetaminophen (TYLENOL) 500 MG tablet Take 2 tablets (1,000 mg total) by mouth every 8 (eight) hours as needed. 03/30/20  Yes Debbie Louis, MD  ?aspirin EC 81 MG tablet Take 1 tablet (81 mg total) by mouth daily. 03/01/16  Yes Troy Sine, MD  ?ezetimibe (ZETIA) 10 MG tablet Take 1 tablet by mouth once daily 06/15/21  Yes Troy Sine, MD  ?furosemide (LASIX) 20 MG tablet Take 1 tablet by mouth once daily 06/15/21  Yes Troy Sine, MD  ?HYDROcodone-acetaminophen (NORCO/VICODIN) 5-325 MG tablet Take 1 tablet by mouth every 4 (four) hours as needed for moderate pain. 07/05/21 07/05/22 Yes Meyran, Ocie Cornfield, NP  ?metFORMIN (GLUCOPHAGE) 500 MG tablet Take 500 mg by mouth 2 (two) times daily. 03/23/21  Yes [provider]  ?methocarbamol (ROBAXIN) 500 MG tablet Take 1 tablet (500 mg total) by mouth 4 (four) times daily. 07/05/21  Yes Meyran, Ocie Cornfield, NP  ?metoprolol succinate (TOPROL-XL) 50 MG 24 hr tablet TAKE 1 TABLET BY MOUTH ONCE DAILY IMMEDIATELY FOLLOWING A MEAL ?Patient taking differently: Take 50 mg by mouth daily. 06/15/21  Yes Troy Sine, MD  ?Multiple Vitamins-Minerals (CENTRUM SILVER ADULT 50+) TABS Take 1 tablet by mouth daily.   Yes [provider]  ?MYRBETRIQ 50 MG TB24 tablet Take 50 mg by mouth daily. 04/12/21  Yes [provider]  ?nitroGLYCERIN (NITROSTAT) 0.4 MG SL tablet Place 1 tablet (0.4 mg total) under the tongue every 5 (five) minutes as needed for chest pain. 04/05/21 07/09/21 Yes Troy Sine, MD  ?olmesartan (BENICAR) 20 MG tablet Take 1 tablet (20 mg total) by mouth daily. 03/13/21  Yes Troy Sine, MD  ?rosuvastatin (CRESTOR) 5 MG tablet Take 5 mg by mouth daily. 03/29/21  Yes [provider]  ?amLODipine-atorvastatin (CADUET) 10-80 MG tablet Take 1 tablet by mouth in the evening ?Patient not taking: Reported on 07/09/2021 03/12/21   Troy Sine, MD  ?gabapentin (NEURONTIN) 300 MG capsule Take 300 mg by mouth in the morning, at noon, and at bedtime. ?Patient not taking: Reported on 07/09/2021 05/06/21   [provider]  ? ? ?Inpatient Medications: ?Scheduled Meds: ? aspirin EC  81 mg Oral Daily  ? enoxaparin (LOVENOX) injection  40 mg Subcutaneous Daily  ? ezetimibe  10 mg Oral Daily  ? furosemide  20 mg Intravenous BID  ? insulin aspart  0-15  Units Subcutaneous TID AC & HS  ? irbesartan  150 mg Oral Daily  ? metoprolol succinate  50 mg Oral Daily  ? mirabegron ER  50 mg Oral Daily  ? rosuvastatin  5 mg Oral Daily  ? ?Continuous Infusions: ? ?PRN Meds: ?acetaminophen **OR** acetaminophen, hydrALAZINE, methocarbamol, nitroGLYCERIN, ondansetron **OR** ondansetron (ZOFRAN) IV, oxyCODONE-acetaminophen, polyethylene glycol ? ?Allergies:   No Known Allergies ? ?Social History:   ?Social History  ? ?Socioeconomic History  ? Marital status: Married  ?  Spouse name: Gwyndolyn Saxon  ? Number of children: 1  ? Years of education: 34  ? Highest education level: Not on file  ?Occupational History  ? Not on file  ?Tobacco Use  ? Smoking status: Some Days  ?  Years: 60.00  ?  Types: Cigarettes  ? Smokeless tobacco: Never  ? Tobacco comments:  ?  2-3 cigarettes per day  ?Vaping Use  ? Vaping Use: Never used  ?Substance and Sexual Activity  ? Alcohol use: Not Currently  ? Drug use: No  ? Sexual activity: Not on file  ?Other Topics Concern  ? Not on file  ?Social History Narrative  ? Right handed  ? Lives with husband  ? Caffeine use: Tea twice per week  ? ?Social Determinants of Health  ? ?Financial Resource Strain: Not on file  ?Food Insecurity: Not on file  ?Transportation Needs: Not on file  ?Physical Activity: Not on file  ?Stress: Not on file  ?Social Connections: Not on file  ?Intimate Partner Violence: Not on file  ?  ?Family History:   ? ?Family History  ?Problem Relation Age of Onset  ? Diabetes type II Sister   ? Heart disease Sister   ? Colon cancer Neg Hx   ? Colon polyps Neg Hx   ? Esophageal cancer Neg Hx   ? Rectal cancer Neg Hx   ? Stomach cancer Neg Hx   ?  ? ?ROS:  ?Please see the history of present illness.  ?General:no colds or fevers, no weight changes ?Skin:no rashes or ulcers ?HEENT:no blurred vision, no congestion ?CV:see HPI ?PUL:see HPI ?GI:no diarrhea constipation or melena, no indigestion ?GU:no hematuria, no dysuria ?MS:no joint pain, no  claudication, back still with pain  ?Neuro:no syncope, no lightheadedness ?Endo:+ diabetes, no thyroid disease ? ?All other ROS reviewed and negative.    ? ?Physical Exam/Data:  ? ?Vitals:  ? 07/09/21 0746 07/09/21 0

## 2021-07-09 NOTE — TOC Initial Note (Signed)
Transition of Care (TOC) - Initial/Assessment Note  ? ? ?Patient Details  ?Name: Debbie [name] ?MRN: [id number] ?Date of Birth: May 18, 1945 ? ?Transition of Care (TOC) CM/SW Contact:    ?Zenon Mayo, RN ?Phone Number: ?07/09/2021, 4:09 PM ? ?Clinical Narrative:                 ?from home with spouse, chest pain, ASA/ Nitro, Cards to see, conts on IV lasix, has had recent back surgery.  NCM offered choice for HHPT, patient chose [facility] as first choice and [facility] as second choice.  NCM made referral to [facility] with [facility] , awaiting call back. Patient uses the  [employer] on [address] will provide transport home at discharge.  Patient uses a walker at home, she has a 3 n 1 and she has grab bars on her bath tub.  TOC will continue to follow for dc needs. Per Corene Cornea they can not take referral.  NCM made referral to patient's second choice, [facility] with [facility], he is able to take referral.  Soc will begin 24 to 48 hrs post dc.  ? ?Expected Discharge Plan: [place] ?Barriers to Discharge: Continued Medical Work up ? ? ?Patient Goals and CMS Choice ?Patient states their goals for this hospitalization and ongoing recovery are:: return home ?CMS Medicare.gov Compare Post Acute Care list provided to:: Patient ?Choice offered to / list presented to : Patient ? ?Expected Discharge Plan and Services ?Expected Discharge Plan: [place] ?  ?Discharge Planning Services: CM Consult ?  ?Living arrangements for the past 2 months: [place] ?                ?  ?DME Agency: NA ?  ?  ?  ?HH Arranged: PT, OT ?[place] Agency: [place] ?Date HH Agency Contacted: 07/09/21 ?Time [place]: [id number] ?Representative spoke with at [place]: Tommi Rumps ? ?Prior Living Arrangements/Services ?Living arrangements for the past 2 months: [place] ?Lives with:: Spouse ?Patient language and need for interpreter reviewed:: Yes ?Do you feel safe going  back to the place where you live?: Yes      ?Need for Family Participation in Patient Care: Yes (Comment) ?Care giver support system in place?: Yes (comment) ?Current home services: DME, Home RN (walker, 3 n 1, grab bars) ?Criminal Activity/Legal Involvement Pertinent to Current Situation/Hospitalization: No - Comment as needed ? ?Activities of Daily Living ?Home Assistive Devices/Equipment: Bedside commode/3-in-1, Walker (specify type) ?ADL Screening (condition at time of admission) ?Patient's cognitive ability adequate to safely complete daily activities?: Yes ?Is the patient deaf or have difficulty hearing?: No ?Does the patient have difficulty seeing, even when wearing glasses/contacts?: No ?Does the patient have difficulty concentrating, remembering, or making decisions?: No ?Patient able to express need for assistance with ADLs?: Yes ?Does the patient have difficulty dressing or bathing?: Yes ?Independently performs ADLs?: Yes (appropriate for developmental age) ?Does the patient have difficulty walking or climbing stairs?: Yes ?Weakness of Legs: Both ?Weakness of Arms/Hands: None ? ?Permission Sought/Granted ?  ?  ?   ?   ?   ?   ? ?Emotional Assessment ?Appearance:: Appears stated age ?Attitude/Demeanor/Rapport: Engaged ?Affect (typically observed): Appropriate ?Orientation: : Oriented to Self, Oriented to Place, Oriented to  Time, Oriented to Situation ?Alcohol / Substance Use: Not Applicable ?Psych Involvement: No (comment) ? ?Admission diagnosis:  Acute congestive heart failure ([place]) [I50.9] ?Acute on chronic systolic congestive  heart failure ([place]) [I50.23] ?Patient Active Problem List  ? Diagnosis Date Noted  ? Acute congestive heart failure ([place]) 07/09/2021  ? Mixed diabetic hyperlipidemia associated with type 2 diabetes mellitus ([place]) 07/09/2021  ? Chest pain 07/09/2021  ? S/P lumbar fusion 07/04/2021  ? Ovarian mass 03/29/2020  ? S/P hysterectomy 03/29/2020  ? Bilateral occipital neuralgia 11/06/2017   ? Coronary artery disease involving native coronary artery of native heart 03/02/2016  ? Chest pain with moderate risk for cardiac etiology 01/10/2016  ? Type 2 diabetes mellitus without complication, without long-term current use of insulin ([place]) 12/29/2012  ? CAD S/P percutaneous coronary angioplasty 09/22/2012  ? History of CVA, 1998  09/22/2012  ? Bradycardia Aug 2013 12/14/2011  ? PAF (paroxysmal atrial fibrillation) ([place]) 12/14/2011  ? Essential hypertension 12/14/2011  ? Hyperlipidemia 12/14/2011  ? ?PCP:  Sandi Mariscal, MD ?Pharmacy:   ?[facility], [address] ?[place] [address] ?Phone: [phone] Fax: [phone] ? ? ? ? ?Social Determinants of Health (SDOH) Interventions ?  ? ?Readmission Risk Interventions ?   ? View : No data to display.  ?  ?  ?  ? ? ? ?

## 2021-07-09 NOTE — Progress Notes (Signed)
?PROGRESS NOTE ? ? ? ?Debbie [name]  GEX:[id number] DOB: [birth year] DOA: 07/09/2021 ?PCP: Sandi Mariscal, MD  ? ?Chief Complaint  ?Patient presents with  ? Chest Pain x 3 hours  ? ? ?Brief Narrative:  ? ? ?She is a no charge note as patient was seen and admitted earlier today by Dr. Cyd Silence, patient was seen and examined, chart, imaging and labs were reviewed. ? ?76 year old female with past medical history of remote stroke (1998), coronary artery disease (3 stents to RCA 2001, last cath 2006 showed stents patent), non insulin dependent diabetes mellitus type 2, hyperlipidemia, hypertension, paroxysmal atrial fibrillation (Dx 2013, not on anticoagulation) who presents to [facility] emergency department with complaints of chest discomfort and shortness of breath.  She was admitted for evaluation for acute heart failure and chest pain. ?  ?Assessment & Plan: ?  ?Principal Problem: ?  Acute congestive heart failure ([place]) ?Active Problems: ?  Chest pain ?  Coronary artery disease involving native coronary artery of native heart ?  Type 2 diabetes mellitus without complication, without long-term current use of insulin ([place]) ?  PAF (paroxysmal atrial fibrillation) ([place]) ?  Essential hypertension ?  Mixed diabetic hyperlipidemia associated with type 2 diabetes mellitus ([place]) ?  S/P lumbar fusion ? ?Acute congestive heart failure ([place]) ?Patient has a longstanding history of heart disease extending over 20 years but has never formally been diagnosed with congestive heart failure ?Patient has not had an ischemic assessment or echocardiogram performed in several years ?Patient just underwent a prolonged neurosurgical procedure several days ago during which she undoubtedly received a good deal of intravenous fluids ?Patient has been unable to exert herself in the past few days due to substantial back pain to report exertional dyspnea however the episode of severe shortness of breath and chest discomfort that prompted her  arrival of EMS is very likely an episode of paroxysmal nocturnal dyspnea due to cardiogenic volume overload ?Patient additionally exhibits markedly elevated BNP and evidence of pulmonary edema on chest imaging ?Physical exam also reveals bibasilar rales and pitting lower extremity edema edema ?Continue with low-dose diuresis 20 mg IV twice daily, follow on 2D echo, cardiology has been consulted. ? ?  ?Chest pain ?Patient presenting with intense pressure-like chest discomfort  ?Neurology has been consulted to evaluate for possible need for furthering testing. ?He is chest pain-free currently. ? ?  ?  ?Coronary artery disease involving native coronary artery of native heart ?Monitoring patient on telemetry ?Continue home regimen of antiplatelet therapy, lipid lowering therapy and AV nodal blocking therapy ?Remainder of assessment and plan as above ?  ?  ?PAF (paroxysmal atrial fibrillation) ([place]) ?Remote history, records refer to this Dx as early as 2013 ?Not on anticoagulation in the outpatient setting ?Currently in normal sinus rhythm and rate controlled ?Continue metoprolol ?  ?  ?Essential hypertension ?Resume patients home regimen of oral antihypertensives ?Titrate antihypertensive regimen as necessary to achieve adequate BP control ?PRN intravenous antihypertensives for excessively elevated blood pressure ?  ?  ?  ?Mixed diabetic hyperlipidemia associated with type 2 diabetes mellitus ([place]) ?Continuing home regimen of lipid lowering therapy. ?Obtaining lipid panel ?  ?  ?S/P lumbar fusion ?Patient is status post recent neurosurgical procedure on 3/22 and is still experiencing substantial low back pain as a result ?We will continue previously prescribed regimen of as needed Percocet for pain as well as as needed muscle relaxant ?We will additionally obtain PT evaluation ?Neurosurgery evaluation during hospital stay greatly appreciated. ?  ?  ?  ?  ? ? ? ?  DVT prophylaxis: Lovenox ?Code Status: Full ?Family  Communication: husband at bedside ?Disposition:  ? ?Status is: Inpatient ?Remains inpatient appropriate because: on IV  ?  ?Consultants:  ?Cardiology ?Neurosurgery ? ? ?Subjective: ? ?Currently denies any chest pain, denies any lower back pain as well, she reports some dyspnea with activity. ? ?Objective: ?Vitals:  ? 07/09/21 0400 07/09/21 0420 07/09/21 0746 07/09/21 0815  ?BP: (!) 150/69 136/72  (!) 168/79  ?Pulse: 66 67  73  ?Resp: 19 (!) 23  18  ?Temp:   98.3 ?F (36.8 ?C) 98.8 ?F (37.1 ?C)  ?TempSrc:   Oral Oral  ?SpO2: 97% 98%  97%  ?Weight:    89.6 kg  ?Height:    5' 0.5" (1.537 m)  ? ? ?Intake/Output Summary (Last 24 hours) at 07/09/2021 1115 ?Last data filed at 07/09/2021 3845 ?Gross per 24 hour  ?Intake --  ?Output 250 ml  ?Net -250 ml  ? ?Filed Weights  ? 07/09/21 0815  ?Weight: 89.6 kg  ? ? ?Examination: ? ?General exam: Appears calm and comfortable  ?Respiratory system: Clear to auscultation. Respiratory effort normal. ?Cardiovascular system: S1 & S2 heard, RRR. No JVD, murmurs, rubs, gallops or clicks. No pedal edema. ?Gastrointestinal system: Abdomen is nondistended, soft and nontender. No organomegaly or masses felt. Normal bowel sounds heard. ?Central nervous system: Alert and oriented. No focal neurological deficits. ?Extremities: Symmetric 5 x 5 power. ?Skin: No rashes, lesions or ulcers ?Psychiatry: Judgement and insight appear normal. Mood & affect appropriate.  ? ? ? ?Data Reviewed: I have personally reviewed following labs and imaging studies ? ?CBC: ?Recent Labs  ?Lab 07/09/21 ?0157  ?WBC 12.7*  ?HGB 8.6*  ?HCT 25.9*  ?MCV 90.9  ?PLT 267  ? ? ?Basic Metabolic Panel: ?Recent Labs  ?Lab 07/09/21 ?0157  ?NA 138  ?K 4.0  ?CL 106  ?CO2 23  ?GLUCOSE 159*  ?BUN 8  ?CREATININE 0.91  ?CALCIUM 8.7*  ? ? ?GFR: ?Estimated Creatinine Clearance: 52.2 mL/min (by C-G formula based on SCr of 0.91 mg/dL). ? ?Liver Function Tests: ?No results for input(s): AST, ALT, ALKPHOS, BILITOT, PROT, ALBUMIN in the last 168  hours. ? ?CBG: ?Recent Labs  ?Lab 07/04/21 ?1527 07/04/21 ?1801 07/04/21 ?2104 07/05/21 ?3646 07/09/21 ?0421  ?GLUCAP 160* 193* 174* 160* 147*  ? ? ? ?Recent Results (from the past 240 hour(s))  ?Surgical PCR Screen     Status: None  ? Collection Time: 07/02/21  9:01 AM  ? Specimen: Nasal Mucosa; Nasal Swab  ?Result Value Ref Range Status  ? MRSA, PCR NEGATIVE NEGATIVE Final  ? Staphylococcus aureus NEGATIVE NEGATIVE Final  ?  Comment: (NOTE) ?The Xpert SA Assay (FDA approved for NASAL specimens in patients 21 ?years of age and older), is one component of a comprehensive ?surveillance program. It is not intended to diagnose infection nor to ?guide or monitor treatment. ?Performed at [facility], [place] [address] [place], [place] ?[address] ?  ?  ? ? ? ? ? ?Radiology Studies: ?CT Angio Chest PE W and/or Wo Contrast ? ?Result Date: 07/09/2021 ?CLINICAL DATA:  Chest pain earlier today, since resolved. EXAM: CT ANGIOGRAPHY CHEST CT ABDOMEN AND PELVIS WITH CONTRAST TECHNIQUE: Multidetector CT imaging of the chest was performed using the standard protocol during bolus administration of intravenous contrast. Multiplanar CT image reconstructions and MIPs were obtained to evaluate the vascular anatomy. Multidetector CT imaging of the abdomen and pelvis was performed using the standard protocol during bolus administration of intravenous contrast. RADIATION DOSE  REDUCTION: This exam was performed according to the departmental dose-optimization program which includes automated exposure control, adjustment of the mA and/or kV according to patient size and/or use of iterative reconstruction technique. CONTRAST:  53m OMNIPAQUE IOHEXOL 350 MG/ML SOLN COMPARISON:  CT abdomen and pelvis with no contrast 06/29/2019. FINDINGS: CTA CHEST FINDINGS Cardiovascular: The pulmonary arteries are upper limits of normal in caliber with no evidence of arterial emboli. No evidence of acute right heart strain. There is mild cardiomegaly with  left chamber predominance. There are scattered three-vessel calcifications in the coronary arteries. There are no pulmonary venous dilatation. There is moderate to heavy aortic atherosclerosis, scattered calcifi

## 2021-07-09 NOTE — Evaluation (Signed)
Physical Therapy Evaluation ?Patient Details ?Name: Debbie [name] ?MRN: [id number] ?DOB: November 18, 1945 ?Today's Date: 07/09/2021 ? ?History of Present Illness ? 76 year old female admitted 3/27 who presents to [facility] emergency department with complaints of chest discomfort and shortness of breath.  She was admitted for evaluation for acute heart failure and chest pain. Pt just d/c'd home on 3/23 after L3-L5 fusion.  Neuro reviewed CT that showed superior endplate fracture of L5 with subsidence of the grafts into the vertebral body by a about 10 mm with no hardware loosening and neuro ok'd PT/OT.   PMH:  remote stroke (1998), coronary artery disease (3 stents to RCA 2001, last cath 2006 showed stents patent), non insulin dependent diabetes mellitus type 2, hyperlipidemia, hypertension, paroxysmal atrial fibrillation (Dx 2013, not on anticoagulation)  ?Clinical Impression ? Pt admitted with above diagnosis. Pt was able to walk about the bed with RW with min assist.   Pt limited by pain and endurance. Used pillows to place pt in upright position in chair so she could eat lunch.  Husband went home to get pts brace and pt and nurse educated for husband or staff to assist pt with brace when husband brought it back and to wear when OOB.  Pt and nurse agrees.  Pt currently with functional limitations due to the deficits listed below (see PT Problem List). Pt will benefit from skilled PT to increase their independence and safety with mobility to allow discharge to the venue listed below.      ?   ? ?Recommendations for follow up therapy are one component of a multi-disciplinary discharge planning process, led by the attending physician.  Recommendations may be updated based on patient status, additional functional criteria and insurance authorization. ? ?Follow Up Recommendations Home health PT ? ?  ?Assistance Recommended at Discharge Set up Supervision/Assistance  ?Patient can return home with the following ? A  little help with walking and/or transfers;A little help with bathing/dressing/bathroom;Help with stairs or ramp for entrance ? ?  ?Equipment Recommendations None recommended by PT  ?Recommendations for Other Services ?    ?  ?Functional Status Assessment Patient has had a recent decline in their functional status and demonstrates the ability to make significant improvements in function in a reasonable and predictable amount of time.  ? ?  ?Precautions / Restrictions Precautions ?Precaution Booklet Issued: Yes (comment) ?Precaution Comments: Reviewed precautions and pt verbalized them all ?Required Braces or Orthoses: Spinal Brace ?Spinal Brace: Lumbar corset;Applied in sitting position (Husband went home to get brace) ?Restrictions ?Weight Bearing Restrictions: No  ? ?  ? ?Mobility ? Bed Mobility ?Overal bed mobility: Needs Assistance ?Bed Mobility: Rolling, Sidelying to Sit ?Rolling: Mod assist ?Sidelying to sit: Mod assist ?  ?  ?  ?General bed mobility comments: Needed cues for log roll and mod assist to sit up. ?  ? ?Transfers ?Overall transfer level: Needs assistance ?Equipment used: Rolling walker (2 wheels) ?Transfers: Sit to/from Stand ?Sit to Stand: Mod assist ?  ?Step pivot transfers: Min assist ?  ?  ?  ?General transfer comment: Pt needed mod assist to power up.  Took incr time to come to standing due to pain.  Pt then required min assist with RW for pivot. Pivoted to the 3N1.  Had to urinate. ?  ? ?Ambulation/Gait ?Ambulation/Gait assistance: Min assist ?Gait Distance (Feet): 25 Feet ?Assistive device: Rolling walker (2 wheels) ?Gait Pattern/deviations: Step-through pattern, Decreased stride length, Trunk flexed, Drifts right/left, Antalgic ?  ?Gait velocity  interpretation: <1.31 ft/sec, indicative of household ambulator ?  ?General Gait Details: VC's for improved posture, closer walker proximity, and forward gaze. Min guard assist and mod cues with RW for suport. ? ?Stairs ?  ?  ?  ?  ?  ? ?Wheelchair  Mobility ?  ? ?Modified Rankin (Stroke Patients Only) ?  ? ?  ? ?Balance Overall balance assessment: Needs assistance ?Sitting-balance support: Feet supported, No upper extremity supported ?Sitting balance-Leahy Scale: Fair ?  ?  ?Standing balance support: No upper extremity supported, During functional activity, Bilateral upper extremity supported, Reliant on assistive device for balance ?Standing balance-Leahy Scale: Poor ?Standing balance comment: could not stand without UE support and without RW. ?  ?  ?  ?  ?  ?  ?  ?  ?  ?  ?  ?   ? ? ? ?Pertinent Vitals/Pain Pain Assessment ?Pain Assessment: Faces ?Faces Pain Scale: Hurts even more ?Pain Location: Incision ?Pain Descriptors / Indicators: Aching, Grimacing, Guarding ?Pain Intervention(s): Limited activity within patient's tolerance, Monitored during session, Repositioned  ? ? ?Home Living Family/patient expects to be discharged to:: Private residence ?Living Arrangements: Spouse/significant other ?Available Help at Discharge: Family;Available 24 hours/day (husband retired, he does go to gym, son close by) ?Type of Home: House ?Home Access: Stairs to enter ?Entrance Stairs-Rails: None ?Entrance Stairs-Number of Steps: 1-2 ?  ?Home Layout: One level ?Home Equipment: Rollator (4 wheels);Rolling Walker (2 wheels);Cane - single point;BSC/3in1;Shower seat;Grab bars - tub/shower ?   ?  ?Prior Function   ?  ?  ?  ?  ?  ?  ?Mobility Comments: Modif I walking in house with rollator ?ADLs Comments: I with B/D before surgery, hasnt had shower since she went home on 3/23.  Did sponge bathe once at home and pt states she did it on her own. ?  ? ? ?Hand Dominance  ? Dominant Hand: Right ? ?  ?Extremity/Trunk Assessment  ? Upper Extremity Assessment ?Upper Extremity Assessment: Defer to OT evaluation ?  ? ?Lower Extremity Assessment ?Lower Extremity Assessment: Overall WFL for tasks assessed ?  ? ?Cervical / Trunk Assessment ?Cervical / Trunk Assessment: Back Surgery   ?Communication  ? Communication: No difficulties  ?Cognition Arousal/Alertness: Awake/alert ?Behavior During Therapy: [facility] for tasks assessed/performed ?Overall Cognitive Status: Within Functional Limits for tasks assessed ?  ?  ?  ?  ?  ?  ?  ?  ?  ?  ?  ?  ?  ?  ?  ?  ?  ?  ?  ? ?  ?General Comments General comments (skin integrity, edema, etc.): VSS ? ?  ?Exercises    ? ?Assessment/Plan  ?  ?PT Assessment Patient needs continued PT services  ?PT Problem List Decreased strength;Decreased activity tolerance;Decreased balance;Decreased mobility;Decreased knowledge of use of DME;Decreased safety awareness;Cardiopulmonary status limiting activity;Decreased knowledge of precautions;Impaired sensation;Pain ? ?   ?  ?PT Treatment Interventions DME instruction;Stair training;Gait training;Functional mobility training;Therapeutic activities;Therapeutic exercise;Balance training;Patient/family education   ? ?PT Goals (Current goals can be found in the Care Plan section)  ?Acute Rehab PT Goals ?Patient Stated Goal: pain relief ?PT Goal Formulation: With patient ?Time For Goal Achievement: 07/23/21 ?Potential to Achieve Goals: Good ? ?  ?Frequency Min 5X/week ?  ? ? ?Co-evaluation   ?  ?  ?  ?  ? ? ?  ?AM-PAC PT "6 Clicks" Mobility  ?Outcome Measure Help needed turning from your back to your side while in a flat bed without using  bedrails?: A Lot ?Help needed moving from lying on your back to sitting on the side of a flat bed without using bedrails?: A Lot ?Help needed moving to and from a bed to a chair (including a wheelchair)?: A Little ?Help needed standing up from a chair using your arms (e.g., wheelchair or bedside chair)?: A Lot ?Help needed to walk in hospital room?: A Little ?Help needed climbing 3-5 steps with a railing? : A Lot ?6 Click Score: 14 ? ?  ?End of Session Equipment Utilized During Treatment: Gait belt;Back brace (husband went to get brace) ?Activity Tolerance: Patient tolerated treatment well ?Patient  left: in chair;with call bell/phone within reach;with chair alarm set ?Nurse Communication: Mobility status ?PT Visit Diagnosis: Unsteadiness on feet (R26.81);Pain ?Pain - part of body:  (back) ?  ? ?Time: 1217-1

## 2021-07-09 NOTE — Assessment & Plan Note (Signed)
?   Patient is status post recent neurosurgical procedure on 3/22 and is still experiencing substantial low back pain as a result ?? We will continue previously prescribed regimen of as needed Percocet for pain as well as as needed muscle relaxant ?? We will additionally obtain PT evaluation ?

## 2021-07-09 NOTE — H&P (Signed)
?History and Physical  ? ? ?Patient: Debbie [name] MRN: [id number] DOA: 07/09/2021 ? ?Date of Service: the patient was seen and examined on 07/09/2021 ? ?Patient coming from: Home via EMS ? ?Chief Complaint:  ?Chief Complaint  ?Patient presents with  ? Chest Pain x 3 hours  ? ? ?HPI:  ? ?76 year old female with past medical history of remote stroke (1998), coronary artery disease (3 stents to RCA 2001, last cath 2006 showed stents patent), non insulin dependent diabetes mellitus type 2, hyperlipidemia, hypertension, paroxysmal atrial fibrillation (Dx 2013, not on anticoagulation) who presents to [facility] emergency department with complaints of chest discomfort and shortness of breath. ? ?Of note, patient was recently hospitalized at [facility] from 3/22 until 3/23 on the neurosurgical service.  On 3/22 patient underwent decompressive lumbar laminectomy, Hemi facetectomy and foraminotomy of L3-L4 and L4-L5 along with posterior lumbar interbody fusion of L3-L4 and L4-L5 as well as posterior fixation of L3-L5.  According to neurosurgical notes, there were no immediate complications and patient was discharged home on 3/23. ? ?Patient explains that she has not been experiencing any significant shortness of breath since the lumbar fusion but explains that she has been unable to exert herself much due to associated low back pain.  Patient is unable to comment on whether she has developed lower extremity edema or weight gain as of late as she has been preoccupied with her postsurgical recovery. ? ?Patient states that she went to sleep yesterday evening like usual but early this morning woke up with severe shortness of breath.  This severe shortness of breath associated with chest discomfort.  Patient describes the chest discomfort is midsternal chest pressure.  Patient denies any associated fever cough.  Patient denies any sick contacts. ? ?Husband immediately contacted EMS and was instructed to give the patient  nitroglycerin and 4 baby aspirin.  Within minutes of administering these medications patient's chest discomfort improved somewhat.  EMS arrived and promptly brought the patient into [facility] emergency department for evaluation. ? ?Upon evaluation in the emergency department patient initially was found to have a markedly elevated D-dimer and underwent CT angiogram of the chest which revealed no evidence of pulmonary embolism or pneumonia.  BNP was found to be markedly elevated and chest imaging did reveal evidence of pulmonary edema.  ER provider did discuss case with overnight cardiology fellow Dr. Alfred Levins who recommended medicine admission for further management.  20 mg of intravenous Lasix was administered.  The hospitalist group was then called to assess the patient for admission to the hospital. ? ?Review of Systems: Review of Systems  ?Respiratory:  Positive for shortness of breath.   ?Cardiovascular:  Positive for chest pain and orthopnea.  ?Musculoskeletal:  Positive for back pain.  ?All other systems reviewed and are negative. ? ? ?Past Medical History:  ?Diagnosis Date  ? Arthritis   ? CAD (coronary artery disease) 11/1999  ? RCA stent X3, patent '06. Nuc low risk 9/13  ? Cataract   ? beginning stages  ? Diabetes mellitus without complication ([place])   ? Dysrhythmia   ? History of stress test 01/08/2012  ? Showed minimal anterior thinning not felt to be significant.  ? Hx of echocardiogram 12/12/2011  ? EF>55%  ? Hypercholesteremia   ? Hypertension   ? Myocardial infarction [facility])   ? 28 years ago    age 46  ? PAF (paroxysmal atrial fibrillation) ([place]) 11/2011  ? SSS component with some bradycardia  ? Stroke (  [place]) 1998  ? Vitamin D deficiency   ? ? ?Past Surgical History:  ?Procedure Laterality Date  ? ABDOMINAL HYSTERECTOMY    ? CATARACT EXTRACTION, BILATERAL    ? CHOLECYSTECTOMY    ? COLONOSCOPY    ? CORONARY ANGIOGRAM  08/2004  ? patent stents  ? CORONARY ANGIOPLASTY WITH STENT PLACEMENT  11/1999  ? X  3  ? EYE SURGERY    ? MULTIPLE TOOTH EXTRACTIONS    ? ROBOTIC ASSISTED TOTAL HYSTERECTOMY WITH BILATERAL SALPINGO OOPHERECTOMY Bilateral 03/29/2020  ? Procedure: XI ROBOTIC ASSISTED TOTAL HYSTERECTOMY WITH BILATERAL SALPINGO OOPHORECTOMY;  Surgeon: Christophe Louis, MD;  Location: [place];  Service: Gynecology;  Laterality: Bilateral;  Tracie to RNFA confirmed on 02/16/20 CS  ? ? ?Social History:  reports that she has been smoking cigarettes. She has never used smokeless tobacco. She reports that she does not currently use alcohol. She reports that she does not use drugs. ? ?No Known Allergies ? ?Family History  ?Problem Relation Age of Onset  ? Diabetes type II Sister   ? Heart disease Sister   ? Colon cancer Neg Hx   ? Colon polyps Neg Hx   ? Esophageal cancer Neg Hx   ? Rectal cancer Neg Hx   ? Stomach cancer Neg Hx   ? ? ?Prior to Admission medications   ?Medication Sig Start Date End Date Taking? Authorizing Provider  ?acetaminophen (TYLENOL) 500 MG tablet Take 2 tablets (1,000 mg total) by mouth every 8 (eight) hours as needed. 03/30/20  Yes Christophe Louis, MD  ?aspirin EC 81 MG tablet Take 1 tablet (81 mg total) by mouth daily. 03/01/16  Yes Troy Sine, MD  ?ezetimibe (ZETIA) 10 MG tablet Take 1 tablet by mouth once daily 06/15/21  Yes Troy Sine, MD  ?furosemide (LASIX) 20 MG tablet Take 1 tablet by mouth once daily 06/15/21  Yes Troy Sine, MD  ?HYDROcodone-acetaminophen (NORCO/VICODIN) 5-325 MG tablet Take 1 tablet by mouth every 4 (four) hours as needed for moderate pain. 07/05/21 07/05/22 Yes Meyran, Ocie Cornfield, NP  ?metFORMIN (GLUCOPHAGE) 500 MG tablet Take 500 mg by mouth 2 (two) times daily. 03/23/21  Yes [provider]  ?methocarbamol (ROBAXIN) 500 MG tablet Take 1 tablet (500 mg total) by mouth 4 (four) times daily. 07/05/21  Yes Meyran, Ocie Cornfield, NP  ?metoprolol succinate (TOPROL-XL) 50 MG 24 hr tablet TAKE 1 TABLET BY MOUTH ONCE DAILY IMMEDIATELY FOLLOWING A MEAL ?Patient taking  differently: Take 50 mg by mouth daily. 06/15/21  Yes Troy Sine, MD  ?Multiple Vitamins-Minerals (CENTRUM SILVER ADULT 50+) TABS Take 1 tablet by mouth daily.   Yes [provider]  ?MYRBETRIQ 50 MG TB24 tablet Take 50 mg by mouth daily. 04/12/21  Yes [provider]  ?nitroGLYCERIN (NITROSTAT) 0.4 MG SL tablet Place 1 tablet (0.4 mg total) under the tongue every 5 (five) minutes as needed for chest pain. 04/05/21 07/09/21 Yes Troy Sine, MD  ?olmesartan (BENICAR) 20 MG tablet Take 1 tablet (20 mg total) by mouth daily. 03/13/21  Yes Troy Sine, MD  ?rosuvastatin (CRESTOR) 5 MG tablet Take 5 mg by mouth daily. 03/29/21  Yes [provider]  ?amLODipine-atorvastatin (CADUET) 10-80 MG tablet Take 1 tablet by mouth in the evening ?Patient not taking: Reported on 07/09/2021 03/12/21   Troy Sine, MD  ?gabapentin (NEURONTIN) 300 MG capsule Take 300 mg by mouth in the morning, at noon, and at bedtime. ?Patient not taking: Reported on 07/09/2021  05/06/21   [provider]  ? ? ?Physical Exam: ? ?Vitals:  ? 07/09/21 0245 07/09/21 0315 07/09/21 0400 07/09/21 0420  ?BP: 125/62 (!) 159/84 (!) 150/69 136/72  ?Pulse: 73 85 66 67  ?Resp: (!) 24 (!) 29 19 (!) 23  ?SpO2: 97% 90% 97% 98%  ? ? ?Constitutional: Awake alert and oriented x3, no associated distress.   ?Skin: no rashes, no lesions, good skin turgor noted. ?Eyes: Pupils are equally reactive to light.  No evidence of scleral icterus or conjunctival pallor.  ?ENMT: Moist mucous membranes noted.  Posterior pharynx clear of any exudate or lesions.   ?Neck: normal, supple, no masses, no thyromegaly.  No evidence of jugular venous distension.   ?Respiratory: clear to auscultation bilaterally, no wheezing, no crackles. Normal respiratory effort. No accessory muscle use.  ?Cardiovascular: Regular rate and rhythm, no murmurs / rubs / gallops. No extremity edema. 2+ pedal pulses. No carotid bruits.  ?Chest:   Nontender without  crepitus or deformity.   ?Back:   Nontender without crepitus or deformity. ?Abdomen: Abdomen is soft and nontender.  No evidence of intra-abdominal masses.  Positive bowel sounds noted in all quadrants.

## 2021-07-09 NOTE — Assessment & Plan Note (Signed)
?   Monitoring patient on telemetry ?? Continue home regimen of antiplatelet therapy, lipid lowering therapy and AV nodal blocking therapy ?? Remainder of assessment and plan as above ? ?

## 2021-07-09 NOTE — ED Provider Notes (Signed)
?[facility] ?Provider Note ? ? ?CSN: [id number] ?Arrival date & time: 07/09/21  0131 ? ?  ? ?History ? ?Chief Complaint  ?Patient presents with  ? Chest Pain x 3 hours  ? ? ?Debbie [name] is a 76 y.o. female. ? ?76 yo F with a chief complaints of chest pain.  Right-sided occurred while she was having a bowel movement.  Nothing seem to make it better or worse.  Lasted for about an hour.  They called EMS and had gotten significantly better and now resolved.  She had some difficulty breathing at the onset of it.  Denied diaphoresis no nausea or vomiting.  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  This is all been fairly remote.  She also recently had a low back procedure and has been having some discomfort from that.  No fevers or chills.  No trauma to the chest.  No cough or congestion. ? ? ? ?  ? ?Home Medications ?Prior to Admission medications   ?Medication Sig Start Date End Date Taking? Authorizing Provider  ?acetaminophen (TYLENOL) 500 MG tablet Take 2 tablets (1,000 mg total) by mouth every 8 (eight) hours as needed. 03/30/20  Yes Christophe Louis, MD  ?aspirin EC 81 MG tablet Take 1 tablet (81 mg total) by mouth daily. 03/01/16  Yes Troy Sine, MD  ?ezetimibe (ZETIA) 10 MG tablet Take 1 tablet by mouth once daily 06/15/21  Yes Troy Sine, MD  ?furosemide (LASIX) 20 MG tablet Take 1 tablet by mouth once daily 06/15/21  Yes Troy Sine, MD  ?HYDROcodone-acetaminophen (NORCO/VICODIN) 5-325 MG tablet Take 1 tablet by mouth every 4 (four) hours as needed for moderate pain. 07/05/21 07/05/22 Yes Meyran, Ocie Cornfield, NP  ?metFORMIN (GLUCOPHAGE) 500 MG tablet Take 500 mg by mouth 2 (two) times daily. 03/23/21  Yes [provider]  ?methocarbamol (ROBAXIN) 500 MG tablet Take 1 tablet (500 mg total) by mouth 4 (four) times daily. 07/05/21  Yes Meyran, Ocie Cornfield, NP  ?metoprolol succinate (TOPROL-XL) 50 MG 24 hr  tablet TAKE 1 TABLET BY MOUTH ONCE DAILY IMMEDIATELY FOLLOWING A MEAL ?Patient taking differently: Take 50 mg by mouth daily. 06/15/21  Yes Troy Sine, MD  ?Multiple Vitamins-Minerals (CENTRUM SILVER ADULT 50+) TABS Take 1 tablet by mouth daily.   Yes [provider]  ?MYRBETRIQ 50 MG TB24 tablet Take 50 mg by mouth daily. 04/12/21  Yes [provider]  ?nitroGLYCERIN (NITROSTAT) 0.4 MG SL tablet Place 1 tablet (0.4 mg total) under the tongue every 5 (five) minutes as needed for chest pain. 04/05/21 07/09/21 Yes Troy Sine, MD  ?olmesartan (BENICAR) 20 MG tablet Take 1 tablet (20 mg total) by mouth daily. 03/13/21  Yes Troy Sine, MD  ?rosuvastatin (CRESTOR) 5 MG tablet Take 5 mg by mouth daily. 03/29/21  Yes [provider]  ?amLODipine-atorvastatin (CADUET) 10-80 MG tablet Take 1 tablet by mouth in the evening ?Patient not taking: Reported on 07/09/2021 03/12/21   Troy Sine, MD  ?gabapentin (NEURONTIN) 300 MG capsule Take 300 mg by mouth in the morning, at noon, and at bedtime. ?Patient not taking: Reported on 07/09/2021 05/06/21   [provider]  ?   ? ?Allergies    ?Patient has no known allergies.   ? ?Review of Systems   ?Review of Systems ? ?Physical Exam ?Updated Vital Signs ?BP 136/72  Pulse 67   Resp (!) 23   SpO2 98%  ?Physical Exam ?Vitals and nursing note reviewed.  ?Constitutional:   ?   General: She is not in acute distress. ?   Appearance: She is well-developed. She is not diaphoretic.  ?HENT:  ?   Head: Normocephalic and atraumatic.  ?Eyes:  ?   Pupils: Pupils are equal, round, and reactive to light.  ?Cardiovascular:  ?   Rate and Rhythm: Normal rate and regular rhythm.  ?   Heart sounds: No murmur heard. ?  No friction rub. No gallop.  ?Pulmonary:  ?   Effort: Pulmonary effort is normal.  ?   Breath sounds: No wheezing or rales.  ?Abdominal:  ?   General: There is no distension.  ?   Palpations: Abdomen is soft.  ?   Tenderness: There is no  abdominal tenderness.  ?Musculoskeletal:     ?   General: Tenderness present.  ?   Cervical back: Normal range of motion and neck supple.  ?   Comments: Tenderness to the right chest wall reproduces her discomfort.  ?Skin: ?   General: Skin is warm and dry.  ?Neurological:  ?   Mental Status: She is alert and oriented to person, place, and time.  ?Psychiatric:     ?   Behavior: Behavior normal.  ? ? ?ED Results / Procedures / Treatments   ?Labs ?(all labs ordered are listed, but only abnormal results are displayed) ?Labs Reviewed  ?BASIC METABOLIC PANEL - Abnormal; Notable for the following components:  ?    Result Value  ? Glucose, Bld 159 (*)   ? Calcium 8.7 (*)   ? All other components within normal limits  ?CBC - Abnormal; Notable for the following components:  ? WBC 12.7 (*)   ? RBC 2.85 (*)   ? Hemoglobin 8.6 (*)   ? HCT 25.9 (*)   ? All other components within normal limits  ?D-DIMER, QUANTITATIVE - Abnormal; Notable for the following components:  ? D-Dimer, Quant 4.41 (*)   ? All other components within normal limits  ?BRAIN NATRIURETIC PEPTIDE - Abnormal; Notable for the following components:  ? B Natriuretic Peptide 762.9 (*)   ? All other components within normal limits  ?CBG MONITORING, ED - Abnormal; Notable for the following components:  ? Glucose-Capillary 147 (*)   ? All other components within normal limits  ?TROPONIN I (HIGH SENSITIVITY) - Abnormal; Notable for the following components:  ? Troponin I (High Sensitivity) 67 (*)   ? All other components within normal limits  ?TROPONIN I (HIGH SENSITIVITY) - Abnormal; Notable for the following components:  ? Troponin I (High Sensitivity) 91 (*)   ? All other components within normal limits  ?CULTURE, BLOOD (ROUTINE X 2)  ?CULTURE, BLOOD (ROUTINE X 2)  ?C-REACTIVE PROTEIN  ?PROCALCITONIN  ? ? ?EKG ?EKG Interpretation ? ?Date/Time:  Monday July 09 2021 01:42:44 EDT ?Ventricular Rate:  76 ?PR Interval:  146 ?QRS Duration: 96 ?QT Interval:  372 ?QTC  Calculation: 419 ?R Axis:   74 ?Text Interpretation: Sinus rhythm Borderline repolarization abnormality No significant change since last tracing Confirmed by Deno Etienne [phone]) on 07/09/2021 1:52:31 AM ? ? EKG Interpretation ? ?Date/Time:  Monday July 09 2021 04:13:37 EDT ?Ventricular Rate:  71 ?PR Interval:  146 ?QRS Duration: 96 ?QT Interval:  385 ?QTC Calculation: 419 ?R Axis:   73 ?Text Interpretation: Sinus rhythm Borderline repolarization abnormality No significant change since last tracing Confirmed by Tyrone Nine,  Linna Hoff [phone]) on 07/09/2021 4:16:01 AM ?  ? ?  ? ? ?Radiology ?CT Angio Chest PE W and/or Wo Contrast ? ?Result Date: 07/09/2021 ?CLINICAL DATA:  Chest pain earlier today, since resolved. EXAM: CT ANGIOGRAPHY CHEST CT ABDOMEN AND PELVIS WITH CONTRAST TECHNIQUE: Multidetector CT imaging of the chest was performed using the standard protocol during bolus administration of intravenous contrast. Multiplanar CT image reconstructions and MIPs were obtained to evaluate the vascular anatomy. Multidetector CT imaging of the abdomen and pelvis was performed using the standard protocol during bolus administration of intravenous contrast. RADIATION DOSE REDUCTION: This exam was performed according to the departmental dose-optimization program which includes automated exposure control, adjustment of the mA and/or kV according to patient size and/or use of iterative reconstruction technique. CONTRAST:  74m OMNIPAQUE IOHEXOL 350 MG/ML SOLN COMPARISON:  CT abdomen and pelvis with no contrast 06/29/2019. FINDINGS: CTA CHEST FINDINGS Cardiovascular: The pulmonary arteries are upper limits of normal in caliber with no evidence of arterial emboli. No evidence of acute right heart strain. There is mild cardiomegaly with left chamber predominance. There are scattered three-vessel calcifications in the coronary arteries. There are no pulmonary venous dilatation. There is moderate to heavy aortic atherosclerosis, scattered  calcifications in the great vessels without stenosis. The plaques predominantly calcific in the arch and mixed in the descending segment with scattered ulcerative soft plaque but no penetrating ulcer or dissection or

## 2021-07-10 ENCOUNTER — Encounter (HOSPITAL_COMMUNITY): Payer: Self-pay | Admitting: Internal Medicine

## 2021-07-10 DIAGNOSIS — I5031 Acute diastolic (congestive) heart failure: Secondary | ICD-10-CM | POA: Diagnosis not present

## 2021-07-10 DIAGNOSIS — I5032 Chronic diastolic (congestive) heart failure: Secondary | ICD-10-CM

## 2021-07-10 DIAGNOSIS — I2510 Atherosclerotic heart disease of native coronary artery without angina pectoris: Secondary | ICD-10-CM | POA: Diagnosis not present

## 2021-07-10 DIAGNOSIS — I509 Heart failure, unspecified: Secondary | ICD-10-CM | POA: Diagnosis not present

## 2021-07-10 DIAGNOSIS — E876 Hypokalemia: Secondary | ICD-10-CM

## 2021-07-10 DIAGNOSIS — I5030 Unspecified diastolic (congestive) heart failure: Secondary | ICD-10-CM

## 2021-07-10 DIAGNOSIS — R079 Chest pain, unspecified: Secondary | ICD-10-CM | POA: Diagnosis not present

## 2021-07-10 LAB — CBC WITH DIFFERENTIAL/PLATELET
Abs Immature Granulocytes: 0.08 10*3/uL — ABNORMAL HIGH (ref 0.00–0.07)
Basophils Absolute: 0 10*3/uL (ref 0.0–0.1)
Basophils Relative: 0 %
Eosinophils Absolute: 0.2 10*3/uL (ref 0.0–0.5)
Eosinophils Relative: 3 %
HCT: 26.7 % — ABNORMAL LOW (ref 36.0–46.0)
Hemoglobin: 9 g/dL — ABNORMAL LOW (ref 12.0–15.0)
Immature Granulocytes: 1 %
Lymphocytes Relative: 29 %
Lymphs Abs: 2.6 10*3/uL (ref 0.7–4.0)
MCH: 30.4 pg (ref 26.0–34.0)
MCHC: 33.7 g/dL (ref 30.0–36.0)
MCV: 90.2 fL (ref 80.0–100.0)
Monocytes Absolute: 1 10*3/uL (ref 0.1–1.0)
Monocytes Relative: 11 %
Neutro Abs: 5.1 10*3/uL (ref 1.7–7.7)
Neutrophils Relative %: 56 %
Platelets: 301 10*3/uL (ref 150–400)
RBC: 2.96 MIL/uL — ABNORMAL LOW (ref 3.87–5.11)
RDW: 13.9 % (ref 11.5–15.5)
WBC: 9 10*3/uL (ref 4.0–10.5)
nRBC: 0 % (ref 0.0–0.2)

## 2021-07-10 LAB — GLUCOSE, CAPILLARY
Glucose-Capillary: 137 mg/dL — ABNORMAL HIGH (ref 70–99)
Glucose-Capillary: 141 mg/dL — ABNORMAL HIGH (ref 70–99)

## 2021-07-10 LAB — COMPREHENSIVE METABOLIC PANEL
ALT: 45 U/L — ABNORMAL HIGH (ref 0–44)
AST: 51 U/L — ABNORMAL HIGH (ref 15–41)
Albumin: 2.9 g/dL — ABNORMAL LOW (ref 3.5–5.0)
Alkaline Phosphatase: 64 U/L (ref 38–126)
Anion gap: 11 (ref 5–15)
BUN: 7 mg/dL — ABNORMAL LOW (ref 8–23)
CO2: 25 mmol/L (ref 22–32)
Calcium: 8.7 mg/dL — ABNORMAL LOW (ref 8.9–10.3)
Chloride: 105 mmol/L (ref 98–111)
Creatinine, Ser: 0.94 mg/dL (ref 0.44–1.00)
GFR, Estimated: >60 mL/min (ref >60)
Glucose, Bld: 129 mg/dL — ABNORMAL HIGH (ref 70–99)
Potassium: 2.9 mmol/L — ABNORMAL LOW (ref 3.5–5.1)
Sodium: 141 mmol/L (ref 135–145)
Total Bilirubin: 1.3 mg/dL — ABNORMAL HIGH (ref 0.3–1.2)
Total Protein: 6.1 g/dL — ABNORMAL LOW (ref 6.5–8.1)

## 2021-07-10 LAB — MAGNESIUM: Magnesium: 1.4 mg/dL — ABNORMAL LOW (ref 1.7–2.4)

## 2021-07-10 MED ORDER — POTASSIUM CHLORIDE 10 MEQ/100ML IV SOLN
10.0000 meq | INTRAVENOUS | Status: AC
  Administered 2021-07-10 (×2): 10 meq via INTRAVENOUS
  Filled 2021-07-10 (×2): qty 100

## 2021-07-10 MED ORDER — POTASSIUM CHLORIDE CRYS ER 10 MEQ PO TBCR: 10.0000 meq | EXTENDED_RELEASE_TABLET | Freq: Two times a day (BID) | ORAL | 0 refills | Status: DC

## 2021-07-10 MED ORDER — POTASSIUM CHLORIDE CRYS ER 20 MEQ PO TBCR
40.0000 meq | EXTENDED_RELEASE_TABLET | ORAL | Status: AC
  Administered 2021-07-10 (×2): 40 meq via ORAL
  Filled 2021-07-10: qty 2

## 2021-07-10 MED ORDER — MAGNESIUM SULFATE 4 GM/100ML IV SOLN
4.0000 g | Freq: Once | INTRAVENOUS | Status: AC
  Administered 2021-07-10: 4 g via INTRAVENOUS
  Filled 2021-07-10: qty 100

## 2021-07-10 NOTE — Progress Notes (Signed)
? ?Progress Note ? ?Patient Name: Debbie [name] ?Date of Encounter: 07/10/2021 ? ?[facility] Cardiologist: Shelva Majestic, MD  ? ?Subjective  ? ?Denies CP   Breathing is good   Had knee pain last week ? ?Inpatient Medications  ?  ?Scheduled Meds: ? aspirin EC  81 mg Oral Daily  ? enoxaparin (LOVENOX) injection  40 mg Subcutaneous Daily  ? ezetimibe  10 mg Oral Daily  ? furosemide  20 mg Intravenous BID  ? insulin aspart  0-15 Units Subcutaneous TID AC & HS  ? irbesartan  150 mg Oral Daily  ? metoprolol succinate  50 mg Oral Daily  ? mirabegron ER  50 mg Oral Daily  ? potassium chloride  40 mEq Oral Q4H  ? rosuvastatin  5 mg Oral Daily  ? ?Continuous Infusions: ? magnesium sulfate bolus IVPB 4 g (07/10/21 0803)  ? potassium chloride 10 mEq (07/10/21 0809)  ? ?PRN Meds: ?acetaminophen **OR** acetaminophen, hydrALAZINE, methocarbamol, nitroGLYCERIN, ondansetron **OR** ondansetron (ZOFRAN) IV, oxyCODONE-acetaminophen, polyethylene glycol  ? ?Vital Signs  ?  ?Vitals:  ? 07/09/21 1606 07/09/21 1933 07/10/21 0018 07/10/21 0408  ?BP: 129/67 (!) 144/69 (!) 135/56 (!) 165/62  ?Pulse: 72 62 60 61  ?Resp: '20 20 20 20  '$ ?Temp: 99.6 ?F (37.6 ?C) 98.9 ?F (37.2 ?C) 98.4 ?F (36.9 ?C) 98.3 ?F (36.8 ?C)  ?TempSrc: Oral Oral Oral Oral  ?SpO2: 91% 93% 91% 93%  ?Weight:    88.1 kg  ?Height:      ? ? ?Intake/Output Summary (Last 24 hours) at 07/10/2021 0847 ?Last data filed at 07/10/2021 0022 ?Gross per 24 hour  ?Intake 960 ml  ?Output 1100 ml  ?Net -140 ml  ? ? ?  07/10/2021  ?  4:08 AM 07/09/2021  ?  8:15 AM 07/04/2021  ?  8:14 AM  ?Last 3 Weights  ?Weight (lbs) 194 lb 3.6 oz 197 lb 8.5 oz 196 lb  ?Weight (kg) 88.1 kg 89.6 kg 88.905 kg  ?   ? ?Telemetry  ?  ? SR - Personally Reviewed ? ?ECG  ?  ? No new - Personally Reviewed ? ?Physical Exam  ? ?GEN: No acute distress.   ?Neck: No JVD ?Cardiac: RRR, no murmurs,  ?Respiratory: Clear to auscultation bilaterally. ?GI: Soft, nontender, non-distended  ?MS: No edema; No deformity. ?Neuro:   Nonfocal  ?Psych: Normal affect  ? ?Labs  ?  ?High Sensitivity Troponin:   ?Recent Labs  ?Lab 07/09/21 ?0157 07/09/21 ?0346 07/09/21 ?1020  ?TROPONINIHS 67* 91* 73*  ?   ?Chemistry ?Recent Labs  ?Lab 07/09/21 ?0157 07/10/21 ?0425  ?NA 138 141  ?K 4.0 2.9*  ?CL 106 105  ?CO2 23 25  ?GLUCOSE 159* 129*  ?BUN 8 7*  ?CREATININE 0.91 0.94  ?CALCIUM 8.7* 8.7*  ?MG  --  1.4*  ?PROT  --  6.1*  ?ALBUMIN  --  2.9*  ?AST  --  51*  ?ALT  --  45*  ?ALKPHOS  --  64  ?BILITOT  --  1.3*  ?GFRNONAA >60 >60  ?ANIONGAP 9 11  ?  ?Lipids  ?Recent Labs  ?Lab 07/09/21 ?0157  ?CHOL 127  ?TRIG 94  ?HDL 44  ?[place] 64  ?CHOLHDL 2.9  ?  ?Hematology ?Recent Labs  ?Lab 07/09/21 ?0157 07/10/21 ?0425  ?WBC 12.7* 9.0  ?RBC 2.85* 2.96*  ?HGB 8.6* 9.0*  ?HCT 25.9* 26.7*  ?MCV 90.9 90.2  ?MCH 30.2 30.4  ?MCHC 33.2 33.7  ?RDW 13.7 13.9  ?PLT 267  301  ? ?Thyroid No results for input(s): TSH, FREET4 in the last 168 hours.  ?BNP ?Recent Labs  ?Lab 07/09/21 ?0332  ?BNP 762.9*  ?  ?DDimer  ?Recent Labs  ?Lab 07/09/21 ?0157  ?DDIMER 4.41*  ?  ? ?Radiology  ?  ?CT Angio Chest PE W and/or Wo Contrast ? ?Result Date: 07/09/2021 ?CLINICAL DATA:  Chest pain earlier today, since resolved. EXAM: CT ANGIOGRAPHY CHEST CT ABDOMEN AND PELVIS WITH CONTRAST TECHNIQUE: Multidetector CT imaging of the chest was performed using the standard protocol during bolus administration of intravenous contrast. Multiplanar CT image reconstructions and MIPs were obtained to evaluate the vascular anatomy. Multidetector CT imaging of the abdomen and pelvis was performed using the standard protocol during bolus administration of intravenous contrast. RADIATION DOSE REDUCTION: This exam was performed according to the departmental dose-optimization program which includes automated exposure control, adjustment of the mA and/or kV according to patient size and/or use of iterative reconstruction technique. CONTRAST:  5m OMNIPAQUE IOHEXOL 350 MG/ML SOLN COMPARISON:  CT abdomen and pelvis  with no contrast 06/29/2019. FINDINGS: CTA CHEST FINDINGS Cardiovascular: The pulmonary arteries are upper limits of normal in caliber with no evidence of arterial emboli. No evidence of acute right heart strain. There is mild cardiomegaly with left chamber predominance. There are scattered three-vessel calcifications in the coronary arteries. There are no pulmonary venous dilatation. There is moderate to heavy aortic atherosclerosis, scattered calcifications in the great vessels without stenosis. The plaques predominantly calcific in the arch and mixed in the descending segment with scattered ulcerative soft plaque but no penetrating ulcer or dissection or aneurysm. Mediastinum/Nodes: There is a heterogeneous 1.1 cm nodule in the left lobe of the thyroid gland. The lower poles of the thyroid otherwise unremarkable. Imaging follow-up is not required for a nodule of this size. There is no esophageal thickening. The trachea is clear. There are shotty subcentimeter in short axis bilateral hilar and mediastinal nodes but no enlarged intrathoracic or axillary nodes no chest wall mass. Lungs/Pleura: There are bilateral trace layering pleural effusions. There is mild interstitial edema in the base and apices of both lungs. Findings are superimposed on chronic appearing subpleural reticulation with a basal gradient without honeycombing. There are thickened central bronchi in the bilateral upper and left lower lobes, and patchy haziness asymmetrically in the infrahilar left lower lobe which could be atelectasis or bronchopneumonia. There is additional faint patchy ground-glass opacity in the left upper lobe posteriorly which could be ground-glass edema or additional pneumonitis. Rest of the lung fields are clear. There is no pneumothorax or pleural thickening. Musculoskeletal: There is a mild thoracic kyphodextroscoliosis, osteopenia and degenerative change of the spine. Review of the MIP images confirms the above findings.  CT ABDOMEN and PELVIS FINDINGS Hepatobiliary: 17 cm length mildly steatotic liver. No mass. Gallbladder absent without biliary dilatation. Pancreas: Unremarkable. Spleen: Normal in size and enhancement. Adrenals/Urinary Tract: There is no right adrenal abnormality. There is a 1.6 cm stable left adrenal nodule. There are small bilateral renal cysts and a wedge-shaped scar defect in the upper pole right kidney small calcification in the underlying cortex. There is no other evidence of nephrolithiasis. There is no hydronephrosis or ureteral stone no bladder thickening. Stomach/Bowel: No dilatation or wall thickening including the appendix. Moderate stool retention ascending and transverse colon is seen, diffuse diverticulosis without evidence of colitis or diverticulitis. Vascular/Lymphatic: Heavy aortoiliac calcific disease with additional moderate calcification at the renal artery ostia. No AAA. No adenopathy. Reproductive: Status post hysterectomy. No adnexal  masses. Other: Small umbilical fat hernia. No incarcerated hernia. Pelvic phleboliths. No free air, hemorrhage or fluid. Musculoskeletal: Dorsal midline subcutaneous fluid collection containing air pockets is seen at the level of L2-4 measuring 3.1 x 2.5 x 4.2 cm AP, coronal and craniocaudal, respectively. There are adjacent stranding opacities in the fat. Interval new postsurgical changes. There is L3-5 posterior fusion rods and pedicle screws, age indeterminate. There is scattered air in the dorsal epidural space at L3. The hardware is grossly intact and there are interbody bone plugs with disc space apparatus at both levels. Mild broad-based thoracolumbar levoscoliosis is again shown. Review of the MIP images confirms the above findings. IMPRESSION: 1. Upper-normal pulmonary arterial calibers without evidence of thromboemboli. 2. Cardiomegaly with interstitial edema and trace pleural effusions compatible with mild CHF or fluid overload. No pericardial  effusion. 3. 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 4. Bilateral upper left lower l

## 2021-07-10 NOTE — Care Management Obs Status (Signed)
MEDICARE OBSERVATION STATUS NOTIFICATION ? ? ?Patient Details  ?Name: Debbie [name] ?MRN: [id number] ?Date of Birth: December 27, 1945 ? ? ?Medicare Observation Status Notification Given:  Yes ? ? ? ?Zenon Mayo, RN ?07/10/2021, 12:07 PM ?

## 2021-07-10 NOTE — Discharge Instructions (Signed)
Follow with Primary MD Sandi Mariscal, MD in 7 days  ? ?Get CBC, CMP,  checked  by Primary MD next visit.  ? ? ?Activity: As tolerated with Full fall precautions use walker/cane & assistance as needed ? ? ?Disposition Home  ? ? ?Diet: Heart Healthy   ? ?For Heart failure patients - Check your Weight same time everyday, if you gain over 2 pounds, or you develop in leg swelling, experience more shortness of breath or chest pain, call your Primary MD immediately. Follow Cardiac Low Salt Diet and 1.5 lit/day fluid restriction. ? ? ?On your next visit with your primary care physician please Get Medicines reviewed and adjusted. ? ? ?Please request your Prim.MD to go over all Hospital Tests and Procedure/Radiological results at the follow up, please get all Hospital records sent to your Prim MD by signing hospital release before you go home. ? ? ?If you experience worsening of your admission symptoms, develop shortness of breath, life threatening emergency, suicidal or homicidal thoughts you must seek medical attention immediately by calling 911 or calling your MD immediately  if symptoms less severe. ? ?You Must read complete instructions/literature along with all the possible adverse reactions/side effects for all the Medicines you take and that have been prescribed to you. Take any new Medicines after you have completely understood and accpet all the possible adverse reactions/side effects.  ? ?Do not drive, operating heavy machinery, perform activities at heights, swimming or participation in water activities or provide baby sitting services if your were admitted for syncope or siezures until you have seen by Primary MD or a Neurologist and advised to do so again. ? ?Do not drive when taking Pain medications.  ? ? ?Do not take more than prescribed Pain, Sleep and Anxiety Medications ? ?Special Instructions: If you have smoked or chewed Tobacco  in the last 2 yrs please stop smoking, stop any regular Alcohol  and or any  Recreational drug use. ? ?Wear Seat belts while driving. ? ? ?Please note ? ?You were cared for by a hospitalist during your hospital stay. If you have any questions about your discharge medications or the care you received while you were in the hospital after you are discharged, you can call the unit and asked to speak with the hospitalist on call if the hospitalist that took care of you is not available. Once you are discharged, your primary care physician will handle any further medical issues. Please note that NO REFILLS for any discharge medications will be authorized once you are discharged, as it is imperative that you return to your primary care physician (or establish a relationship with a primary care physician if you do not have one) for your aftercare needs so that they can reassess your need for medications and monitor your lab values.  ?

## 2021-07-10 NOTE — TOC Transition Note (Signed)
Transition of Care (TOC) - CM/SW Discharge Note ? ? ?Patient Details  ?Name: Debbie [name] ?MRN: [id number] ?Date of Birth: Aug 17, 1945 ? ?Transition of Care (TOC) CM/SW Contact:  ?Zenon Mayo, RN ?Phone Number: ?07/10/2021, 12:12 PM ? ? ?Clinical Narrative:    ?Patient is for dc today, , NCM notified Cory with [facility] of dc today. Spouse will transport her home.  ? ? ?Final next level of care: [facility] ?Barriers to Discharge: Continued Medical Work up ? ? ?Patient Goals and CMS Choice ?Patient states their goals for this hospitalization and ongoing recovery are:: return home ?CMS Medicare.gov Compare Post Acute Care list provided to:: Patient ?Choice offered to / list presented to : Patient ? ?Discharge Placement ?  ?           ?  ?  ?  ?  ? ?Discharge Plan and Services ?  ?Discharge Planning Services: CM Consult ?           ?  ?DME Agency: NA ?  ?  ?  ?HH Arranged: PT, OT ?[place] [facility] Agency: [facility] ?Date HH Agency Contacted: 07/09/21 ?Time [place]: [id number] ?Representative spoke with at [place]: Tommi Rumps ? ?Social Determinants of Health (SDOH) Interventions ?Food Insecurity Interventions: Intervention Not Indicated ?Financial Strain Interventions: Intervention Not Indicated ?Housing Interventions: Intervention Not Indicated ?Transportation Interventions: Intervention Not Indicated ? ? ?Readmission Risk Interventions ?   ? View : No data to display.  ?  ?  ?  ? ? ? ? ? ?

## 2021-07-10 NOTE — Progress Notes (Signed)
Physical Therapy Treatment ?Patient Details ?Name: Debbie [name] ?MRN: [id number] ?DOB: [birth year] ?Today's Date: 07/10/2021 ? ? ?History of Present Illness 76 year old female admitted 3/27 who presents to [facility] emergency department with complaints of chest discomfort and shortness of breath.  She was admitted for evaluation for acute heart failure and chest pain. Pt just d/c'd home on 3/23 after L3-L5 fusion.  Neuro reviewed CT that showed superior endplate fracture of L5 with subsidence of the grafts into the vertebral body by a about 10 mm with no hardware loosening and neuro ok'd PT/OT.   PMH:  remote stroke (1998), coronary artery disease (3 stents to RCA 2001, last cath 2006 showed stents patent), non insulin dependent diabetes mellitus type 2, hyperlipidemia, hypertension, paroxysmal atrial fibrillation (Dx 2013, not on anticoagulation) ? ?  ?PT Comments  ? ? Pt admitted with above diagnosis. Pt was able to ambulate in hallway with RW with min guard to min assist. Pt progressing overall. Needs min assist at times for safety.   Pt currently with functional limitations due to balance and endurance deficits. Pt will benefit from skilled PT to increase their independence and safety with mobility to allow discharge to the venue listed below.      ?Recommendations for follow up therapy are one component of a multi-disciplinary discharge planning process, led by the attending physician.  Recommendations may be updated based on patient status, additional functional criteria and insurance authorization. ? ?Follow Up Recommendations ? Home health PT ?  ?  ?Assistance Recommended at Discharge Set up Supervision/Assistance  ?Patient can return home with the following A little help with walking and/or transfers;A little help with bathing/dressing/bathroom;Help with stairs or ramp for entrance ?  ?Equipment Recommendations ? None recommended by PT  ?  ?Recommendations for Other Services   ? ? ?  ?Precautions /  Restrictions Precautions ?Precautions: Back ?Precaution Booklet Issued: Yes (comment) ?Precaution Comments: Reviewed precautions and pt verbalized them all ?Required Braces or Orthoses: Spinal Brace ?Spinal Brace: Lumbar corset;Applied in sitting position ?Restrictions ?Weight Bearing Restrictions: No  ?  ? ?Mobility ? Bed Mobility ?Overal bed mobility: Needs Assistance ?Bed Mobility: Rolling, Sidelying to Sit ?Rolling: Min assist ?Sidelying to sit: Min assist ?  ?  ?  ?General bed mobility comments: cueds needed for log roll and assist to complete movement.  Placed brace on in sitting at EOB. ?  ? ?Transfers ?Overall transfer level: Needs assistance ?Equipment used: Rolling walker (2 wheels) ?Transfers: Sit to/from Stand ?Sit to Stand: Min assist ?  ?  ?  ?  ?  ?General transfer comment: Good use UEs to power up into standing once cued for hand placement, increased time needed, good eccentric control to return to sitting ?  ? ?Ambulation/Gait ?Ambulation/Gait assistance: Min assist ?Gait Distance (Feet): 200 Feet ?Assistive device: Rolling walker (2 wheels) ?Gait Pattern/deviations: Step-through pattern, Decreased stride length, Trunk flexed, Drifts right/left, Antalgic ?Gait velocity: Decreased ?Gait velocity interpretation: <1.31 ft/sec, indicative of household ambulator ?  ?General Gait Details: VC's for improved posture, closer walker proximity, and forward gaze. Min guard assist and min cues with RW for suport. ? ? ?Stairs ?  ?  ?  ?  ?  ? ? ?Wheelchair Mobility ?  ? ?Modified Rankin (Stroke Patients Only) ?  ? ? ?  ?Balance Overall balance assessment: Needs assistance ?Sitting-balance support: Feet supported, No upper extremity supported ?Sitting balance-Leahy Scale: Fair ?Sitting balance - Comments: can sit without UE support ?  ?Standing balance support: No  upper extremity supported, During functional activity, Bilateral upper extremity supported, Reliant on assistive device for balance ?Standing  balance-Leahy Scale: Poor ?Standing balance comment: could not stand without UE support and without RW. ?  ?  ?  ?  ?  ?  ?  ?  ?  ?  ?  ?  ? ?  ?Cognition Arousal/Alertness: Awake/alert ?Behavior During Therapy: [facility] for tasks assessed/performed ?Overall Cognitive Status: History of cognitive impairments - at baseline ?  ?  ?  ?  ?  ?  ?  ?  ?  ?  ?  ?  ?  ?  ?  ?  ?General Comments: husband telling NT that patient has memory impairments at baseline ?  ?  ? ?  ?Exercises   ? ?  ?General Comments General comments (skin integrity, edema, etc.): VSS ?  ?  ? ?Pertinent Vitals/Pain Pain Assessment ?Pain Assessment: Faces ?Faces Pain Scale: Hurts little more ?Breathing: normal ?Pain Location: Incision ?Pain Descriptors / Indicators: Aching, Grimacing, Guarding ?Pain Intervention(s): Limited activity within patient's tolerance, Monitored during session, Repositioned  ? ? ?Home Living   ?  ?  ?  ?  ?  ?  ?  ?  ?  ?   ?  ?Prior Function    ?  ?  ?   ? ?PT Goals (current goals can now be found in the care plan section) Acute Rehab PT Goals ?Patient Stated Goal: pain relief ?Progress towards PT goals: Progressing toward goals ? ?  ?Frequency ? ? ? Min 5X/week ? ? ? ?  ?PT Plan Current plan remains appropriate  ? ? ?Co-evaluation   ?  ?  ?  ?  ? ?  ?AM-PAC PT "6 Clicks" Mobility   ?Outcome Measure ? Help needed turning from your back to your side while in a flat bed without using bedrails?: A Little ?Help needed moving from lying on your back to sitting on the side of a flat bed without using bedrails?: A Little ?Help needed moving to and from a bed to a chair (including a wheelchair)?: A Little ?Help needed standing up from a chair using your arms (e.g., wheelchair or bedside chair)?: A Little ?Help needed to walk in hospital room?: A Little ?Help needed climbing 3-5 steps with a railing? : A Lot ?6 Click Score: 17 ? ?  ?End of Session Equipment Utilized During Treatment: Gait belt;Back brace ?Activity Tolerance: Patient  tolerated treatment well ?Patient left: in chair;with call bell/phone within reach;with chair alarm set ?Nurse Communication: Mobility status ?PT Visit Diagnosis: Unsteadiness on feet (R26.81);Pain ?Pain - part of body:  (back) ?  ? ? ?Time: 1561-5379 ?PT Time Calculation (min) (ACUTE ONLY): 15 min ? ?Charges:  $Gait Training: 8-22 mins          ?          ? ?Sheneka Schrom M,PT ?Acute Rehab Services ?[phone] ?[phone] (pager)  ? ? ?Alvira Philips ?07/10/2021, 11:55 AM ? ?

## 2021-07-10 NOTE — Care Management CC44 (Signed)
Condition Code 44 Documentation Completed ? ?Patient Details  ?Name: Debbie [name] ?MRN: [id number] ?Date of Birth: 1946/04/05 ? ? ?Condition Code 44 given:  Yes ?Patient signature on Condition Code 44 notice:  Yes ?Documentation of 2 MD's agreement:  Yes ?Code 44 added to claim:  Yes ? ? ? ?Zenon Mayo, RN ?07/10/2021, 12:07 PM ? ?

## 2021-07-10 NOTE — Discharge Summary (Signed)
Physician Discharge Summary  ?Debbie [name] ASN:[id number] DOB: 23-Jun-1945 DOA: 07/09/2021 ? ?PCP: Debbie Mariscal, MD ? ?Admit date: 07/09/2021 ?Discharge date: 07/10/2021 ? ?Admitted From: Home ?Disposition:  Home ? ?Recommendations for Outpatient Follow-up:  ?Follow up with PCP in 1-2 weeks ?Please obtain BMP/CBC in one week ? ?Home Health:YES ? ? ?Discharge Condition:Stable ?CODE STATUS:FULL ?Diet recommendation: Heart Healthy  ? ? ? ?Brief/Interim Summary: ? ?76 year old female with past medical history of remote stroke (1998), coronary artery disease (3 stents to RCA 2001, last cath 2006 showed stents patent), non insulin dependent diabetes mellitus type 2, hyperlipidemia, hypertension, paroxysmal atrial fibrillation (Dx 2013, not on anticoagulation) who presents to [facility] emergency department with complaints of chest discomfort and shortness of breath.  She was admitted for evaluation for acute heart failure and chest pain. ? ?Acute heart failure with preserved ejection fraction ?-She presents with acute episode of shortness of breath, elevated BNP she was a few days from surgery likely she received more IV fluid than what she can handle, cardiology input greatly appreciated, 2D echo was obtained with a preserved EF, she received IV diuresis during hospital stay, report respiratory back to baseline currently, she is to be resumed back on her home dose Lasix 20 mg oral daily. ? ?Chest pain ?-due to above, ?  ?Coronary artery disease involving native coronary artery of native heart ?-Continue with aspirin, Zetia, metoprolol, statin and olmesartan ? ?Hyperlipidemia ?-Zetia and statin ? ?Hypertension ?-Resume home medications ? ?Hypokalemia/hypomagnesemia ?-Likely due to IV diuresis, it was repleted before discharge. ?  ?S/P lumbar fusion ?- Patient is status post recent neurosurgical procedure on 3/22   ?-She was seen by neurosurgery during hospital stay, PT/OT, ?  ?  ? ? ?Discharge Diagnoses:  ?Principal  Problem: ?  Acute congestive heart failure ([place]) ?Active Problems: ?  Chest pain ?  Coronary artery disease involving native coronary artery of native heart ?  Type 2 diabetes mellitus without complication, without long-term current use of insulin ([place]) ?  PAF (paroxysmal atrial fibrillation) ([place]) ?  Essential hypertension ?  Mixed diabetic hyperlipidemia associated with type 2 diabetes mellitus ([place]) ?  S/P lumbar fusion ?  (HFpEF) heart failure with preserved ejection fraction ([place]) ? ? ? ?Discharge Instructions ? ?Discharge Instructions   ? ? Diet - low sodium heart healthy   Complete by: As directed ?  ? Discharge instructions   Complete by: As directed ?  ? Follow with Primary MD Debbie Mariscal, MD in 7 days  ? ?Get CBC, CMP,  checked  by Primary MD next visit.  ? ? ?Activity: As tolerated with Full fall precautions use walker/cane & assistance as needed ? ? ?Disposition Home  ? ? ?Diet: Heart Healthy   ? ?For Heart failure patients - Check your Weight same time everyday, if you gain over 2 pounds, or you develop in leg swelling, experience more shortness of breath or chest pain, call your Primary MD immediately. Follow Cardiac Low Salt Diet and 1.5 lit/day fluid restriction. ? ? ?On your next visit with your primary care physician please Get Medicines reviewed and adjusted. ? ? ?Please request your Prim.MD to go over all Hospital Tests and Procedure/Radiological results at the follow up, please get all Hospital records sent to your Prim MD by signing hospital release before you go home. ? ? ?If you experience worsening of your admission symptoms, develop shortness of breath, life threatening emergency, suicidal or homicidal thoughts you must seek medical attention immediately by  calling 911 or calling your MD immediately  if symptoms less severe. ? ?You Must read complete instructions/literature along with all the possible adverse reactions/side effects for all the Medicines you take and that have been prescribed  to you. Take any new Medicines after you have completely understood and accpet all the possible adverse reactions/side effects.  ? ?Do not drive, operating heavy machinery, perform activities at heights, swimming or participation in water activities or provide baby sitting services if your were admitted for syncope or siezures until you have seen by Primary MD or a Neurologist and advised to do so again. ? ?Do not drive when taking Pain medications.  ? ? ?Do not take more than prescribed Pain, Sleep and Anxiety Medications ? ?Special Instructions: If you have smoked or chewed Tobacco  in the last 2 yrs please stop smoking, stop any regular Alcohol  and or any Recreational drug use. ? ?Wear Seat belts while driving. ? ? ?Please note ? ?You were cared for by a hospitalist during your hospital stay. If you have any questions about your discharge medications or the care you received while you were in the hospital after you are discharged, you can call the unit and asked to speak with the hospitalist on call if the hospitalist that took care of you is not available. Once you are discharged, your primary care physician will handle any further medical issues. Please note that NO REFILLS for any discharge medications will be authorized once you are discharged, as it is imperative that you return to your primary care physician (or establish a relationship with a primary care physician if you do not have one) for your aftercare needs so that they can reassess your need for medications and monitor your lab values.  ? Increase activity slowly   Complete by: As directed ?  ? No wound care   Complete by: As directed ?  ? ?  ? ?Allergies as of 07/10/2021   ?No Known Allergies ?  ? ?  ?Medication List  ?  ? ?STOP taking these medications   ? ?amLODipine-atorvastatin 10-80 MG tablet ?Commonly known as: CADUET ?  ? ?  ? ?TAKE these medications   ? ?acetaminophen 500 MG tablet ?Commonly known as: TYLENOL ?Take 2 tablets (1,000 mg  total) by mouth every 8 (eight) hours as needed. ?  ?aspirin EC 81 MG tablet ?Take 1 tablet (81 mg total) by mouth daily. ?  ?Centrum Silver Adult 50+ Tabs ?Take 1 tablet by mouth daily. ?  ?ezetimibe 10 MG tablet ?Commonly known as: ZETIA ?Take 1 tablet by mouth once daily ?  ?furosemide 20 MG tablet ?Commonly known as: LASIX ?Take 1 tablet by mouth once daily ?  ?gabapentin 300 MG capsule ?Commonly known as: NEURONTIN ?Take 300 mg by mouth in the morning, at noon, and at bedtime. ?  ?HYDROcodone-acetaminophen 5-325 MG tablet ?Commonly known as: NORCO/VICODIN ?Take 1 tablet by mouth every 4 (four) hours as needed for moderate pain. ?  ?metFORMIN 500 MG tablet ?Commonly known as: GLUCOPHAGE ?Take 500 mg by mouth 2 (two) times daily. ?  ?methocarbamol 500 MG tablet ?Commonly known as: Robaxin ?Take 1 tablet (500 mg total) by mouth 4 (four) times daily. ?  ?metoprolol succinate 50 MG 24 hr tablet ?Commonly known as: TOPROL-XL ?TAKE 1 TABLET BY MOUTH ONCE DAILY IMMEDIATELY FOLLOWING A MEAL ?What changed: See the new instructions. ?  ?Myrbetriq 50 MG Tb24 tablet ?Generic drug: mirabegron ER ?Take 50 mg by mouth daily. ?  ?  nitroGLYCERIN 0.4 MG SL tablet ?Commonly known as: NITROSTAT ?Place 1 tablet (0.4 mg total) under the tongue every 5 (five) minutes as needed for chest pain. ?  ?olmesartan 20 MG tablet ?Commonly known as: BENICAR ?Take 1 tablet (20 mg total) by mouth daily. ?  ?potassium chloride 10 MEQ tablet ?Commonly known as: KLOR-CON M ?Take 1 tablet (10 mEq total) by mouth 2 (two) times daily for 3 days. ?  ?rosuvastatin 5 MG tablet ?Commonly known as: CRESTOR ?Take 5 mg by mouth daily. ?  ? ?  ? ? Follow-up Information   ? ? Care, [facility] Follow up.   ?Specialty: Home Health Services ?Why: [facility] [place] will call you with apt times ?Contact information: ?[address] ?[address] ?[place] [address] ?[phone] ? ? ?  ?  ? ? Debbie Mariscal, MD. Go on 07/24/2021.   ?Specialty: Internal Medicine ?Why:  '@10'$ :15am ?Contact information: ?[address] ?[place] [address] ?[phone] ? ? ?  ?  ? ? Troy Sine, MD .   ?Specialty: Cardiology ?Contact information: ?[address] ?[address] ?[place]

## 2021-07-10 NOTE — Progress Notes (Signed)
Heart Failure Navigator Progress Note ? ?Assessed for Heart & Vascular TOC clinic readiness.  ?Patient does not meet criteria due to recent back surgery, patient non compliant with lasix per husband. Patient educated on the importance of taking all medication as prescribed and daily weights, as well as fluid and diet restrictions. Patient voiced understanding . .  ? ? ? ?Earnestine Leys, BSN, RN ?Heart Failure Nurse Navigator ?[phone]   ?

## 2021-07-12 ENCOUNTER — Encounter (HOSPITAL_COMMUNITY): Payer: Self-pay | Admitting: Neurological Surgery

## 2021-07-14 LAB — CULTURE, BLOOD (ROUTINE X 2)
Culture: NO GROWTH
Culture: NO GROWTH
Special Requests: ADEQUATE

## 2021-07-25 ENCOUNTER — Other Ambulatory Visit: Payer: Self-pay

## 2021-07-25 ENCOUNTER — Emergency Department (HOSPITAL_COMMUNITY): Payer: Medicare PPO

## 2021-07-25 ENCOUNTER — Emergency Department (HOSPITAL_COMMUNITY)
Admission: EM | Admit: 2021-07-25 | Discharge: 2021-07-25 | Disposition: A | Discharge status: Home / Self Care | Payer: Medicare PPO | Attending: Emergency Medicine | Admitting: Emergency Medicine

## 2021-07-25 ENCOUNTER — Encounter (HOSPITAL_COMMUNITY): Payer: Self-pay

## 2021-07-25 ENCOUNTER — Telehealth: Payer: Self-pay | Admitting: Cardiovascular Disease

## 2021-07-25 DIAGNOSIS — Z7984 Long term (current) use of oral hypoglycemic drugs: Secondary | ICD-10-CM | POA: Diagnosis not present

## 2021-07-25 DIAGNOSIS — I509 Heart failure, unspecified: Secondary | ICD-10-CM | POA: Diagnosis not present

## 2021-07-25 DIAGNOSIS — I480 Paroxysmal atrial fibrillation: Secondary | ICD-10-CM | POA: Diagnosis not present

## 2021-07-25 DIAGNOSIS — E119 Type 2 diabetes mellitus without complications: Secondary | ICD-10-CM | POA: Diagnosis not present

## 2021-07-25 DIAGNOSIS — Z7901 Long term (current) use of anticoagulants: Secondary | ICD-10-CM | POA: Insufficient documentation

## 2021-07-25 DIAGNOSIS — R0602 Shortness of breath: Secondary | ICD-10-CM | POA: Diagnosis present

## 2021-07-25 LAB — CBC WITH DIFFERENTIAL/PLATELET
Abs Immature Granulocytes: 0.03 10*3/uL (ref 0.00–0.07)
Basophils Absolute: 0 10*3/uL (ref 0.0–0.1)
Basophils Relative: 0 %
Eosinophils Absolute: 0.1 10*3/uL (ref 0.0–0.5)
Eosinophils Relative: 1 %
HCT: 35.3 % — ABNORMAL LOW (ref 36.0–46.0)
Hemoglobin: 11.2 g/dL — ABNORMAL LOW (ref 12.0–15.0)
Immature Granulocytes: 0 %
Lymphocytes Relative: 25 %
Lymphs Abs: 2.1 10*3/uL (ref 0.7–4.0)
MCH: 30.6 pg (ref 26.0–34.0)
MCHC: 31.7 g/dL (ref 30.0–36.0)
MCV: 96.4 fL (ref 80.0–100.0)
Monocytes Absolute: 0.7 10*3/uL (ref 0.1–1.0)
Monocytes Relative: 8 %
Neutro Abs: 5.6 10*3/uL (ref 1.7–7.7)
Neutrophils Relative %: 66 %
Platelets: 381 10*3/uL (ref 150–400)
RBC: 3.66 MIL/uL — ABNORMAL LOW (ref 3.87–5.11)
RDW: 15.4 % (ref 11.5–15.5)
WBC: 8.6 10*3/uL (ref 4.0–10.5)
nRBC: 0 % (ref 0.0–0.2)

## 2021-07-25 LAB — COMPREHENSIVE METABOLIC PANEL
ALT: 21 U/L (ref 0–44)
AST: 18 U/L (ref 15–41)
Albumin: 3.5 g/dL (ref 3.5–5.0)
Alkaline Phosphatase: 127 U/L — ABNORMAL HIGH (ref 38–126)
Anion gap: 8 (ref 5–15)
BUN: 12 mg/dL (ref 8–23)
CO2: 20 mmol/L — ABNORMAL LOW (ref 22–32)
Calcium: 9.3 mg/dL (ref 8.9–10.3)
Chloride: 113 mmol/L — ABNORMAL HIGH (ref 98–111)
Creatinine, Ser: 0.89 mg/dL (ref 0.44–1.00)
GFR, Estimated: >60 mL/min (ref >60)
Glucose, Bld: 117 mg/dL — ABNORMAL HIGH (ref 70–99)
Potassium: 3.2 mmol/L — ABNORMAL LOW (ref 3.5–5.1)
Sodium: 141 mmol/L (ref 135–145)
Total Bilirubin: 0.9 mg/dL (ref 0.3–1.2)
Total Protein: 6.4 g/dL — ABNORMAL LOW (ref 6.5–8.1)

## 2021-07-25 LAB — TROPONIN I (HIGH SENSITIVITY): Troponin I (High Sensitivity): 33 ng/L — ABNORMAL HIGH (ref <18)

## 2021-07-25 LAB — BRAIN NATRIURETIC PEPTIDE: B Natriuretic Peptide: 242.1 pg/mL — ABNORMAL HIGH (ref 0.0–100.0)

## 2021-07-25 LAB — TSH: TSH: 1.401 u[IU]/mL (ref 0.350–4.500)

## 2021-07-25 LAB — T4, FREE: Free T4: 1.09 ng/dL (ref 0.61–1.12)

## 2021-07-25 LAB — MAGNESIUM: Magnesium: 1.5 mg/dL — ABNORMAL LOW (ref 1.7–2.4)

## 2021-07-25 IMAGING — DX DG CHEST 1V PORT
1 series · 1 of 1 positions shown · non-contrast
Comparison: [DATE]

CLINICAL DATA: Shortness of breath

EXAM:
PORTABLE CHEST 1 VIEW

[chest ap]
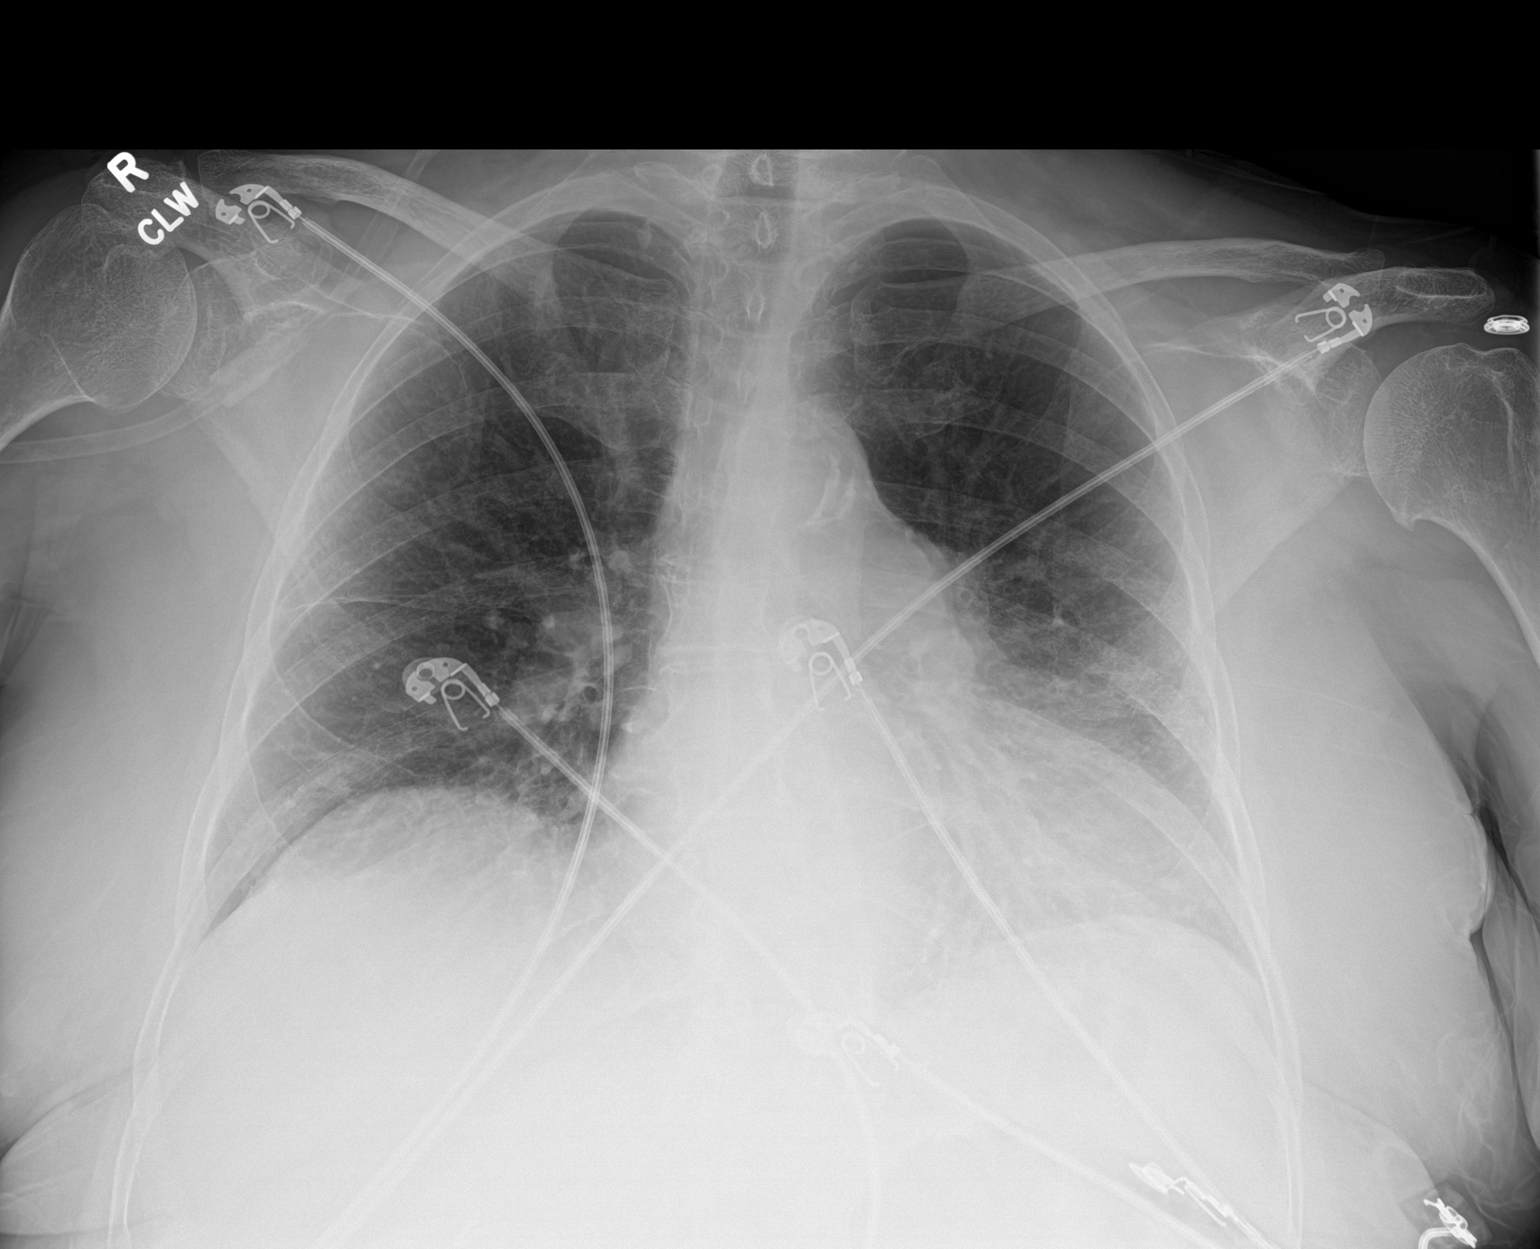

[1 of 1 positions shown; findings below may reference images not displayed]

FINDINGS: Cardiac shadow is enlarged. Aortic calcifications are again noted.
Lungs are well aerated bilaterally. No focal infiltrate or effusion
is seen. No bony abnormality is noted.
IMPRESSION: No acute abnormality noted.

## 2021-07-25 MED ORDER — POTASSIUM CHLORIDE CRYS ER 20 MEQ PO TBCR
40.0000 meq | EXTENDED_RELEASE_TABLET | Freq: Once | ORAL | Status: AC
  Administered 2021-07-25: 40 meq via ORAL
  Filled 2021-07-25: qty 2

## 2021-07-25 MED ORDER — APIXABAN 5 MG PO TABS: 5.0000 mg | ORAL_TABLET | Freq: Two times a day (BID) | ORAL | 0 refills | Status: DC

## 2021-07-25 MED ORDER — APIXABAN 5 MG PO TABS
5.0000 mg | ORAL_TABLET | Freq: Once | ORAL | Status: AC
  Administered 2021-07-25: 5 mg via ORAL
  Filled 2021-07-25: qty 1

## 2021-07-25 MED ORDER — MAGNESIUM SULFATE 2 GM/50ML IV SOLN
2.0000 g | Freq: Once | INTRAVENOUS | Status: AC
  Administered 2021-07-25: 2 g via INTRAVENOUS
  Filled 2021-07-25: qty 50

## 2021-07-25 MED ORDER — METOPROLOL SUCCINATE ER 100 MG PO TB24
100.0000 mg | ORAL_TABLET | Freq: Every day | ORAL | 0 refills | Status: DC
Start: 2021-07-25 — End: 2021-09-17

## 2021-07-25 MED ORDER — POTASSIUM CHLORIDE 10 MEQ/100ML IV SOLN
10.0000 meq | Freq: Once | INTRAVENOUS | Status: AC
Start: 2021-07-25 — End: 2021-07-25
  Administered 2021-07-25: 10 meq via INTRAVENOUS
  Filled 2021-07-25: qty 100

## 2021-07-25 MED ORDER — METOPROLOL SUCCINATE ER 25 MG PO TB24
25.0000 mg | ORAL_TABLET | Freq: Once | ORAL | Status: AC
  Administered 2021-07-25: 25 mg via ORAL
  Filled 2021-07-25: qty 1

## 2021-07-25 NOTE — Telephone Encounter (Signed)
Pt c/o BP issue: STAT if pt c/o blurred vision, one-sided weakness or slurred speech ? ?1. What are your last 5 BP readings? 156/76 ? ?2. Are you having any other symptoms (ex. Dizziness, headache, blurred vision, passed out)? No ? ?3. What is your BP issue? Pt's Nurse states that pt's BP was 156/76 today. Nurse also states that pt's husband states that for the last few days pt's BP has been in the 160's to 190's (Top number) and 70's to 80's ( Bottom number), this is before pt takes morning meds. Nurse would like a call back. Please advise ? ?

## 2021-07-25 NOTE — Telephone Encounter (Signed)
Spoke with Debbie [name], she reports the 156/87 was recently while she was at her home. She will have the patient check her blood pressure about 1 hour after taking her medications and bring that list and her blood pressure cuff to the appointment on 08/03/21. ?

## 2021-07-25 NOTE — ED Provider Notes (Signed)
?[place] ?Provider Note ? ? ?CSN: [id number] ?Arrival date & time: 07/25/21  1805 ? ?  ? ?History ? ?Chief Complaint  ?Patient presents with  ? Shortness of Breath  ? Palpitations  ? Chest Pain  ? ? ?Debbie [name] is a 76 y.o. female. ? ?The history is provided by the patient.  ?Palpitations ?Palpitations quality:  Fast ?Onset quality:  Sudden ?Duration:  2 hours ?Progression:  Resolved ?Chronicity:  New ?Relieved by:  Nothing ?Worsened by:  Nothing ?Associated symptoms: chest pain and shortness of breath   ?Associated symptoms: no back pain, no chest pressure, no cough, no diaphoresis, no dizziness, no hemoptysis, no leg pain, no lower extremity edema, no malaise/fatigue, no nausea, no near-syncope, no numbness, no orthopnea, no PND, no syncope, no vomiting and no weakness   ?Risk factors comment:  CHF, CAF, PAF not on anticoagulation, DM, stroke ? ?  ? ?Home Medications ?Prior to Admission medications   ?Medication Sig Start Date End Date Taking? Authorizing Provider  ?apixaban (ELIQUIS) 5 MG TABS tablet Take 1 tablet (5 mg total) by mouth 2 (two) times daily. 07/25/21 08/24/21 Yes Emett Stapel, DO  ?acetaminophen (TYLENOL) 500 MG tablet Take 2 tablets (1,000 mg total) by mouth every 8 (eight) hours as needed. 03/30/20   Christophe Louis, MD  ?ezetimibe (ZETIA) 10 MG tablet Take 1 tablet by mouth once daily 06/15/21   Troy Sine, MD  ?furosemide (LASIX) 20 MG tablet Take 1 tablet by mouth once daily 06/15/21   Troy Sine, MD  ?gabapentin (NEURONTIN) 300 MG capsule Take 300 mg by mouth in the morning, at noon, and at bedtime. ?Patient not taking: Reported on 07/09/2021 05/06/21   [provider]  ?HYDROcodone-acetaminophen (NORCO/VICODIN) 5-325 MG tablet Take 1 tablet by mouth every 4 (four) hours as needed for moderate pain. 07/05/21 07/05/22  Meyran, Ocie Cornfield, NP  ?metFORMIN (GLUCOPHAGE) 500 MG tablet Take 500 mg by mouth 2 (two) times daily. 03/23/21    [provider]  ?methocarbamol (ROBAXIN) 500 MG tablet Take 1 tablet (500 mg total) by mouth 4 (four) times daily. 07/05/21   Meyran, Ocie Cornfield, NP  ?metoprolol succinate (TOPROL-XL) 100 MG 24 hr tablet Take 1 tablet (100 mg total) by mouth daily. Take with or immediately following a meal. 07/25/21 08/24/21  Lennice Sites, DO  ?Multiple Vitamins-Minerals (CENTRUM SILVER ADULT 50+) TABS Take 1 tablet by mouth daily.    [provider]  ?MYRBETRIQ 50 MG TB24 tablet Take 50 mg by mouth daily. 04/12/21   [provider]  ?nitroGLYCERIN (NITROSTAT) 0.4 MG SL tablet Place 1 tablet (0.4 mg total) under the tongue every 5 (five) minutes as needed for chest pain. 04/05/21 07/09/21  Troy Sine, MD  ?olmesartan (BENICAR) 20 MG tablet Take 1 tablet (20 mg total) by mouth daily. 03/13/21   Troy Sine, MD  ?potassium chloride (KLOR-CON M) 10 MEQ tablet Take 1 tablet (10 mEq total) by mouth 2 (two) times daily for 3 days. 07/10/21 07/13/21  Elgergawy, Silver Huguenin, MD  ?rosuvastatin (CRESTOR) 5 MG tablet Take 5 mg by mouth daily. 03/29/21   [provider]  ?   ? ?Allergies    ?Patient has no known allergies.   ? ?Review of Systems   ?Review of Systems  ?Constitutional:  Negative for diaphoresis and malaise/fatigue.  ?Respiratory:  Positive for shortness of breath. Negative for cough and hemoptysis.   ?Cardiovascular:  Positive for chest pain and palpitations.  Negative for orthopnea, syncope, PND and near-syncope.  ?Gastrointestinal:  Negative for nausea and vomiting.  ?Musculoskeletal:  Negative for back pain.  ?Neurological:  Negative for dizziness, weakness and numbness.  ? ?Physical Exam ?Updated Vital Signs ?BP (!) 167/64   Pulse 63   Temp 98.9 ?F (37.2 ?C) (Oral)   Resp (!) 21   SpO2 96%  ?Physical Exam ?Vitals and nursing note reviewed.  ?Constitutional:   ?   General: She is not in acute distress. ?   Appearance: She is well-developed.  ?HENT:  ?   Head: Normocephalic and  atraumatic.  ?   Mouth/Throat:  ?   Mouth: Mucous membranes are moist.  ?Eyes:  ?   Conjunctiva/sclera: Conjunctivae normal.  ?   Pupils: Pupils are equal, round, and reactive to light.  ?Cardiovascular:  ?   Rate and Rhythm: Normal rate and regular rhythm.  ?   Pulses: Normal pulses.  ?   Heart sounds: Normal heart sounds. No murmur heard. ?Pulmonary:  ?   Effort: Pulmonary effort is normal. No respiratory distress.  ?   Breath sounds: Normal breath sounds. No decreased breath sounds or wheezing.  ?Abdominal:  ?   Palpations: Abdomen is soft.  ?   Tenderness: There is no abdominal tenderness.  ?Musculoskeletal:     ?   General: No swelling.  ?   Cervical back: Normal range of motion and neck supple.  ?   Right lower leg: Edema (trace) present.  ?   Left lower leg: Edema (trace) present.  ?Skin: ?   General: Skin is warm and dry.  ?   Capillary Refill: Capillary refill takes less than 2 seconds.  ?Neurological:  ?   General: No focal deficit present.  ?   Mental Status: She is alert.  ?Psychiatric:     ?   Mood and Affect: Mood normal.  ? ? ?ED Results / Procedures / Treatments   ?Labs ?(all labs ordered are listed, but only abnormal results are displayed) ?Labs Reviewed  ?COMPREHENSIVE METABOLIC PANEL - Abnormal; Notable for the following components:  ?    Result Value  ? Potassium 3.2 (*)   ? Chloride 113 (*)   ? CO2 20 (*)   ? Glucose, Bld 117 (*)   ? Total Protein 6.4 (*)   ? Alkaline Phosphatase 127 (*)   ? All other components within normal limits  ?CBC WITH DIFFERENTIAL/PLATELET - Abnormal; Notable for the following components:  ? RBC 3.66 (*)   ? Hemoglobin 11.2 (*)   ? HCT 35.3 (*)   ? All other components within normal limits  ?BRAIN NATRIURETIC PEPTIDE - Abnormal; Notable for the following components:  ? B Natriuretic Peptide 242.1 (*)   ? All other components within normal limits  ?MAGNESIUM - Abnormal; Notable for the following components:  ? Magnesium 1.5 (*)   ? All other components within normal  limits  ?TROPONIN I (HIGH SENSITIVITY) - Abnormal; Notable for the following components:  ? Troponin I (High Sensitivity) 33 (*)   ? All other components within normal limits  ?TSH  ?T4, FREE  ? ? ?EKG ?EKG Interpretation ? ?Date/Time:  Wednesday July 25 2021 18:18:10 EDT ?Ventricular Rate:  59 ?PR Interval:  131 ?QRS Duration: 93 ?QT Interval:  358 ?QTC Calculation: 355 ?R Axis:   60 ?Text Interpretation: Sinus rhythm Supraventricular bigeminy Confirmed by Lennice Sites [phone]) on 07/25/2021 6:57:01 PM ? ?Radiology ?DG Chest Port 1 View ? ?Result Date: 07/25/2021 ?CLINICAL DATA:  Shortness of breath EXAM: PORTABLE CHEST 1 VIEW COMPARISON:  07/09/2021 FINDINGS: Cardiac shadow is enlarged. Aortic calcifications are again noted. Lungs are well aerated bilaterally. No focal infiltrate or effusion is seen. No bony abnormality is noted. IMPRESSION: No acute abnormality noted. Electronically Signed   By: Inez Catalina M.D.   On: 07/25/2021 19:27   ? ?Procedures ?Procedures  ? ? ?Medications Ordered in ED ?Medications  ?potassium chloride 10 mEq in 100 mL IVPB (10 mEq Intravenous New Bag/Given 07/25/21 2013)  ?potassium chloride SA (KLOR-CON M) CR tablet 40 mEq (40 mEq Oral Given 07/25/21 2004)  ?magnesium sulfate IVPB 2 g 50 mL (2 g Intravenous New Bag/Given 07/25/21 2013)  ?apixaban (ELIQUIS) tablet 5 mg (5 mg Oral Given 07/25/21 2004)  ?metoprolol succinate (TOPROL-XL) 24 hr tablet 25 mg (25 mg Oral Given 07/25/21 2004)  ? ? ?ED Course/ Medical Decision Making/ A&P ?  ?                        ?Medical Decision Making ?Amount and/or Complexity of Data Reviewed ?Labs: ordered. ?Radiology: ordered. ? ?Risk ?Prescription drug management. ? ? ?Leanna Sato is here with palpitations, shortness of breath, chest pain.  Symptoms now resolved.  Normal vitals.  No fever.  States that about an hour prior to arrival she started to have palpitations and some chest pain or shortness of breath.  She got 10 mg IV diltiazem with EMS for what  looked to be atrial fibrillation with RVR and now EKG appears to show per my review and interpretation sinus rhythm.  Heart rates in the 60s.  She is asymptomatic.  History of paroxysmal A-fib per chart review but not

## 2021-07-25 NOTE — Discharge Instructions (Addendum)
For your atrial fibrillation you will stop your daily aspirin and replace it with Eliquis 5 mg twice a day.  We will also increase your metoprolol from 50 mg to 100 mg once a day.  Follow-up in atrial fibrillation clinic. ? ?Eliquis is a blood thinner, if you suffer head trauma or other significant trauma please be evaluated by physician. ? ? ?Information on my medicine - ELIQUIS? (apixaban) ? ?This medication education was reviewed with me or my healthcare representative as part of my discharge preparation.  The pharmacist that spoke with me during my hospital stay was:  Lavenia Atlas, [facility] ? ?Why was Eliquis? prescribed for you? ?Eliquis? was prescribed for you to reduce the risk of a blood clot forming that can cause a stroke if you have a medical condition called atrial fibrillation (a type of irregular heartbeat). ? ?What do You need to know about Eliquis? ? ?Take your Eliquis? TWICE DAILY - one tablet in the morning and one tablet in the evening with or without food. If you have difficulty swallowing the tablet whole please discuss with your pharmacist how to take the medication safely. ? ?Take Eliquis? exactly as prescribed by your doctor and DO NOT stop taking Eliquis? without talking to the doctor who prescribed the medication.  Stopping may increase your risk of developing a stroke.  Refill your prescription before you run out. ? ?After discharge, you should have regular check-up appointments with your healthcare provider that is prescribing your Eliquis?.  In the future your dose may need to be changed if your kidney function or weight changes by a significant amount or as you get older. ? ?What do you do if you miss a dose? ?If you miss a dose, take it as soon as you remember on the same day and resume taking twice daily.  Do not take more than one dose of ELIQUIS at the same time to make up a missed dose. ? ?Important Safety Information ?A possible side effect of Eliquis? is bleeding. You should  call your healthcare provider right away if you experience any of the following: ?Bleeding from an injury or your nose that does not stop. ?Unusual colored urine (red or dark brown) or unusual colored stools (red or black). ?Unusual bruising for unknown reasons. ?A serious fall or if you hit your head (even if there is no bleeding). ? ?Some medicines may interact with Eliquis? and might increase your risk of bleeding or clotting while on Eliquis?Marland Kitchen To help avoid this, consult your healthcare provider or pharmacist prior to using any new prescription or non-prescription medications, including herbals, vitamins, non-steroidal anti-inflammatory drugs (NSAIDs) and supplements. ? ?This website has more information on Eliquis? (apixaban): http://www.eliquis.com/eliquis/home  ?

## 2021-07-25 NOTE — ED Triage Notes (Signed)
Pt BIB [facility] from home. EMS was initially dispatched to pt's house for CP. Pt was recently diagnosed with CHF. Pt's EKG Afib RVR w EMS, '10mg'$  cardzem given by EMS. A&O X4. VSS ?

## 2021-07-25 NOTE — Progress Notes (Signed)
Discussed with ED physician.   ?Patient brought in by EMS for atrial fibrillation with rapid ventricular response. ?She has now converted to sinus rhythm and is essentially asymptomatic.  ECG now shows very frequent PACs, sometimes in a pattern of atrial bigeminy. ?She does not have chest pain and no longer has any complaints of dyspnea. ?Recently admitted for an episode of congestive heart failure and found to have normal left ventricular systolic function and no evidence of major valvular abnormalities on echocardiography.  Note potassium of 2.9 about 2 weeks ago, has not yet been rechecked.  Had normal renal function.  Current Labs are pending at this time. ?Recommend increasing her dose of metoprolol succinate to 100 mg daily for better rate control during breakthrough atrial fibrillation. ?She has a history of previous stroke and CHA2DS2-VASc score of 60 (age 76, stroke 2, CAD, DM, hypertension, CHF, female gender).  Recommend stopping aspirin and starting Eliquis 5 mg twice daily. ?We will make arrangements for follow-up in the A-fib clinic. ?Also needs follow-up in cardiology clinic for history of CAD with previous PCI and recent episode of diastolic heart failure. ?

## 2021-07-26 ENCOUNTER — Telehealth: Payer: Self-pay | Admitting: Cardiovascular Disease

## 2021-07-26 NOTE — Telephone Encounter (Signed)
Returned call to pt she states that she was in the ER for SOB and chest pain. She states that she feels fine now, she thinks that Alvis Lemmings was calling because of the ER visit last night. Nothing is needed at this time.  ? ?Fortino Sic she states that pt's weight is up 3# today and 4# in the last week. She thinks that pt needs to take an additional lasix. Will call pt to discuss. ? ?Called pt again she states that she is not SOB today and is going to take additional lasix Friday (07-27-21) and Saturday(07-28-21) she will call back on Monday if this does not help, with weights and BP/HR. Her Creatinine yesterday was 0.94 at the ER visit. She has a follow a[ppt here next week with Isaac Laud. Additionally, she will go to the ER if needed again. Verbalized understanding. ?

## 2021-07-26 NOTE — Telephone Encounter (Signed)
New Message: ? ? ? ? ?Beth from [facility] called. She would like for you to please give her a call. She wants to give you an update on the patient. ?

## 2021-07-29 ENCOUNTER — Emergency Department (HOSPITAL_COMMUNITY): Payer: Medicare PPO

## 2021-07-29 ENCOUNTER — Inpatient Hospital Stay (HOSPITAL_COMMUNITY)
Admission: EM | Admit: 2021-07-29 | Discharge: 2021-08-01 | DRG: 247 | Disposition: A | Discharge status: Home Health | Payer: Medicare PPO | Attending: Internal Medicine | Admitting: Internal Medicine

## 2021-07-29 ENCOUNTER — Encounter (HOSPITAL_COMMUNITY): Payer: Self-pay

## 2021-07-29 ENCOUNTER — Other Ambulatory Visit: Payer: Self-pay

## 2021-07-29 DIAGNOSIS — E7800 Pure hypercholesterolemia, unspecified: Secondary | ICD-10-CM | POA: Diagnosis present

## 2021-07-29 DIAGNOSIS — Z955 Presence of coronary angioplasty implant and graft: Secondary | ICD-10-CM

## 2021-07-29 DIAGNOSIS — I480 Paroxysmal atrial fibrillation: Secondary | ICD-10-CM | POA: Diagnosis present

## 2021-07-29 DIAGNOSIS — Z7984 Long term (current) use of oral hypoglycemic drugs: Secondary | ICD-10-CM

## 2021-07-29 DIAGNOSIS — M1990 Unspecified osteoarthritis, unspecified site: Secondary | ICD-10-CM | POA: Diagnosis present

## 2021-07-29 DIAGNOSIS — I25110 Atherosclerotic heart disease of native coronary artery with unstable angina pectoris: Secondary | ICD-10-CM | POA: Diagnosis not present

## 2021-07-29 DIAGNOSIS — E876 Hypokalemia: Secondary | ICD-10-CM | POA: Diagnosis present

## 2021-07-29 DIAGNOSIS — Z79899 Other long term (current) drug therapy: Secondary | ICD-10-CM

## 2021-07-29 DIAGNOSIS — Z833 Family history of diabetes mellitus: Secondary | ICD-10-CM

## 2021-07-29 DIAGNOSIS — Z8673 Personal history of transient ischemic attack (TIA), and cerebral infarction without residual deficits: Secondary | ICD-10-CM

## 2021-07-29 DIAGNOSIS — I5032 Chronic diastolic (congestive) heart failure: Secondary | ICD-10-CM | POA: Diagnosis present

## 2021-07-29 DIAGNOSIS — Z981 Arthrodesis status: Secondary | ICD-10-CM

## 2021-07-29 DIAGNOSIS — I252 Old myocardial infarction: Secondary | ICD-10-CM

## 2021-07-29 DIAGNOSIS — Z90710 Acquired absence of both cervix and uterus: Secondary | ICD-10-CM

## 2021-07-29 DIAGNOSIS — R079 Chest pain, unspecified: Secondary | ICD-10-CM | POA: Diagnosis not present

## 2021-07-29 DIAGNOSIS — I2510 Atherosclerotic heart disease of native coronary artery without angina pectoris: Secondary | ICD-10-CM | POA: Diagnosis present

## 2021-07-29 DIAGNOSIS — I495 Sick sinus syndrome: Secondary | ICD-10-CM | POA: Diagnosis present

## 2021-07-29 DIAGNOSIS — H269 Unspecified cataract: Secondary | ICD-10-CM | POA: Diagnosis present

## 2021-07-29 DIAGNOSIS — R0789 Other chest pain: Secondary | ICD-10-CM | POA: Diagnosis not present

## 2021-07-29 DIAGNOSIS — I200 Unstable angina: Secondary | ICD-10-CM | POA: Diagnosis present

## 2021-07-29 DIAGNOSIS — Z8249 Family history of ischemic heart disease and other diseases of the circulatory system: Secondary | ICD-10-CM

## 2021-07-29 DIAGNOSIS — I110 Hypertensive heart disease with heart failure: Secondary | ICD-10-CM | POA: Diagnosis present

## 2021-07-29 DIAGNOSIS — I10 Essential (primary) hypertension: Secondary | ICD-10-CM | POA: Diagnosis present

## 2021-07-29 DIAGNOSIS — E119 Type 2 diabetes mellitus without complications: Secondary | ICD-10-CM

## 2021-07-29 DIAGNOSIS — D649 Anemia, unspecified: Secondary | ICD-10-CM | POA: Diagnosis present

## 2021-07-29 DIAGNOSIS — Z7901 Long term (current) use of anticoagulants: Secondary | ICD-10-CM

## 2021-07-29 LAB — LIPASE, BLOOD: Lipase: 38 U/L (ref 11–51)

## 2021-07-29 LAB — BASIC METABOLIC PANEL
Anion gap: 8 (ref 5–15)
BUN: 13 mg/dL (ref 8–23)
CO2: 20 mmol/L — ABNORMAL LOW (ref 22–32)
Calcium: 9.6 mg/dL (ref 8.9–10.3)
Chloride: 114 mmol/L — ABNORMAL HIGH (ref 98–111)
Creatinine, Ser: 0.97 mg/dL (ref 0.44–1.00)
GFR, Estimated: >60 mL/min (ref >60)
Glucose, Bld: 154 mg/dL — ABNORMAL HIGH (ref 70–99)
Potassium: 4 mmol/L (ref 3.5–5.1)
Sodium: 142 mmol/L (ref 135–145)

## 2021-07-29 LAB — CK: Total CK: 56 U/L (ref 38–234)

## 2021-07-29 LAB — CBC
HCT: 36.6 % (ref 36.0–46.0)
Hemoglobin: 11.7 g/dL — ABNORMAL LOW (ref 12.0–15.0)
MCH: 30.2 pg (ref 26.0–34.0)
MCHC: 32 g/dL (ref 30.0–36.0)
MCV: 94.6 fL (ref 80.0–100.0)
Platelets: 344 10*3/uL (ref 150–400)
RBC: 3.87 MIL/uL (ref 3.87–5.11)
RDW: 15 % (ref 11.5–15.5)
WBC: 8.3 10*3/uL (ref 4.0–10.5)
nRBC: 0 % (ref 0.0–0.2)

## 2021-07-29 LAB — CBG MONITORING, ED: Glucose-Capillary: 141 mg/dL — ABNORMAL HIGH (ref 70–99)

## 2021-07-29 LAB — HEPATIC FUNCTION PANEL
ALT: 22 U/L (ref 0–44)
AST: 21 U/L (ref 15–41)
Albumin: 3.8 g/dL (ref 3.5–5.0)
Alkaline Phosphatase: 126 U/L (ref 38–126)
Bilirubin, Direct: 0.1 mg/dL (ref 0.0–0.2)
Indirect Bilirubin: 1 mg/dL — ABNORMAL HIGH (ref 0.3–0.9)
Total Bilirubin: 1.1 mg/dL (ref 0.3–1.2)
Total Protein: 6.7 g/dL (ref 6.5–8.1)

## 2021-07-29 LAB — TROPONIN I (HIGH SENSITIVITY)
Troponin I (High Sensitivity): 37 ng/L — ABNORMAL HIGH (ref <18)
Troponin I (High Sensitivity): 35 ng/L — ABNORMAL HIGH (ref <18)
Troponin I (High Sensitivity): 33 ng/L — ABNORMAL HIGH (ref <18)

## 2021-07-29 LAB — MAGNESIUM: Magnesium: 1.6 mg/dL — ABNORMAL LOW (ref 1.7–2.4)

## 2021-07-29 IMAGING — CR DG CHEST 2V
2 series · 2 of 2 positions shown · non-contrast
Comparison: Chest x-ray [DATE].

CLINICAL DATA: 76-year-old female with history of chest pain.

EXAM:
CHEST - 2 VIEW

[chest lat]
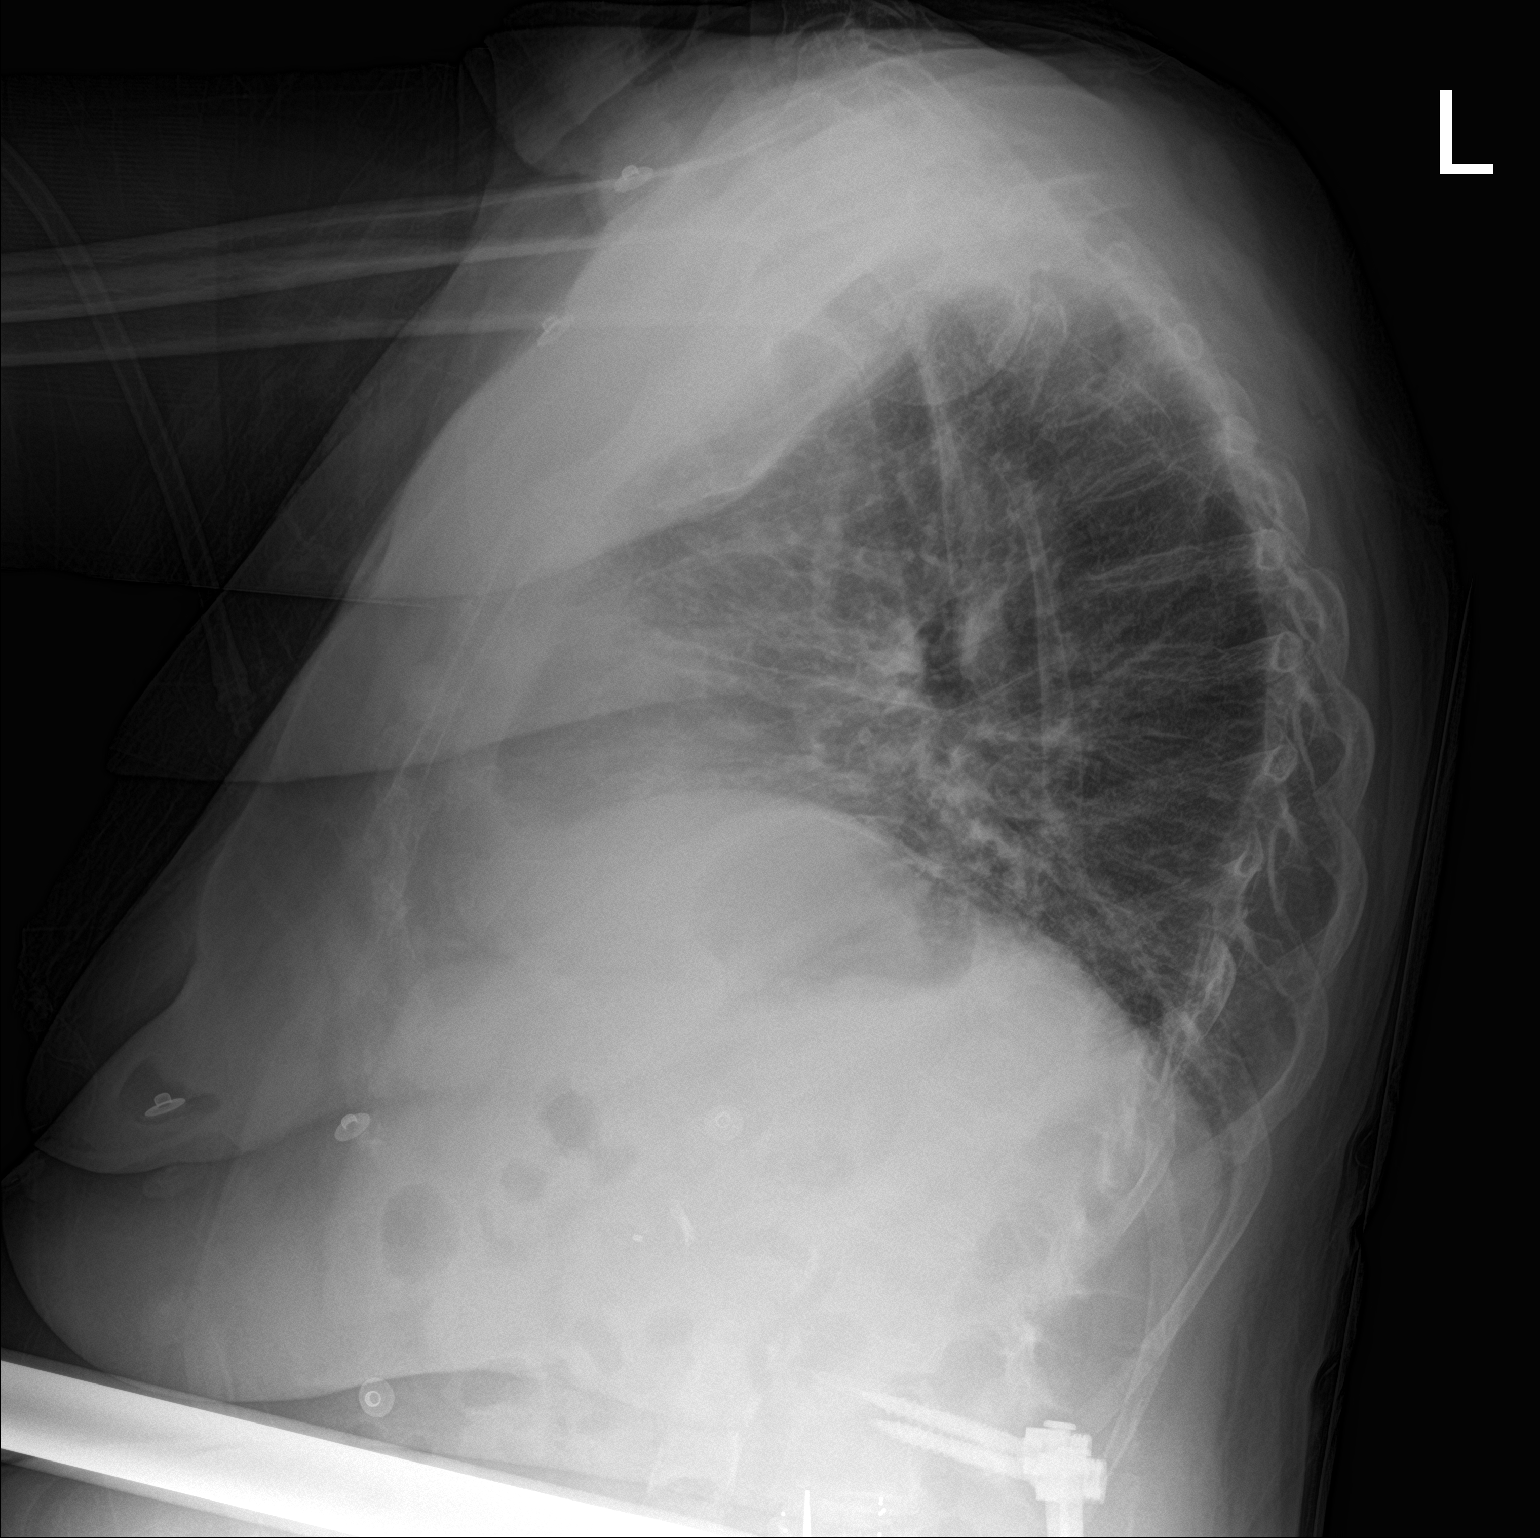

[chest ap]
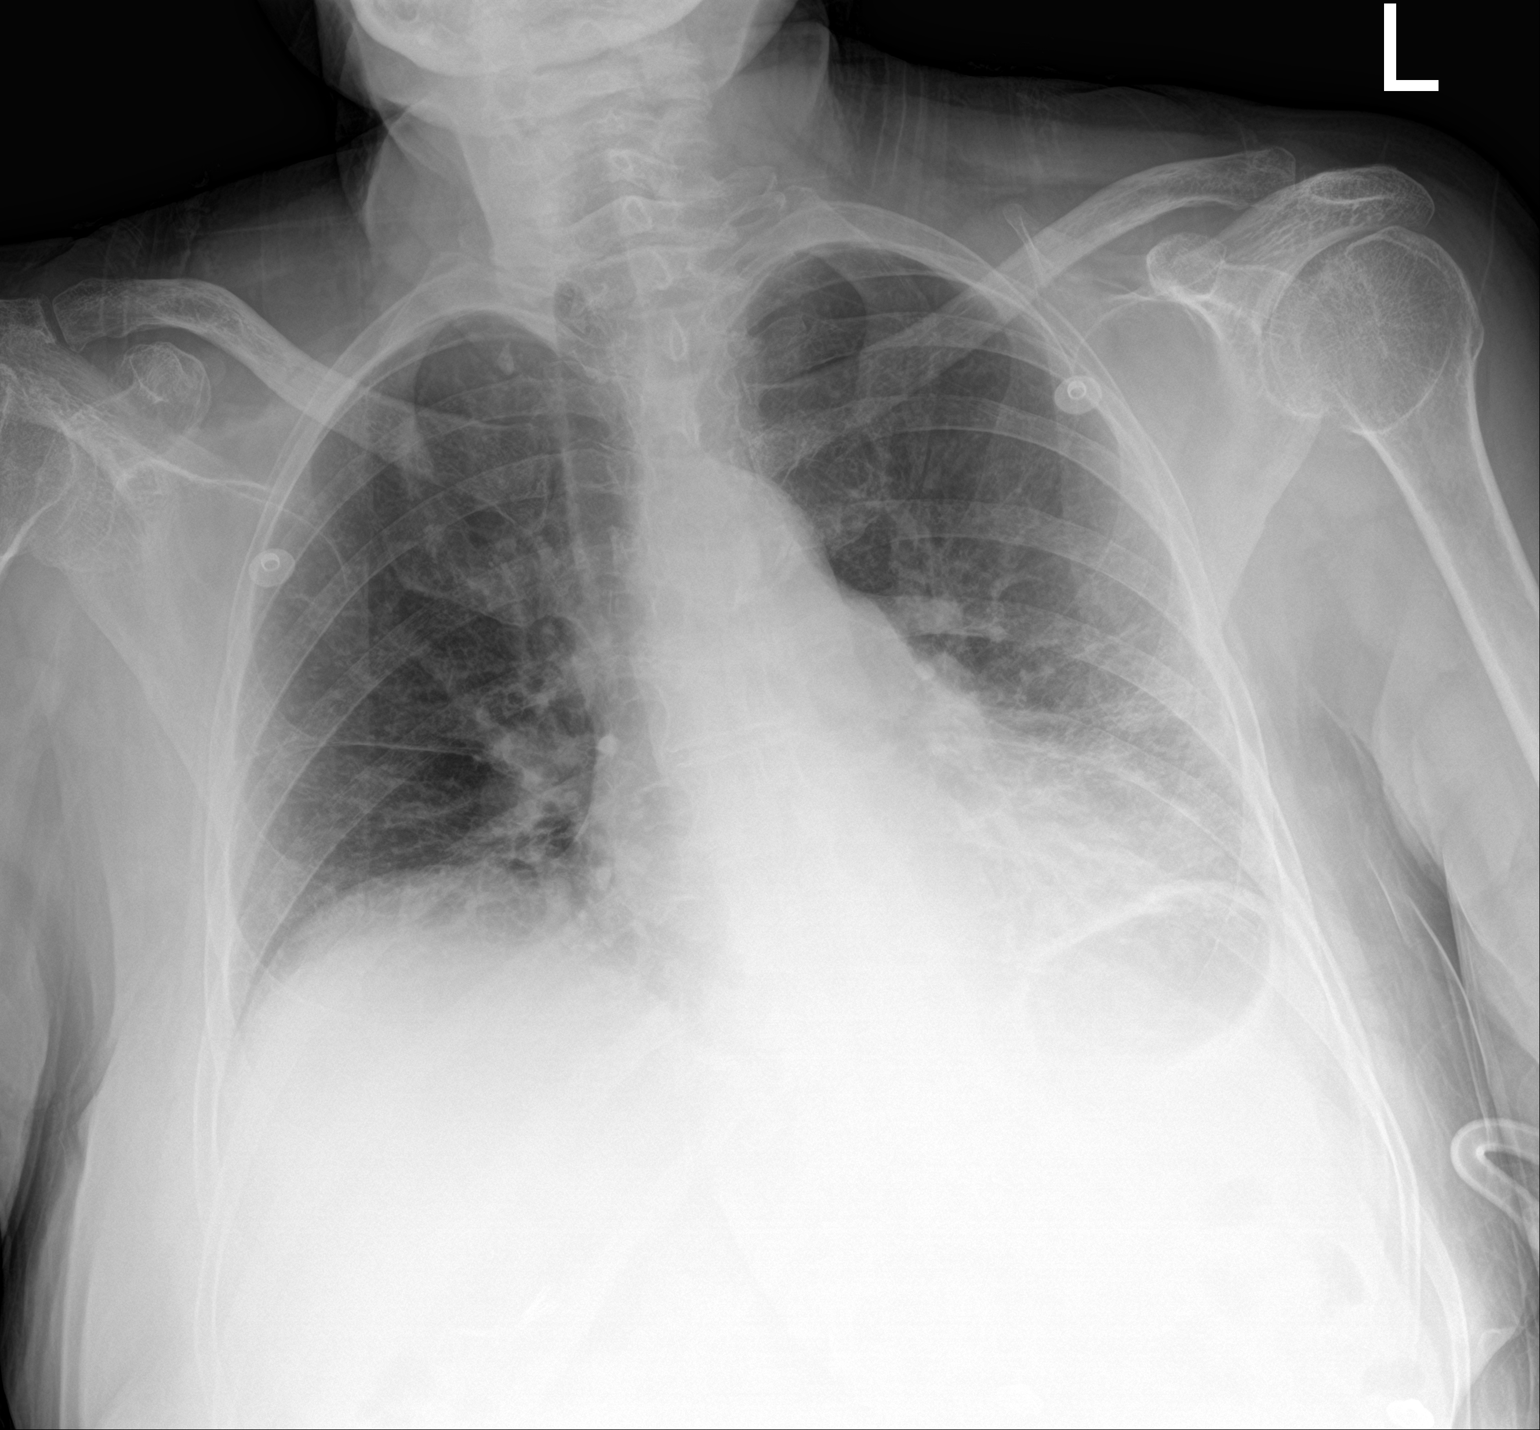

[2 of 2 positions shown; findings below may reference images not displayed]

FINDINGS: Lung volumes are low. Areas of interstitial prominence and
peribronchial cuffing are noted throughout the mid to lower lungs
bilaterally (left-greater-than-right). No confluent consolidative
airspace disease. No pleural effusions. No pneumothorax. Small
calcified granuloma in the apex of the right upper lobe. No other
definite suspicious appearing pulmonary nodules or masses are noted.
No evidence of pulmonary edema. Heart size is normal. Upper
mediastinal contours are within normal limits. Atherosclerotic
calcifications in the thoracic aorta.
IMPRESSION: 1. Patchy areas of interstitial prominence and peribronchial
cuffing, most evident in the left mid to lower lung, concerning for
bronchitis and potential developing bronchopneumonia.
2. Aortic atherosclerosis.

## 2021-07-29 IMAGING — CT CT ANGIO CHEST
2 of 6 series · 18 of 36 positions shown · IV contrast (agent unspecified)
Comparison: [DATE].

CLINICAL DATA: Chest pain beginning early this morning.

EXAM:
CT ANGIOGRAPHY CHEST WITH CONTRAST
TECHNIQUE: Multidetector CT imaging of the chest was performed using the
standard protocol during bolus administration of intravenous
contrast. Multiplanar CT image reconstructions and MIPs were
obtained to evaluate the vascular anatomy.

[Series 8: pe thins · axial · 0.66mm/px · z∈[+1073,+1295]mm · 17 of 353 slices shown]
[im 18/353  lung]
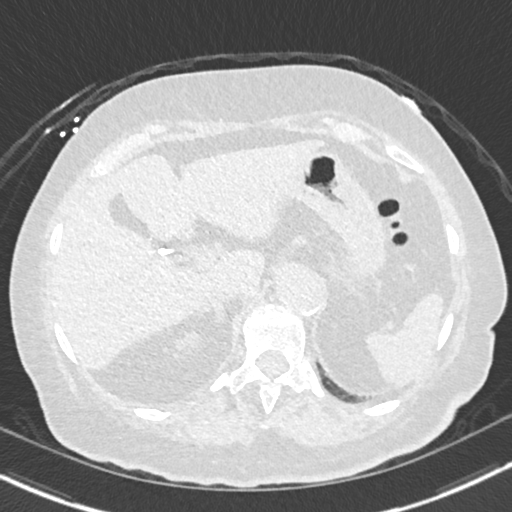
[im 36/353  mediastinal]
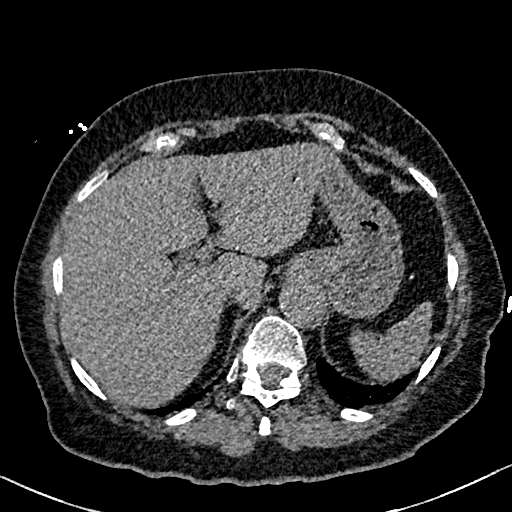
[im 53/353  lung]
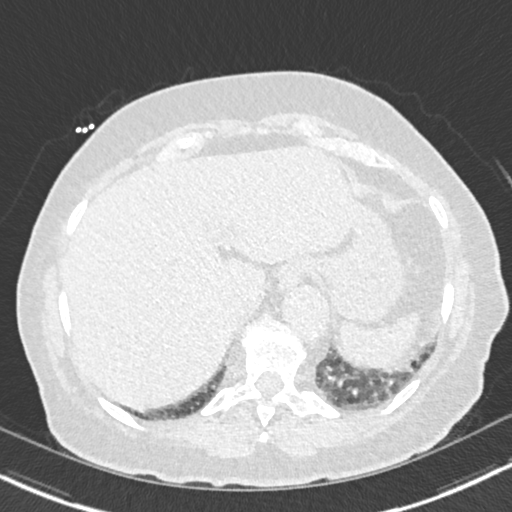
[im 71/353  mediastinal]
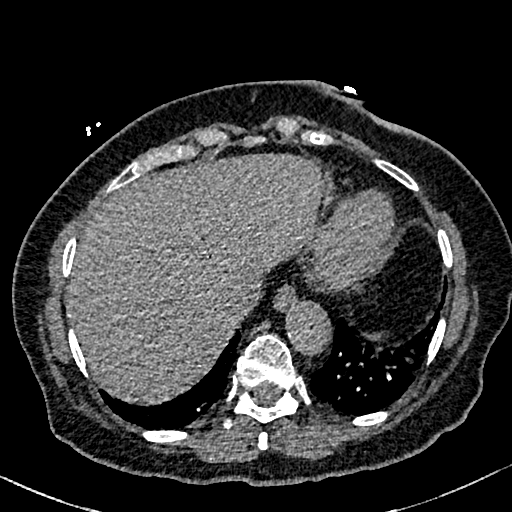
[im 106/353  lung]
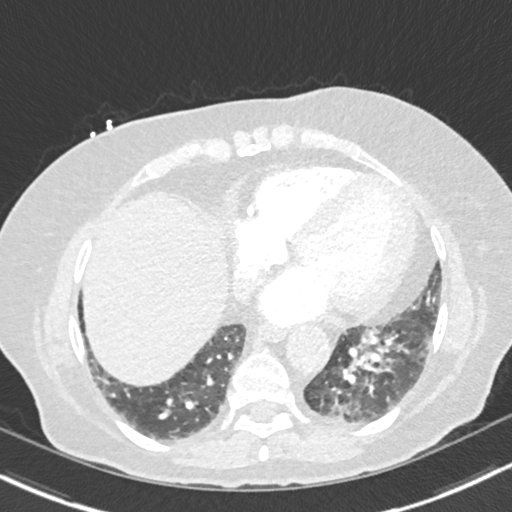
[im 124/353  mediastinal]
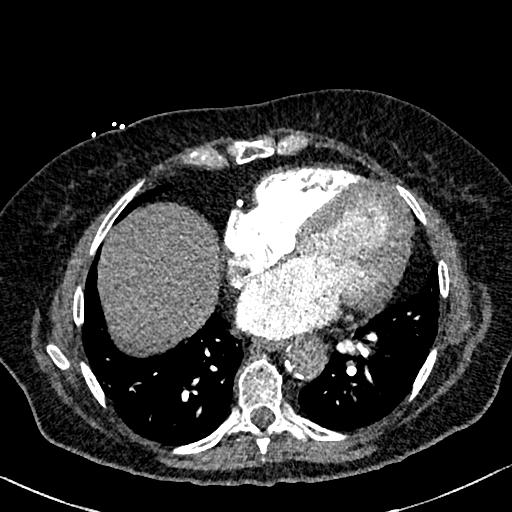
[im 141/353  lung]
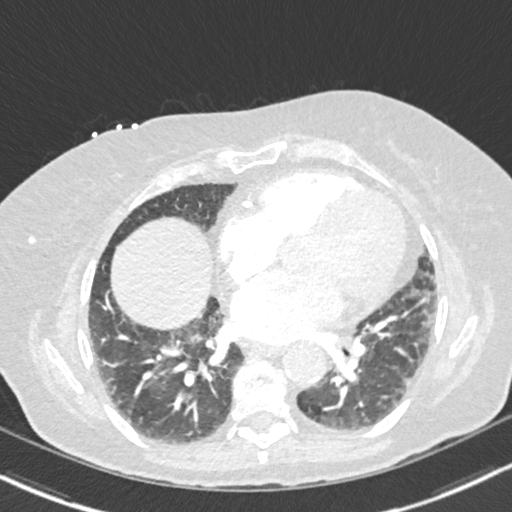
[im 159/353  mediastinal]
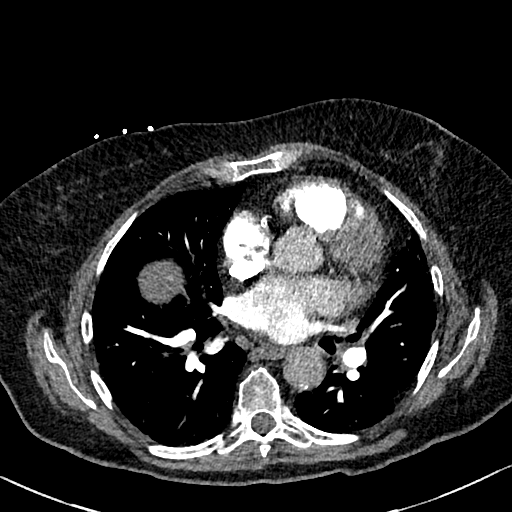
[im 177/353  lung]
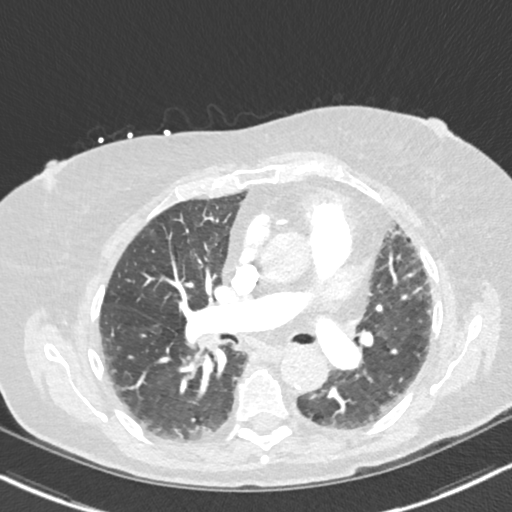
[im 194/353  mediastinal]
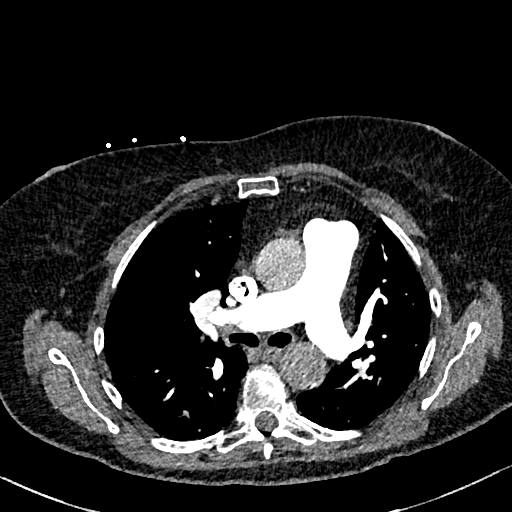
[im 212/353  lung]
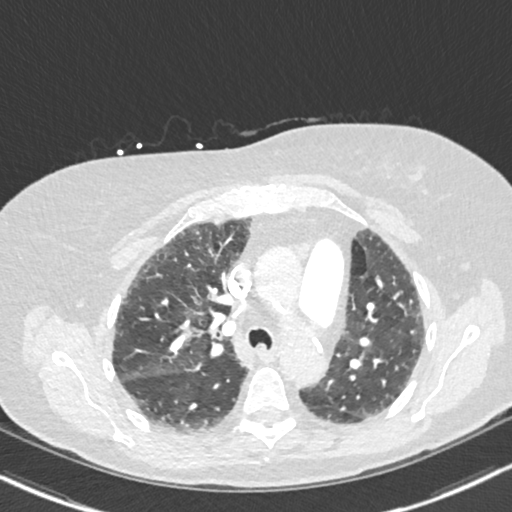
[im 229/353  mediastinal]
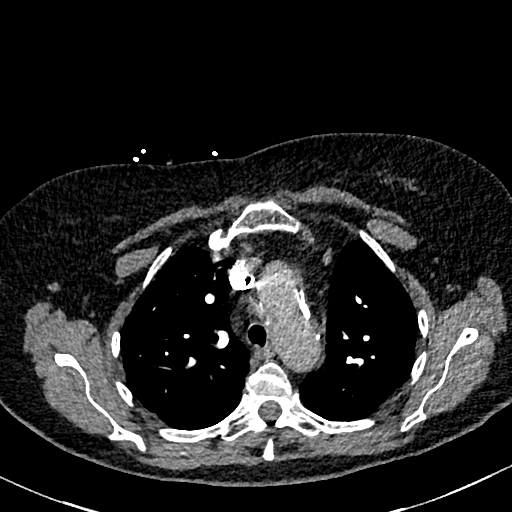
[im 247/353  lung]
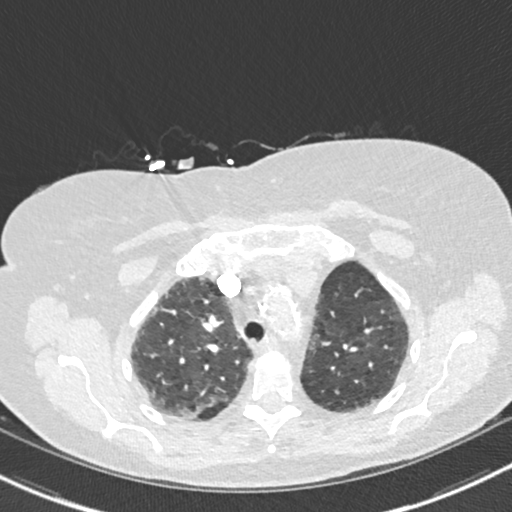
[im 282/353  mediastinal]
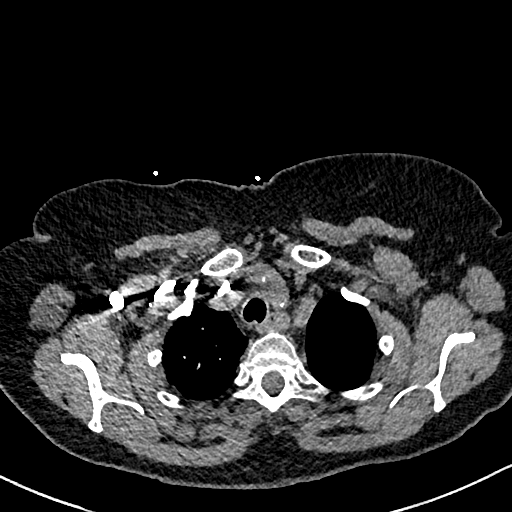
[im 300/353  lung]
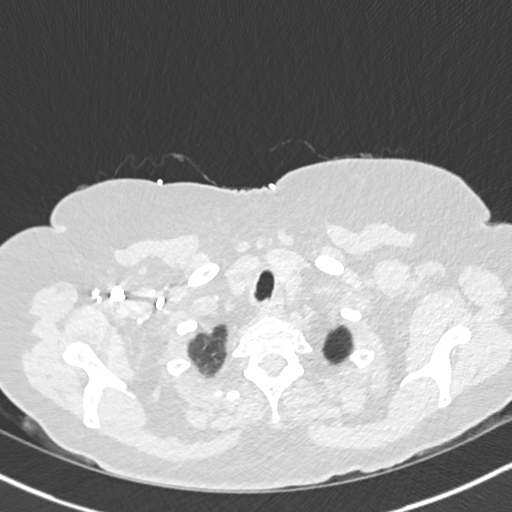
[im 317/353  mediastinal]
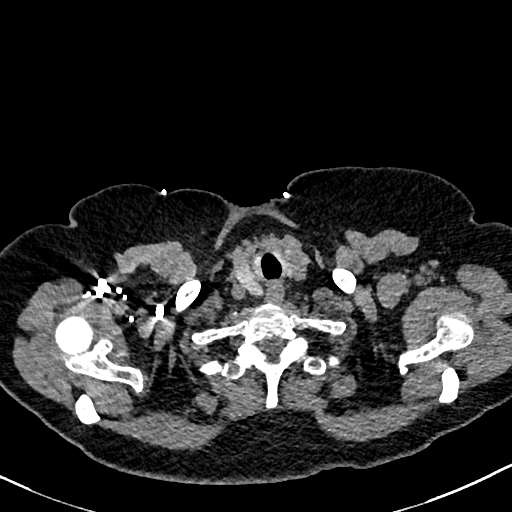
[im 335/353  lung]
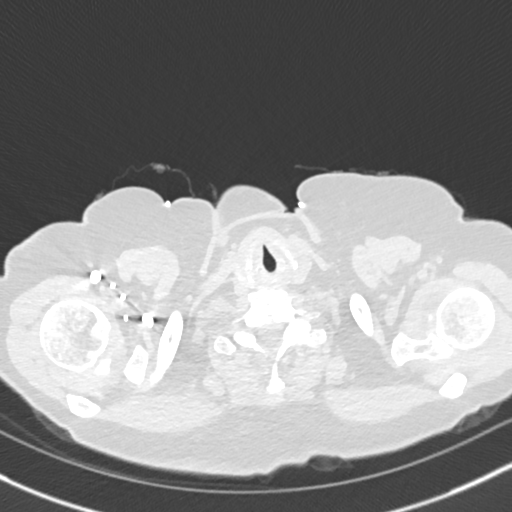

[Series 9: pe 2mm cor · coronal · 0.59mm/px · 1 of 151 slices shown]
[im 76/151  mediastinal]
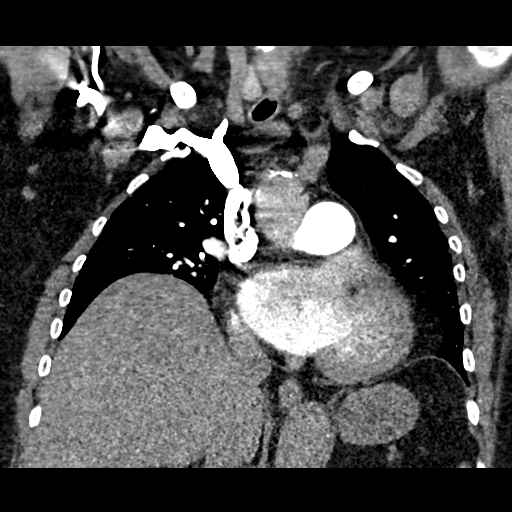

[18 of 36 positions shown; findings below may reference images not displayed]

RADIATION DOSE REDUCTION: This exam was performed according to the
departmental dose-optimization program which includes automated
exposure control, adjustment of the mA and/or kV according to
patient size and/or use of iterative reconstruction technique.

CONTRAST:  100mL OMNIPAQUE IOHEXOL 350 MG/ML SOLN
FINDINGS: Cardiovascular: Pulmonary arteries are well opacified. There is no
evidence of a pulmonary embolism.

Heart is mildly enlarged. No pericardial effusion. Right and left
coronary artery calcifications. Great vessels are normal caliber.
Aortic atherosclerosis.

Mediastinum/Nodes: No neck base, mediastinal or hilar masses or
enlarged lymph nodes. Trachea and esophagus are unremarkable.

Lungs/Pleura: Vascular prominence bilaterally. Linear and patchy
opacities in the lower lobes consistent with atelectasis. Bilateral
areas of peripheral interstitial thickening. These findings are
similar to the prior chest CTA. No convincing pneumonia or pulmonary
edema. No pleural effusion or pneumothorax. Mild centrilobular
emphysema.

Upper Abdomen: No acute abnormality.

Musculoskeletal: No fracture or acute finding. No bone lesion. No
chest wall mass.

Review of the MIP images confirms the above findings.
IMPRESSION: 1. No evidence of a pulmonary embolism.
2. No acute findings.
3. Chronic lung findings including peripheral interstitial
thickening with some reticulation suggesting interstitial fibrosis.
Mild basilar atelectasis and/or scarring. Mild emphysema.

Aortic Atherosclerosis ([NQ]-[NQ]) and Emphysema ([NQ]-[NQ]).

## 2021-07-29 MED ORDER — ADULT MULTIVITAMIN W/MINERALS CH
1.0000 | ORAL_TABLET | Freq: Every day | ORAL | Status: DC
  Administered 2021-07-30 – 2021-08-01 (×3): 1 via ORAL
  Filled 2021-07-29 (×3): qty 1

## 2021-07-29 MED ORDER — MIRABEGRON ER 50 MG PO TB24
50.0000 mg | ORAL_TABLET | Freq: Every day | ORAL | Status: DC
Start: 2021-07-29 — End: 2021-08-01
  Administered 2021-07-30 – 2021-08-01 (×3): 50 mg via ORAL
  Filled 2021-07-29 (×4): qty 1

## 2021-07-29 MED ORDER — ACETAMINOPHEN 325 MG PO TABS
650.0000 mg | ORAL_TABLET | ORAL | Status: DC | PRN
  Administered 2021-07-31: 650 mg via ORAL
  Filled 2021-07-29: qty 2

## 2021-07-29 MED ORDER — GABAPENTIN 300 MG PO CAPS
300.0000 mg | ORAL_CAPSULE | Freq: Once | ORAL | Status: AC
  Administered 2021-07-29: 300 mg via ORAL
  Filled 2021-07-29: qty 1

## 2021-07-29 MED ORDER — MAGNESIUM SULFATE 2 GM/50ML IV SOLN
2.0000 g | Freq: Once | INTRAVENOUS | Status: AC
  Administered 2021-07-29: 2 g via INTRAVENOUS
  Filled 2021-07-29: qty 50

## 2021-07-29 MED ORDER — APIXABAN 5 MG PO TABS
5.0000 mg | ORAL_TABLET | Freq: Once | ORAL | Status: AC
  Administered 2021-07-29: 5 mg via ORAL
  Filled 2021-07-29: qty 1

## 2021-07-29 MED ORDER — METOPROLOL SUCCINATE ER 25 MG PO TB24
100.0000 mg | ORAL_TABLET | Freq: Once | ORAL | Status: AC
  Administered 2021-07-29: 100 mg via ORAL
  Filled 2021-07-29: qty 4

## 2021-07-29 MED ORDER — IRBESARTAN 150 MG PO TABS
150.0000 mg | ORAL_TABLET | Freq: Every day | ORAL | Status: DC
  Administered 2021-07-30 – 2021-08-01 (×4): 150 mg via ORAL
  Filled 2021-07-29 (×5): qty 1

## 2021-07-29 MED ORDER — GABAPENTIN 300 MG PO CAPS
300.0000 mg | ORAL_CAPSULE | Freq: Two times a day (BID) | ORAL | Status: DC
  Administered 2021-07-29 – 2021-08-01 (×6): 300 mg via ORAL
  Filled 2021-07-29 (×6): qty 1

## 2021-07-29 MED ORDER — METOPROLOL SUCCINATE ER 50 MG PO TB24: 50.0000 mg | ORAL_TABLET | Freq: Once | ORAL | Status: DC

## 2021-07-29 MED ORDER — MORPHINE SULFATE (PF) 2 MG/ML IV SOLN
2.0000 mg | Freq: Once | INTRAVENOUS | Status: AC
  Administered 2021-07-29: 2 mg via INTRAVENOUS
  Filled 2021-07-29: qty 1

## 2021-07-29 MED ORDER — NITROGLYCERIN 2 % TD OINT
1.0000 [in_us] | TOPICAL_OINTMENT | Freq: Once | TRANSDERMAL | Status: AC
  Administered 2021-07-29: 1 [in_us] via TOPICAL
  Filled 2021-07-29: qty 1

## 2021-07-29 MED ORDER — ONDANSETRON HCL 4 MG/2ML IJ SOLN
4.0000 mg | Freq: Four times a day (QID) | INTRAMUSCULAR | Status: DC | PRN
  Administered 2021-07-31: 4 mg via INTRAVENOUS
  Filled 2021-07-29: qty 2

## 2021-07-29 MED ORDER — METFORMIN HCL 500 MG PO TABS
500.0000 mg | ORAL_TABLET | Freq: Once | ORAL | Status: AC
  Administered 2021-07-29: 500 mg via ORAL
  Filled 2021-07-29: qty 1

## 2021-07-29 MED ORDER — ROSUVASTATIN CALCIUM 5 MG PO TABS
5.0000 mg | ORAL_TABLET | Freq: Every day | ORAL | Status: DC
Start: 2021-07-29 — End: 2021-08-01
  Administered 2021-07-29 – 2021-08-01 (×4): 5 mg via ORAL
  Filled 2021-07-29 (×4): qty 1

## 2021-07-29 MED ORDER — EZETIMIBE 10 MG PO TABS
10.0000 mg | ORAL_TABLET | Freq: Every day | ORAL | Status: DC
  Administered 2021-07-29 – 2021-08-01 (×4): 10 mg via ORAL
  Filled 2021-07-29 (×4): qty 1

## 2021-07-29 MED ORDER — IOHEXOL 350 MG/ML SOLN
100.0000 mL | Freq: Once | INTRAVENOUS | Status: AC | PRN
  Administered 2021-07-29: 100 mL via INTRAVENOUS

## 2021-07-29 MED ORDER — METHOCARBAMOL 500 MG PO TABS
500.0000 mg | ORAL_TABLET | Freq: Four times a day (QID) | ORAL | Status: DC | PRN
  Administered 2021-07-30: 500 mg via ORAL
  Filled 2021-07-29: qty 1

## 2021-07-29 MED ORDER — INSULIN ASPART 100 UNIT/ML IJ SOLN
0.0000 [IU] | Freq: Three times a day (TID) | INTRAMUSCULAR | Status: DC
  Administered 2021-07-30 – 2021-07-31 (×2): 2 [IU] via SUBCUTANEOUS

## 2021-07-29 MED ORDER — HYDROCODONE-ACETAMINOPHEN 5-325 MG PO TABS
1.0000 | ORAL_TABLET | ORAL | Status: DC | PRN
  Administered 2021-07-31 – 2021-08-01 (×3): 1 via ORAL
  Filled 2021-07-29 (×3): qty 1

## 2021-07-29 MED ORDER — APIXABAN 5 MG PO TABS
5.0000 mg | ORAL_TABLET | Freq: Two times a day (BID) | ORAL | Status: DC
  Administered 2021-07-29: 5 mg via ORAL
  Filled 2021-07-29: qty 1

## 2021-07-29 MED ORDER — IRBESARTAN 150 MG PO TABS
150.0000 mg | ORAL_TABLET | Freq: Once | ORAL | Status: AC
  Administered 2021-07-29: 150 mg via ORAL
  Filled 2021-07-29 (×2): qty 1

## 2021-07-29 NOTE — Assessment & Plan Note (Signed)
Had right-sided deficits after her stroke which has since resolved. ?

## 2021-07-29 NOTE — Assessment & Plan Note (Signed)
Continue with ARB.  Monitor blood pressures closely. ?

## 2021-07-29 NOTE — Assessment & Plan Note (Signed)
HbA1c 7.1 in March.  Continue with SSI alone.  Holding metformin. ?

## 2021-07-29 NOTE — ED Notes (Signed)
Informed Dr. Doren Custard pt converted to Afib. Pt with h/o Afib per husband.  ?

## 2021-07-29 NOTE — Assessment & Plan Note (Signed)
Recently diagnosed on April 12.  Was started on apixaban at that time which will be continued.  Seems to be going in and out of atrial fibrillation.  Continue with beta-blocker.  TSH was normal.  Recent echocardiogram reviewed.  Noted to have normal systolic function.  Monitor on telemetry.  Further management per cardiology. ?

## 2021-07-29 NOTE — Assessment & Plan Note (Signed)
Known history of coronary artery disease with stent placements previously.  Followed by Dr. Claiborne Billings.  Was on aspirin previously.  Switched over to apixaban once she was diagnosed with atrial fibrillation on April 12.  Continue with statin treatment.  Continue with her beta-blocker.  See above.  Repeat EKG. ?

## 2021-07-29 NOTE — ED Notes (Signed)
Verbal order from Dr. Maryland Pink to dc Troponin q2h lab received. ?

## 2021-07-29 NOTE — Assessment & Plan Note (Signed)
Troponins noted to be only minimally elevated.  Symptoms most likely secondary to atrial fibrillation.  Symptoms resolved after she took 2 nitroglycerin and after her heart rate converted back to sinus rhythm. ?CT angiogram did not show any PE or any other acute findings.  Chronic lung findings were noted.  Chest x-ray raised concern for pneumonia however CT scan did not show any active infectious process.  Findings noted on chest x-ray likely related to her chronic interstitial findings.  WBC is normal and she is afebrile.  No concern for pneumonia at this time. ?Cardiology has been consulted.  She had an echocardiogram recently.  Will defer further management to cardiology. ?

## 2021-07-29 NOTE — ED Provider Notes (Signed)
?Debbie [name] ?Provider Note ? ? ?CSN: [id number] ?Arrival date & time: 07/29/21  0437 ? ?  ? ?History ? ?Chief Complaint  ?Patient presents with  ? Chest Pain  ? ? ?Debbie [name] is a 76 y.o. female. ? ? ?Chest Pain ?Patient presenting for chest pain.  Onset was at 2:30 AM.  She states that she was awake at that time.  Pain is located right of sternum.  She took 2 sublingual NTG's with resolution of pain.  EMS was called.  Chest pain did improve with 2 nitroglycerin that patient took at home.  EMS reports that she was initially in atrial fibrillation with RVR with EMS.  Converted to sinus rhythm following 500 cc IV fluid.  Medical history includes HLD, CVA, PAF, HTN, CAD, T2DM, HFpEF.  Patient states that she has been asymptomatic since resolution of the short-lived chest pain episode. ?HPI: A 76 year old patient with a history of CVA, treated diabetes, hypertension and hypercholesterolemia presents for evaluation of chest pain. Initial onset of pain was approximately 3-6 hours ago. The patient's chest pain is not worse with exertion and is relieved by nitroglycerin. The patient's chest pain is middle- or left-sided, is not well-localized, is not described as heaviness/pressure/tightness, is not sharp and does not radiate to the arms/jaw/neck. The patient does not complain of nausea and denies diaphoresis. The patient has no history of peripheral artery disease, has not smoked in the past 90 days, has no relevant family history of coronary artery disease (first degree relative at less than age 63) and does not have an elevated BMI (>=30).  ? ?Home Medications ?Prior to Admission medications   ?Medication Sig Start Date End Date Taking? Authorizing Provider  ?acetaminophen (TYLENOL) 500 MG tablet Take 2 tablets (1,000 mg total) by mouth every 8 (eight) hours as needed. 03/30/20  Yes Christophe Louis, MD  ?apixaban (ELIQUIS) 5 MG TABS tablet Take 1 tablet (5 mg total) by mouth 2 (two)  times daily. 07/25/21 08/24/21 Yes Curatolo, Adam, DO  ?ezetimibe (ZETIA) 10 MG tablet Take 1 tablet by mouth once daily 06/15/21  Yes Troy Sine, MD  ?furosemide (LASIX) 20 MG tablet Take 1 tablet by mouth once daily 06/15/21  Yes Troy Sine, MD  ?gabapentin (NEURONTIN) 300 MG capsule Take 300 mg by mouth in the morning, at noon, and at bedtime. 05/06/21  Yes [provider]  ?HYDROcodone-acetaminophen (NORCO/VICODIN) 5-325 MG tablet Take 1 tablet by mouth every 4 (four) hours as needed for moderate pain. 07/05/21 07/05/22 Yes Meyran, Ocie Cornfield, NP  ?metFORMIN (GLUCOPHAGE) 500 MG tablet Take 500 mg by mouth 2 (two) times daily. 03/23/21  Yes [provider]  ?methocarbamol (ROBAXIN) 500 MG tablet Take 1 tablet (500 mg total) by mouth 4 (four) times daily. ?Patient taking differently: Take 500 mg by mouth every 6 (six) hours as needed for muscle spasms. 07/05/21  Yes Meyran, Ocie Cornfield, NP  ?metoprolol succinate (TOPROL-XL) 100 MG 24 hr tablet Take 1 tablet (100 mg total) by mouth daily. Take with or immediately following a meal. 07/25/21 08/24/21 Yes Curatolo, Adam, DO  ?Multiple Vitamins-Minerals (CENTRUM SILVER ADULT 50+) TABS Take 1 tablet by mouth daily.   Yes [provider]  ?MYRBETRIQ 50 MG TB24 tablet Take 50 mg by mouth daily. 04/12/21  Yes [provider]  ?nitroGLYCERIN (NITROSTAT) 0.4 MG SL tablet Place 1 tablet (0.4 mg total) under the tongue every 5 (five) minutes as needed for chest pain. 04/05/21 07/29/21 Yes  Troy Sine, MD  ?olmesartan (BENICAR) 20 MG tablet Take 1 tablet (20 mg total) by mouth daily. 03/13/21  Yes Troy Sine, MD  ?rosuvastatin (CRESTOR) 5 MG tablet Take 5 mg by mouth daily. 03/29/21  Yes [provider]  ?potassium chloride (KLOR-CON M) 10 MEQ tablet Take 1 tablet (10 mEq total) by mouth 2 (two) times daily for 3 days. ?Patient not taking: Reported on 07/29/2021 07/10/21 07/13/21  Elgergawy, Silver Huguenin, MD  ?   ? ?Allergies     ?Patient has no known allergies.   ? ?Review of Systems   ?Review of Systems  ?Cardiovascular:  Positive for chest pain.  ?Musculoskeletal:  Positive for myalgias (Bilateral legs, chronic and ongoing for the past month).  ?All other systems reviewed and are negative. ? ?Physical Exam ?Updated Vital Signs ?BP (!) 148/59   Pulse (!) 45   Temp 98.7 ?F (37.1 ?C)   Resp 16   Ht 5' 5.5" (1.664 m)   Wt 88 kg   SpO2 92%   BMI 31.79 kg/m?  ?Physical Exam ?Vitals and nursing note reviewed.  ?Constitutional:   ?   General: She is not in acute distress. ?   Appearance: She is well-developed. She is not ill-appearing, toxic-appearing or diaphoretic.  ?HENT:  ?   Head: Normocephalic and atraumatic.  ?Eyes:  ?   Extraocular Movements: Extraocular movements intact.  ?   Conjunctiva/sclera: Conjunctivae normal.  ?Cardiovascular:  ?   Rate and Rhythm: Normal rate and regular rhythm.  ?   Heart sounds: No murmur heard. ?Pulmonary:  ?   Effort: Pulmonary effort is normal. No respiratory distress.  ?   Breath sounds: Normal breath sounds. No wheezing, rhonchi or rales.  ?Chest:  ?   Chest wall: No tenderness.  ?Abdominal:  ?   Palpations: Abdomen is soft.  ?   Tenderness: There is no abdominal tenderness.  ?Musculoskeletal:     ?   General: No swelling. Normal range of motion.  ?   Cervical back: Neck supple.  ?   Right lower leg: No edema.  ?   Left lower leg: No edema.  ?Skin: ?   General: Skin is warm and dry.  ?   Capillary Refill: Capillary refill takes less than 2 seconds.  ?Neurological:  ?   General: No focal deficit present.  ?   Mental Status: She is alert and oriented to person, place, and time.  ?   Cranial Nerves: No cranial nerve deficit.  ?   Motor: No weakness.  ?Psychiatric:     ?   Mood and Affect: Mood normal.     ?   Behavior: Behavior normal.  ? ? ?ED Results / Procedures / Treatments   ?Labs ?(all labs ordered are listed, but only abnormal results are displayed) ?Labs Reviewed  ?BASIC METABOLIC PANEL -  Abnormal; Notable for the following components:  ?    Result Value  ? Chloride 114 (*)   ? CO2 20 (*)   ? Glucose, Bld 154 (*)   ? All other components within normal limits  ?CBC - Abnormal; Notable for the following components:  ? Hemoglobin 11.7 (*)   ? All other components within normal limits  ?HEPATIC FUNCTION PANEL - Abnormal; Notable for the following components:  ? Indirect Bilirubin 1.0 (*)   ? All other components within normal limits  ?MAGNESIUM - Abnormal; Notable for the following components:  ? Magnesium 1.6 (*)   ? All other components  within normal limits  ?BASIC METABOLIC PANEL - Abnormal; Notable for the following components:  ? Sodium 134 (*)   ? Potassium 3.0 (*)   ? CO2 19 (*)   ? Glucose, Bld 113 (*)   ? Calcium 8.8 (*)   ? All other components within normal limits  ?RETICULOCYTES - Abnormal; Notable for the following components:  ? RBC. 3.45 (*)   ? All other components within normal limits  ?CBC - Abnormal; Notable for the following components:  ? RBC 3.52 (*)   ? Hemoglobin 10.7 (*)   ? HCT 35.2 (*)   ? All other components within normal limits  ?CBG MONITORING, ED - Abnormal; Notable for the following components:  ? Glucose-Capillary 141 (*)   ? All other components within normal limits  ?CBG MONITORING, ED - Abnormal; Notable for the following components:  ? Glucose-Capillary 133 (*)   ? All other components within normal limits  ?TROPONIN I (HIGH SENSITIVITY) - Abnormal; Notable for the following components:  ? Troponin I (High Sensitivity) 37 (*)   ? All other components within normal limits  ?TROPONIN I (HIGH SENSITIVITY) - Abnormal; Notable for the following components:  ? Troponin I (High Sensitivity) 35 (*)   ? All other components within normal limits  ?TROPONIN I (HIGH SENSITIVITY) - Abnormal; Notable for the following components:  ? Troponin I (High Sensitivity) 33 (*)   ? All other components within normal limits  ?LIPASE, BLOOD  ?CK  ?LIPID PANEL  ?VITAMIN B12  ?FOLATE  ?IRON  AND TIBC  ?FERRITIN  ?HEPARIN LEVEL (UNFRACTIONATED)  ?APTT  ?BASIC METABOLIC PANEL  ?MAGNESIUM  ? ? ?EKG ?None ? ?Radiology ?DG Chest 2 View ? ?Result Date: 07/29/2021 ?CLINICAL DATA:  76 year old female with hi

## 2021-07-29 NOTE — ED Triage Notes (Signed)
Pt BIB [facility] from home c/o centralized CP that started around 330 am. Pt took 2 nitros and CP subsided. Pt is in a-fib RVR with her HR between 110-170. Pt was given 500 NS and converted back to NS.  ?

## 2021-07-29 NOTE — Progress Notes (Signed)
ANTICOAGULATION CONSULT NOTE - Initial Consult ? ?Pharmacy Consult for heparin ?Indication: atrial fibrillation ? ?No Known Allergies ? ?Patient Measurements: ?  ?Heparin Dosing Weight: 77 Kg ? ?Vital Signs: ?BP: 116/73 (04/16 2300) ?Pulse Rate: 52 (04/16 2345) ? ?Labs: ?Recent Labs  ?  07/29/21 ?0449 07/29/21 ?0920 07/29/21 ?1344  ?HGB 11.7*  --   --   ?HCT 36.6  --   --   ?PLT 344  --   --   ?CREATININE 0.97  --   --   ?CKTOTAL  --  56  --   ?TROPONINIHS 37* 35* 33*  ? ? ?CrCl cannot be calculated (Unknown ideal weight.). ? ? ?Medical History: ?Past Medical History:  ?Diagnosis Date  ? Arthritis   ? CAD (coronary artery disease) 11/1999  ? RCA stent X3, patent '06. Nuc low risk 9/13  ? Cataract   ? beginning stages  ? Diabetes mellitus without complication ([place])   ? Dysrhythmia   ? History of stress test 01/08/2012  ? Showed minimal anterior thinning not felt to be significant.  ? Hx of echocardiogram 12/12/2011  ? EF>55%  ? Hypercholesteremia   ? Hypertension   ? Myocardial infarction [facility])   ? 46 years ago    age 73  ? PAF (paroxysmal atrial fibrillation) ([place]) 11/2011  ? SSS component with some bradycardia  ? Stroke [facility]) 1998  ? Vitamin D deficiency   ? ? ?Medications:  ?Scheduled:  ? ezetimibe  10 mg Oral Daily  ? gabapentin  300 mg Oral BID  ? insulin aspart  0-15 Units Subcutaneous TID WC  ? irbesartan  150 mg Oral Daily  ? metoprolol succinate  50 mg Oral Once  ? mirabegron ER  50 mg Oral Daily  ? multivitamin with minerals  1 tablet Oral Daily  ? rosuvastatin  5 mg Oral Daily  ? ? ?Assessment: 76 y.o. female being transitioned from apixaban to heparin in preparation for cardiac catheterization. Last dose of Eliquis 4/16 @ 1828. Hgb 11.7 stable, PLT WNL ? ?Will start heparin infusion without bolus ~12 hours post last apixaban dose and monitor aptt ? ? ?Goal of Therapy:  ?aPTT 66-102 seconds ?Monitor platelets by anticoagulation protocol: Yes ?  ?Plan:  ?Start heparin infusion at 1100 units/hr '@0630'$  ?Check  aptt/ anti-Xa level in 8 hours and daily while on heparin ?Continue to monitor H&H and platelets ? ?Georga Bora, PharmD ?Clinical Pharmacist ?07/30/2021 12:06 AM ?Please check AMION for all [place] numbers ? ? ? ?

## 2021-07-29 NOTE — Assessment & Plan Note (Signed)
Hemoglobin was normal on March 20 of this year.  Noted to be 8.6 on March 27.  11.7 today.  Check anemia panel.  No evidence of overt blood loss. ?

## 2021-07-29 NOTE — Assessment & Plan Note (Signed)
Magnesium sulfate was ordered.  Additional 2 g will be ordered.  Recheck tomorrow. ?

## 2021-07-29 NOTE — Progress Notes (Signed)
ANTICOAGULATION CONSULT NOTE - Initial Consult ? ?Pharmacy Consult for apixaban ?Indication: atrial fibrillation ? ?No Known Allergies ? ?Patient Measurements:  ?194lb 3.6 oz ?5' 0.5" ? ?Vital Signs: ?BP: 142/67 (04/16 1600) ?Pulse Rate: 53 (04/16 1600) ? ?Labs: ?Recent Labs  ?  07/29/21 ?0449 07/29/21 ?0920 07/29/21 ?1344  ?HGB 11.7*  --   --   ?HCT 36.6  --   --   ?PLT 344  --   --   ?CREATININE 0.97  --   --   ?CKTOTAL  --  56  --   ?TROPONINIHS 37* 35* 33*  ? ? ?CrCl cannot be calculated (Unknown ideal weight.). ? ? ?Medical History: ?Past Medical History:  ?Diagnosis Date  ? Arthritis   ? CAD (coronary artery disease) 11/1999  ? RCA stent X3, patent '06. Nuc low risk 9/13  ? Cataract   ? beginning stages  ? Diabetes mellitus without complication ([place])   ? Dysrhythmia   ? History of stress test 01/08/2012  ? Showed minimal anterior thinning not felt to be significant.  ? Hx of echocardiogram 12/12/2011  ? EF>55%  ? Hypercholesteremia   ? Hypertension   ? Myocardial infarction [facility])   ? 73 years ago    age 58  ? PAF (paroxysmal atrial fibrillation) ([place]) 11/2011  ? SSS component with some bradycardia  ? Stroke [facility]) 1998  ? Vitamin D deficiency   ? ?  ?Assessment: ?76 yo W with afib. Scr 0.97. Pharmacy consulted for apixaban.  ? ? ?Goal of Therapy:  ?Monitor platelets by anticoagulation protocol: Yes ?  ?Plan:  ?Start apixaban '5mg'$  BID ?Monitor for signs/symptoms of bleeding  ? ? ? ?Benetta Spar, PharmD, BCPS, BCCP ?Clinical Pharmacist ? ?Please check AMION for all [facility] phone numbers ?After 10:00 PM, call [place] [phone] ? ?

## 2021-07-29 NOTE — Consult Note (Signed)
?Cardiology Consultation:  ? ?Patient ID: Debbie [name] ?MRN: [id number]; DOB: 1945/08/28 ? ?Admit date: 07/29/2021 ?Date of Consult: 07/29/2021 ? ?PCP:  Sandi Mariscal, MD ?  ?[facility] Providers ?Cardiologist:  Shelva Majestic, MD     ? ? ?Patient Profile:  ? ?Debbie [name] is a 76 y.o. female with a hx of remote stroke (1998), coronary artery disease (3 stents to RCA 2001, last cath 2006 showed stents patent), non insulin dependent diabetes mellitus type 2, hyperlipidemia, hypertension, paroxysmal atrial fibrillation  who is being seen 07/29/2021 for the evaluation of Afib and chest pain at the request of Dr. Doren Custard. ? ?History of Present Illness:  ? ?Ms. Posch is a 76 yo female with recent admission for CHF - HFpEF after back surgery and recent ED visit for afib RVR where she was started on eliquis and arranged for follow up. She tells me she has had increasing angina, which is similar in quality to the chest pain for which she had PCI many years ago. Her chest pain primarily occurs in the setting of afib in the last several weeks and is nitro responsive. She has required more nitro in last several weeks, but notes she occasionally takes nitro for chest pain in last 6 mo. Aware of when she is in afib, + palpitations. No SOB, no syncope. No current HF symptoms and appears euvolemic. Currently chest pain free and in sinus rhythm and sinus bradycardia with PACs. Afib on initial ECG and rates 120s. ? ? ?Past Medical History:  ?Diagnosis Date  ? Arthritis   ? CAD (coronary artery disease) 11/1999  ? RCA stent X3, patent '06. Nuc low risk 9/13  ? Cataract   ? beginning stages  ? Diabetes mellitus without complication ([place])   ? Dysrhythmia   ? History of stress test 01/08/2012  ? Showed minimal anterior thinning not felt to be significant.  ? Hx of echocardiogram 12/12/2011  ? EF>55%  ? Hypercholesteremia   ? Hypertension   ? Myocardial infarction [facility])   ? 86 years ago    age 66  ? PAF (paroxysmal atrial fibrillation) ([place])  11/2011  ? SSS component with some bradycardia  ? Stroke [facility]) 1998  ? Vitamin D deficiency   ? ? ?Past Surgical History:  ?Procedure Laterality Date  ? ABDOMINAL HYSTERECTOMY    ? CATARACT EXTRACTION, BILATERAL    ? CHOLECYSTECTOMY    ? COLONOSCOPY    ? CORONARY ANGIOGRAM  08/2004  ? patent stents  ? CORONARY ANGIOPLASTY WITH STENT PLACEMENT  11/1999  ? X 3  ? EYE SURGERY    ? MULTIPLE TOOTH EXTRACTIONS    ? ROBOTIC ASSISTED TOTAL HYSTERECTOMY WITH BILATERAL SALPINGO OOPHERECTOMY Bilateral 03/29/2020  ? Procedure: XI ROBOTIC ASSISTED TOTAL HYSTERECTOMY WITH BILATERAL SALPINGO OOPHORECTOMY;  Surgeon: Christophe Louis, MD;  Location: [place];  Service: Gynecology;  Laterality: Bilateral;  Tracie to RNFA confirmed on 02/16/20 CS  ?  ? ?Home Medications:  ?Prior to Admission medications   ?Medication Sig Start Date End Date Taking? Authorizing Provider  ?acetaminophen (TYLENOL) 500 MG tablet Take 2 tablets (1,000 mg total) by mouth every 8 (eight) hours as needed. 03/30/20  Yes Christophe Louis, MD  ?apixaban (ELIQUIS) 5 MG TABS tablet Take 1 tablet (5 mg total) by mouth 2 (two) times daily. 07/25/21 08/24/21 Yes Curatolo, Adam, DO  ?ezetimibe (ZETIA) 10 MG tablet Take 1 tablet by mouth once daily 06/15/21  Yes Troy Sine, MD  ?furosemide (LASIX) 20 MG tablet Take  1 tablet by mouth once daily 06/15/21  Yes Troy Sine, MD  ?gabapentin (NEURONTIN) 300 MG capsule Take 300 mg by mouth in the morning, at noon, and at bedtime. 05/06/21  Yes [provider]  ?HYDROcodone-acetaminophen (NORCO/VICODIN) 5-325 MG tablet Take 1 tablet by mouth every 4 (four) hours as needed for moderate pain. 07/05/21 07/05/22 Yes Meyran, Ocie Cornfield, NP  ?metFORMIN (GLUCOPHAGE) 500 MG tablet Take 500 mg by mouth 2 (two) times daily. 03/23/21  Yes [provider]  ?methocarbamol (ROBAXIN) 500 MG tablet Take 1 tablet (500 mg total) by mouth 4 (four) times daily. ?Patient taking differently: Take 500 mg by mouth every 6 (six) hours as needed  for muscle spasms. 07/05/21  Yes Meyran, Ocie Cornfield, NP  ?metoprolol succinate (TOPROL-XL) 100 MG 24 hr tablet Take 1 tablet (100 mg total) by mouth daily. Take with or immediately following a meal. 07/25/21 08/24/21 Yes Curatolo, Adam, DO  ?Multiple Vitamins-Minerals (CENTRUM SILVER ADULT 50+) TABS Take 1 tablet by mouth daily.   Yes [provider]  ?MYRBETRIQ 50 MG TB24 tablet Take 50 mg by mouth daily. 04/12/21  Yes [provider]  ?nitroGLYCERIN (NITROSTAT) 0.4 MG SL tablet Place 1 tablet (0.4 mg total) under the tongue every 5 (five) minutes as needed for chest pain. 04/05/21 07/29/21 Yes Troy Sine, MD  ?olmesartan (BENICAR) 20 MG tablet Take 1 tablet (20 mg total) by mouth daily. 03/13/21  Yes Troy Sine, MD  ?rosuvastatin (CRESTOR) 5 MG tablet Take 5 mg by mouth daily. 03/29/21  Yes [provider]  ?potassium chloride (KLOR-CON M) 10 MEQ tablet Take 1 tablet (10 mEq total) by mouth 2 (two) times daily for 3 days. ?Patient not taking: Reported on 07/29/2021 07/10/21 07/13/21  Elgergawy, Silver Huguenin, MD  ? ? ?Inpatient Medications: ?Scheduled Meds: ? ezetimibe  10 mg Oral Daily  ? gabapentin  300 mg Oral BID  ? insulin aspart  0-15 Units Subcutaneous TID WC  ? irbesartan  150 mg Oral Daily  ? [START ON 07/30/2021] metoprolol succinate  50 mg Oral Once  ? mirabegron ER  50 mg Oral Daily  ? multivitamin with minerals  1 tablet Oral Daily  ? rosuvastatin  5 mg Oral Daily  ? ?Continuous Infusions: ? sodium chloride    ? ?PRN Meds: ? ? ?Allergies:   No Known Allergies ? ?Social History:   ?Social History  ? ?Socioeconomic History  ? Marital status: Married  ?  Spouse name: Gwyndolyn Saxon  ? Number of children: 1  ? Years of education: 55  ? Highest education level: Not on file  ?Occupational History  ? Occupation: Retired  ?  Comment: program cordinator at [employer]  ?Tobacco Use  ? Smoking status: Some Days  ?  Years: 60.00  ?  Types: Cigarettes  ? Smokeless tobacco: Never  ? Tobacco  comments:  ?  2-3 cigarettes per day.  ?Vaping Use  ? Vaping Use: Never used  ?Substance and Sexual Activity  ? Alcohol use: Not Currently  ? Drug use: No  ? Sexual activity: Not on file  ?Other Topics Concern  ? Not on file  ?Social History Narrative  ? Right handed  ? Lives with husband  ? Caffeine use: Tea twice per week  ? ?Social Determinants of Health  ? ?Financial Resource Strain: Low Risk   ? Difficulty of Paying Living Expenses: Not hard at all  ?Food Insecurity: No Food Insecurity  ? Worried About Estate manager/land agent  of Food in the Last Year: Never true  ? Ran Out of Food in the Last Year: Never true  ?Transportation Needs: No Transportation Needs  ? Lack of Transportation (Medical): No  ? Lack of Transportation (Non-Medical): No  ?Physical Activity: Not on file  ?Stress: Not on file  ?Social Connections: Not on file  ?Intimate Partner Violence: Not on file  ?  ?Family History:   ? ?Family History  ?Problem Relation Age of Onset  ? Diabetes type II Sister   ? Heart disease Sister   ? Colon cancer Neg Hx   ? Colon polyps Neg Hx   ? Esophageal cancer Neg Hx   ? Rectal cancer Neg Hx   ? Stomach cancer Neg Hx   ?  ? ?ROS:  ?Please see the history of present illness.  ? ?All other ROS reviewed and negative.    ? ?Physical Exam/Data:  ? ?Vitals:  ? 07/29/21 1500 07/29/21 1600 07/29/21 1811 07/29/21 2000  ?BP: (!) 160/65 (!) 142/67 (!) 143/68 133/77  ?Pulse: (!) 58 (!) 53  (!) 58  ?Resp: '19 14  19  '$ ?Temp:      ?SpO2: 96% 91%  91%  ? ? ?Intake/Output Summary (Last 24 hours) at 07/29/2021 2348 ?Last data filed at 07/29/2021 1228 ?Gross per 24 hour  ?Intake 50 ml  ?Output --  ?Net 50 ml  ? ? ?  07/10/2021  ?  4:08 AM 07/09/2021  ?  8:15 AM 07/04/2021  ?  8:14 AM  ?Last 3 Weights  ?Weight (lbs) 194 lb 3.6 oz 197 lb 8.5 oz 196 lb  ?Weight (kg) 88.1 kg 89.6 kg 88.905 kg  ?   ?There is no height or weight on file to calculate BMI.  ?General:  Well nourished, well developed, in no acute distress ?HEENT: normal ?Neck: no  JVD ?Vascular: No carotid bruits; Distal pulses 2+ bilaterally ?Cardiac:  normal S1, S2; RRR; no murmur  ?Lungs:  clear to auscultation bilaterally, no wheezing, rhonchi or rales  ?Abd: soft, nontender, no hepatomegaly

## 2021-07-29 NOTE — H&P (Signed)
?[facility] ?History and Physical ? ?Debbie [name] JOA:[id number] DOB: [birth year] DOA: 07/29/2021 ? ? ?PCP: Sandi Mariscal, MD  ?Specialists: Dr. Ellouise Newer is her cardiologist.  Dr. Sherley Bounds is her neurosurgeon ? ?Chief Complaint: Chest pain ? ?HPI: Debbie [name] is a 76 y.o. female with a past medical history of diabetes mellitus type 2, essential hypertension, previous history of stroke, coronary artery disease, who underwent lumbar spine surgery including lumbar laminectomy, fusion and fixation on 07/04/2021 by Dr. Ronnald Ramp.  She was hospitalized on 3/27 after she presented with chest pain and shortness of breath thought to be secondary to volume overload.  Discharged on 3/28.  Subsequently presented to the emergency department on 4/12 with palpitations shortness of breath and chest pain and was found to have atrial fibrillation.  She converted to sinus rhythm while she was in the ED.  Case was discussed with cardiology who made recommendations to increase her metoprolol dose and switching her from aspirin to apixaban.  Outpatient follow-up was arranged and patient was discharged home from the emergency department.  She was in her usual state of health until this morning around 3 AM when she started developing pressure-like sensation in the central part of her chest without any radiation.  The pain was 8 out of 10 in intensity.  Associated with some shortness of breath.  No palpitations at that time.  No dizziness or lightheadedness.  She took 2 nitroglycerin and the chest pain subsided.  EMS was called and the patient was brought into the emergency department.  She was apparently in atrial fibrillation with RVR with heart rate between 110-170.  She was given a normal saline bolus following which she converted back to sinus rhythm.  Currently she denies any chest discomfort.  Denies any shortness of breath.  No lightheadedness. ? ?In the emergency department she was given fluid bolus.  Cardiology was consulted.   CT angiogram was done which did not show any PE.  Noted to have hypomagnesemia which was supplemented.  Will be hospitalized for further management. ? ?Home Medications: ?Prior to Admission medications   ?Medication Sig Start Date End Date Taking? Authorizing Provider  ?acetaminophen (TYLENOL) 500 MG tablet Take 2 tablets (1,000 mg total) by mouth every 8 (eight) hours as needed. 03/30/20  Yes Christophe Louis, MD  ?apixaban (ELIQUIS) 5 MG TABS tablet Take 1 tablet (5 mg total) by mouth 2 (two) times daily. 07/25/21 08/24/21 Yes Curatolo, Adam, DO  ?ezetimibe (ZETIA) 10 MG tablet Take 1 tablet by mouth once daily 06/15/21  Yes Troy Sine, MD  ?furosemide (LASIX) 20 MG tablet Take 1 tablet by mouth once daily 06/15/21  Yes Troy Sine, MD  ?gabapentin (NEURONTIN) 300 MG capsule Take 300 mg by mouth in the morning, at noon, and at bedtime. 05/06/21  Yes [provider]  ?HYDROcodone-acetaminophen (NORCO/VICODIN) 5-325 MG tablet Take 1 tablet by mouth every 4 (four) hours as needed for moderate pain. 07/05/21 07/05/22 Yes Meyran, Ocie Cornfield, NP  ?metFORMIN (GLUCOPHAGE) 500 MG tablet Take 500 mg by mouth 2 (two) times daily. 03/23/21  Yes [provider]  ?methocarbamol (ROBAXIN) 500 MG tablet Take 1 tablet (500 mg total) by mouth 4 (four) times daily. ?Patient taking differently: Take 500 mg by mouth every 6 (six) hours as needed for muscle spasms. 07/05/21  Yes Meyran, Ocie Cornfield, NP  ?metoprolol succinate (TOPROL-XL) 100 MG 24 hr tablet Take 1 tablet (100 mg total) by mouth daily. Take with or immediately following a  meal. 07/25/21 08/24/21 Yes Curatolo, Adam, DO  ?Multiple Vitamins-Minerals (CENTRUM SILVER ADULT 50+) TABS Take 1 tablet by mouth daily.   Yes [provider]  ?MYRBETRIQ 50 MG TB24 tablet Take 50 mg by mouth daily. 04/12/21  Yes [provider]  ?nitroGLYCERIN (NITROSTAT) 0.4 MG SL tablet Place 1 tablet (0.4 mg total) under the tongue every 5 (five) minutes as  needed for chest pain. 04/05/21 07/29/21 Yes Troy Sine, MD  ?olmesartan (BENICAR) 20 MG tablet Take 1 tablet (20 mg total) by mouth daily. 03/13/21  Yes Troy Sine, MD  ?rosuvastatin (CRESTOR) 5 MG tablet Take 5 mg by mouth daily. 03/29/21  Yes [provider]  ?potassium chloride (KLOR-CON M) 10 MEQ tablet Take 1 tablet (10 mEq total) by mouth 2 (two) times daily for 3 days. ?Patient not taking: Reported on 07/29/2021 07/10/21 07/13/21  Elgergawy, Silver Huguenin, MD  ? ? ?Allergies: No Known Allergies ? ?Past Medical History: ?Past Medical History:  ?Diagnosis Date  ? Arthritis   ? CAD (coronary artery disease) 11/1999  ? RCA stent X3, patent '06. Nuc low risk 9/13  ? Cataract   ? beginning stages  ? Diabetes mellitus without complication ([place])   ? Dysrhythmia   ? History of stress test 01/08/2012  ? Showed minimal anterior thinning not felt to be significant.  ? Hx of echocardiogram 12/12/2011  ? EF>55%  ? Hypercholesteremia   ? Hypertension   ? Myocardial infarction [facility])   ? 31 years ago    age 35  ? PAF (paroxysmal atrial fibrillation) ([place]) 11/2011  ? SSS component with some bradycardia  ? Stroke [facility]) 1998  ? Vitamin D deficiency   ? ? ?Past Surgical History:  ?Procedure Laterality Date  ? ABDOMINAL HYSTERECTOMY    ? CATARACT EXTRACTION, BILATERAL    ? CHOLECYSTECTOMY    ? COLONOSCOPY    ? CORONARY ANGIOGRAM  08/2004  ? patent stents  ? CORONARY ANGIOPLASTY WITH STENT PLACEMENT  11/1999  ? X 3  ? EYE SURGERY    ? MULTIPLE TOOTH EXTRACTIONS    ? ROBOTIC ASSISTED TOTAL HYSTERECTOMY WITH BILATERAL SALPINGO OOPHERECTOMY Bilateral 03/29/2020  ? Procedure: XI ROBOTIC ASSISTED TOTAL HYSTERECTOMY WITH BILATERAL SALPINGO OOPHORECTOMY;  Surgeon: Christophe Louis, MD;  Location: [place];  Service: Gynecology;  Laterality: Bilateral;  Tracie to RNFA confirmed on 02/16/20 CS  ? ? ?Social History: Lives with her husband.  No history of smoking alcohol use or illicit drug use.  He has been using a walker to ambulate ever  since her spinal surgery recently. ? ?Family History:  ?Family History  ?Problem Relation Age of Onset  ? Diabetes type II Sister   ? Heart disease Sister   ? Colon cancer Neg Hx   ? Colon polyps Neg Hx   ? Esophageal cancer Neg Hx   ? Rectal cancer Neg Hx   ? Stomach cancer Neg Hx   ?  ? ?Review of Systems - History obtained from the patient ?General ROS: positive for  - fatigue ?Psychological ROS: negative ?Ophthalmic ROS: negative ?ENT ROS: negative ?Allergy and Immunology ROS: negative ?Hematological and Lymphatic ROS: negative ?Endocrine ROS: negative ?Respiratory ROS: As in HPI ?Cardiovascular ROS: As in HPI ?Gastrointestinal ROS: no abdominal pain, change in bowel habits, or black or bloody stools ?Genito-Urinary ROS: no dysuria, trouble voiding, or hematuria ?Musculoskeletal ROS: negative ?Neurological ROS: no TIA or stroke symptoms ?Dermatological ROS: negative ? ? ?Physical Examination ? ?Vitals:  ? 07/29/21 1400 07/29/21  1430 07/29/21 1500 07/29/21 1600  ?BP: (!) 156/69 (!) 148/64 (!) 160/65 (!) 142/67  ?Pulse: (!) 57 (!) 57 (!) 58 (!) 53  ?Resp: '20 17 19 14  '$ ?Temp:      ?SpO2: 95% 94% 96% 91%  ? ? ?BP (!) 142/67   Pulse (!) 53   Temp 98.7 ?F (37.1 ?C)   Resp 14   SpO2 91%  ? ?General appearance: alert, cooperative, appears stated age, and no distress ?Head: Normocephalic, without obvious abnormality, atraumatic ?Eyes: conjunctivae/corneas clear. PERRL, EOM's intact.  ?Throat: lips, mucosa, and tongue normal; teeth and gums normal ?Neck: no adenopathy, no carotid bruit, no JVD, supple, symmetrical, trachea midline, and thyroid not enlarged, symmetric, no tenderness/mass/nodules ?Resp: clear to auscultation bilaterally ?Cardio: regular rate and rhythm, S1, S2 normal, no murmur, click, rub or gallop ?GI: soft, non-tender; bowel sounds normal; no masses,  no organomegaly ?Extremities: extremities normal, atraumatic, no cyanosis or edema ?Pulses: 2+ and symmetric ?Skin: Skin color, texture, turgor normal.  No rashes or lesions ?Lymph nodes: Cervical, supraclavicular, and axillary nodes normal. ?Neurologic: Alert and oriented x3.  No cranial nerve deficits.  Motor strength is equal bilateral upper and lower ext

## 2021-07-29 NOTE — Assessment & Plan Note (Signed)
Underwent surgery in March.  Done by Dr. Ronnald Ramp.  No neurological deficits noted at this time. ?

## 2021-07-30 DIAGNOSIS — E119 Type 2 diabetes mellitus without complications: Secondary | ICD-10-CM

## 2021-07-30 DIAGNOSIS — E785 Hyperlipidemia, unspecified: Secondary | ICD-10-CM

## 2021-07-30 DIAGNOSIS — I480 Paroxysmal atrial fibrillation: Secondary | ICD-10-CM | POA: Diagnosis not present

## 2021-07-30 DIAGNOSIS — R079 Chest pain, unspecified: Secondary | ICD-10-CM | POA: Diagnosis not present

## 2021-07-30 DIAGNOSIS — Z8673 Personal history of transient ischemic attack (TIA), and cerebral infarction without residual deficits: Secondary | ICD-10-CM

## 2021-07-30 DIAGNOSIS — I10 Essential (primary) hypertension: Secondary | ICD-10-CM | POA: Diagnosis not present

## 2021-07-30 DIAGNOSIS — I25110 Atherosclerotic heart disease of native coronary artery with unstable angina pectoris: Secondary | ICD-10-CM | POA: Diagnosis not present

## 2021-07-30 DIAGNOSIS — Z981 Arthrodesis status: Secondary | ICD-10-CM

## 2021-07-30 LAB — CBC
HCT: 35.2 % — ABNORMAL LOW (ref 36.0–46.0)
Hemoglobin: 10.7 g/dL — ABNORMAL LOW (ref 12.0–15.0)
MCH: 30.4 pg (ref 26.0–34.0)
MCHC: 30.4 g/dL (ref 30.0–36.0)
MCV: 100 fL (ref 80.0–100.0)
Platelets: 274 10*3/uL (ref 150–400)
RBC: 3.52 MIL/uL — ABNORMAL LOW (ref 3.87–5.11)
RDW: 15.2 % (ref 11.5–15.5)
WBC: 7 10*3/uL (ref 4.0–10.5)
nRBC: 0 % (ref 0.0–0.2)

## 2021-07-30 LAB — LIPID PANEL
Cholesterol: 110 mg/dL (ref 0–200)
HDL: 44 mg/dL (ref >40)
LDL Cholesterol: 47 mg/dL (ref 0–99)
Total CHOL/HDL Ratio: 2.5 RATIO
Triglycerides: 96 mg/dL (ref <150)
VLDL: 19 mg/dL (ref 0–40)

## 2021-07-30 LAB — CBG MONITORING, ED
Glucose-Capillary: 133 mg/dL — ABNORMAL HIGH (ref 70–99)
Glucose-Capillary: 117 mg/dL — ABNORMAL HIGH (ref 70–99)
Glucose-Capillary: 104 mg/dL — ABNORMAL HIGH (ref 70–99)

## 2021-07-30 LAB — IRON AND TIBC
Iron: 32 ug/dL (ref 28–170)
Saturation Ratios: 12 % (ref 10.4–31.8)
TIBC: 267 ug/dL (ref 250–450)
UIBC: 235 ug/dL

## 2021-07-30 LAB — FOLATE: Folate: 12.1 ng/mL (ref >5.9)

## 2021-07-30 LAB — BASIC METABOLIC PANEL
Anion gap: 8 (ref 5–15)
Anion gap: 10 (ref 5–15)
BUN: 13 mg/dL (ref 8–23)
BUN: 14 mg/dL (ref 8–23)
CO2: 19 mmol/L — ABNORMAL LOW (ref 22–32)
CO2: 23 mmol/L (ref 22–32)
Calcium: 8.8 mg/dL — ABNORMAL LOW (ref 8.9–10.3)
Calcium: 9.8 mg/dL (ref 8.9–10.3)
Chloride: 107 mmol/L (ref 98–111)
Chloride: 106 mmol/L (ref 98–111)
Creatinine, Ser: 0.94 mg/dL (ref 0.44–1.00)
Creatinine, Ser: 1.04 mg/dL — ABNORMAL HIGH (ref 0.44–1.00)
GFR, Estimated: >60 mL/min (ref >60)
GFR, Estimated: 55 mL/min — ABNORMAL LOW (ref >60)
Glucose, Bld: 113 mg/dL — ABNORMAL HIGH (ref 70–99)
Glucose, Bld: 132 mg/dL — ABNORMAL HIGH (ref 70–99)
Potassium: 3 mmol/L — ABNORMAL LOW (ref 3.5–5.1)
Potassium: 3.2 mmol/L — ABNORMAL LOW (ref 3.5–5.1)
Sodium: 134 mmol/L — ABNORMAL LOW (ref 135–145)
Sodium: 139 mmol/L (ref 135–145)

## 2021-07-30 LAB — FERRITIN: Ferritin: 105 ng/mL (ref 11–307)

## 2021-07-30 LAB — RETICULOCYTES
Immature Retic Fract: 11 % (ref 2.3–15.9)
RBC.: 3.45 MIL/uL — ABNORMAL LOW (ref 3.87–5.11)
Retic Count, Absolute: 56.6 10*3/uL (ref 19.0–186.0)
Retic Ct Pct: 1.6 % (ref 0.4–3.1)

## 2021-07-30 LAB — APTT: aPTT: 196 seconds (ref 24–36)

## 2021-07-30 LAB — VITAMIN B12: Vitamin B-12: 285 pg/mL (ref 180–914)

## 2021-07-30 LAB — HEPARIN LEVEL (UNFRACTIONATED): Heparin Unfractionated: >1.1 IU/mL — ABNORMAL HIGH (ref 0.30–0.70)

## 2021-07-30 LAB — MAGNESIUM: Magnesium: 2.4 mg/dL (ref 1.7–2.4)

## 2021-07-30 LAB — GLUCOSE, CAPILLARY: Glucose-Capillary: 139 mg/dL — ABNORMAL HIGH (ref 70–99)

## 2021-07-30 MED ORDER — POTASSIUM CHLORIDE CRYS ER 20 MEQ PO TBCR
40.0000 meq | EXTENDED_RELEASE_TABLET | ORAL | Status: AC
  Administered 2021-07-30 (×2): 40 meq via ORAL
  Filled 2021-07-30 (×2): qty 2

## 2021-07-30 MED ORDER — SODIUM CHLORIDE 0.9 % WEIGHT BASED INFUSION
3.0000 mL/kg/h | INTRAVENOUS | Status: AC
  Administered 2021-07-31: 3 mL/kg/h via INTRAVENOUS

## 2021-07-30 MED ORDER — SODIUM CHLORIDE 0.9% FLUSH: 3.0000 mL | INTRAVENOUS | Status: DC | PRN

## 2021-07-30 MED ORDER — SODIUM CHLORIDE 0.9% FLUSH
3.0000 mL | Freq: Two times a day (BID) | INTRAVENOUS | Status: DC
  Administered 2021-07-30 – 2021-08-01 (×4): 3 mL via INTRAVENOUS

## 2021-07-30 MED ORDER — ASPIRIN 81 MG PO CHEW
81.0000 mg | CHEWABLE_TABLET | ORAL | Status: AC
  Administered 2021-07-31: 81 mg via ORAL
  Filled 2021-07-30: qty 1

## 2021-07-30 MED ORDER — HEPARIN (PORCINE) 25000 UT/250ML-% IV SOLN
650.0000 [IU]/h | INTRAVENOUS | Status: DC
  Administered 2021-07-30: 800 [IU]/h via INTRAVENOUS
  Administered 2021-07-31: 650 [IU]/h via INTRAVENOUS
  Filled 2021-07-30: qty 250

## 2021-07-30 MED ORDER — SODIUM CHLORIDE 0.9 % WEIGHT BASED INFUSION
1.0000 mL/kg/h | INTRAVENOUS | Status: DC
  Administered 2021-07-31: 1 mL/kg/h via INTRAVENOUS

## 2021-07-30 MED ORDER — SODIUM CHLORIDE 0.9 % IV SOLN: 250.0000 mL | INTRAVENOUS | Status: DC | PRN

## 2021-07-30 MED ORDER — HEPARIN (PORCINE) 25000 UT/250ML-% IV SOLN
1100.0000 [IU]/h | INTRAVENOUS | Status: DC
  Administered 2021-07-30: 1100 [IU]/h via INTRAVENOUS
  Filled 2021-07-30: qty 250

## 2021-07-30 NOTE — Progress Notes (Addendum)
? ?Progress Note ? ?Patient Name: Debbie [name] ?Date of Encounter: 07/30/2021 ? ?[facility] Cardiologist: Shelva Majestic, MD  ? ?Subjective  ? ?No chest pain no SOB ? ?Inpatient Medications  ?  ?Scheduled Meds: ? ezetimibe  10 mg Oral Daily  ? gabapentin  300 mg Oral BID  ? insulin aspart  0-15 Units Subcutaneous TID WC  ? irbesartan  150 mg Oral Daily  ? metoprolol succinate  50 mg Oral Once  ? mirabegron ER  50 mg Oral Daily  ? multivitamin with minerals  1 tablet Oral Daily  ? rosuvastatin  5 mg Oral Daily  ? ?Continuous Infusions: ? sodium chloride    ? heparin 1,100 Units/hr (07/30/21 0553)  ? ?PRN Meds: ?acetaminophen, HYDROcodone-acetaminophen, methocarbamol, ondansetron (ZOFRAN) IV  ? ?Vital Signs  ?  ?Vitals:  ? 07/30/21 0800 07/30/21 0830 07/30/21 0900 07/30/21 0930  ?BP: (!) 151/113 (!) 163/68 90/77 (!) 170/62  ?Pulse: 88 (!) 45 62 (!) 51  ?Resp: '17 16 20 15  '$ ?Temp:      ?SpO2: 93% 98% 96% 98%  ?Weight:      ?Height:      ? ? ?Intake/Output Summary (Last 24 hours) at 07/30/2021 0948 ?Last data filed at 07/30/2021 0455 ?Gross per 24 hour  ?Intake 50 ml  ?Output 300 ml  ?Net -250 ml  ? ? ?  07/29/2021  ? 11:45 PM 07/10/2021  ?  4:08 AM 07/09/2021  ?  8:15 AM  ?Last 3 Weights  ?Weight (lbs) 194 lb 0.1 oz 194 lb 3.6 oz 197 lb 8.5 oz  ?Weight (kg) 88 kg 88.1 kg 89.6 kg  ?   ? ?Telemetry  ?  ?SB to 49 pt awake and talking - Personally Reviewed ? ?ECG  ?  ?SR to SB with no acute changes - Personally Reviewed ? ?Physical Exam  ? ?GEN: No acute distress.   ?Neck: No JVD ?Cardiac: RRR, no murmurs, rubs, or gallops.  ?Respiratory: Clear to auscultation bilaterally. ?GI: Soft, nontender, non-distended  ?MS: No edema; No deformity. ?Neuro:  Nonfocal  ?Psych: Normal affect  ? ?Labs  ?  ?High Sensitivity Troponin:   ?Recent Labs  ?Lab 07/09/21 ?1020 07/25/21 ?1834 07/29/21 ?0449 07/29/21 ?0920 07/29/21 ?1344  ?TROPONINIHS 73* 33* 37* 35* 33*  ?   ?Chemistry ?Recent Labs  ?Lab 07/25/21 ?5176 07/29/21 ?1607 07/29/21 ?0920  07/30/21 ?3710 07/30/21 ?6269  ?NA 141 142  --  134* 139  ?K 3.2* 4.0  --  3.0* 3.2*  ?CL 113* 114*  --  107 106  ?CO2 20* 20*  --  19* 23  ?GLUCOSE 117* 154*  --  113* 132*  ?BUN 12 13  --  13 14  ?CREATININE 0.89 0.97  --  0.94 1.04*  ?CALCIUM 9.3 9.6  --  8.8* 9.8  ?MG 1.5*  --  1.6*  --  2.4  ?PROT 6.4*  --  6.7  --   --   ?ALBUMIN 3.5  --  3.8  --   --   ?AST 18  --  21  --   --   ?ALT 21  --  22  --   --   ?ALKPHOS 127*  --  126  --   --   ?BILITOT 0.9  --  1.1  --   --   ?GFRNONAA >60 >60  --  >60 55*  ?ANIONGAP 8 8  --  8 10  ?  ?Lipids  ?Recent Labs  ?Lab 07/30/21 ?  7124  ?CHOL 110  ?TRIG 96  ?HDL 44  ?[place] 47  ?CHOLHDL 2.5  ?  ?Hematology ?Recent Labs  ?Lab 07/25/21 ?1834 07/29/21 ?5809 07/30/21 ?9833  ?WBC 8.6 8.3 7.0  ?RBC 3.66* 3.87 3.52*  3.45*  ?HGB 11.2* 11.7* 10.7*  ?HCT 35.3* 36.6 35.2*  ?MCV 96.4 94.6 100.0  ?MCH 30.6 30.2 30.4  ?MCHC 31.7 32.0 30.4  ?RDW 15.4 15.0 15.2  ?PLT 381 344 274  ? ?Thyroid  ?Recent Labs  ?Lab 07/25/21 ?1834  ?TSH 1.401  ?FREET4 1.09  ?  ?BNP ?Recent Labs  ?Lab 07/25/21 ?1834  ?BNP 242.1*  ?  ?DDimer No results for input(s): DDIMER in the last 168 hours.  ? ?Radiology  ?  ?DG Chest 2 View ? ?Result Date: 07/29/2021 ?CLINICAL DATA:  76 year old female with history of chest pain. EXAM: CHEST - 2 VIEW COMPARISON:  Chest x-ray 07/25/2021. FINDINGS: Lung volumes are low. Areas of interstitial prominence and peribronchial cuffing are noted throughout the mid to lower lungs bilaterally (left-greater-than-right). No confluent consolidative airspace disease. No pleural effusions. No pneumothorax. Small calcified granuloma in the apex of the right upper lobe. No other definite suspicious appearing pulmonary nodules or masses are noted. No evidence of pulmonary edema. Heart size is normal. Upper mediastinal contours are within normal limits. Atherosclerotic calcifications in the thoracic aorta. IMPRESSION: 1. Patchy areas of interstitial prominence and peribronchial cuffing, most  evident in the left mid to lower lung, concerning for bronchitis and potential developing bronchopneumonia. 2. Aortic atherosclerosis. Electronically Signed   By: Vinnie Langton M.D.   On: 07/29/2021 06:37  ? ?CT Angio Chest PE W and/or Wo Contrast ? ?Result Date: 07/29/2021 ?CLINICAL DATA:  Chest pain beginning early this morning. EXAM: CT ANGIOGRAPHY CHEST WITH CONTRAST TECHNIQUE: Multidetector CT imaging of the chest was performed using the standard protocol during bolus administration of intravenous contrast. Multiplanar CT image reconstructions and MIPs were obtained to evaluate the vascular anatomy. RADIATION DOSE REDUCTION: This exam was performed according to the departmental dose-optimization program which includes automated exposure control, adjustment of the mA and/or kV according to patient size and/or use of iterative reconstruction technique. CONTRAST:  138m OMNIPAQUE IOHEXOL 350 MG/ML SOLN COMPARISON:  07/09/2021. FINDINGS: Cardiovascular: Pulmonary arteries are well opacified. There is no evidence of a pulmonary embolism. Heart is mildly enlarged. No pericardial effusion. Right and left coronary artery calcifications. Great vessels are normal caliber. Aortic atherosclerosis. Mediastinum/Nodes: No neck base, mediastinal or hilar masses or enlarged lymph nodes. Trachea and esophagus are unremarkable. Lungs/Pleura: Vascular prominence bilaterally. Linear and patchy opacities in the lower lobes consistent with atelectasis. Bilateral areas of peripheral interstitial thickening. These findings are similar to the prior chest CTA. No convincing pneumonia or pulmonary edema. No pleural effusion or pneumothorax. Mild centrilobular emphysema. Upper Abdomen: No acute abnormality. Musculoskeletal: No fracture or acute finding. No bone lesion. No chest wall mass. Review of the MIP images confirms the above findings. IMPRESSION: 1. No evidence of a pulmonary embolism. 2. No acute findings. 3. Chronic lung  findings including peripheral interstitial thickening with some reticulation suggesting interstitial fibrosis. Mild basilar atelectasis and/or scarring. Mild emphysema. Aortic Atherosclerosis (ICD10-I70.0) and Emphysema (ICD10-J43.9). Electronically Signed   By: DLajean ManesM.D.   On: 07/29/2021 10:44   ? ?Cardiac Studies  ? ?Echo 07/09/21 ? ?IMPRESSIONS  ? ? ? 1. Left ventricular ejection fraction, by estimation, is 55 to 60%. The  ?left ventricle has normal function. The left ventricle has no regional  ?wall motion abnormalities.  Left ventricular diastolic function could not  ?be evaluated.  ? 2. Right ventricular systolic function is normal. The right ventricular  ?size is normal.  ? 3. The mitral valve is normal in structure. Trivial mitral valve  ?regurgitation. No evidence of mitral stenosis.  ? 4. The aortic valve is tricuspid. Aortic valve regurgitation is not  ?visualized. No aortic stenosis is present.  ? 5. The inferior vena cava is normal in size with greater than 50%  ?respiratory variability, suggesting right atrial pressure of 3 mmHg.  ? ?Comparison(s): No prior Echocardiogram.  ? ?FINDINGS  ? Left Ventricle: Left ventricular ejection fraction, by estimation, is 55  ?to 60%. The left ventricle has normal function. The left ventricle has no  ?regional wall motion abnormalities. The left ventricular internal cavity  ?size was normal in size. There is  ? no left ventricular hypertrophy. Left ventricular diastolic function  ?could not be evaluated.  ? ?Right Ventricle: The right ventricular size is normal. Right ventricular  ?systolic function is normal.  ? ?Left Atrium: Left atrial size was normal in size.  ? ?Right Atrium: Right atrial size was normal in size.  ? ?Pericardium: Trivial pericardial effusion is present.  ? ?Mitral Valve: The mitral valve is normal in structure. Trivial mitral  ?valve regurgitation. No evidence of mitral valve stenosis.  ? ?Tricuspid Valve: The tricuspid valve is normal in  structure. Tricuspid  ?valve regurgitation is trivial. No evidence of tricuspid stenosis.  ? ?Aortic Valve: The aortic valve is tricuspid. Aortic valve regurgitation is  ?not visualized. No aortic stenosis

## 2021-07-30 NOTE — Progress Notes (Addendum)
?PROGRESS NOTE ? ? ? ?Debbie [name]  UDJ:[id number] DOB: [birth year] DOA: 07/29/2021 ?PCP: Sandi Mariscal, MD  ? ? ?Brief Narrative:  ? Debbie [name] is a 76 y.o. female with past medical history of diabetes mellitus type 2, hypertension, history of stroke, CAD and history of lumbar spine surgery including lumbar laminectomy, fusion and fixation on 07/04/2021 by Dr. Ronnald Ramp, who was hospitalized on 07/09/2021 with chest pain and shortness of breath.  At that time she was thought to be having volume overload and discharged on 328.  Patient subsequently presented on 07/25/2021 with shortness of breath and chest pain and was noted to have atrial fibrillation which was converted to sinus while in the ED.  Cardiology had seen the patient at that time and metoprolol dose was increased and she was switched to Eliquis.  This time patient presented with pressure-like sensation in the central part of the chest which improved with sublingual nitro.  EMS was called in and patient was brought into the hospital.  Cardiology was consulted.  CT angiogram of the chest did not show any pulmonary embolism.   ? ?Assessment and plan ? ?Chest pain/pressure ?Patient had mild elevated troponin.  History of recent atrial fibrillation on metoprolol.  Chest pressure improved with nitroglycerin.  CTA chest without any PE.  Chest x-ray showed chronic interstitial findings.  No concern for pneumonia.  Cardiology was again consulted.  Cardiology recommending cardiac catheterization for possible progressive angina. ?  ?Coronary artery disease ?History of CAD and PCI.  On Eliquis since 07/25/2021.  Was on beta-blocker, hold for now due to bradycardia.  Continue statins.. ?  ?Type 2 diabetes mellitus without complication, without long-term current use of insulin ([place]) ?HbA1c of 7.1 in March.  Continue sliding scale insulin Accu-Cheks diabetic diet.  Hold metformin ?  ?PAF (paroxysmal atrial fibrillation) ([place]) ?Recently diagnosed of 07/25/2021.  Was on Eliquis  as outpatient including beta-blocker.  Beta-blocker currently on hold due to bradycardia.  On IV heparin at this time. ?  ?Essential hypertension ?Continue ARB. ?  ?S/P lumbar fusion ?Spinal surgery March 2023 by Dr. Ronnald Ramp.  No neurological deficits ? ?Hypokalemia.  We will continue to replenish.  Check levels in a.m. ? ?Normocytic anemia ?Latest hemoglobin of 10.7.  Vitamin B12 at 285.  Iron studies within normal range. ?  ?Hypomagnesemia ?Improved after magnesium supplementation.  Magnesium today at 2.4.. ?  ?History of CVA, 1998  ?On Eliquis as outpatient. ?   ? ? DVT prophylaxis:   Heparin drip ? ? ?Code Status:   ?  Code Status: Full Code ? ?Disposition: Home ? ?Status is: Observation ? ?The patient will require care spanning > 2 midnights and should be moved to inpatient because: Need for cardiac catheterization. ? ? Family Communication:  Spoke with the patient's family at bedside ? ?Consultants:  ?Cardiology ? ?Procedures:  ?None ? ?Antimicrobials:  ?None ? ?Anti-infectives (From admission, onward)  ? ? None  ? ?  ? ?Subjective: ?Today, patient was seen and examined at bedside.  Patient denies any chest pain but has some mild leg pain.  Denies any shortness of breath or dyspnea. ? ?Objective: ?Vitals:  ? 07/30/21 1200 07/30/21 1300 07/30/21 1400 07/30/21 1500  ?BP: (!) 191/87 (!) 173/76 (!) 164/65 (!) 155/69  ?Pulse: 65 (!) 51 (!) 54 (!) 54  ?Resp: '19 20 15 20  '$ ?Temp:      ?SpO2: 98% 99% 97% 95%  ?Weight:      ?Height:      ? ? ?  Intake/Output Summary (Last 24 hours) at 07/30/2021 1710 ?Last data filed at 07/30/2021 1701 ?Gross per 24 hour  ?Intake 121.63 ml  ?Output 300 ml  ?Net -178.37 ml  ? ?Filed Weights  ? 07/29/21 2345  ?Weight: 88 kg  ? ? ?Physical Examination: ?Body mass index is 31.79 kg/m?.  ? ?General: Obese built, not in obvious distress ?HENT:   No scleral pallor or icterus noted. Oral mucosa is moist.  ?Chest:  Clear breath sounds.  Diminished breath sounds bilaterally. No crackles or wheezes.   ?CVS: S1 &S2 heard. No murmur.  Regular rate and rhythm. ?Abdomen: Soft, nontender, nondistended.  Bowel sounds are heard.   ?Extremities: No cyanosis, clubbing or edema.  Peripheral pulses are palpable. ?Psych: Alert, awake and oriented, normal mood ?CNS:  No cranial nerve deficits.  Power equal in all extremities.   ?Skin: Warm and dry.  No rashes noted. ? ?Data Reviewed:  ? ?CBC: ?Recent Labs  ?Lab 07/25/21 ?1834 07/29/21 ?2751 07/30/21 ?7001  ?WBC 8.6 8.3 7.0  ?NEUTROABS 5.6  --   --   ?HGB 11.2* 11.7* 10.7*  ?HCT 35.3* 36.6 35.2*  ?MCV 96.4 94.6 100.0  ?PLT 381 344 274  ? ? ?Basic Metabolic Panel: ?Recent Labs  ?Lab 07/25/21 ?7494 07/29/21 ?4967 07/29/21 ?0920 07/30/21 ?5916 07/30/21 ?3846  ?NA 141 142  --  134* 139  ?K 3.2* 4.0  --  3.0* 3.2*  ?CL 113* 114*  --  107 106  ?CO2 20* 20*  --  19* 23  ?GLUCOSE 117* 154*  --  113* 132*  ?BUN 12 13  --  13 14  ?CREATININE 0.89 0.97  --  0.94 1.04*  ?CALCIUM 9.3 9.6  --  8.8* 9.8  ?MG 1.5*  --  1.6*  --  2.4  ? ? ?Liver Function Tests: ?Recent Labs  ?Lab 07/25/21 ?1834 07/29/21 ?0920  ?AST 18 21  ?ALT 21 22  ?ALKPHOS 127* 126  ?BILITOT 0.9 1.1  ?PROT 6.4* 6.7  ?ALBUMIN 3.5 3.8  ? ? ? ?Radiology Studies: ?DG Chest 2 View ? ?Result Date: 07/29/2021 ?CLINICAL DATA:  76 year old female with history of chest pain. EXAM: CHEST - 2 VIEW COMPARISON:  Chest x-ray 07/25/2021. FINDINGS: Lung volumes are low. Areas of interstitial prominence and peribronchial cuffing are noted throughout the mid to lower lungs bilaterally (left-greater-than-right). No confluent consolidative airspace disease. No pleural effusions. No pneumothorax. Small calcified granuloma in the apex of the right upper lobe. No other definite suspicious appearing pulmonary nodules or masses are noted. No evidence of pulmonary edema. Heart size is normal. Upper mediastinal contours are within normal limits. Atherosclerotic calcifications in the thoracic aorta. IMPRESSION: 1. Patchy areas of interstitial  prominence and peribronchial cuffing, most evident in the left mid to lower lung, concerning for bronchitis and potential developing bronchopneumonia. 2. Aortic atherosclerosis. Electronically Signed   By: Vinnie Langton M.D.   On: 07/29/2021 06:37  ? ?CT Angio Chest PE W and/or Wo Contrast ? ?Result Date: 07/29/2021 ?CLINICAL DATA:  Chest pain beginning early this morning. EXAM: CT ANGIOGRAPHY CHEST WITH CONTRAST TECHNIQUE: Multidetector CT imaging of the chest was performed using the standard protocol during bolus administration of intravenous contrast. Multiplanar CT image reconstructions and MIPs were obtained to evaluate the vascular anatomy. RADIATION DOSE REDUCTION: This exam was performed according to the departmental dose-optimization program which includes automated exposure control, adjustment of the mA and/or kV according to patient size and/or use of iterative reconstruction technique. CONTRAST:  187m OMNIPAQUE IOHEXOL 350  MG/ML SOLN COMPARISON:  07/09/2021. FINDINGS: Cardiovascular: Pulmonary arteries are well opacified. There is no evidence of a pulmonary embolism. Heart is mildly enlarged. No pericardial effusion. Right and left coronary artery calcifications. Great vessels are normal caliber. Aortic atherosclerosis. Mediastinum/Nodes: No neck base, mediastinal or hilar masses or enlarged lymph nodes. Trachea and esophagus are unremarkable. Lungs/Pleura: Vascular prominence bilaterally. Linear and patchy opacities in the lower lobes consistent with atelectasis. Bilateral areas of peripheral interstitial thickening. These findings are similar to the prior chest CTA. No convincing pneumonia or pulmonary edema. No pleural effusion or pneumothorax. Mild centrilobular emphysema. Upper Abdomen: No acute abnormality. Musculoskeletal: No fracture or acute finding. No bone lesion. No chest wall mass. Review of the MIP images confirms the above findings. IMPRESSION: 1. No evidence of a pulmonary embolism. 2.  No acute findings. 3. Chronic lung findings including peripheral interstitial thickening with some reticulation suggesting interstitial fibrosis. Mild basilar atelectasis and/or scarring. Mild emphysema. Aortic A

## 2021-07-30 NOTE — Evaluation (Signed)
Physical Therapy Evaluation ?Patient Details ?Name: Debbie [name] ?MRN: [id number] ?DOB: 07/11/1945 ?Today's Date: 07/30/2021 ? ?History of Present Illness ? Pt is a 76 y/o female admitted secondary to chest pain. Troponins negative; workup pending. Pt with recent Lumbar fusion in 06/2021. PMH includes a fib, CVA, DM, CAD, and CHF.  ?Clinical Impression ? Pt admitted secondary to problem above with deficits below. Pt requiring min A for mobility tasks this session. Only able to walk a short distance before requesting to sit secondary to pain. Per pt, husband available to assist as needed and has been having HHPT/HHOT/HHRN come to home since last d/c from hospital. Recommending resumption of [place] services at d/c. Will continue to follow acutely.    ?   ? ?Recommendations for follow up therapy are one component of a multi-disciplinary discharge planning process, led by the attending physician.  Recommendations may be updated based on patient status, additional functional criteria and insurance authorization. ? ?Follow Up Recommendations Home health PT ? ?  ?Assistance Recommended at Discharge Frequent or constant Supervision/Assistance  ?Patient can return home with the following ? A little help with walking and/or transfers;A little help with bathing/dressing/bathroom;Help with stairs or ramp for entrance;Assist for transportation;Assistance with cooking/housework ? ?  ?Equipment Recommendations None recommended by PT  ?Recommendations for Other Services ?    ?  ?Functional Status Assessment Patient has had a recent decline in their functional status and demonstrates the ability to make significant improvements in function in a reasonable and predictable amount of time.  ? ?  ?Precautions / Restrictions Precautions ?Precautions: Back ?Precaution Booklet Issued: No ?Restrictions ?Weight Bearing Restrictions: No  ? ?  ? ?Mobility ? Bed Mobility ?Overal bed mobility: Needs Assistance ?Bed Mobility: Supine to Sit, Sit to  Supine ?  ?  ?Supine to sit: Min assist ?Sit to supine: Min assist ?  ?General bed mobility comments: Required assist for LE and trunk assist. ?  ? ?Transfers ?Overall transfer level: Needs assistance ?Equipment used: 1 person hand held assist ?Transfers: Sit to/from Stand ?Sit to Stand: Min assist ?  ?  ?  ?  ?  ?General transfer comment: Min A For lift assist and steadying. REquiring 2 attempts to stand secondary to knee pain. ?  ? ?Ambulation/Gait ?Ambulation/Gait assistance: Min assist ?Gait Distance (Feet): 2 Feet ?Assistive device: 1 person hand held assist ?Gait Pattern/deviations: Step-through pattern, Decreased stride length ?Gait velocity: Decreased ?  ?  ?General Gait Details: Pt holding to PT arms for support throughout. Min A for steadying to take steps forward and back at stretcher. Pt reporting increased pain in knees and requesting to return to sitting. ? ?Stairs ?  ?  ?  ?  ?  ? ?Wheelchair Mobility ?  ? ?Modified Rankin (Stroke Patients Only) ?  ? ?  ? ?Balance Overall balance assessment: Needs assistance ?Sitting-balance support: Feet supported, No upper extremity supported ?Sitting balance-Leahy Scale: Fair ?  ?  ?Standing balance support: Bilateral upper extremity supported ?Standing balance-Leahy Scale: Poor ?Standing balance comment: Reliant on BUE support ?  ?  ?  ?  ?  ?  ?  ?  ?  ?  ?  ?   ? ? ? ?Pertinent Vitals/Pain Pain Assessment ?Pain Assessment: Faces ?Faces Pain Scale: Hurts even more ?Pain Location: bilateral knees ?Pain Descriptors / Indicators: Aching, Grimacing, Guarding ?Pain Intervention(s): Limited activity within patient's tolerance, Monitored during session, Repositioned  ? ? ?Home Living Family/patient expects to be discharged to:: Private residence ?Living  Arrangements: Spouse/significant other ?Available Help at Discharge: Family;Available 24 hours/day ?Type of Home: House ?Home Access: Stairs to enter ?Entrance Stairs-Rails: None ?Entrance Stairs-Number of Steps: 1-2 ?   ?Home Layout: One level ?Home Equipment: Conservation officer, nature (2 wheels);Cane - single point;BSC/3in1;Shower seat;Grab bars - tub/shower ?   ?  ?Prior Function Prior Level of Function : Independent/Modified Independent ?  ?  ?  ?  ?  ?  ?Mobility Comments: Uses RW since back surgery ?  ?  ? ? ?Hand Dominance  ?   ? ?  ?Extremity/Trunk Assessment  ? Upper Extremity Assessment ?Upper Extremity Assessment: Defer to OT evaluation ?  ? ?Lower Extremity Assessment ?Lower Extremity Assessment: Generalized weakness (reporting bilateral knee pain) ?  ? ?Cervical / Trunk Assessment ?Cervical / Trunk Assessment: Back Surgery  ?Communication  ? Communication: No difficulties  ?Cognition Arousal/Alertness: Awake/alert ?Behavior During Therapy: [facility] for tasks assessed/performed ?Overall Cognitive Status: History of cognitive impairments - at baseline ?  ?  ?  ?  ?  ?  ?  ?  ?  ?  ?  ?  ?  ?  ?  ?  ?General Comments: memory impairments at baseline ?  ?  ? ?  ?General Comments General comments (skin integrity, edema, etc.): VSS. Pt asymptomatic throughout. ? ?  ?Exercises    ? ?Assessment/Plan  ?  ?PT Assessment Patient needs continued PT services  ?PT Problem List Decreased strength;Decreased activity tolerance;Decreased balance;Decreased mobility;Decreased knowledge of use of DME;Decreased safety awareness;Cardiopulmonary status limiting activity;Decreased knowledge of precautions;Impaired sensation;Pain ? ?   ?  ?PT Treatment Interventions DME instruction;Stair training;Gait training;Functional mobility training;Therapeutic activities;Therapeutic exercise;Balance training;Patient/family education   ? ?PT Goals (Current goals can be found in the Care Plan section)  ?Acute Rehab PT Goals ?Patient Stated Goal: to go home ?PT Goal Formulation: With patient ?Time For Goal Achievement: 08/13/21 ?Potential to Achieve Goals: Good ? ?  ?Frequency Min 3X/week ?  ? ? ?Co-evaluation   ?  ?  ?  ?  ? ? ?  ?AM-PAC PT "6 Clicks" Mobility  ?Outcome  Measure Help needed turning from your back to your side while in a flat bed without using bedrails?: A Little ?Help needed moving from lying on your back to sitting on the side of a flat bed without using bedrails?: A Little ?Help needed moving to and from a bed to a chair (including a wheelchair)?: A Little ?Help needed standing up from a chair using your arms (e.g., wheelchair or bedside chair)?: A Little ?Help needed to walk in hospital room?: A Little ?Help needed climbing 3-5 steps with a railing? : A Lot ?6 Click Score: 17 ? ?  ?End of Session Equipment Utilized During Treatment: Gait belt ?Activity Tolerance: Patient limited by pain ?Patient left: in bed;with call bell/phone within reach;with family/visitor present (on stretcher in ED) ?Nurse Communication: Mobility status ?PT Visit Diagnosis: Unsteadiness on feet (R26.81);Pain ?Pain - part of body:  (bilat knees) ?  ? ?Time: 5916-3846 ?PT Time Calculation (min) (ACUTE ONLY): 15 min ? ? ?Charges:   PT Evaluation ?$PT Eval Moderate Complexity: 1 Mod ?  ?  ?   ? ? ?Reuel Derby, PT, DPT  ?Acute Rehabilitation Services  ?Pager: [phone] ?Office: [phone] ? ? ?[place] ?07/30/2021, 8:50 AM ?

## 2021-07-30 NOTE — Care Management Obs Status (Signed)
MEDICARE OBSERVATION STATUS NOTIFICATION ? ? ?Patient Details  ?Name: Debbie [name] ?MRN: [id number] ?Date of Birth: July 19, 1945 ? ? ?Medicare Observation Status Notification Given:  Yes ? ? ? Laurena Slimmer, RN ?07/30/2021, 8:45 PM ?

## 2021-07-30 NOTE — Hospital Course (Addendum)
Debbie [name] is a 76 y.o. female with past medical history of diabetes mellitus type 2, hypertension, history of stroke, CAD and history of lumbar spine surgery including lumbar laminectomy, fusion and fixation on 07/04/2021 by Dr. Ronnald Ramp, who was hospitalized on 07/09/2021 with chest pain and shortness of breath.  At that time, she was thought to be having volume overload and discharged on 3/28.  Patient subsequently presented on 07/25/2021 with shortness of breath and chest pain and was noted to have atrial fibrillation which was converted to sinus while in the ED.  Cardiology had seen the patient at that time and metoprolol dose was increased and she was switched to Eliquis.  At this time, patient presented with pressure-like sensation in the central part of the chest which improved with sublingual nitro.  EMS was called in and patient was brought into the hospital.  Cardiology was consulted.  CT angiogram of the chest did not show any pulmonary embolism.  Patient was then admitted hospital for further evaluation and treatment. ? ?Assessment and plan ? ?Principal Problem: ?  Chest pain ?Active Problems: ?  Coronary artery disease involving native coronary artery of native heart ?  Type 2 diabetes mellitus without complication, without long-term current use of insulin ([place]) ?  PAF (paroxysmal atrial fibrillation) ([place]) ?  Essential hypertension ?  S/P lumbar fusion ?  History of CVA, 1998  ?  Hypomagnesemia ?  Normocytic anemia ?  ?Chest pain/pressure likely unstable angina. ?Patient had mildly elevated troponin.  History of recent atrial fibrillation on metoprolol.  Chest pressure improved with nitroglycerin.  CTA chest without any PE.  Chest x-ray showed chronic interstitial findings.  No concern for pneumonia.  Cardiology was consulted and recommended cardiac catheterization.  Patient underwent cardiac catheterization on 07/31/2021 with findings of 80% proximal RCA lesion and a stent was placed in.  Cardiology  recommends triple therapy for 1 month followed by Plavix and Eliquis.   ? ?Coronary artery disease ?History of CAD and PCI.  On Eliquis since 07/25/2021.  Status post PCI 07/31/2021.  On low-dose Toprol, continue statins.  Continue dual antiplatelets. ? ?Type 2 diabetes mellitus without complication, without long-term current use of insulin ([place]) ?HbA1c of 7.1 in March.  Continue diabetic diet and resume metformin after discharge. ?  ?PAF (paroxysmal atrial fibrillation) ([place]) ?Recently diagnosed of 07/25/2021.  Was on Eliquis as outpatient including beta-blocker.  Beta-blocker dose was decreased.  Will be resumed on Eliquis on discharge ? ?Essential hypertension ?Continue ARB.  Dose of beta-blocker has been decreased.  Amlodipine has been added instead for adequate blood pressure control. ?  ?Hypokalemia.  Improved after replacement.  Latest potassium of 0.8. ? ?Normocytic anemia ?Latest hemoglobin of 10.7.  Vitamin B12 at 285.  Iron studies within normal range. ?  ?Hypomagnesemia ?Improved after replacement. ?  ?History of CVA, 1998  ?On Eliquis as outpatient.  We will continue on discharge. ? ?S/P lumbar fusion with leg pain. ?Spinal surgery March 2023 by Dr. Ronnald Ramp.  No neurological deficits continues to complain of bilateral anterior leg pain.  Advised to continue taking Neurontin.  Might need dose adjustment.  Spoke with the patient husband reported.  Seen by physical therapy and Occupational Therapy recommend home health PT on discharge. ?  ?

## 2021-07-30 NOTE — H&P (Signed)
?  RCA stents x 3 2001 ?If she requires percutaneous revascularization and receives a stent, she will need to be on aspirin and clopidogrel in addition to Eliquis for 30 days, then discontinue the aspirin and remain on clopidogrel for 6-12 months.  Avoid Brilinta due to the need for full anticoagulation.  CHA2DS2-VASc score is 9. ?PAF with dyspnea and CP. ?

## 2021-07-30 NOTE — Progress Notes (Addendum)
ANTICOAGULATION CONSULT NOTE - Follow-Up ? ?Pharmacy Consult for heparin ?Indication: atrial fibrillation ? ?No Known Allergies ? ?Patient Measurements: ?Height: 5' 5.5" (166.4 cm) ?Weight: 88 kg (194 lb 0.1 oz) ?IBW/kg (Calculated) : 58.15 ?Heparin Dosing Weight: 77 Kg ? ?Vital Signs: ?BP: 155/69 (04/17 1500) ?Pulse Rate: 54 (04/17 1500) ? ?Labs: ?Recent Labs  ?  07/29/21 ?0449 07/29/21 ?0920 07/29/21 ?1344 07/30/21 ?0508 07/30/21 ?0713 07/30/21 ?1515  ?HGB 11.7*  --   --  10.7*  --   --   ?HCT 36.6  --   --  35.2*  --   --   ?PLT 344  --   --  274  --   --   ?APTT  --   --   --   --   --  196*  ?HEPARINUNFRC  --   --   --   --   --  >1.10*  ?CREATININE 0.97  --   --  0.94 1.04*  --   ?CKTOTAL  --  56  --   --   --   --   ?TROPONINIHS 37* 35* 33*  --   --   --   ? ? ? ?Estimated Creatinine Clearance: 50.1 mL/min (A) (by C-G formula based on SCr of 1.04 mg/dL (H)). ? ? ?Medical History: ?Past Medical History:  ?Diagnosis Date  ? Arthritis   ? CAD (coronary artery disease) 11/1999  ? RCA stent X3, patent '06. Nuc low risk 9/13  ? Cataract   ? beginning stages  ? Diabetes mellitus without complication ([place])   ? Dysrhythmia   ? History of stress test 01/08/2012  ? Showed minimal anterior thinning not felt to be significant.  ? Hx of echocardiogram 12/12/2011  ? EF>55%  ? Hypercholesteremia   ? Hypertension   ? Myocardial infarction [facility])   ? 65 years ago    age 1  ? PAF (paroxysmal atrial fibrillation) ([place]) 11/2011  ? SSS component with some bradycardia  ? Stroke [facility]) 1998  ? Vitamin D deficiency   ? ? ?Medications:  ?Scheduled:  ? ezetimibe  10 mg Oral Daily  ? gabapentin  300 mg Oral BID  ? insulin aspart  0-15 Units Subcutaneous TID WC  ? irbesartan  150 mg Oral Daily  ? metoprolol succinate  50 mg Oral Once  ? mirabegron ER  50 mg Oral Daily  ? multivitamin with minerals  1 tablet Oral Daily  ? rosuvastatin  5 mg Oral Daily  ? sodium chloride flush  3 mL Intravenous Q12H  ? ? ?Assessment: 76 y.o. female being  transitioned from apixaban to heparin in preparation for cardiac catheterization. Last dose of Eliquis 4/16 @ 1828.  ? ?HL / aPTT 4/17 @ 1515 is >1.1 and 196. Level appears to have been drawn correctly after discussing with RN. No issues noted with infusion. ? ?Hgb 10.7; plt 274 ? ?Goal of Therapy:  ?aPTT 66-102 seconds ?Monitor platelets by anticoagulation protocol: Yes ?  ?Plan:  ?Hold heparin infusion for 1 hour ?Re-start heparin at 800 units/hr @ 1830 ?Check aptt/ anti-Xa level in 8 hours and daily while on heparin ?Continue to monitor H&H and platelets ? ?Lorelei Pont, PharmD, BCPS ?07/30/2021 5:03 PM ?ED Clinical Pharmacist -  [phone] ? ? ? ? ?

## 2021-07-30 NOTE — Evaluation (Signed)
Occupational Therapy Evaluation ?Patient Details ?Name: Debbie [name] ?MRN: [id number] ?DOB: 09/17/1945 ?Today's Date: 07/30/2021 ? ? ?History of Present Illness Pt is a 76 y/o female admitted secondary to chest pain. Troponins negative; workup pending. Pt with recent Lumbar fusion in 06/2021. PMH includes a fib, CVA, DM, CAD, and CHF.  ? ?Clinical Impression ?  ?PTA patient reports independent with ADLs, but needing assist for socks/shoes from spouse, using RW for mobility and having assist for IADLs.  Pt admitted for above and limited by problem list below, including weakness, B knee pain, decreased activity tolerance.  She has hx of cognitive deficits at baseline, but able to engage and follow commands appropriately. She completes transfers with min assist, Adls with min to mod assist, limited mobility in room using RW due to B knee pain and fatigue.  Will follow acutely, recommend continued OT after dc at [facility] level (resume [place]).  ?   ? ?Recommendations for follow up therapy are one component of a multi-disciplinary discharge planning process, led by the attending physician.  Recommendations may be updated based on patient status, additional functional criteria and insurance authorization.  ? ?Follow Up Recommendations ? Home health OT  ?  ?Assistance Recommended at Discharge Intermittent Supervision/Assistance  ?Patient can return home with the following A little help with walking and/or transfers;A little help with bathing/dressing/bathroom;Assistance with cooking/housework;Direct supervision/assist for medications management;Direct supervision/assist for financial management;Assist for transportation;Help with stairs or ramp for entrance ? ?  ?Functional Status Assessment ? Patient has had a recent decline in their functional status and demonstrates the ability to make significant improvements in function in a reasonable and predictable amount of time.  ?Equipment Recommendations ? None recommended by OT  ?   ?Recommendations for Other Services   ? ? ?  ?Precautions / Restrictions Precautions ?Precautions: Back ?Precaution Booklet Issued: No ?Restrictions ?Weight Bearing Restrictions: No  ? ?  ? ?Mobility Bed Mobility ?Overal bed mobility: Needs Assistance ?Bed Mobility: Supine to Sit, Sit to Supine ?  ?  ?Supine to sit: Min assist ?Sit to supine: Min assist ?  ?General bed mobility comments: Required assist for LE and trunk assist. ?  ? ?Transfers ?  ?  ?  ?  ?  ?  ?  ?  ?  ?  ?  ? ?  ?Balance Overall balance assessment: Needs assistance ?Sitting-balance support: Feet supported, No upper extremity supported ?Sitting balance-Leahy Scale: Fair ?  ?  ?Standing balance support: Bilateral upper extremity supported ?Standing balance-Leahy Scale: Poor ?Standing balance comment: Reliant on BUE support ?  ?  ?  ?  ?  ?  ?  ?  ?  ?  ?  ?   ? ?ADL either performed or assessed with clinical judgement  ? ?ADL Overall ADL's : Needs assistance/impaired ?  ?  ?Grooming: Set up;Sitting ?  ?  ?  ?  ?  ?Upper Body Dressing : Set up;Sitting ?  ?Lower Body Dressing: Moderate assistance;Sit to/from stand ?Lower Body Dressing Details (indicate cue type and reason): assist for socks ?Toilet Transfer: Minimal assistance;Ambulation;Rolling walker (2 wheels) ?  ?  ?  ?  ?  ?Functional mobility during ADLs: Minimal assistance;Rolling walker (2 wheels);Cueing for safety ?   ? ? ? ?Vision   ?Vision Assessment?: No apparent visual deficits  ?   ?Perception   ?  ?Praxis   ?  ? ?Pertinent Vitals/Pain Pain Assessment ?Pain Assessment: Faces ?Faces Pain Scale: Hurts even more ?Pain Location: bilateral knees ?  Pain Descriptors / Indicators: Aching, Grimacing, Guarding ?Pain Intervention(s): Limited activity within patient's tolerance, Monitored during session, Repositioned  ? ? ? ?Hand Dominance Right ?  ?Extremity/Trunk Assessment Upper Extremity Assessment ?Upper Extremity Assessment: Generalized weakness ?  ?Lower Extremity Assessment ?Lower Extremity  Assessment: Defer to PT evaluation ?  ?Cervical / Trunk Assessment ?Cervical / Trunk Assessment: Back Surgery ?  ?Communication Communication ?Communication: No difficulties ?  ?Cognition Arousal/Alertness: Awake/alert ?Behavior During Therapy: [facility] for tasks assessed/performed ?Overall Cognitive Status: History of cognitive impairments - at baseline ?  ?  ?  ?  ?  ?  ?  ?  ?  ?  ?  ?  ?  ?  ?  ?  ?General Comments: memory impairments at baseline, engages appropriately ?  ?  ?General Comments  VSS ? ?  ?Exercises   ?  ?Shoulder Instructions    ? ? ?Home Living Family/patient expects to be discharged to:: Private residence ?Living Arrangements: Spouse/significant other ?Available Help at Discharge: Family;Available 24 hours/day ?Type of Home: House ?Home Access: Stairs to enter ?Entrance Stairs-Number of Steps: 1-2 ?Entrance Stairs-Rails: None ?Home Layout: One level ?  ?  ?Bathroom Shower/Tub: Tub/shower unit ?  ?Bathroom Toilet: Handicapped height ?  ?  ?Home Equipment: Conservation officer, nature (2 wheels);Cane - single point;BSC/3in1;Shower seat;Grab bars - tub/shower ?  ?  ?  ? ?  ?Prior Functioning/Environment Prior Level of Function : Independent/Modified Independent ?  ?  ?  ?  ?  ?  ?Mobility Comments: Uses RW since back surgery ?ADLs Comments: reports independent most ADLs, has to have assist for LB shoes/socks from spouse; spouse completes IADLS ?  ? ?  ?  ?OT Problem List: Decreased strength;Decreased activity tolerance;Impaired balance (sitting and/or standing);Decreased cognition;Decreased safety awareness;Decreased coordination;Decreased knowledge of use of DME or AE;Cardiopulmonary status limiting activity;Decreased knowledge of precautions;Pain;Increased edema ?  ?   ?OT Treatment/Interventions: Self-care/ADL training;Therapeutic exercise;Therapeutic activities;Cognitive remediation/compensation;Patient/family education;Balance training;DME and/or AE instruction  ?  ?OT Goals(Current goals can be found in the  care plan section) Acute Rehab OT Goals ?Patient Stated Goal: get better ?OT Goal Formulation: With patient ?Time For Goal Achievement: 08/13/21 ?Potential to Achieve Goals: Good  ?OT Frequency: Min 2X/week ?  ? ?Co-evaluation   ?  ?  ?  ?  ? ?  ?AM-PAC OT "6 Clicks" Daily Activity     ?Outcome Measure Help from another person eating meals?: A Little ?Help from another person taking care of personal grooming?: A Little ?Help from another person toileting, which includes using toliet, bedpan, or urinal?: A Little ?Help from another person bathing (including washing, rinsing, drying)?: A Lot ?Help from another person to put on and taking off regular upper body clothing?: A Little ?Help from another person to put on and taking off regular lower body clothing?: A Lot ?6 Click Score: 16 ?  ?End of Session Equipment Utilized During Treatment: Rolling walker (2 wheels) ?Nurse Communication: Mobility status ? ?Activity Tolerance: Patient tolerated treatment well ?Patient left: in bed;with call bell/phone within reach;with family/visitor present ? ?OT Visit Diagnosis: Unsteadiness on feet (R26.81);Other abnormalities of gait and mobility (R26.89);Muscle weakness (generalized) (M62.81);Pain ?Pain - Right/Left:  (bil) ?Pain - part of body: Knee  ?              ?Time: 8242-3536 ?OT Time Calculation (min): 12 min ?Charges:  OT General Charges ?$OT Visit: 1 Visit ?OT Evaluation ?$OT Eval Moderate Complexity: 1 Mod ? ?Jolaine Artist, OT ?Acute Rehabilitation Services ?Pager [phone] ?Office  [phone] ? ? ?Delight Stare ?07/30/2021, 9:55 AM ?

## 2021-07-31 ENCOUNTER — Inpatient Hospital Stay (HOSPITAL_COMMUNITY)
Admission: EM | Disposition: A | Discharge status: Home Health | Payer: Self-pay | Source: Home / Self Care | Attending: Internal Medicine

## 2021-07-31 ENCOUNTER — Ambulatory Visit (HOSPITAL_COMMUNITY): Payer: Medicare PPO | Admitting: Physician Assistant

## 2021-07-31 DIAGNOSIS — E7800 Pure hypercholesterolemia, unspecified: Secondary | ICD-10-CM | POA: Diagnosis present

## 2021-07-31 DIAGNOSIS — Z7984 Long term (current) use of oral hypoglycemic drugs: Secondary | ICD-10-CM | POA: Diagnosis not present

## 2021-07-31 DIAGNOSIS — H269 Unspecified cataract: Secondary | ICD-10-CM | POA: Diagnosis present

## 2021-07-31 DIAGNOSIS — I119 Hypertensive heart disease without heart failure: Secondary | ICD-10-CM | POA: Diagnosis not present

## 2021-07-31 DIAGNOSIS — I25110 Atherosclerotic heart disease of native coronary artery with unstable angina pectoris: Secondary | ICD-10-CM | POA: Diagnosis present

## 2021-07-31 DIAGNOSIS — I110 Hypertensive heart disease with heart failure: Secondary | ICD-10-CM | POA: Diagnosis present

## 2021-07-31 DIAGNOSIS — Z8673 Personal history of transient ischemic attack (TIA), and cerebral infarction without residual deficits: Secondary | ICD-10-CM | POA: Diagnosis not present

## 2021-07-31 DIAGNOSIS — Z95820 Peripheral vascular angioplasty status with implants and grafts: Secondary | ICD-10-CM | POA: Diagnosis not present

## 2021-07-31 DIAGNOSIS — Z833 Family history of diabetes mellitus: Secondary | ICD-10-CM | POA: Diagnosis not present

## 2021-07-31 DIAGNOSIS — E876 Hypokalemia: Secondary | ICD-10-CM | POA: Diagnosis present

## 2021-07-31 DIAGNOSIS — E119 Type 2 diabetes mellitus without complications: Secondary | ICD-10-CM | POA: Diagnosis present

## 2021-07-31 DIAGNOSIS — I200 Unstable angina: Secondary | ICD-10-CM | POA: Diagnosis not present

## 2021-07-31 DIAGNOSIS — I5032 Chronic diastolic (congestive) heart failure: Secondary | ICD-10-CM | POA: Diagnosis present

## 2021-07-31 DIAGNOSIS — M1990 Unspecified osteoarthritis, unspecified site: Secondary | ICD-10-CM | POA: Diagnosis present

## 2021-07-31 DIAGNOSIS — I25118 Atherosclerotic heart disease of native coronary artery with other forms of angina pectoris: Secondary | ICD-10-CM

## 2021-07-31 DIAGNOSIS — I252 Old myocardial infarction: Secondary | ICD-10-CM | POA: Diagnosis not present

## 2021-07-31 DIAGNOSIS — I495 Sick sinus syndrome: Secondary | ICD-10-CM | POA: Diagnosis present

## 2021-07-31 DIAGNOSIS — I10 Essential (primary) hypertension: Secondary | ICD-10-CM | POA: Diagnosis not present

## 2021-07-31 DIAGNOSIS — Z981 Arthrodesis status: Secondary | ICD-10-CM | POA: Diagnosis not present

## 2021-07-31 DIAGNOSIS — Z79899 Other long term (current) drug therapy: Secondary | ICD-10-CM | POA: Diagnosis not present

## 2021-07-31 DIAGNOSIS — R079 Chest pain, unspecified: Secondary | ICD-10-CM | POA: Diagnosis not present

## 2021-07-31 DIAGNOSIS — I2510 Atherosclerotic heart disease of native coronary artery without angina pectoris: Secondary | ICD-10-CM | POA: Diagnosis not present

## 2021-07-31 DIAGNOSIS — D649 Anemia, unspecified: Secondary | ICD-10-CM | POA: Diagnosis present

## 2021-07-31 DIAGNOSIS — Z7901 Long term (current) use of anticoagulants: Secondary | ICD-10-CM | POA: Diagnosis not present

## 2021-07-31 DIAGNOSIS — Z955 Presence of coronary angioplasty implant and graft: Secondary | ICD-10-CM | POA: Diagnosis not present

## 2021-07-31 DIAGNOSIS — E785 Hyperlipidemia, unspecified: Secondary | ICD-10-CM | POA: Diagnosis not present

## 2021-07-31 DIAGNOSIS — I480 Paroxysmal atrial fibrillation: Secondary | ICD-10-CM | POA: Diagnosis present

## 2021-07-31 DIAGNOSIS — Z90710 Acquired absence of both cervix and uterus: Secondary | ICD-10-CM | POA: Diagnosis not present

## 2021-07-31 DIAGNOSIS — Z8249 Family history of ischemic heart disease and other diseases of the circulatory system: Secondary | ICD-10-CM | POA: Diagnosis not present

## 2021-07-31 DIAGNOSIS — R0789 Other chest pain: Secondary | ICD-10-CM | POA: Diagnosis present

## 2021-07-31 HISTORY — PX: CORONARY STENT INTERVENTION: CATH118234

## 2021-07-31 HISTORY — PX: LEFT HEART CATH AND CORONARY ANGIOGRAPHY: CATH118249

## 2021-07-31 LAB — CBC
HCT: 33.8 % — ABNORMAL LOW (ref 36.0–46.0)
Hemoglobin: 10.9 g/dL — ABNORMAL LOW (ref 12.0–15.0)
MCH: 30.1 pg (ref 26.0–34.0)
MCHC: 32.2 g/dL (ref 30.0–36.0)
MCV: 93.4 fL (ref 80.0–100.0)
Platelets: 287 10*3/uL (ref 150–400)
RBC: 3.62 MIL/uL — ABNORMAL LOW (ref 3.87–5.11)
RDW: 14.9 % (ref 11.5–15.5)
WBC: 6.6 10*3/uL (ref 4.0–10.5)
nRBC: 0 % (ref 0.0–0.2)

## 2021-07-31 LAB — APTT: aPTT: 112 seconds — ABNORMAL HIGH (ref 24–36)

## 2021-07-31 LAB — BASIC METABOLIC PANEL
Anion gap: 11 (ref 5–15)
BUN: 10 mg/dL (ref 8–23)
CO2: 22 mmol/L (ref 22–32)
Calcium: 9.4 mg/dL (ref 8.9–10.3)
Chloride: 106 mmol/L (ref 98–111)
Creatinine, Ser: 0.97 mg/dL (ref 0.44–1.00)
GFR, Estimated: >60 mL/min (ref >60)
Glucose, Bld: 135 mg/dL — ABNORMAL HIGH (ref 70–99)
Potassium: 4 mmol/L (ref 3.5–5.1)
Sodium: 139 mmol/L (ref 135–145)

## 2021-07-31 LAB — GLUCOSE, CAPILLARY
Glucose-Capillary: 132 mg/dL — ABNORMAL HIGH (ref 70–99)
Glucose-Capillary: 106 mg/dL — ABNORMAL HIGH (ref 70–99)
Glucose-Capillary: 100 mg/dL — ABNORMAL HIGH (ref 70–99)
Glucose-Capillary: 111 mg/dL — ABNORMAL HIGH (ref 70–99)

## 2021-07-31 LAB — POCT ACTIVATED CLOTTING TIME: Activated Clotting Time: 275 seconds

## 2021-07-31 LAB — HEPARIN LEVEL (UNFRACTIONATED): Heparin Unfractionated: >1.1 IU/mL — ABNORMAL HIGH (ref 0.30–0.70)

## 2021-07-31 SURGERY — LEFT HEART CATH AND CORONARY ANGIOGRAPHY
Anesthesia: LOCAL

## 2021-07-31 MED ORDER — HEPARIN SODIUM (PORCINE) 1000 UNIT/ML IJ SOLN
INTRAMUSCULAR | Status: DC | PRN
  Administered 2021-07-31: 2000 [IU] via INTRAVENOUS
  Administered 2021-07-31: 5000 [IU] via INTRAVENOUS
  Administered 2021-07-31: 4300 [IU] via INTRAVENOUS
  Administered 2021-07-31: 2000 [IU] via INTRAVENOUS

## 2021-07-31 MED ORDER — AMLODIPINE BESYLATE 5 MG PO TABS
5.0000 mg | ORAL_TABLET | Freq: Every day | ORAL | Status: DC
  Administered 2021-07-31 – 2021-08-01 (×2): 5 mg via ORAL
  Filled 2021-07-31 (×2): qty 1

## 2021-07-31 MED ORDER — LABETALOL HCL 5 MG/ML IV SOLN: 10.0000 mg | INTRAVENOUS | Status: AC | PRN

## 2021-07-31 MED ORDER — SODIUM CHLORIDE 0.9 % IV SOLN: INTRAVENOUS | Status: DC

## 2021-07-31 MED ORDER — FENTANYL CITRATE (PF) 100 MCG/2ML IJ SOLN
INTRAMUSCULAR | Status: DC | PRN
  Administered 2021-07-31 (×2): 25 ug via INTRAVENOUS

## 2021-07-31 MED ORDER — HYDRALAZINE HCL 20 MG/ML IJ SOLN
INTRAMUSCULAR | Status: DC | PRN
  Administered 2021-07-31: 10 mg via INTRAVENOUS

## 2021-07-31 MED ORDER — MIDAZOLAM HCL 2 MG/2ML IJ SOLN
INTRAMUSCULAR | Status: DC | PRN
  Administered 2021-07-31: 2 mg via INTRAVENOUS
  Administered 2021-07-31: 1 mg via INTRAVENOUS

## 2021-07-31 MED ORDER — LIDOCAINE HCL (PF) 1 % IJ SOLN
INTRAMUSCULAR | Status: DC | PRN
  Administered 2021-07-31: 2 mL via INTRADERMAL

## 2021-07-31 MED ORDER — MIDAZOLAM HCL 2 MG/2ML IJ SOLN
INTRAMUSCULAR | Status: AC
  Filled 2021-07-31: qty 2

## 2021-07-31 MED ORDER — FAMOTIDINE IN NACL 20-0.9 MG/50ML-% IV SOLN
INTRAVENOUS | Status: AC
Start: 2021-07-31 — End: ?
  Filled 2021-07-31: qty 50

## 2021-07-31 MED ORDER — IOHEXOL 350 MG/ML SOLN
INTRAVENOUS | Status: DC | PRN
  Administered 2021-07-31: 130 mL

## 2021-07-31 MED ORDER — ONDANSETRON HCL 4 MG/2ML IJ SOLN: 4.0000 mg | Freq: Four times a day (QID) | INTRAMUSCULAR | Status: DC | PRN

## 2021-07-31 MED ORDER — SODIUM CHLORIDE 0.9% FLUSH
3.0000 mL | Freq: Two times a day (BID) | INTRAVENOUS | Status: DC
  Administered 2021-08-01: 3 mL via INTRAVENOUS

## 2021-07-31 MED ORDER — HEPARIN (PORCINE) IN NACL 1000-0.9 UT/500ML-% IV SOLN
INTRAVENOUS | Status: AC
  Filled 2021-07-31: qty 1000

## 2021-07-31 MED ORDER — CLOPIDOGREL BISULFATE 300 MG PO TABS
ORAL_TABLET | ORAL | Status: AC
  Filled 2021-07-31: qty 2

## 2021-07-31 MED ORDER — HEPARIN SODIUM (PORCINE) 1000 UNIT/ML IJ SOLN
INTRAMUSCULAR | Status: AC
Start: 2021-07-31 — End: ?
  Filled 2021-07-31: qty 10

## 2021-07-31 MED ORDER — CLOPIDOGREL BISULFATE 300 MG PO TABS
ORAL_TABLET | ORAL | Status: DC | PRN
  Administered 2021-07-31: 600 mg via ORAL

## 2021-07-31 MED ORDER — APIXABAN 5 MG PO TABS
5.0000 mg | ORAL_TABLET | Freq: Two times a day (BID) | ORAL | Status: DC
  Administered 2021-08-01: 5 mg via ORAL
  Filled 2021-07-31: qty 1

## 2021-07-31 MED ORDER — LIDOCAINE HCL (PF) 1 % IJ SOLN
INTRAMUSCULAR | Status: AC
  Filled 2021-07-31: qty 30

## 2021-07-31 MED ORDER — SODIUM CHLORIDE 0.9 % IV SOLN: 250.0000 mL | INTRAVENOUS | Status: DC | PRN

## 2021-07-31 MED ORDER — ASPIRIN 81 MG PO CHEW
81.0000 mg | CHEWABLE_TABLET | Freq: Every day | ORAL | Status: DC
  Administered 2021-08-01: 81 mg via ORAL
  Filled 2021-07-31: qty 1

## 2021-07-31 MED ORDER — NITROGLYCERIN 1 MG/10 ML FOR IR/CATH LAB
INTRA_ARTERIAL | Status: DC | PRN
  Administered 2021-07-31 (×2): 200 ug via INTRACORONARY

## 2021-07-31 MED ORDER — FAMOTIDINE IN NACL 20-0.9 MG/50ML-% IV SOLN
INTRAVENOUS | Status: AC | PRN
  Administered 2021-07-31: 20 mg via INTRAVENOUS

## 2021-07-31 MED ORDER — HEPARIN SODIUM (PORCINE) 1000 UNIT/ML IJ SOLN
INTRAMUSCULAR | Status: AC
  Filled 2021-07-31: qty 10

## 2021-07-31 MED ORDER — SODIUM CHLORIDE 0.9% FLUSH: 3.0000 mL | INTRAVENOUS | Status: DC | PRN

## 2021-07-31 MED ORDER — CLOPIDOGREL BISULFATE 75 MG PO TABS
75.0000 mg | ORAL_TABLET | Freq: Every day | ORAL | Status: DC
  Administered 2021-08-01: 75 mg via ORAL
  Filled 2021-07-31: qty 1

## 2021-07-31 MED ORDER — FENTANYL CITRATE (PF) 100 MCG/2ML IJ SOLN
INTRAMUSCULAR | Status: AC
  Filled 2021-07-31: qty 2

## 2021-07-31 MED ORDER — MIDAZOLAM HCL 2 MG/2ML IJ SOLN
INTRAMUSCULAR | Status: AC
Start: 2021-07-31 — End: ?
  Filled 2021-07-31: qty 2

## 2021-07-31 MED ORDER — HYDRALAZINE HCL 20 MG/ML IJ SOLN
INTRAMUSCULAR | Status: AC
  Filled 2021-07-31: qty 1

## 2021-07-31 MED ORDER — VERAPAMIL HCL 2.5 MG/ML IV SOLN
INTRAVENOUS | Status: DC | PRN
  Administered 2021-07-31 (×2): 10 mL via INTRA_ARTERIAL

## 2021-07-31 MED ORDER — NITROGLYCERIN 1 MG/10 ML FOR IR/CATH LAB
INTRA_ARTERIAL | Status: AC
  Filled 2021-07-31: qty 10

## 2021-07-31 MED ORDER — VERAPAMIL HCL 2.5 MG/ML IV SOLN
INTRAVENOUS | Status: AC
  Filled 2021-07-31: qty 2

## 2021-07-31 MED ORDER — ROSUVASTATIN CALCIUM 20 MG PO TABS: 20.0000 mg | ORAL_TABLET | Freq: Every day | ORAL | Status: DC

## 2021-07-31 MED ORDER — HYDRALAZINE HCL 20 MG/ML IJ SOLN: 10.0000 mg | INTRAMUSCULAR | Status: AC | PRN

## 2021-07-31 MED ORDER — ACETAMINOPHEN 325 MG PO TABS: 650.0000 mg | ORAL_TABLET | ORAL | Status: DC | PRN

## 2021-07-31 SURGICAL SUPPLY — 19 items
BALL SAPPHIRE NC24 2.75X15 (BALLOONS) ×2
BALLN SAPPHIRE 2.0X15 (BALLOONS) ×2
BALLOON SAPPHIRE 2.0X15 (BALLOONS) IMPLANT
BALLOON SAPPHIRE NC24 2.75X15 (BALLOONS) IMPLANT
CATH INFINITI JR4 5F (CATHETERS) ×1 IMPLANT
CATH LAUNCHER 6FR JR4 (CATHETERS) ×1 IMPLANT
CATH OPTITORQUE TIG 4.0 5F (CATHETERS) ×1 IMPLANT
DEVICE RAD COMP TR BAND LRG (VASCULAR PRODUCTS) ×1 IMPLANT
GLIDESHEATH SLEND SS 6F .021 (SHEATH) ×1 IMPLANT
GUIDEWIRE INQWIRE 1.5J.035X260 (WIRE) IMPLANT
INQWIRE 1.5J .035X260CM (WIRE) ×2
KIT ENCORE 26 ADVANTAGE (KITS) ×1 IMPLANT
KIT HEART LEFT (KITS) ×2 IMPLANT
PACK CARDIAC CATHETERIZATION (CUSTOM PROCEDURE TRAY) ×2 IMPLANT
SHEATH PROBE COVER 6X72 (BAG) ×1 IMPLANT
STENT ONYX FRONTIER 2.5X18 (Permanent Stent) ×1 IMPLANT
TRANSDUCER W/STOPCOCK (MISCELLANEOUS) ×2 IMPLANT
TUBING CIL FLEX 10 FLL-RA (TUBING) ×2 IMPLANT
WIRE COUGAR XT STRL 190CM (WIRE) ×1 IMPLANT

## 2021-07-31 NOTE — H&P (View-Only) (Signed)
? ?Progress Note ? ?Patient Name: Debbie [name] ?Date of Encounter: 07/31/2021 ? ?[facility] Cardiologist: Shelva Majestic, MD  ? ?Subjective  ? ?Denies any CP since prior to coming to the hospital ? ?Inpatient Medications  ?  ?Scheduled Meds: ? ezetimibe  10 mg Oral Daily  ? gabapentin  300 mg Oral BID  ? insulin aspart  0-15 Units Subcutaneous TID WC  ? irbesartan  150 mg Oral Daily  ? metoprolol succinate  50 mg Oral Once  ? mirabegron ER  50 mg Oral Daily  ? multivitamin with minerals  1 tablet Oral Daily  ? rosuvastatin  5 mg Oral Daily  ? sodium chloride flush  3 mL Intravenous Q12H  ? ?Continuous Infusions: ? sodium chloride    ? sodium chloride 1 mL/kg/hr (07/31/21 4166)  ? heparin 650 Units/hr (07/31/21 0630)  ? ?PRN Meds: ?sodium chloride, acetaminophen, HYDROcodone-acetaminophen, methocarbamol, ondansetron (ZOFRAN) IV, sodium chloride flush  ? ?Vital Signs  ?  ?Vitals:  ? 07/30/21 2037 07/30/21 2355 07/31/21 0451 07/31/21 1601  ?BP: (!) 182/69 (!) 163/69 (!) 190/84 (!) 181/76  ?Pulse: 65 (!) 58 61   ?Resp:    18  ?Temp: 98.5 ?F (36.9 ?C) 98.2 ?F (36.8 ?C) 98.2 ?F (36.8 ?C) 98 ?F (36.7 ?C)  ?TempSrc: Oral Oral Oral Oral  ?SpO2: 95% 96% 96%   ?Weight: 85.9 kg     ?Height: '5\' 5"'$  (1.651 m)     ? ? ?Intake/Output Summary (Last 24 hours) at 07/31/2021 1109 ?Last data filed at 07/31/2021 0900 ?Gross per 24 hour  ?Intake 426.23 ml  ?Output --  ?Net 426.23 ml  ? ? ?  07/30/2021  ?  8:37 PM 07/29/2021  ? 11:45 PM 07/10/2021  ?  4:08 AM  ?Last 3 Weights  ?Weight (lbs) 189 lb 6 oz 194 lb 0.1 oz 194 lb 3.6 oz  ?Weight (kg) 85.9 kg 88 kg 88.1 kg  ?   ? ?Telemetry  ?  ?NSR with HR 70s - Personally Reviewed ? ?ECG  ?  ?NSR without significant ST-T wave changes - Personally Reviewed ? ?Physical Exam  ? ?GEN: No acute distress.   ?Neck: No JVD ?Cardiac: RRR, no murmurs, rubs, or gallops.  ?Respiratory: Clear to auscultation bilaterally. ?GI: Soft, nontender, non-distended  ?MS: No edema; No deformity. ?Neuro:  Nonfocal   ?Psych: Normal affect  ? ?Labs  ?  ?High Sensitivity Troponin:   ?Recent Labs  ?Lab 07/09/21 ?1020 07/25/21 ?1834 07/29/21 ?0449 07/29/21 ?0920 07/29/21 ?1344  ?TROPONINIHS 73* 33* 37* 35* 33*  ?   ?Chemistry ?Recent Labs  ?Lab 07/25/21 ?0932 07/29/21 ?3557 07/29/21 ?0920 07/30/21 ?3220 07/30/21 ?2542 07/31/21 ?7062  ?NA 141   < >  --  134* 139 139  ?K 3.2*   < >  --  3.0* 3.2* 4.0  ?CL 113*   < >  --  107 106 106  ?CO2 20*   < >  --  19* 23 22  ?GLUCOSE 117*   < >  --  113* 132* 135*  ?BUN 12   < >  --  '13 14 10  '$ ?CREATININE 0.89   < >  --  0.94 1.04* 0.97  ?CALCIUM 9.3   < >  --  8.8* 9.8 9.4  ?MG 1.5*  --  1.6*  --  2.4  --   ?PROT 6.4*  --  6.7  --   --   --   ?ALBUMIN 3.5  --  3.8  --   --   --   ?  AST 18  --  21  --   --   --   ?ALT 21  --  22  --   --   --   ?ALKPHOS 127*  --  126  --   --   --   ?BILITOT 0.9  --  1.1  --   --   --   ?GFRNONAA >60   < >  --  >60 55* >60  ?ANIONGAP 8   < >  --  '8 10 11  '$ ? < > = values in this interval not displayed.  ?  ?Lipids  ?Recent Labs  ?Lab 07/30/21 ?0508  ?CHOL 110  ?TRIG 96  ?HDL 44  ?[place] 47  ?CHOLHDL 2.5  ?  ?Hematology ?Recent Labs  ?Lab 07/29/21 ?1610 07/30/21 ?0508 07/31/21 ?0456  ?WBC 8.3 7.0 6.6  ?RBC 3.87 3.52*  3.45* 3.62*  ?HGB 11.7* 10.7* 10.9*  ?HCT 36.6 35.2* 33.8*  ?MCV 94.6 100.0 93.4  ?MCH 30.2 30.4 30.1  ?MCHC 32.0 30.4 32.2  ?RDW 15.0 15.2 14.9  ?PLT 344 274 287  ? ?Thyroid  ?Recent Labs  ?Lab 07/25/21 ?1834  ?TSH 1.401  ?FREET4 1.09  ?  ?BNP ?Recent Labs  ?Lab 07/25/21 ?1834  ?BNP 242.1*  ?  ?DDimer No results for input(s): DDIMER in the last 168 hours.  ? ?Radiology  ?  ?No results found. ? ?Cardiac Studies  ? ?Echo 07/09/2021 ? 1. Left ventricular ejection fraction, by estimation, is 55 to 60%. The  ?left ventricle has normal function. The left ventricle has no regional  ?wall motion abnormalities. Left ventricular diastolic function could not  ?be evaluated.  ? 2. Right ventricular systolic function is normal. The right ventricular  ?size is  normal.  ? 3. The mitral valve is normal in structure. Trivial mitral valve  ?regurgitation. No evidence of mitral stenosis.  ? 4. The aortic valve is tricuspid. Aortic valve regurgitation is not  ?visualized. No aortic stenosis is present.  ? 5. The inferior vena cava is normal in size with greater than 50%  ?respiratory variability, suggesting right atrial pressure of 3 mmHg.  ? ?Comparison(s): No prior Echocardiogram.  ? ?Patient Profile  ?   ?76 y.o. female with PMH of remote stroke (1998), CAD (3 stents to RCA 2001, last cath 2006 showed patent stent), DM II, HTN, HLD, PAF who presented with chest discomfort that is reminiscent of the previous angina.  ? ?Assessment & Plan  ?  ?Chest pain concerning for unstable angina ? - serial trop 30s x 2. ? - given the similarity of chest pain with previous angina, plan for cardiac cath today ? - Risk and benefit of procedure explained to the patient who display clear understanding and agree to proceed. ? ?Discussed with patient possible procedural risk include bleeding, vascular injury, renal injury, arrythmia, MI, stroke and loss of limb or life. ? ?CAD s/p PCI ? - 3 stents to RCA 2001, last cath 2006 showed patent stent ? ?HTN: BP elevated. Toprol decreased to '50mg'$  daily. Add amlodipine '5mg'$  daily.  ? ?HLD ? ?DM II ? ?PAF: recently diagnosed. Eliquis on hold.  ? ?   ? ?For questions or updates, please contact [place] ?Please consult [email or web address] for contact info under  ? ?  ?   ?Signed, ?Almyra Deforest, [place]  ?07/31/2021, 11:09 AM   ? ? ?I have seen and examined the patient along with Almyra Deforest, PA .  I have reviewed the chart, notes and  new data.  I agree with PA/NP's note. ? ?Key new complaints: No angina no dyspnea since arriving at the hospital ?Key examination changes: Elevated blood pressure.  Her beta-blocker dose was cut in half due to periods of bradycardia.  Agree with adding calcium channel blocker. ?Key new findings / data: Remains in sinus rhythm, no serious  bradycardia on telemetry. Normal left ventricular systolic function on recent echocardiogram ? ?PLAN: ?For coronary angiography later today for angina triggered by atrial fibrillation rapid ventricular response in the setting of known CAD with previous PCI. ?This procedure has been fully reviewed with the patient and written informed consent has been obtained. ?If she needs stent placement, prefer to use aspirin plus clopidogrel since she will also need a direct oral anticoagulant. ? ?Sanda Klein, MD, [facility] ?[facility] ?[phone] ?07/31/2021, 12:01 PM ? ?

## 2021-07-31 NOTE — Progress Notes (Signed)
Physical Therapy Treatment ?Patient Details ?Name: Debbie [name] ?MRN: [id number] ?DOB: 1945-08-23 ?Today's Date: 07/31/2021 ? ? ?History of Present Illness Pt is a 76 y/o female admitted secondary to chest pain. Troponins negative; workup pending. Plan for heart cath 07/31/21.  Pt with recent Lumbar fusion in 06/2021. PMH includes a fib, CVA, DM, CAD, and CHF. ? ?  ?PT Comments  ? ? Patient not progressing with mobility today secondary to pain in bil knees with weight bearing. Able to get to EOB with Min A to manage RLE. Able to weight shift anteriorly in an attempt to stand but quickly returning to supine position due to pain. Repeated this pattern of movements x5. Able to partially stand from EOB but limited due to pain and returned to supine, assist needed for repositioning. HR in 50s bpm. Reports she has this pain at home but able to "push through." ? Plan for heart cath today. Will try to premedicate prior to next session to increase chances of activity tolerance and mobility. Will follow. ?  ?Recommendations for follow up therapy are one component of a multi-disciplinary discharge planning process, led by the attending physician.  Recommendations may be updated based on patient status, additional functional criteria and insurance authorization. ? ?Follow Up Recommendations ? Home health PT ?  ?  ?Assistance Recommended at Discharge Frequent or constant Supervision/Assistance  ?Patient can return home with the following A little help with walking and/or transfers;A little help with bathing/dressing/bathroom;Help with stairs or ramp for entrance;Assist for transportation;Assistance with cooking/housework ?  ?Equipment Recommendations ? None recommended by PT  ?  ?Recommendations for Other Services   ? ? ?  ?Precautions / Restrictions Precautions ?Precautions: Back ?Precaution Booklet Issued: No ?Restrictions ?Weight Bearing Restrictions: No  ?  ? ?Mobility ? Bed Mobility ?Overal bed mobility: Needs Assistance ?Bed  Mobility: Supine to Sit, Sit to Supine ?  ?  ?Supine to sit: Min assist, Min guard, HOB elevated ?Sit to supine: Min guard, HOB elevated ?  ?General bed mobility comments: ASsist with RLE to get to EOB, pt continually laying down and sitting back up x4-5 due to pain in bil knees with WB in an attempt to stand vs flex knees? ?  ? ?Transfers ?Overall transfer level: Needs assistance ?Equipment used: 1 person hand held assist ?Transfers: Sit to/from Stand ?Sit to Stand: Min guard ?  ?  ?  ?  ? Lateral/Scoot Transfers: Supervision ?General transfer comment: Able to partially stand but quickly returning to seated position and back to supine due to pain in bil knees "I just can't do it." Able to laterally scoot along side bed x4 with increased pain in knees, laying back on bed between scooting bouts. ?  ? ?Ambulation/Gait ?  ?  ?  ?  ?  ?  ?  ?General Gait Details: Pt unable to tolerate standing today."I am going to have to make myself next time." ? ? ?Stairs ?  ?  ?  ?  ?  ? ? ?Wheelchair Mobility ?  ? ?Modified Rankin (Stroke Patients Only) ?  ? ? ?  ?Balance Overall balance assessment: Needs assistance ?Sitting-balance support: Feet supported, No upper extremity supported ?Sitting balance-Leahy Scale: Fair ?Sitting balance - Comments: can sit without UE support but using UEs as support due to pain in bil knees ?  ?Standing balance support: During functional activity ?Standing balance-Leahy Scale: Poor ?Standing balance comment: Only standing partially up and then returning to seated position due to pain in knees. ?  ?  ?  ?  ?  ?  ?  ?  ?  ?  ?  ?  ? ?  ?  Cognition Arousal/Alertness: Awake/alert ?Behavior During Therapy: [facility] for tasks assessed/performed ?Overall Cognitive Status: History of cognitive impairments - at baseline ?  ?  ?  ?  ?  ?  ?  ?  ?  ?  ?  ?  ?  ?  ?  ?  ?General Comments: memory impairments at baseline, engages appropriately ?  ?  ? ?  ?Exercises   ? ?  ?General Comments General comments (skin  integrity, edema, etc.): HR in 50s. Family stepped out for session. ?  ?  ? ?Pertinent Vitals/Pain Pain Assessment ?Pain Assessment: Faces ?Faces Pain Scale: Hurts whole lot ?Pain Location: bilateral knees with weight bearing ?Pain Descriptors / Indicators: Aching, Grimacing, Guarding ?Pain Intervention(s): Monitored during session, Repositioned, Limited activity within patient's tolerance  ? ? ?Home Living   ?  ?  ?  ?  ?  ?  ?  ?  ?  ?   ?  ?Prior Function    ?  ?  ?   ? ?PT Goals (current goals can now be found in the care plan section) Progress towards PT goals: Not progressing toward goals - comment (due to knee pain) ? ?  ?Frequency ? ? ? Min 3X/week ? ? ? ?  ?PT Plan Current plan remains appropriate  ? ? ?Co-evaluation   ?  ?  ?  ?  ? ?  ?AM-PAC PT "6 Clicks" Mobility   ?Outcome Measure ? Help needed turning from your back to your side while in a flat bed without using bedrails?: A Little ?Help needed moving from lying on your back to sitting on the side of a flat bed without using bedrails?: A Little ?Help needed moving to and from a bed to a chair (including a wheelchair)?: A Little ?Help needed standing up from a chair using your arms (e.g., wheelchair or bedside chair)?: A Little ?Help needed to walk in hospital room?: A Little ?Help needed climbing 3-5 steps with a railing? : A Lot ?6 Click Score: 17 ? ?  ?End of Session   ?Activity Tolerance: Patient limited by pain ?Patient left: in bed;with call bell/phone within reach;with bed alarm set;with nursing/sitter in room ?Nurse Communication: Mobility status ?PT Visit Diagnosis: Unsteadiness on feet (R26.81);Pain ?Pain - Right/Left:  (bil) ?Pain - part of body: Knee ?  ? ? ?Time: 6759-1638 ?PT Time Calculation (min) (ACUTE ONLY): 19 min ? ?Charges:  $Therapeutic Activity: 8-22 mins          ?          ? ?Marisa Severin, PT, DPT ?Acute Rehabilitation Services ?Secure chat preferred ?Office [phone] ? ? ? ? ? ?[place] ?07/31/2021, 1:22 PM ? ?

## 2021-07-31 NOTE — Progress Notes (Signed)
ANTICOAGULATION CONSULT NOTE - Follow Up Consult ? ?Pharmacy Consult for heparin ?Indication:  CP/PAF ? ?Labs: ?Recent Labs  ?  07/29/21 ?0449 07/29/21 ?0920 07/29/21 ?1344 07/30/21 ?0508 07/30/21 ?0713 07/30/21 ?1515 07/31/21 ?0456  ?HGB 11.7*  --   --  10.7*  --   --  10.9*  ?HCT 36.6  --   --  35.2*  --   --  33.8*  ?PLT 344  --   --  274  --   --  287  ?APTT  --   --   --   --   --  196* 112*  ?HEPARINUNFRC  --   --   --   --   --  >1.10* >1.10*  ?CREATININE 0.97  --   --  0.94 1.04*  --  0.97  ?CKTOTAL  --  56  --   --   --   --   --   ?TROPONINIHS 37* 35* 33*  --   --   --   --   ? ? ?Assessment: ?76yo female supratherapeutic on heparin after rate change though closer to goal; no infusion issues or signs of bleeding per RN. ? ?Goal of Therapy:  ?aPTT 66-102 seconds ?  ?Plan:  ?Will decrease heparin infusion by 2 units/kg/hr to 650 units/hr and check PTT in 8 hours.   ? ?Wynona Neat, PharmD, BCPS  ?07/31/2021,6:19 AM ? ? ?

## 2021-07-31 NOTE — Interval H&P Note (Signed)
Cath Lab Visit (complete for each Cath Lab visit) ? ?Clinical Evaluation Leading to the Procedure:  ? ?ACS: No. ? ?Non-ACS:   ? ?Anginal Classification: CCS III ? ?Anti-ischemic medical therapy: Minimal Therapy (1 class of medications) ? ?Non-Invasive Test Results: No non-invasive testing performed ? ?Prior CABG: No previous CABG ? ? ? ? ? ?History and Physical Interval Note: ? ?07/31/2021 ?3:39 PM ? ?Debbie [name]  has presented today for surgery, with the diagnosis of angina.  The various methods of treatment have been discussed with the patient and family. After consideration of risks, benefits and other options for treatment, the patient has consented to  Procedure(s): ?LEFT HEART CATH AND CORONARY ANGIOGRAPHY (N/A) as a surgical intervention.  The patient's history has been reviewed, patient examined, no change in status, stable for surgery.  I have reviewed the patient's chart and labs.  Questions were answered to the patient's satisfaction.   ? ? ?Shelva Majestic ? ? ?

## 2021-07-31 NOTE — Progress Notes (Signed)
?PROGRESS NOTE ? ? ? ?Debbie [name]  GUY:[id number] DOB: 10-14-1945 DOA: 07/29/2021 ?PCP: Sandi Mariscal, MD  ? ? ?Brief Narrative:  ?Debbie [name] is a 76 y.o. female with past medical history of diabetes mellitus type 2, hypertension, history of stroke, CAD and history of lumbar spine surgery including lumbar laminectomy, fusion and fixation on 07/04/2021 by Dr. Ronnald Ramp, who was hospitalized on 07/09/2021 with chest pain and shortness of breath.  At that time, she was thought to be having volume overload and discharged on 3/28.  Patient subsequently presented on 07/25/2021 with shortness of breath and chest pain and was noted to have atrial fibrillation which was converted to sinus while in the ED.  Cardiology had seen the patient at that time and metoprolol dose was increased and she was switched to Eliquis.  At this time, patient presented with pressure-like sensation in the central part of the chest which improved with sublingual nitro.  EMS was called in and patient was brought into the hospital.  Cardiology was consulted.  CT angiogram of the chest did not show any pulmonary embolism.   ? ?Assessment and plan ? ?Principal Problem: ?  Chest pain ?Active Problems: ?  Coronary artery disease involving native coronary artery of native heart ?  Type 2 diabetes mellitus without complication, without long-term current use of insulin ([place]) ?  PAF (paroxysmal atrial fibrillation) ([place]) ?  Essential hypertension ?  S/P lumbar fusion ?  History of CVA, 1998  ?  Hypomagnesemia ?  Normocytic anemia ?  ?Chest pain/pressure ?Patient had mildly elevated troponin.  History of recent atrial fibrillation on metoprolol.  Chest pressure improved with nitroglycerin.  CTA chest without any PE.  Chest x-ray showed chronic interstitial findings.  No concern for pneumonia.  Cardiology on board and recommending cardiac catheterization for possible progressive angina.  Awaiting for cardiac catheterization today. ?  ?Coronary artery  disease ?History of CAD and PCI.  On Eliquis since 07/25/2021.  Was on beta-blocker, hold for now due to bradycardia.  Continue statins.. ?  ?Type 2 diabetes mellitus without complication, without long-term current use of insulin ([place]) ?HbA1c of 7.1 in March.  Continue sliding scale insulin Accu-Cheks diabetic diet.  Hold metformin ?  ?PAF (paroxysmal atrial fibrillation) ([place]) ?Recently diagnosed of 07/25/2021.  Was on Eliquis as outpatient including beta-blocker.  Beta-blocker dose was decreased due to bradycardia.  Amlodipine has been added for adequate blood pressure control.  On IV heparin at this time. ?  ?Essential hypertension ?Continue ARB.  Dose of beta-blocker has been decreased.  Amlodipine has been added instead for adequate blood pressure control.. ?  ?S/P lumbar fusion ?Spinal surgery March 2023 by Dr. Ronnald Ramp.  No neurological deficits bilateral anterior leg pain.  On Neurontin we will continue.  Might benefit  3 times daily Neurontin instead of twice daily since she complains of a lot of pain. ? ?Hypokalemia.  Improved after placement.  Latest potassium of 4.2. ? ?Normocytic anemia ?Latest hemoglobin of 10.7.  Vitamin B12 at 285.  Iron studies within normal range. ?  ?Hypomagnesemia ?Improved.  Latest magnesium of 2.4. ?  ?History of CVA, 1998  ?On Eliquis as outpatient. ?  ?  ? DVT prophylaxis:   Heparin drip ? ? ?Code Status:   ?  Code Status: Full Code ? ?Disposition: Home ? ?Status is: Observation ? ?The patient will require care spanning > 2 midnights and should be moved to inpatient because: Need for cardiac catheterization. ? ? Family Communication:   ?  Spoke with the patient's family at bedside on 07/30/2021. ? ?Consultants:  ?Cardiology ? ?Procedures:  ?None yet ? ?Antimicrobials:  ?None ? ?Anti-infectives (From admission, onward)  ? ? None  ? ?  ? ?Subjective: ?Today, patient was seen and examined at bedside.  Denies any chest pain shortness of breath but complains of bilateral anterior leg pain  which has been going on for 2 years or more. . ? ?Objective: ?Vitals:  ? 07/31/21 0807 07/31/21 1135 07/31/21 1210 07/31/21 1225  ?BP: (!) 181/76  (!) 190/68 (!) 190/68  ?Pulse:  60  (!) 57  ?Resp: '18 19  17  '$ ?Temp: 98 ?F (36.7 ?C) 97.9 ?F (36.6 ?C)  97.6 ?F (36.4 ?C)  ?TempSrc: Oral Oral  Oral  ?SpO2:  94%    ?Weight:      ?Height:      ? ? ?Intake/Output Summary (Last 24 hours) at 07/31/2021 1356 ?Last data filed at 07/31/2021 0900 ?Gross per 24 hour  ?Intake 426.23 ml  ?Output --  ?Net 426.23 ml  ? ?Filed Weights  ? 07/29/21 2345 07/30/21 2037  ?Weight: 88 kg 85.9 kg  ? ? ?Physical Examination: ?Body mass index is 31.51 kg/m?.  ? ?General: Obese built, not in obvious distress ?HENT:   No scleral pallor or icterus noted. Oral mucosa is moist.  ?Chest:  Clear breath sounds.  Diminished breath sounds bilaterally. No crackles or wheezes.  ?CVS: S1 &S2 heard. No murmur.  Regular rate and rhythm. ?Abdomen: Soft, nontender, nondistended.  Bowel sounds are heard.   ?Extremities: No cyanosis, clubbing or edema.  Peripheral pulses are palpable.  Mild tenderness on palpation of the anterior shins bilaterally. ?Psych: Alert, awake and oriented, normal mood ?CNS:  No cranial nerve deficits.  Power equal in all extremities.   ?Skin: Warm and dry.  No rashes noted. ? ?Data Reviewed:  ? ?CBC: ?Recent Labs  ?Lab 07/25/21 ?1834 07/29/21 ?8546 07/30/21 ?2703 07/31/21 ?0456  ?WBC 8.6 8.3 7.0 6.6  ?NEUTROABS 5.6  --   --   --   ?HGB 11.2* 11.7* 10.7* 10.9*  ?HCT 35.3* 36.6 35.2* 33.8*  ?MCV 96.4 94.6 100.0 93.4  ?PLT 381 344 274 287  ? ? ?Basic Metabolic Panel: ?Recent Labs  ?Lab 07/25/21 ?5009 07/29/21 ?3818 07/29/21 ?0920 07/30/21 ?2993 07/30/21 ?7169 07/31/21 ?6789  ?NA 141 142  --  134* 139 139  ?K 3.2* 4.0  --  3.0* 3.2* 4.0  ?CL 113* 114*  --  107 106 106  ?CO2 20* 20*  --  19* 23 22  ?GLUCOSE 117* 154*  --  113* 132* 135*  ?BUN 12 13  --  '13 14 10  '$ ?CREATININE 0.89 0.97  --  0.94 1.04* 0.97  ?CALCIUM 9.3 9.6  --  8.8* 9.8 9.4   ?MG 1.5*  --  1.6*  --  2.4  --   ? ? ?Liver Function Tests: ?Recent Labs  ?Lab 07/25/21 ?1834 07/29/21 ?0920  ?AST 18 21  ?ALT 21 22  ?ALKPHOS 127* 126  ?BILITOT 0.9 1.1  ?PROT 6.4* 6.7  ?ALBUMIN 3.5 3.8  ? ? ? ?Radiology Studies: ?No results found. ? ? ? LOS: 0 days  ? ? ?Flora Lipps, MD ?[facility] ?Available via Epic secure chat 7am-7pm ?After these hours, please refer to coverage provider listed on [email or web address] ?07/31/2021, 1:56 PM  ?  ?

## 2021-07-31 NOTE — Plan of Care (Signed)

## 2021-07-31 NOTE — Progress Notes (Addendum)
? ?Progress Note ? ?Patient Name: Debbie [name] ?Date of Encounter: 07/31/2021 ? ?[facility] Cardiologist: Shelva Majestic, MD  ? ?Subjective  ? ?Denies any CP since prior to coming to the hospital ? ?Inpatient Medications  ?  ?Scheduled Meds: ? ezetimibe  10 mg Oral Daily  ? gabapentin  300 mg Oral BID  ? insulin aspart  0-15 Units Subcutaneous TID WC  ? irbesartan  150 mg Oral Daily  ? metoprolol succinate  50 mg Oral Once  ? mirabegron ER  50 mg Oral Daily  ? multivitamin with minerals  1 tablet Oral Daily  ? rosuvastatin  5 mg Oral Daily  ? sodium chloride flush  3 mL Intravenous Q12H  ? ?Continuous Infusions: ? sodium chloride    ? sodium chloride 1 mL/kg/hr (07/31/21 5852)  ? heparin 650 Units/hr (07/31/21 7782)  ? ?PRN Meds: ?sodium chloride, acetaminophen, HYDROcodone-acetaminophen, methocarbamol, ondansetron (ZOFRAN) IV, sodium chloride flush  ? ?Vital Signs  ?  ?Vitals:  ? 07/30/21 2037 07/30/21 2355 07/31/21 0451 07/31/21 4235  ?BP: (!) 182/69 (!) 163/69 (!) 190/84 (!) 181/76  ?Pulse: 65 (!) 58 61   ?Resp:    18  ?Temp: 98.5 ?F (36.9 ?C) 98.2 ?F (36.8 ?C) 98.2 ?F (36.8 ?C) 98 ?F (36.7 ?C)  ?TempSrc: Oral Oral Oral Oral  ?SpO2: 95% 96% 96%   ?Weight: 85.9 kg     ?Height: '5\' 5"'$  (1.651 m)     ? ? ?Intake/Output Summary (Last 24 hours) at 07/31/2021 1109 ?Last data filed at 07/31/2021 0900 ?Gross per 24 hour  ?Intake 426.23 ml  ?Output --  ?Net 426.23 ml  ? ? ?  07/30/2021  ?  8:37 PM 07/29/2021  ? 11:45 PM 07/10/2021  ?  4:08 AM  ?Last 3 Weights  ?Weight (lbs) 189 lb 6 oz 194 lb 0.1 oz 194 lb 3.6 oz  ?Weight (kg) 85.9 kg 88 kg 88.1 kg  ?   ? ?Telemetry  ?  ?NSR with HR 70s - Personally Reviewed ? ?ECG  ?  ?NSR without significant ST-T wave changes - Personally Reviewed ? ?Physical Exam  ? ?GEN: No acute distress.   ?Neck: No JVD ?Cardiac: RRR, no murmurs, rubs, or gallops.  ?Respiratory: Clear to auscultation bilaterally. ?GI: Soft, nontender, non-distended  ?MS: No edema; No deformity. ?Neuro:  Nonfocal   ?Psych: Normal affect  ? ?Labs  ?  ?High Sensitivity Troponin:   ?Recent Labs  ?Lab 07/09/21 ?1020 07/25/21 ?1834 07/29/21 ?0449 07/29/21 ?0920 07/29/21 ?1344  ?TROPONINIHS 73* 33* 37* 35* 33*  ?   ?Chemistry ?Recent Labs  ?Lab 07/25/21 ?3614 07/29/21 ?4315 07/29/21 ?0920 07/30/21 ?4008 07/30/21 ?6761 07/31/21 ?9509  ?NA 141   < >  --  134* 139 139  ?K 3.2*   < >  --  3.0* 3.2* 4.0  ?CL 113*   < >  --  107 106 106  ?CO2 20*   < >  --  19* 23 22  ?GLUCOSE 117*   < >  --  113* 132* 135*  ?BUN 12   < >  --  '13 14 10  '$ ?CREATININE 0.89   < >  --  0.94 1.04* 0.97  ?CALCIUM 9.3   < >  --  8.8* 9.8 9.4  ?MG 1.5*  --  1.6*  --  2.4  --   ?PROT 6.4*  --  6.7  --   --   --   ?ALBUMIN 3.5  --  3.8  --   --   --   ?  AST 18  --  21  --   --   --   ?ALT 21  --  22  --   --   --   ?ALKPHOS 127*  --  126  --   --   --   ?BILITOT 0.9  --  1.1  --   --   --   ?GFRNONAA >60   < >  --  >60 55* >60  ?ANIONGAP 8   < >  --  '8 10 11  '$ ? < > = values in this interval not displayed.  ?  ?Lipids  ?Recent Labs  ?Lab 07/30/21 ?0508  ?CHOL 110  ?TRIG 96  ?HDL 44  ?[place] 47  ?CHOLHDL 2.5  ?  ?Hematology ?Recent Labs  ?Lab 07/29/21 ?8469 07/30/21 ?0508 07/31/21 ?0456  ?WBC 8.3 7.0 6.6  ?RBC 3.87 3.52*  3.45* 3.62*  ?HGB 11.7* 10.7* 10.9*  ?HCT 36.6 35.2* 33.8*  ?MCV 94.6 100.0 93.4  ?MCH 30.2 30.4 30.1  ?MCHC 32.0 30.4 32.2  ?RDW 15.0 15.2 14.9  ?PLT 344 274 287  ? ?Thyroid  ?Recent Labs  ?Lab 07/25/21 ?1834  ?TSH 1.401  ?FREET4 1.09  ?  ?BNP ?Recent Labs  ?Lab 07/25/21 ?1834  ?BNP 242.1*  ?  ?DDimer No results for input(s): DDIMER in the last 168 hours.  ? ?Radiology  ?  ?No results found. ? ?Cardiac Studies  ? ?Echo 07/09/2021 ? 1. Left ventricular ejection fraction, by estimation, is 55 to 60%. The  ?left ventricle has normal function. The left ventricle has no regional  ?wall motion abnormalities. Left ventricular diastolic function could not  ?be evaluated.  ? 2. Right ventricular systolic function is normal. The right ventricular  ?size is  normal.  ? 3. The mitral valve is normal in structure. Trivial mitral valve  ?regurgitation. No evidence of mitral stenosis.  ? 4. The aortic valve is tricuspid. Aortic valve regurgitation is not  ?visualized. No aortic stenosis is present.  ? 5. The inferior vena cava is normal in size with greater than 50%  ?respiratory variability, suggesting right atrial pressure of 3 mmHg.  ? ?Comparison(s): No prior Echocardiogram.  ? ?Patient Profile  ?   ?76 y.o. female with PMH of remote stroke (1998), CAD (3 stents to RCA 2001, last cath 2006 showed patent stent), DM II, HTN, HLD, PAF who presented with chest discomfort that is reminiscent of the previous angina.  ? ?Assessment & Plan  ?  ?Chest pain concerning for unstable angina ? - serial trop 30s x 2. ? - given the similarity of chest pain with previous angina, plan for cardiac cath today ? - Risk and benefit of procedure explained to the patient who display clear understanding and agree to proceed. ? ?Discussed with patient possible procedural risk include bleeding, vascular injury, renal injury, arrythmia, MI, stroke and loss of limb or life. ? ?CAD s/p PCI ? - 3 stents to RCA 2001, last cath 2006 showed patent stent ? ?HTN: BP elevated. Toprol decreased to '50mg'$  daily. Add amlodipine '5mg'$  daily.  ? ?HLD ? ?DM II ? ?PAF: recently diagnosed. Eliquis on hold.  ? ?   ? ?For questions or updates, please contact [place] ?Please consult [email or web address] for contact info under  ? ?  ?   ?Signed, ?Almyra Deforest, [place]  ?07/31/2021, 11:09 AM   ? ? ?I have seen and examined the patient along with Almyra Deforest, PA .  I have reviewed the chart, notes and  new data.  I agree with PA/NP's note. ? ?Key new complaints: No angina no dyspnea since arriving at the hospital ?Key examination changes: Elevated blood pressure.  Her beta-blocker dose was cut in half due to periods of bradycardia.  Agree with adding calcium channel blocker. ?Key new findings / data: Remains in sinus rhythm, no serious  bradycardia on telemetry. Normal left ventricular systolic function on recent echocardiogram ? ?PLAN: ?For coronary angiography later today for angina triggered by atrial fibrillation rapid ventricular response in the setting of known CAD with previous PCI. ?This procedure has been fully reviewed with the patient and written informed consent has been obtained. ?If she needs stent placement, prefer to use aspirin plus clopidogrel since she will also need a direct oral anticoagulant. ? ?Sanda Klein, MD, [facility] ?[facility] ?[phone] ?07/31/2021, 12:01 PM ? ?

## 2021-08-01 ENCOUNTER — Encounter (HOSPITAL_COMMUNITY): Payer: Self-pay | Admitting: Cardiovascular Disease

## 2021-08-01 ENCOUNTER — Ambulatory Visit (HOSPITAL_COMMUNITY): Payer: Medicare PPO | Admitting: Physician Assistant

## 2021-08-01 DIAGNOSIS — I200 Unstable angina: Secondary | ICD-10-CM | POA: Diagnosis not present

## 2021-08-01 DIAGNOSIS — E876 Hypokalemia: Secondary | ICD-10-CM

## 2021-08-01 DIAGNOSIS — I119 Hypertensive heart disease without heart failure: Secondary | ICD-10-CM

## 2021-08-01 DIAGNOSIS — I25110 Atherosclerotic heart disease of native coronary artery with unstable angina pectoris: Secondary | ICD-10-CM | POA: Diagnosis not present

## 2021-08-01 DIAGNOSIS — I10 Essential (primary) hypertension: Secondary | ICD-10-CM | POA: Diagnosis not present

## 2021-08-01 DIAGNOSIS — I480 Paroxysmal atrial fibrillation: Secondary | ICD-10-CM | POA: Diagnosis not present

## 2021-08-01 DIAGNOSIS — Z95820 Peripheral vascular angioplasty status with implants and grafts: Secondary | ICD-10-CM

## 2021-08-01 LAB — BASIC METABOLIC PANEL
Anion gap: 8 (ref 5–15)
BUN: 5 mg/dL — ABNORMAL LOW (ref 8–23)
CO2: 20 mmol/L — ABNORMAL LOW (ref 22–32)
Calcium: 9.2 mg/dL (ref 8.9–10.3)
Chloride: 111 mmol/L (ref 98–111)
Creatinine, Ser: 0.89 mg/dL (ref 0.44–1.00)
GFR, Estimated: >60 mL/min (ref >60)
Glucose, Bld: 95 mg/dL (ref 70–99)
Potassium: 3.8 mmol/L (ref 3.5–5.1)
Sodium: 139 mmol/L (ref 135–145)

## 2021-08-01 LAB — CBC
HCT: 32.9 % — ABNORMAL LOW (ref 36.0–46.0)
Hemoglobin: 10.7 g/dL — ABNORMAL LOW (ref 12.0–15.0)
MCH: 30.3 pg (ref 26.0–34.0)
MCHC: 32.5 g/dL (ref 30.0–36.0)
MCV: 93.2 fL (ref 80.0–100.0)
Platelets: 251 10*3/uL (ref 150–400)
RBC: 3.53 MIL/uL — ABNORMAL LOW (ref 3.87–5.11)
RDW: 14.9 % (ref 11.5–15.5)
WBC: 6.2 10*3/uL (ref 4.0–10.5)
nRBC: 0 % (ref 0.0–0.2)

## 2021-08-01 LAB — GLUCOSE, CAPILLARY
Glucose-Capillary: 112 mg/dL — ABNORMAL HIGH (ref 70–99)
Glucose-Capillary: 121 mg/dL — ABNORMAL HIGH (ref 70–99)

## 2021-08-01 LAB — POCT ACTIVATED CLOTTING TIME: Activated Clotting Time: 281 seconds

## 2021-08-01 MED ORDER — AMLODIPINE BESYLATE 10 MG PO TABS: 10.0000 mg | ORAL_TABLET | Freq: Every day | ORAL | Status: DC

## 2021-08-01 MED ORDER — ASPIRIN 81 MG PO CHEW: 81.0000 mg | CHEWABLE_TABLET | Freq: Every day | ORAL | 0 refills | Status: AC

## 2021-08-01 MED ORDER — AMLODIPINE BESYLATE 10 MG PO TABS: 10.0000 mg | ORAL_TABLET | Freq: Every day | ORAL | 2 refills | Status: DC

## 2021-08-01 MED ORDER — AMLODIPINE BESYLATE 5 MG PO TABS
5.0000 mg | ORAL_TABLET | Freq: Once | ORAL | Status: AC
  Administered 2021-08-01: 5 mg via ORAL
  Filled 2021-08-01: qty 1

## 2021-08-01 MED ORDER — METFORMIN HCL 500 MG PO TABS: 500.0000 mg | ORAL_TABLET | Freq: Two times a day (BID) | ORAL | Status: DC

## 2021-08-01 MED ORDER — CLOPIDOGREL BISULFATE 75 MG PO TABS
75.0000 mg | ORAL_TABLET | Freq: Every day | ORAL | 11 refills | Status: DC
Start: 2021-08-01 — End: 2022-08-01

## 2021-08-01 MED FILL — Heparin Sod (Porcine)-NaCl IV Soln 1000 Unit/500ML-0.9%: INTRAVENOUS | Qty: 1000 | Status: AC

## 2021-08-01 NOTE — Care Management (Signed)
?  Transition of Care (TOC) Screening Note ? ? ?Patient Details  ?Name: Debbie [name] ?Date of Birth: 1946/03/19 ? ? ?Transition of Care (TOC) CM/SW Contact:    ?Graves-Bigelow, Ocie Cornfield, RN ?Phone Number: ?08/01/2021, 12:16 PM ? ? ? ?Transition of Care Department [facility]) has reviewed the patient. The patient is currently active with [facility] for PT/OT/RN- Case Manager added aide for bath. MD to place orders for resumption of services. Case Manager provided the spouse with personal care service information. No further needs identified at this time. ?

## 2021-08-01 NOTE — Plan of Care (Signed)

## 2021-08-01 NOTE — Progress Notes (Signed)
Occupational Therapy Treatment ?Patient Details ?Name: Debbie [name] ?MRN: [id number] ?DOB: December 06, 1945 ?Today's Date: 08/01/2021 ? ? ?History of present illness Pt is a 76 y/o female admitted secondary to chest pain. Troponins negative; workup pending. Plan for heart cath 07/31/21.  Pt with recent Lumbar fusion in 06/2021. PMH includes a fib, CVA, DM, CAD, and CHF. ?  ?OT comments ? Pt participated in OT ADL retraining session today with focus on bed mobility, transfers from bed to 3:1 and standing/sitting at sink for grooming as well as pt/family education in activity tolerance, recommendations and role of HHOT when d/c. Pt was overall Min A for functional mobility using RW, Min guard bed mobility and Min guard to/from 3:1. Decreased activity tolerance is a limiting factor. Pt plans to d/c home later today w/ PRN assist from her husband.  ? ?Recommendations for follow up therapy are one component of a multi-disciplinary discharge planning process, led by the attending physician.  Recommendations may be updated based on patient status, additional functional criteria and insurance authorization. ?   ?Follow Up Recommendations ? Home health OT  ?  ?Assistance Recommended at Discharge Intermittent Supervision/Assistance  ?Patient can return home with the following ? A little help with walking and/or transfers;A little help with bathing/dressing/bathroom;Assistance with cooking/housework;Direct supervision/assist for medications management;Direct supervision/assist for financial management;Assist for transportation;Help with stairs or ramp for entrance ?  ?Equipment Recommendations ? None recommended by OT  ?  ?Recommendations for Other Services   ? ?  ?Precautions / Restrictions Precautions ?Precautions: Back ?Precaution Booklet Issued: No ?Restrictions ?Weight Bearing Restrictions: No  ? ? ?  ? ?Mobility Bed Mobility ?Overal bed mobility: Needs Assistance ?Bed Mobility: Supine to Sit, Sit to Supine ?  ?  ?Supine to sit:  Supervision, Min guard, HOB elevated ?Sit to supine: Min guard, HOB elevated ?  ?General bed mobility comments: Assist with R LE when getting back to bed and laying down ?  ? ?Transfers ?Overall transfer level: Needs assistance ?Equipment used: Rolling walker (2 wheels) ?Transfers: Sit to/from Stand ?Sit to Stand: Min guard, From elevated surface ?  ?  ?Step pivot transfers: Min guard, From elevated surface ?  ?  ?  ?  ?  ?Balance Overall balance assessment: Needs assistance ?Sitting-balance support: Feet supported, No upper extremity supported ?Sitting balance-Leahy Scale: Good ?  ?  ?Standing balance support: During functional activity ?Standing balance-Leahy Scale: Poor ?Standing balance comment: Stands briefly at sink for grooming then reliant on RW and counter and returning to seated position on 3:1 at sink. Required encouragement to participte ?  ?   ? ?ADL either performed or assessed with clinical judgement  ? ?ADL Overall ADL's : Needs assistance/impaired ?  ?  ?Grooming: Wash/dry hands;Wash/dry face;Oral care;Standing;Sitting;Cueing for safety;Brushing hair;Set up;Minimal assistance ?Grooming Details (indicate cue type and reason): Min assist using RW from EOB to standing at sink for grooming tasks. Pt quickly fatigued and stated "I can't do this right now, I'm done" after washing her face. Pt then sat on 3:1 for the rest of grooming and ambulated back to her bed with Min A. ?  ?  ?Upper Body Dressing : Set up;Sitting ?  ?  ?Toilet Transfer: Minimal assistance;Ambulation;Rolling walker (2 wheels);BSC/3in1 ?Toilet Transfer Details (indicate cue type and reason): Simulated using 3:1 at sink ?  ?  ?General ADL Comments: Patient presenting with decreased activity tolerance during OT ADL retraining session today. Pt was standing at sink for grooming and after washing her face, stated that she  needed to sit "I'm done, I can't do any more". Pt was then assisted to sitting on 3:1 for the rest of grooming and UB  bathing at sink level. Discussed home set-up, breaking up tasks as activity tolerance increases and advised pt/husband to have chair at sink if pt needs to sit to conserve energy. Also discussed role of OT and HHOT to focus & assist pt in increasing her independence, activity tolerance for ADL's. Pt/spouse verbalized understanding. ?  ? ?Extremity/Trunk Assessment Upper Extremity Assessment ?Upper Extremity Assessment: Overall WFL for tasks assessed ?  ?Lower Extremity Assessment ?Lower Extremity Assessment: Defer to PT evaluation ?  ?Cervical / Trunk Assessment ?Cervical / Trunk Assessment: Back Surgery ?  ? ?Vision Baseline Vision/History: 1 Wears glasses ?Ability to See in Adequate Light: 0 Adequate ?Patient Visual Report: No change from baseline ?Vision Assessment?: No apparent visual deficits ?  ?Perception Perception ?Perception: Not tested ?  ?Praxis Praxis ?Praxis: Not tested ?  ? ?Cognition Arousal/Alertness: Awake/alert ?Behavior During Therapy: [facility] for tasks assessed/performed ?Overall Cognitive Status: History of cognitive impairments - at baseline ?  ?  ?General Comments: memory impairments at baseline, engages appropriately ?  ?  ?   ?   ?   ?General Comments Pt/husband educated in having chair nearby sink at home as pt is w/ decreased activity tolerance at this time.  ? ? ?Pertinent Vitals/ Pain       Pain Assessment ?Pain Assessment: No/denies pain ?Pain Score: 0-No pain ? ?Home Living  Please refer to initial eval ?  ? ?  ?Prior Functioning/Environment   Please refer to initial eval ?   ? ?Frequency ? Min 2X/week  ? ? ? ? ?  ?Progress Toward Goals ? ?OT Goals(current goals can now be found in the care plan section) ? Progress towards OT goals: Progressing toward goals ? ?Acute Rehab OT Goals ?Patient Stated Goal: Go home, get better ?OT Goal Formulation: With patient ?Time For Goal Achievement: 08/13/21 ?Potential to Achieve Goals: Good  ?Plan Discharge plan remains appropriate   ? ?   ?AM-PAC OT "6  Clicks" Daily Activity     ?Outcome Measure ? ? Help from another person eating meals?: A Little ?Help from another person taking care of personal grooming?: A Little ?Help from another person toileting, which includes using toliet, bedpan, or urinal?: A Little ?Help from another person bathing (including washing, rinsing, drying)?: A Lot ?Help from another person to put on and taking off regular upper body clothing?: A Little ?Help from another person to put on and taking off regular lower body clothing?: A Lot ?6 Click Score: 16 ? ?  ?End of Session Equipment Utilized During Treatment: Rolling walker (2 wheels);Gait belt ? ?OT Visit Diagnosis: Unsteadiness on feet (R26.81);Other abnormalities of gait and mobility (R26.89);Muscle weakness (generalized) (M62.81);Pain ?  ?Activity Tolerance Patient tolerated treatment well;Other (comment) (Limited by decreased activity tolerance) ?  ?Patient Left in bed;with call bell/phone within reach;with bed alarm set;with family/visitor present ?  ?Nurse Communication  N/a ?  ? ?   ? ?Time: 6440-3474 ?OT Time Calculation (min): 26 min ? ?Charges: OT General Charges ?$OT Visit: 1 Visit ?OT Treatments ?$Self Care/Home Management : 23-37 mins ? ? ?Percell Miller Beth Dixon, OTR/L ?08/01/2021, 11:21 AM ? ? ?

## 2021-08-01 NOTE — Progress Notes (Signed)
CARDIAC REHAB PHASE I  ? ?PRE:  Rate/Rhythm: 78 SR ? ?  BP: sitting 182/64 ? ?  SaO2:  ? ?MODE:  Ambulation: 11f to recliner  ? ?POST:  Rate/Rhythm: 78 SR ? ?  BP: sitting 192/72  ? ?  SaO2:  ? ?Pt in bed. Motivated to try to walk (hasn't been able to since admit due to knee pain). Moved herself to EOB independently however on EOB c/o severe knee pain, laying back sideways in bed to avoid knee pain. Eventually able to stand from raised bed and take steps to recliner with RW and gait belt, contact guard but no real assist. Sat in recliner but very difficult to get comfortable in recliner. Eventually found a comfortable spot with legs elevated and reclining. Husband present.  ? ?Discussed with pt and husband stent, restrictions, importance of Plavix, diet, smoking cessation, and CRPII. Pt somewhat distracted by pain during discussion. Unable to give exercise guidelines currently. She has [employer] with her. Will refer to [facility] She quit smoking in March. Gave resources and congratulated her. Also gave pt Off the Beat book for her afib. ?[id number] ? ?KYves DillCES, ACSM ?08/01/2021 ?9:10 AM ? ? ? ? ?

## 2021-08-01 NOTE — Discharge Summary (Addendum)
?Physician Discharge Summary ?  ?Patient: Debbie [name] MRN: [id number] DOB: [birth year]  ?Admit date:     07/29/2021  ?Discharge date: 08/01/21  ?Discharge Physician: Corrie Mckusick Jibran Crookshanks  ? ?PCP: Sandi Mariscal, MD  ? ?Recommendations at discharge:  ? ?Follow-up with your primary care physician as outpatient in 1 to 2 weeks. ?Follow-up with cardiology as scheduled by the clinic. ? ?Discharge Diagnoses: ?Active Problems: ?  Coronary artery disease involving native coronary artery of native heart ?  Type 2 diabetes mellitus without complication, without long-term current use of insulin ([place]) ?  PAF (paroxysmal atrial fibrillation) ([place]) ?  Essential hypertension ?  S/P lumbar fusion ?  History of CVA, 1998  ?  Hypomagnesemia ?  Normocytic anemia ? ?Principal Problem (Resolved): ?  Unstable angina (HCC) ? ?Hospital Course: ?Debbie [name] is a 76 y.o. female with past medical history of diabetes mellitus type 2, hypertension, history of stroke, CAD and history of lumbar spine surgery including lumbar laminectomy, fusion and fixation on 07/04/2021 by Dr. Ronnald Ramp, who was hospitalized on 07/09/2021 with chest pain and shortness of breath.  At that time, she was thought to be having volume overload and discharged on 3/28.  Patient subsequently presented on 07/25/2021 with shortness of breath and chest pain and was noted to have atrial fibrillation which was converted to sinus while in the ED.  Cardiology had seen the patient at that time and metoprolol dose was increased and she was switched to Eliquis.  At this time, patient presented with pressure-like sensation in the central part of the chest which improved with sublingual nitro.  EMS was called in and patient was brought into the hospital.  Cardiology was consulted.  CT angiogram of the chest did not show any pulmonary embolism.  Patient was then admitted hospital for further evaluation and treatment. ? ?Assessment and plan ? ?Principal Problem: ?  Chest pain ?Active Problems: ?   Coronary artery disease involving native coronary artery of native heart ?  Type 2 diabetes mellitus without complication, without long-term current use of insulin ([place]) ?  PAF (paroxysmal atrial fibrillation) ([place]) ?  Essential hypertension ?  S/P lumbar fusion ?  History of CVA, 1998  ?  Hypomagnesemia ?  Normocytic anemia ?  ?Chest pain/pressure likely unstable angina. ?Patient had mildly elevated troponin.  History of recent atrial fibrillation on metoprolol.  Chest pressure improved with nitroglycerin.  CTA chest without any PE.  Chest x-ray showed chronic interstitial findings.  No concern for pneumonia.  Cardiology was consulted and recommended cardiac catheterization.  Patient underwent cardiac catheterization on 07/31/2021 with findings of 80% proximal RCA lesion and a stent was placed in.  Cardiology recommends triple therapy for 1 month followed by Plavix and Eliquis.   ? ?Coronary artery disease ?History of CAD and PCI.  On Eliquis since 07/25/2021.  Status post PCI 07/31/2021.  On low-dose Toprol, continue statins.  Continue dual antiplatelets. ? ?Type 2 diabetes mellitus without complication, without long-term current use of insulin ([place]) ?HbA1c of 7.1 in March.  Continue diabetic diet and resume metformin after discharge. ?  ?PAF (paroxysmal atrial fibrillation) ([place]) ?Recently diagnosed of 07/25/2021.  Was on Eliquis as outpatient including beta-blocker.  Beta-blocker dose was decreased.  Will be resumed on Eliquis on discharge ? ?Essential hypertension ?Continue ARB.  Dose of beta-blocker has been decreased.  Amlodipine has been added instead for adequate blood pressure control. ?  ?Hypokalemia.  Improved after replacement.  Latest potassium of 0.8. ? ?Normocytic anemia ?Latest  hemoglobin of 10.7.  Vitamin B12 at 285.  Iron studies within normal range. ?  ?Hypomagnesemia ?Improved after replacement. ?  ?History of CVA, 1998  ?On Eliquis as outpatient.  We will continue on discharge. ? ?S/P lumbar fusion  with leg pain. ?Spinal surgery March 2023 by Dr. Ronnald Ramp.  No neurological deficits continues to complain of bilateral anterior leg pain.  Advised to continue taking Neurontin.  Might need dose adjustment.  Spoke with the patient husband reported.  Seen by physical therapy and Occupational Therapy recommend home health PT on discharge. ?  ?Consultants: Cardiology ?Procedures performed: Cardiac catheterization with PCI on 07/31/2021. ?Disposition: Home with home health ?Diet recommendation:  ?Discharge Diet Orders (From admission, onward)  ? ?  Start     Ordered  ? 08/01/21 0000  Diet - low sodium heart healthy       ? 08/01/21 1020  ? ?  ?  ? ?  ? ?Cardiac diet ?DISCHARGE MEDICATION: ?Allergies as of 08/01/2021   ?No Known Allergies ?  ? ?  ?Medication List  ?  ? ?STOP taking these medications   ? ?potassium chloride 10 MEQ tablet ?Commonly known as: KLOR-CON M ?  ? ?  ? ?TAKE these medications   ? ?acetaminophen 500 MG tablet ?Commonly known as: TYLENOL ?Take 2 tablets (1,000 mg total) by mouth every 8 (eight) hours as needed. ?  ?amLODipine 10 MG tablet ?Commonly known as: NORVASC ?Take 1 tablet (10 mg total) by mouth daily. ?Start taking on: August 02, 2021 ?  ?apixaban 5 MG Tabs tablet ?Commonly known as: ELIQUIS ?Take 1 tablet (5 mg total) by mouth 2 (two) times daily. ?  ?aspirin 81 MG chewable tablet ?Chew 1 tablet (81 mg total) by mouth daily. ?  ?Centrum Silver Adult 50+ Tabs ?Take 1 tablet by mouth daily. ?  ?clopidogrel 75 MG tablet ?Commonly known as: PLAVIX ?Take 1 tablet (75 mg total) by mouth daily with breakfast. ?  ?ezetimibe 10 MG tablet ?Commonly known as: ZETIA ?Take 1 tablet by mouth once daily ?  ?furosemide 20 MG tablet ?Commonly known as: LASIX ?Take 1 tablet by mouth once daily ?  ?gabapentin 300 MG capsule ?Commonly known as: NEURONTIN ?Take 300 mg by mouth in the morning, at noon, and at bedtime. ?  ?HYDROcodone-acetaminophen 5-325 MG tablet ?Commonly known as: NORCO/VICODIN ?Take 1 tablet by  mouth every 4 (four) hours as needed for moderate pain. ?  ?metFORMIN 500 MG tablet ?Commonly known as: GLUCOPHAGE ?Take 1 tablet (500 mg total) by mouth 2 (two) times daily. ?Start taking on: August 02, 2021 ?  ?methocarbamol 500 MG tablet ?Commonly known as: Robaxin ?Take 1 tablet (500 mg total) by mouth 4 (four) times daily. ?What changed:  ?when to take this ?reasons to take this ?  ?metoprolol succinate 100 MG 24 hr tablet ?Commonly known as: TOPROL-XL ?Take 1 tablet (100 mg total) by mouth daily. Take with or immediately following a meal. ?  ?Myrbetriq 50 MG Tb24 tablet ?Generic drug: mirabegron ER ?Take 50 mg by mouth daily. ?  ?nitroGLYCERIN 0.4 MG SL tablet ?Commonly known as: NITROSTAT ?Place 1 tablet (0.4 mg total) under the tongue every 5 (five) minutes as needed for chest pain. ?  ?olmesartan 20 MG tablet ?Commonly known as: BENICAR ?Take 1 tablet (20 mg total) by mouth daily. ?  ?rosuvastatin 5 MG tablet ?Commonly known as: CRESTOR ?Take 5 mg by mouth daily. ?  ? ?  ? ? Follow-up Information   ? ?  Sandi Mariscal, MD. Schedule an appointment as soon as possible for a visit in 1 week(s).   ?Specialty: Internal Medicine ?Contact information: ?[address] ?[place] [address] ?[phone] ? ? ?  ?  ? ? Care, [facility] Follow up.   ?Specialty: Home Health Services ?Why: Registered Nurse, Physical Therapy, Occupational Therapy, Aide. Office to call with visit times. ?Contact information: ?[address] ?[address] ?[place] [address] ?[phone] ? ? ?  ?  ? ?  ?  ? ?  ? ?Subjective: ?Today, patient was seen and examined at bedside.  Denies any chest pain.  Complains of mild anterior leg pain.  Patient's husband at bedside. ? ?Discharge Exam: ?Filed Weights  ? 07/29/21 2345 07/30/21 2037  ?Weight: 88 kg 85.9 kg  ? ? ?  08/01/2021  ?  9:00 AM 08/01/2021  ?  8:11 AM 08/01/2021  ?  4:43 AM  ?Vitals with BMI  ?Systolic 790 240 973  ?Diastolic 71 64 55  ?Pulse   69  ?  ?General: This built, not in  obvious distress ?HENT:   No scleral pallor or icterus noted. Oral mucosa is moist.  ?Chest:  Clear breath sounds.  Diminished breath sounds bilaterally. No crackles or wheezes.  ?CVS: S1 &S2 heard. No mu

## 2021-08-01 NOTE — Progress Notes (Addendum)
? ?Progress Note ? ?Patient Name: Debbie [name] ?Date of Encounter: 08/01/2021 ? ?[facility] Cardiologist: Shelva Majestic, MD  ? ?Subjective  ? ?Denies any CP or SOB. Significant leg arthritic pain. No SOB.  ? ?Inpatient Medications  ?  ?Scheduled Meds: ? amLODipine  5 mg Oral Daily  ? apixaban  5 mg Oral BID  ? aspirin  81 mg Oral Daily  ? clopidogrel  75 mg Oral Q breakfast  ? ezetimibe  10 mg Oral Daily  ? gabapentin  300 mg Oral BID  ? insulin aspart  0-15 Units Subcutaneous TID WC  ? irbesartan  150 mg Oral Daily  ? metoprolol succinate  50 mg Oral Once  ? mirabegron ER  50 mg Oral Daily  ? multivitamin with minerals  1 tablet Oral Daily  ? rosuvastatin  5 mg Oral Daily  ? sodium chloride flush  3 mL Intravenous Q12H  ? sodium chloride flush  3 mL Intravenous Q12H  ? ?Continuous Infusions: ? sodium chloride    ? sodium chloride    ? ?PRN Meds: ?sodium chloride, acetaminophen, HYDROcodone-acetaminophen, methocarbamol, ondansetron (ZOFRAN) IV, sodium chloride flush  ? ?Vital Signs  ?  ?Vitals:  ? 07/31/21 1952 08/01/21 0004 08/01/21 0443 08/01/21 0867  ?BP: (!) 172/63 (!) 169/78 (!) 145/55 (!) 182/64  ?Pulse: 77 71 69   ?Resp:      ?Temp: 97.6 ?F (36.4 ?C) 98.3 ?F (36.8 ?C) 98.3 ?F (36.8 ?C)   ?TempSrc: Oral Oral Oral   ?SpO2: 98% 94% 92%   ?Weight:      ?Height:      ? ? ?Intake/Output Summary (Last 24 hours) at 08/01/2021 0840 ?Last data filed at 08/01/2021 0444 ?Gross per 24 hour  ?Intake 1484.69 ml  ?Output 1400 ml  ?Net 84.69 ml  ? ? ?  07/30/2021  ?  8:37 PM 07/29/2021  ? 11:45 PM 07/10/2021  ?  4:08 AM  ?Last 3 Weights  ?Weight (lbs) 189 lb 6 oz 194 lb 0.1 oz 194 lb 3.6 oz  ?Weight (kg) 85.9 kg 88 kg 88.1 kg  ?   ? ?Telemetry  ?  ?NSR with HR 60-70s - Personally Reviewed ? ?ECG  ?  ?NSR with TWI in the inferior leads with Q wave - Personally Reviewed ? ?Physical Exam  ? ?GEN: No acute distress.   ?Neck: No JVD ?Cardiac: RRR, no murmurs, rubs, or gallops.  ?Respiratory: Clear to auscultation  bilaterally. ?GI: Soft, nontender, non-distended  ?MS: No edema; No deformity. ?Neuro:  Nonfocal  ?Psych: Normal affect  ? ?Labs  ?  ?High Sensitivity Troponin:   ?Recent Labs  ?Lab 07/09/21 ?1020 07/25/21 ?1834 07/29/21 ?0449 07/29/21 ?0920 07/29/21 ?1344  ?TROPONINIHS 73* 33* 37* 35* 33*  ?   ?Chemistry ?Recent Labs  ?Lab 07/25/21 ?6195 07/29/21 ?0932 07/29/21 ?6712 07/30/21 ?4580 07/30/21 ?9983 07/31/21 ?3825 08/01/21 ?0539  ?NA 141   < >  --    < > 139 139 139  ?K 3.2*   < >  --    < > 3.2* 4.0 3.8  ?CL 113*   < >  --    < > 106 106 111  ?CO2 20*   < >  --    < > 23 22 20*  ?GLUCOSE 117*   < >  --    < > 132* 135* 95  ?BUN 12   < >  --    < > 14 10 5*  ?CREATININE 0.89   < >  --    < >  1.04* 0.97 0.89  ?CALCIUM 9.3   < >  --    < > 9.8 9.4 9.2  ?MG 1.5*  --  1.6*  --  2.4  --   --   ?PROT 6.4*  --  6.7  --   --   --   --   ?ALBUMIN 3.5  --  3.8  --   --   --   --   ?AST 18  --  21  --   --   --   --   ?ALT 21  --  22  --   --   --   --   ?ALKPHOS 127*  --  126  --   --   --   --   ?BILITOT 0.9  --  1.1  --   --   --   --   ?GFRNONAA >60   < >  --    < > 55* >60 >60  ?ANIONGAP 8   < >  --    < > '10 11 8  '$ ? < > = values in this interval not displayed.  ?  ?Lipids  ?Recent Labs  ?Lab 07/30/21 ?0508  ?CHOL 110  ?TRIG 96  ?HDL 44  ?[place] 47  ?CHOLHDL 2.5  ?  ?Hematology ?Recent Labs  ?Lab 07/30/21 ?0508 07/31/21 ?0456 08/01/21 ?0328  ?WBC 7.0 6.6 6.2  ?RBC 3.52*  3.45* 3.62* 3.53*  ?HGB 10.7* 10.9* 10.7*  ?HCT 35.2* 33.8* 32.9*  ?MCV 100.0 93.4 93.2  ?MCH 30.4 30.1 30.3  ?MCHC 30.4 32.2 32.5  ?RDW 15.2 14.9 14.9  ?PLT 274 287 251  ? ?Thyroid  ?Recent Labs  ?Lab 07/25/21 ?1834  ?TSH 1.401  ?FREET4 1.09  ?  ?BNP ?Recent Labs  ?Lab 07/25/21 ?1834  ?BNP 242.1*  ?  ?DDimer No results for input(s): DDIMER in the last 168 hours.  ? ?Radiology  ?  ?CARDIAC CATHETERIZATION ? ?Result Date: 07/31/2021 ?  Mid RCA lesion is 50% stenosed.   Prox RCA lesion is 80% stenosed.   Previously placed Ost RCA to Prox RCA stent (unknown  type) is  widely patent.   Previously placed Dist RCA stent (unknown type) is  widely patent.   A drug-eluting stent was successfully placed.   Post intervention, there is a 0% residual stenosis.   Post intervention, there is a 0% residual stenosis.   Post intervention, there is a 0% residual stenosis. Normal normal LAD and left circumflex coronary arteries. The right coronary artery had a previously placed patent proximal stent, 80% stenosis between the proximal and mid stent with 50% in-stent narrowing in the mid stent and a patent distal third stent. LVEDP 17 mmHg. Successful percutaneous coronary intervention to the RCA with insertion of a 2.5 x 18 mm Medtronic Onyx Frontier stent was dilated to 2.76 mm and PTCA of the mid in-stent 50 to 60% stenosis. RECOMMENDATION: Recommend resumption of Eliquis commencing tomorrow with initial triple drug therapy for 1 month followed by Plavix/Eliquis.  The patient presented with significant hypertension and will need optimal blood pressure control.  Aggressive lipid-lowering therapy with target LDL less than 70.   ? ?Cardiac Studies  ? ?Echo 07/09/2021 ? 1. Left ventricular ejection fraction, by estimation, is 55 to 60%. The  ?left ventricle has normal function. The left ventricle has no regional  ?wall motion abnormalities. Left ventricular diastolic function could not  ?be evaluated.  ? 2. Right ventricular systolic function is normal. The right  ventricular  ?size is normal.  ? 3. The mitral valve is normal in structure. Trivial mitral valve  ?regurgitation. No evidence of mitral stenosis.  ? 4. The aortic valve is tricuspid. Aortic valve regurgitation is not  ?visualized. No aortic stenosis is present.  ? 5. The inferior vena cava is normal in size with greater than 50%  ?respiratory variability, suggesting right atrial pressure of 3 mmHg.  ? ?Comparison(s): No prior Echocardiogram.  ? ?Patient Profile  ?   ?76 y.o. female with PMH of remote stroke (1998), CAD (3 stents  to RCA 2001, last cath 2006 showed patent stent), DM II, HTN, HLD, PAF who presented with chest discomfort that is reminiscent of the previous angina.  ? ?Assessment & Plan  ?  ?Chest pain concerning for unstable angina ?            - serial trop 30s x 2. ?            - given the similarity of chest pain with previous angina, patient underwent cath on 07/31/2021 which showed 80% prox RCA lesion between the proximal and mid stent with 50% ISR in the mid stent, patent distal third RCA stent. RCA treated with 2.5 x 18 mm Medtronic Onyx Frontier stent.  ? - given the need for eliquis, plan triple therapy ASA, plavix and Eliquis for 1 month, at which time, ASA will be dropped and she will continue on plavix and Eliquis after that.  ? - likely can be discharged today ? ?CAD s/p PCI ?            - 3 stents to RCA 2001, last cath 2006 showed patent stent ?  ?HTN: BP elevated. Toprol decreased to '50mg'$  daily. Added amlodipine '5mg'$  yesterday, BP still elevated, will give another '5mg'$  amlodipine today and increase amlodipine to '10mg'$  daily.  ?  ?HLD: Zetia and crestor ?  ?DM II ?  ?PAF: recently diagnosed. Eliquis held prior to cath, restarted this morning.  ? ?   ? ?For questions or updates, please contact [place] ?Please consult [email or web address] for contact info under  ? ?  ?   ?Signed, ?Almyra Deforest, [place]  ?08/01/2021, 8:40 AM   ? ?I have seen and examined the patient along with Almyra Deforest, PA .  I have reviewed the chart, notes and data.  I agree with PA/NP's note. ? ?Key new complaints: feels well. No angina and no problems at radial cath site ?Key examination changes: normal CV exam, BP improving ?Key new findings / data: cath results reviewed with patient ? ?PLAN: ?Reinforced plan for uninterrupted ASA+clopidogrel+Eliquis for 30 days, stop ASA after 30 days, continue clopidogrel for at least 12 months. ?Recommended rehab, but knee pain may interfere with that.  ?Preferably delay knee surgery for at least 6 months. ? ?Sanda Klein, MD, [facility] ?[facility] ?[phone] ?08/01/2021, 11:35 AM ? ? ?

## 2021-08-02 ENCOUNTER — Encounter (HOSPITAL_COMMUNITY): Payer: Self-pay | Admitting: Emergency Medicine

## 2021-08-02 ENCOUNTER — Emergency Department (HOSPITAL_COMMUNITY): Payer: Medicare Other

## 2021-08-02 ENCOUNTER — Inpatient Hospital Stay (HOSPITAL_COMMUNITY)
Admission: EM | Admit: 2021-08-02 | Discharge: 2021-08-05 | DRG: 559 | Disposition: A | Discharge status: Skilled Nursing Facility | Payer: Medicare Other | Attending: Family Medicine | Admitting: Family Medicine

## 2021-08-02 DIAGNOSIS — M48062 Spinal stenosis, lumbar region with neurogenic claudication: Secondary | ICD-10-CM | POA: Diagnosis not present

## 2021-08-02 DIAGNOSIS — R29898 Other symptoms and signs involving the musculoskeletal system: Secondary | ICD-10-CM | POA: Diagnosis present

## 2021-08-02 DIAGNOSIS — R531 Weakness: Principal | ICD-10-CM

## 2021-08-02 DIAGNOSIS — Z7901 Long term (current) use of anticoagulants: Secondary | ICD-10-CM | POA: Diagnosis not present

## 2021-08-02 DIAGNOSIS — Z955 Presence of coronary angioplasty implant and graft: Secondary | ICD-10-CM

## 2021-08-02 DIAGNOSIS — I214 Non-ST elevation (NSTEMI) myocardial infarction: Secondary | ICD-10-CM | POA: Diagnosis not present

## 2021-08-02 DIAGNOSIS — I2510 Atherosclerotic heart disease of native coronary artery without angina pectoris: Secondary | ICD-10-CM | POA: Diagnosis not present

## 2021-08-02 DIAGNOSIS — Z8673 Personal history of transient ischemic attack (TIA), and cerebral infarction without residual deficits: Secondary | ICD-10-CM | POA: Diagnosis not present

## 2021-08-02 DIAGNOSIS — I10 Essential (primary) hypertension: Secondary | ICD-10-CM | POA: Diagnosis present

## 2021-08-02 DIAGNOSIS — E669 Obesity, unspecified: Secondary | ICD-10-CM | POA: Diagnosis not present

## 2021-08-02 DIAGNOSIS — E1165 Type 2 diabetes mellitus with hyperglycemia: Secondary | ICD-10-CM | POA: Diagnosis present

## 2021-08-02 DIAGNOSIS — E7800 Pure hypercholesterolemia, unspecified: Secondary | ICD-10-CM | POA: Diagnosis present

## 2021-08-02 DIAGNOSIS — I130 Hypertensive heart and chronic kidney disease with heart failure and stage 1 through stage 4 chronic kidney disease, or unspecified chronic kidney disease: Secondary | ICD-10-CM | POA: Diagnosis present

## 2021-08-02 DIAGNOSIS — W19XXXA Unspecified fall, initial encounter: Secondary | ICD-10-CM | POA: Diagnosis present

## 2021-08-02 DIAGNOSIS — Z7902 Long term (current) use of antithrombotics/antiplatelets: Secondary | ICD-10-CM

## 2021-08-02 DIAGNOSIS — I5032 Chronic diastolic (congestive) heart failure: Secondary | ICD-10-CM | POA: Diagnosis not present

## 2021-08-02 DIAGNOSIS — M5116 Intervertebral disc disorders with radiculopathy, lumbar region: Secondary | ICD-10-CM | POA: Diagnosis not present

## 2021-08-02 DIAGNOSIS — Y792 Prosthetic and other implants, materials and accessory orthopedic devices associated with adverse incidents: Secondary | ICD-10-CM | POA: Diagnosis not present

## 2021-08-02 DIAGNOSIS — I480 Paroxysmal atrial fibrillation: Secondary | ICD-10-CM | POA: Diagnosis present

## 2021-08-02 DIAGNOSIS — E860 Dehydration: Secondary | ICD-10-CM | POA: Diagnosis present

## 2021-08-02 DIAGNOSIS — D72829 Elevated white blood cell count, unspecified: Secondary | ICD-10-CM | POA: Diagnosis present

## 2021-08-02 DIAGNOSIS — Z7984 Long term (current) use of oral hypoglycemic drugs: Secondary | ICD-10-CM

## 2021-08-02 DIAGNOSIS — Z9842 Cataract extraction status, left eye: Secondary | ICD-10-CM

## 2021-08-02 DIAGNOSIS — D631 Anemia in chronic kidney disease: Secondary | ICD-10-CM | POA: Diagnosis not present

## 2021-08-02 DIAGNOSIS — F17210 Nicotine dependence, cigarettes, uncomplicated: Secondary | ICD-10-CM | POA: Diagnosis present

## 2021-08-02 DIAGNOSIS — E1122 Type 2 diabetes mellitus with diabetic chronic kidney disease: Secondary | ICD-10-CM | POA: Diagnosis present

## 2021-08-02 DIAGNOSIS — Z7982 Long term (current) use of aspirin: Secondary | ICD-10-CM

## 2021-08-02 DIAGNOSIS — R5381 Other malaise: Secondary | ICD-10-CM | POA: Diagnosis present

## 2021-08-02 DIAGNOSIS — R269 Unspecified abnormalities of gait and mobility: Secondary | ICD-10-CM | POA: Diagnosis not present

## 2021-08-02 DIAGNOSIS — I5A Non-ischemic myocardial injury (non-traumatic): Secondary | ICD-10-CM

## 2021-08-02 DIAGNOSIS — G629 Polyneuropathy, unspecified: Secondary | ICD-10-CM

## 2021-08-02 DIAGNOSIS — Z981 Arthrodesis status: Secondary | ICD-10-CM

## 2021-08-02 DIAGNOSIS — Z90710 Acquired absence of both cervix and uterus: Secondary | ICD-10-CM

## 2021-08-02 DIAGNOSIS — Z79899 Other long term (current) drug therapy: Secondary | ICD-10-CM

## 2021-08-02 DIAGNOSIS — I252 Old myocardial infarction: Secondary | ICD-10-CM

## 2021-08-02 DIAGNOSIS — T84296A Other mechanical complication of internal fixation device of vertebrae, initial encounter: Principal | ICD-10-CM | POA: Diagnosis present

## 2021-08-02 DIAGNOSIS — Z6831 Body mass index (BMI) 31.0-31.9, adult: Secondary | ICD-10-CM | POA: Diagnosis not present

## 2021-08-02 DIAGNOSIS — Z9049 Acquired absence of other specified parts of digestive tract: Secondary | ICD-10-CM | POA: Diagnosis not present

## 2021-08-02 DIAGNOSIS — E785 Hyperlipidemia, unspecified: Secondary | ICD-10-CM | POA: Diagnosis present

## 2021-08-02 DIAGNOSIS — Z8249 Family history of ischemic heart disease and other diseases of the circulatory system: Secondary | ICD-10-CM

## 2021-08-02 DIAGNOSIS — Z833 Family history of diabetes mellitus: Secondary | ICD-10-CM

## 2021-08-02 DIAGNOSIS — Z90722 Acquired absence of ovaries, bilateral: Secondary | ICD-10-CM

## 2021-08-02 DIAGNOSIS — D649 Anemia, unspecified: Secondary | ICD-10-CM | POA: Diagnosis present

## 2021-08-02 DIAGNOSIS — E119 Type 2 diabetes mellitus without complications: Secondary | ICD-10-CM

## 2021-08-02 DIAGNOSIS — Z9841 Cataract extraction status, right eye: Secondary | ICD-10-CM

## 2021-08-02 DIAGNOSIS — N179 Acute kidney failure, unspecified: Secondary | ICD-10-CM

## 2021-08-02 DIAGNOSIS — N1831 Chronic kidney disease, stage 3a: Secondary | ICD-10-CM | POA: Insufficient documentation

## 2021-08-02 LAB — COMPREHENSIVE METABOLIC PANEL
ALT: 27 U/L (ref 0–44)
AST: 34 U/L (ref 15–41)
Albumin: 4 g/dL (ref 3.5–5.0)
Alkaline Phosphatase: 119 U/L (ref 38–126)
Anion gap: 8 (ref 5–15)
BUN: 10 mg/dL (ref 8–23)
CO2: 22 mmol/L (ref 22–32)
Calcium: 10.2 mg/dL (ref 8.9–10.3)
Chloride: 110 mmol/L (ref 98–111)
Creatinine, Ser: 1.22 mg/dL — ABNORMAL HIGH (ref 0.44–1.00)
GFR, Estimated: 46 mL/min — ABNORMAL LOW (ref >60)
Glucose, Bld: 122 mg/dL — ABNORMAL HIGH (ref 70–99)
Potassium: 4 mmol/L (ref 3.5–5.1)
Sodium: 140 mmol/L (ref 135–145)
Total Bilirubin: 0.9 mg/dL (ref 0.3–1.2)
Total Protein: 6.9 g/dL (ref 6.5–8.1)

## 2021-08-02 LAB — CBC WITH DIFFERENTIAL/PLATELET
Abs Immature Granulocytes: 0.03 10*3/uL (ref 0.00–0.07)
Basophils Absolute: 0 10*3/uL (ref 0.0–0.1)
Basophils Relative: 0 %
Eosinophils Absolute: 0.1 10*3/uL (ref 0.0–0.5)
Eosinophils Relative: 1 %
HCT: 35.9 % — ABNORMAL LOW (ref 36.0–46.0)
Hemoglobin: 11.2 g/dL — ABNORMAL LOW (ref 12.0–15.0)
Immature Granulocytes: 0 %
Lymphocytes Relative: 27 %
Lymphs Abs: 3.1 10*3/uL (ref 0.7–4.0)
MCH: 29.9 pg (ref 26.0–34.0)
MCHC: 31.2 g/dL (ref 30.0–36.0)
MCV: 95.7 fL (ref 80.0–100.0)
Monocytes Absolute: 1 10*3/uL (ref 0.1–1.0)
Monocytes Relative: 9 %
Neutro Abs: 6.9 10*3/uL (ref 1.7–7.7)
Neutrophils Relative %: 63 %
Platelets: 294 10*3/uL (ref 150–400)
RBC: 3.75 MIL/uL — ABNORMAL LOW (ref 3.87–5.11)
RDW: 15.4 % (ref 11.5–15.5)
WBC: 11.1 10*3/uL — ABNORMAL HIGH (ref 4.0–10.5)
nRBC: 0 % (ref 0.0–0.2)

## 2021-08-02 LAB — CBG MONITORING, ED: Glucose-Capillary: 97 mg/dL (ref 70–99)

## 2021-08-02 LAB — TROPONIN I (HIGH SENSITIVITY): Troponin I (High Sensitivity): 54 ng/L — ABNORMAL HIGH (ref <18)

## 2021-08-02 IMAGING — DX DG KNEE COMPLETE 4+V*R*
4 series · 4 of 4 positions shown · non-contrast
Comparison: None.

CLINICAL DATA: Right knee pain following fall.

EXAM:
RIGHT KNEE - COMPLETE 4+ VIEW

[knee ap]
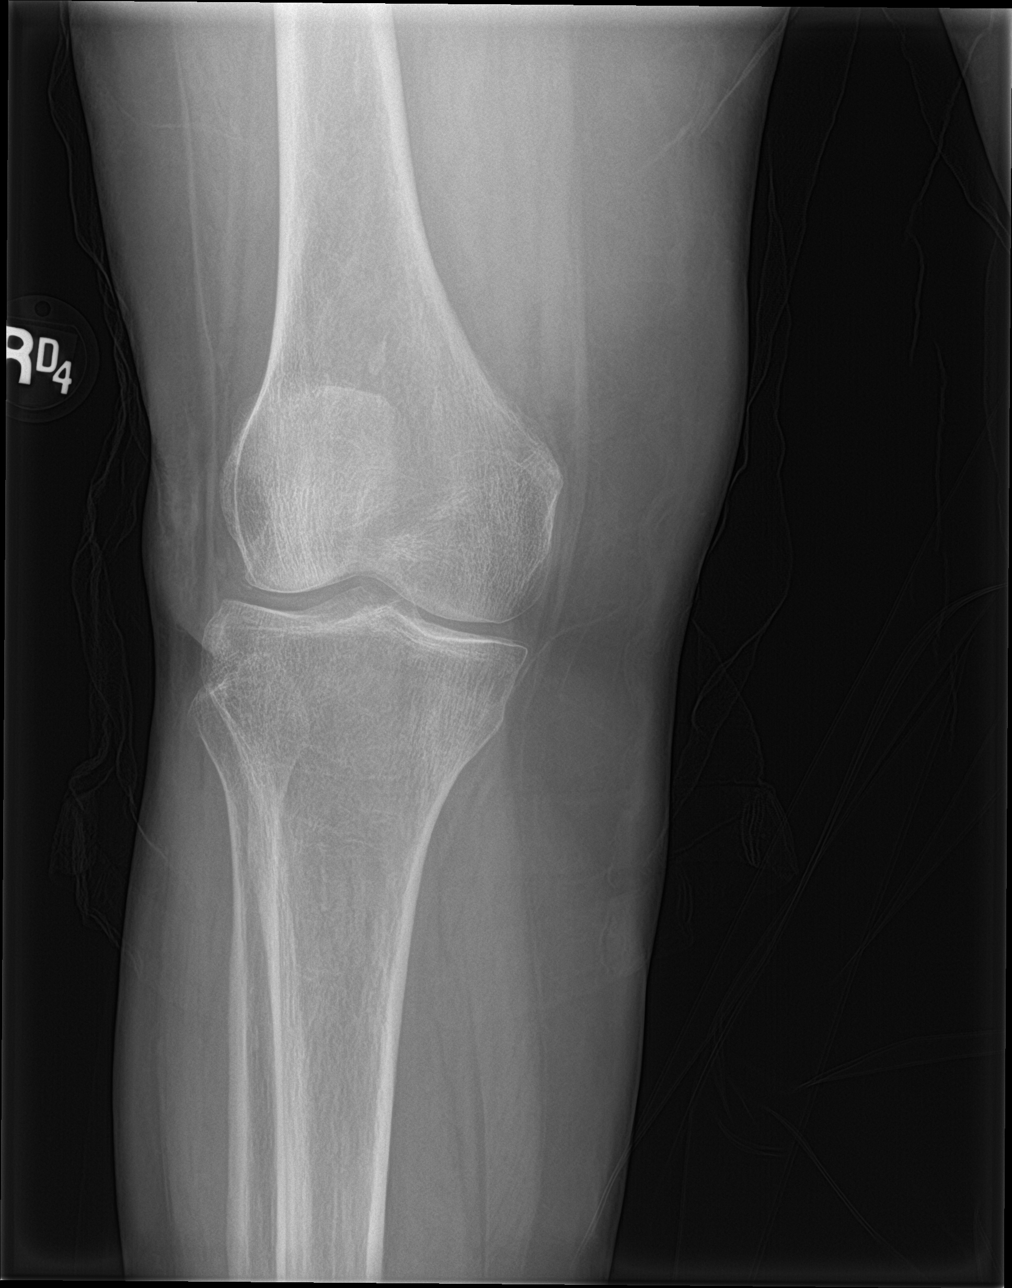

[knee obl (1 of 2)]
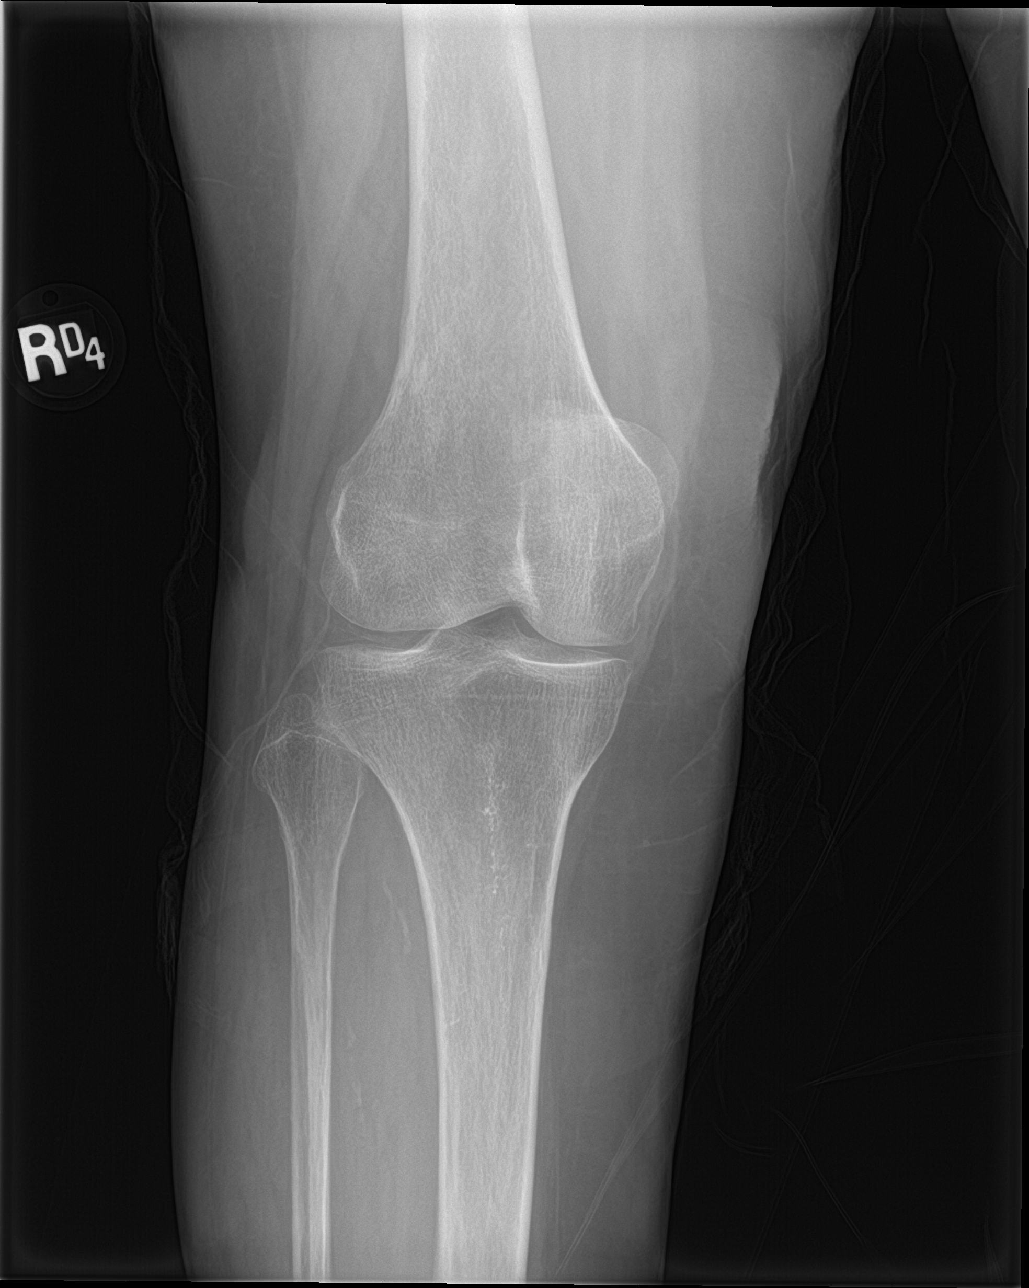

[knee obl (2 of 2)]
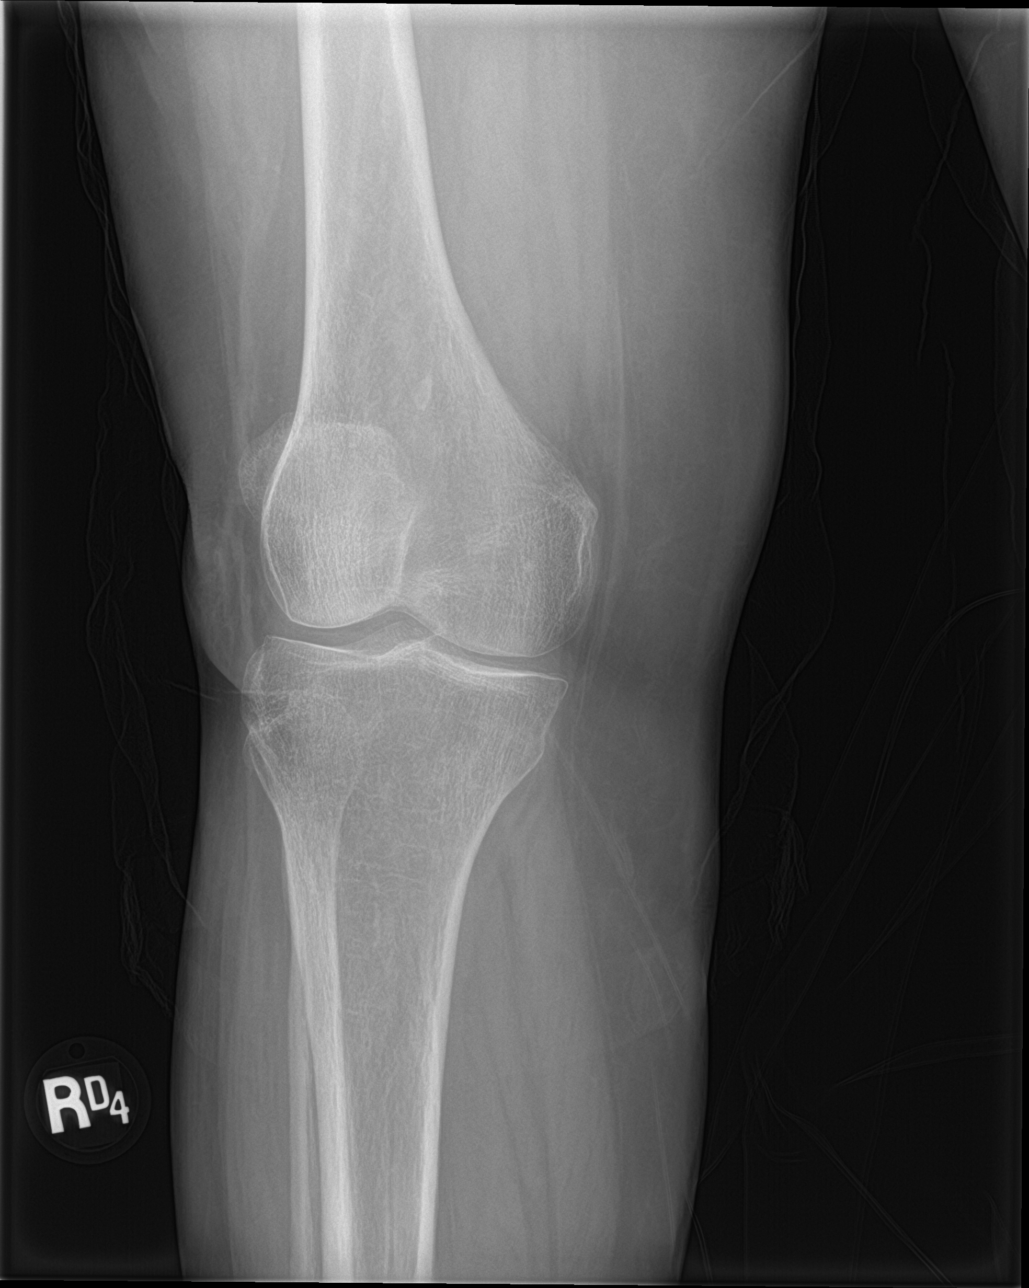

[knee lat]
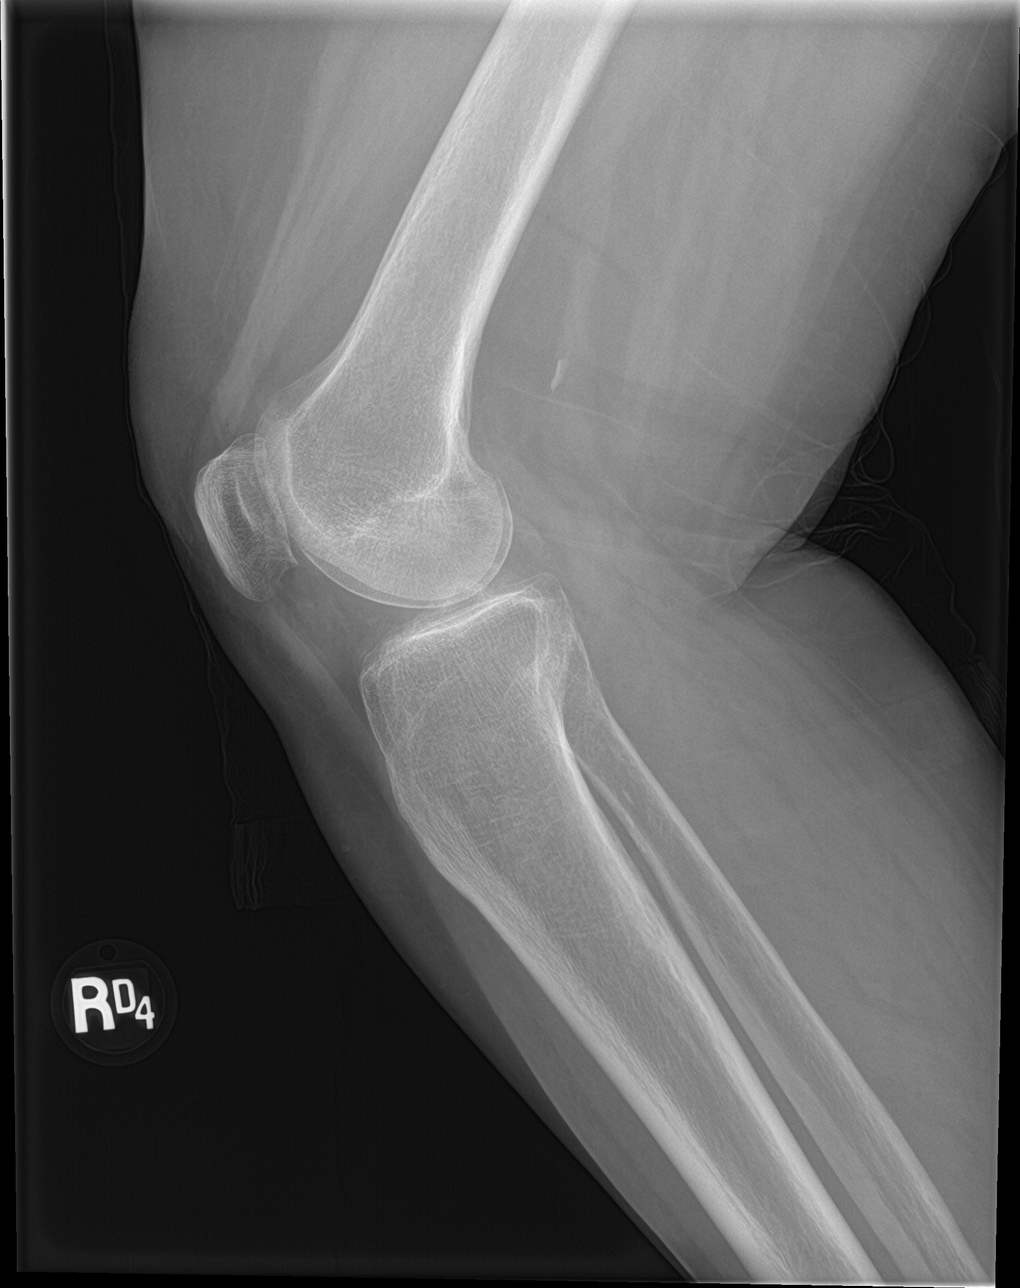

[4 of 4 positions shown; findings below may reference images not displayed]

FINDINGS: Soft tissue swelling about the anterior superior aspect of the tibia
without associated fracture or radiopaque foreign body. No definite
knee joint effusion. Joint spaces appear preserved however a small
amount of chondrocalcinosis is seen within both the medial and
lateral joint spaces.
IMPRESSION: 1. Soft tissue swelling about the anterior aspect the knee without
associated fracture or dislocation.
2. Chondrocalcinosis as could be seen in the setting of CPPD.

## 2021-08-02 IMAGING — DX DG ANKLE COMPLETE 3+V*R*
3 series · 3 of 3 positions shown · non-contrast
Comparison: None.

CLINICAL DATA: Fall, right ankle pain fall are fall pain there is

EXAM:
RIGHT ANKLE - COMPLETE 3+ VIEW

[ankle ap]
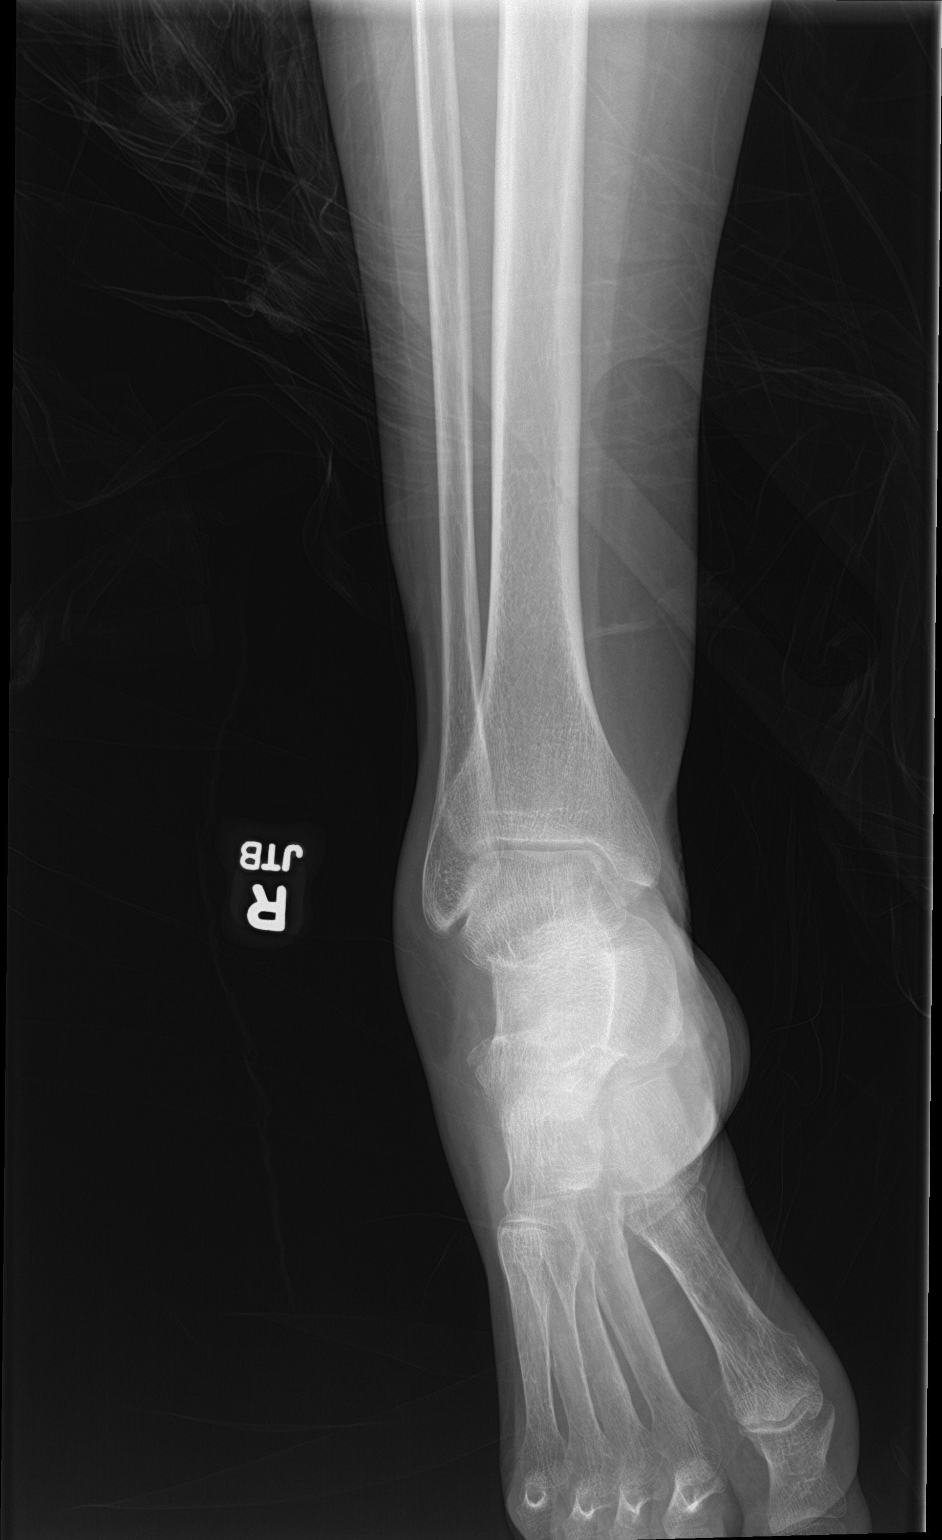

[ankle obl]
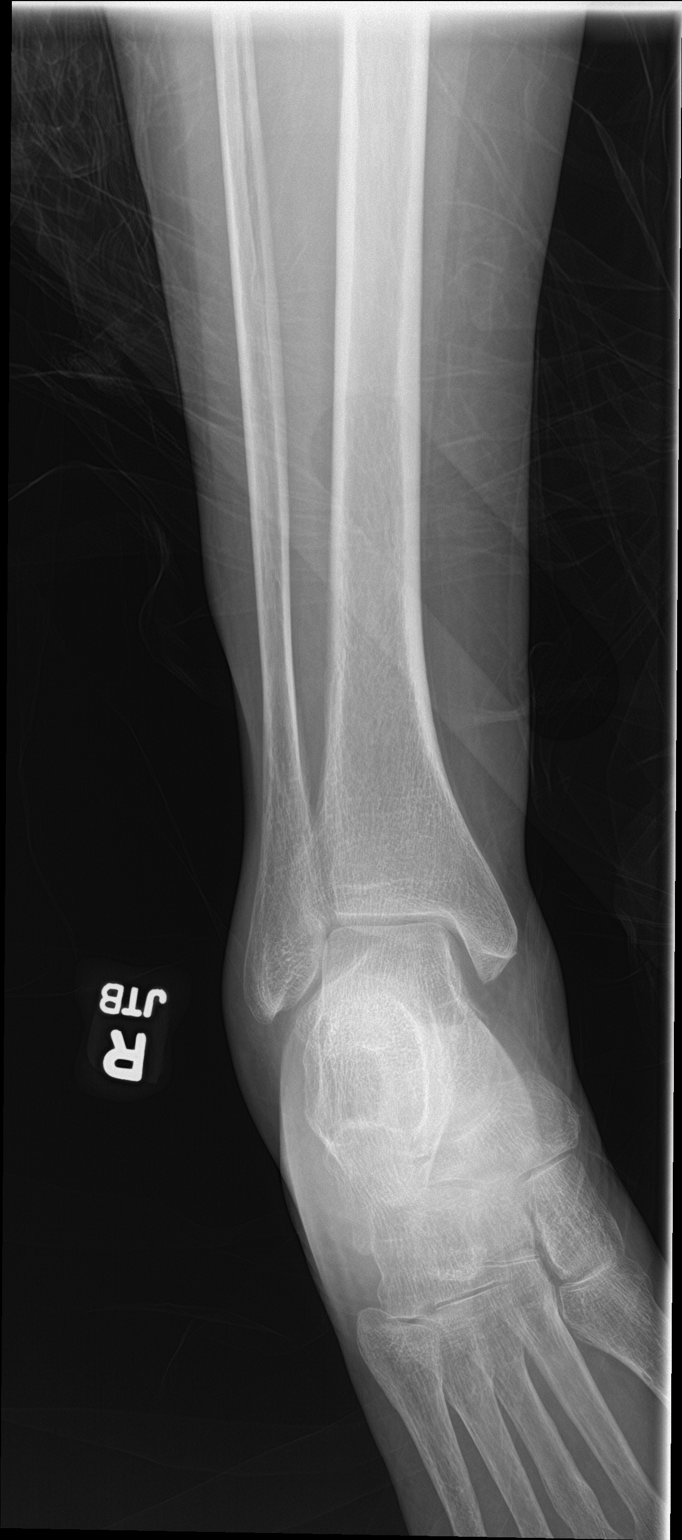

[ankle lat]
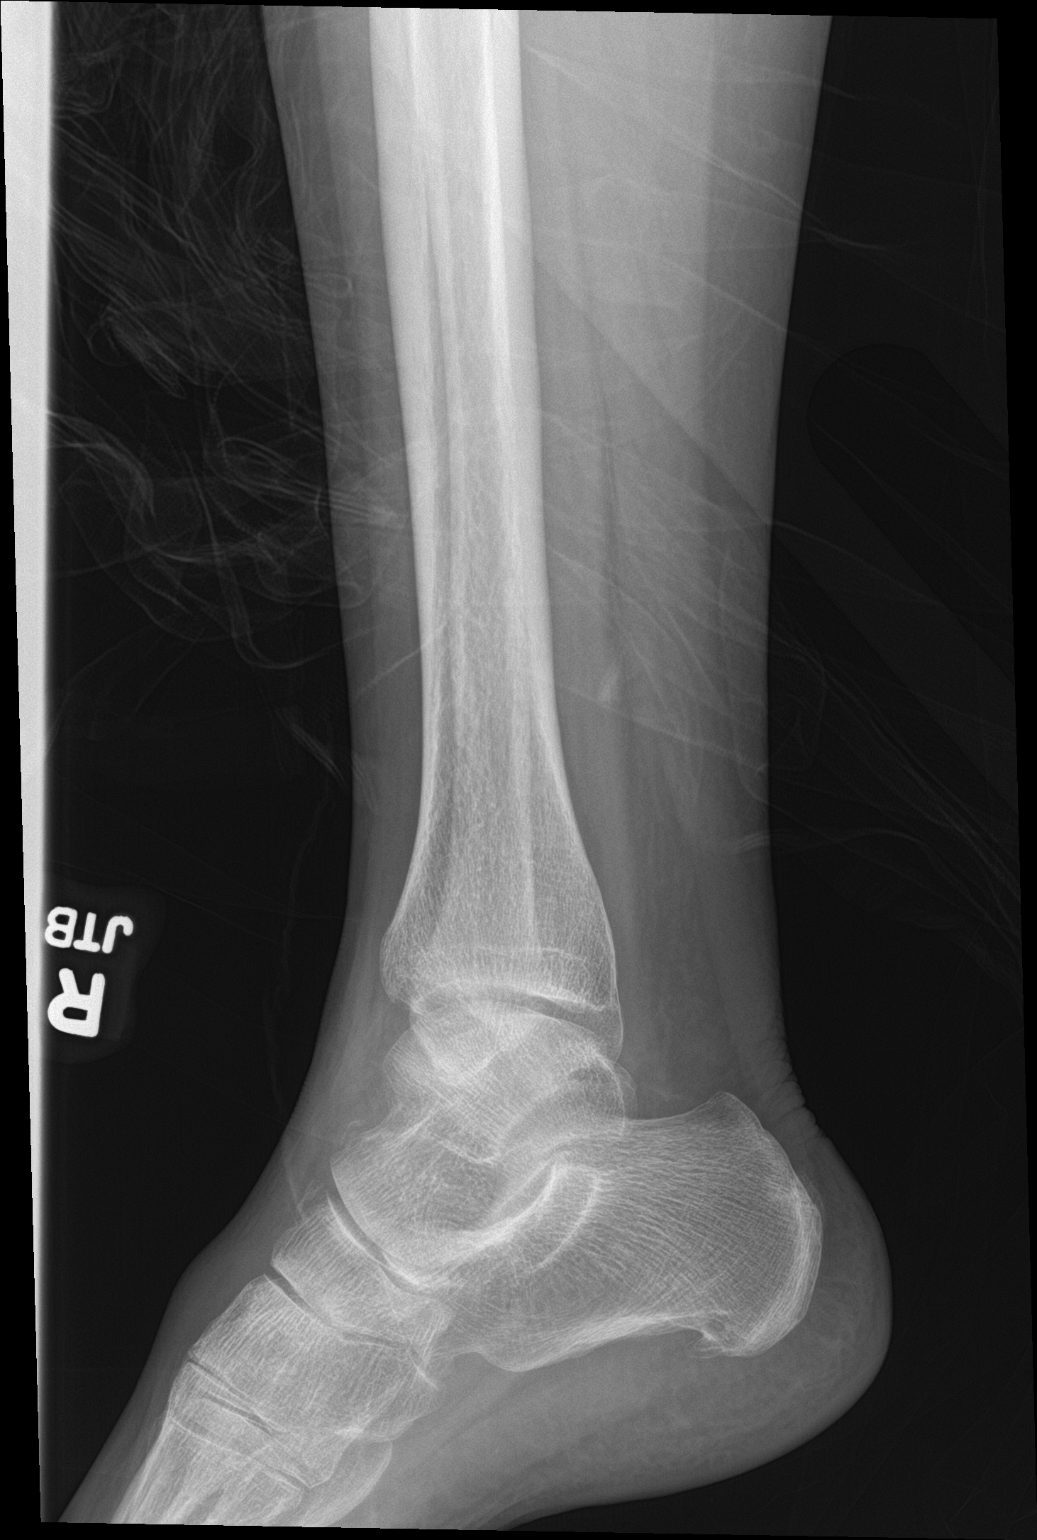

[3 of 3 positions shown; findings below may reference images not displayed]

FINDINGS: There is mild soft tissue swelling dorsal to the kidney a forms.
There is a small ossific fracture fragment arising from the dorsal
aspect of the anterior process of the talus, best seen on sagittal
view which may represent a small capsular avulsive fracture. No
other fracture identified. Normal overall alignment. Mild soft
tissue swelling superficial to the lateral malleolus. No ankle
effusion.
IMPRESSION: Soft tissue swelling dorsal to the lateral malleolus and midfoot.

Possible small avulsive fracture fragment from the dorsal aspect of
the anterior process of the talus. Correlation for point tenderness
would be helpful in determining acuity.

## 2021-08-02 IMAGING — DX DG TIBIA/FIBULA 2V*L*
4 series · 4 of 4 positions shown · non-contrast
Comparison: None.

CLINICAL DATA: Left ankle pain following fall

EXAM:
LEFT TIBIA AND FIBULA - 2 VIEW

[tibia ap (1 of 2)]
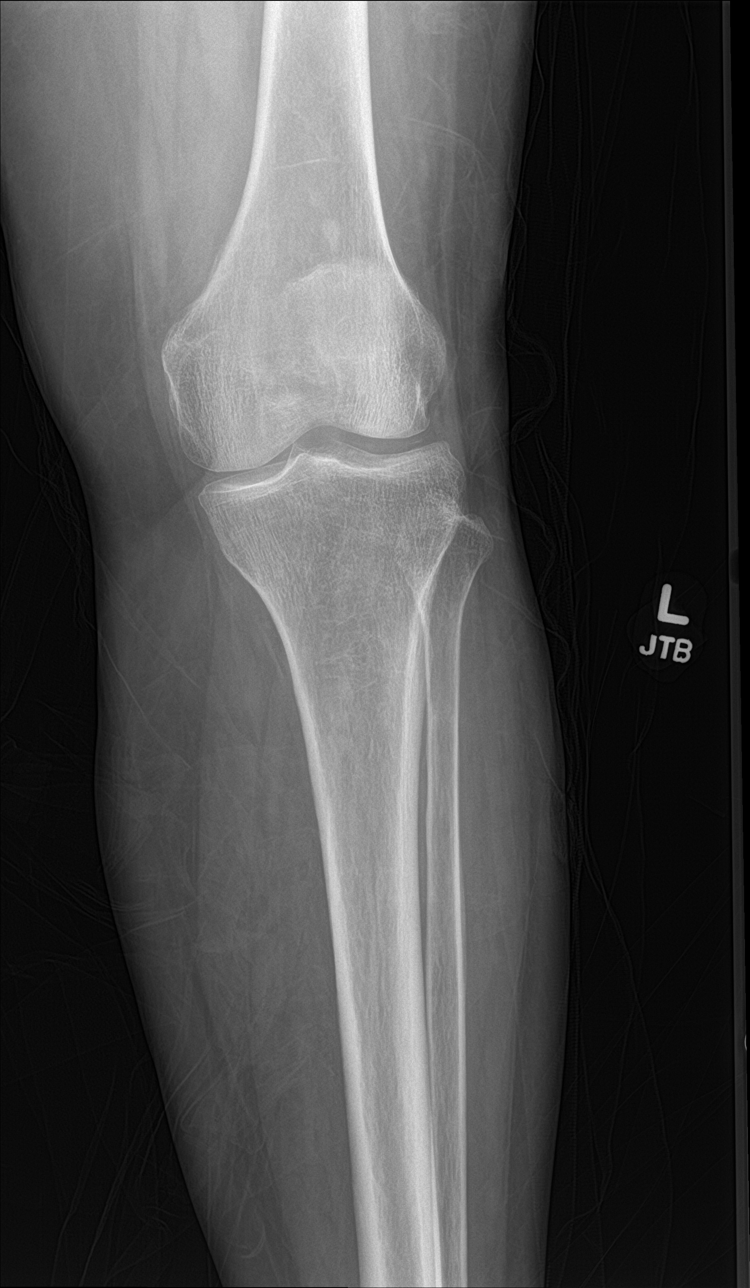

[tibia ap (2 of 2)]
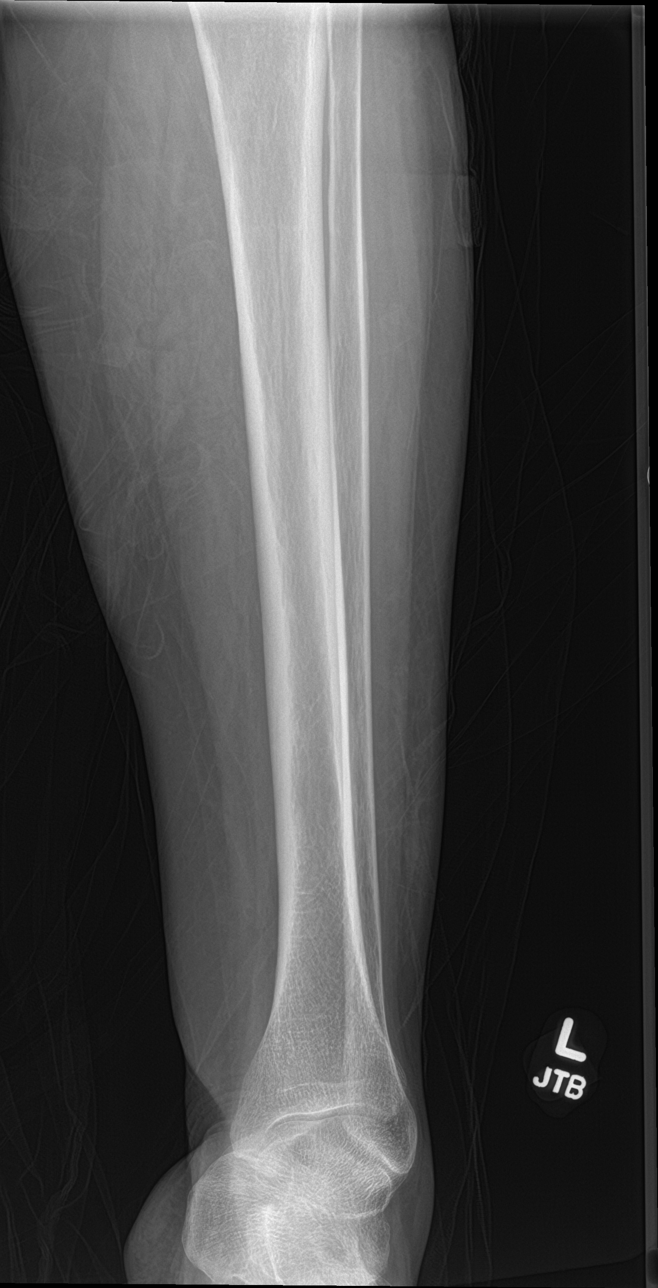

[tibia lat (1 of 2)]
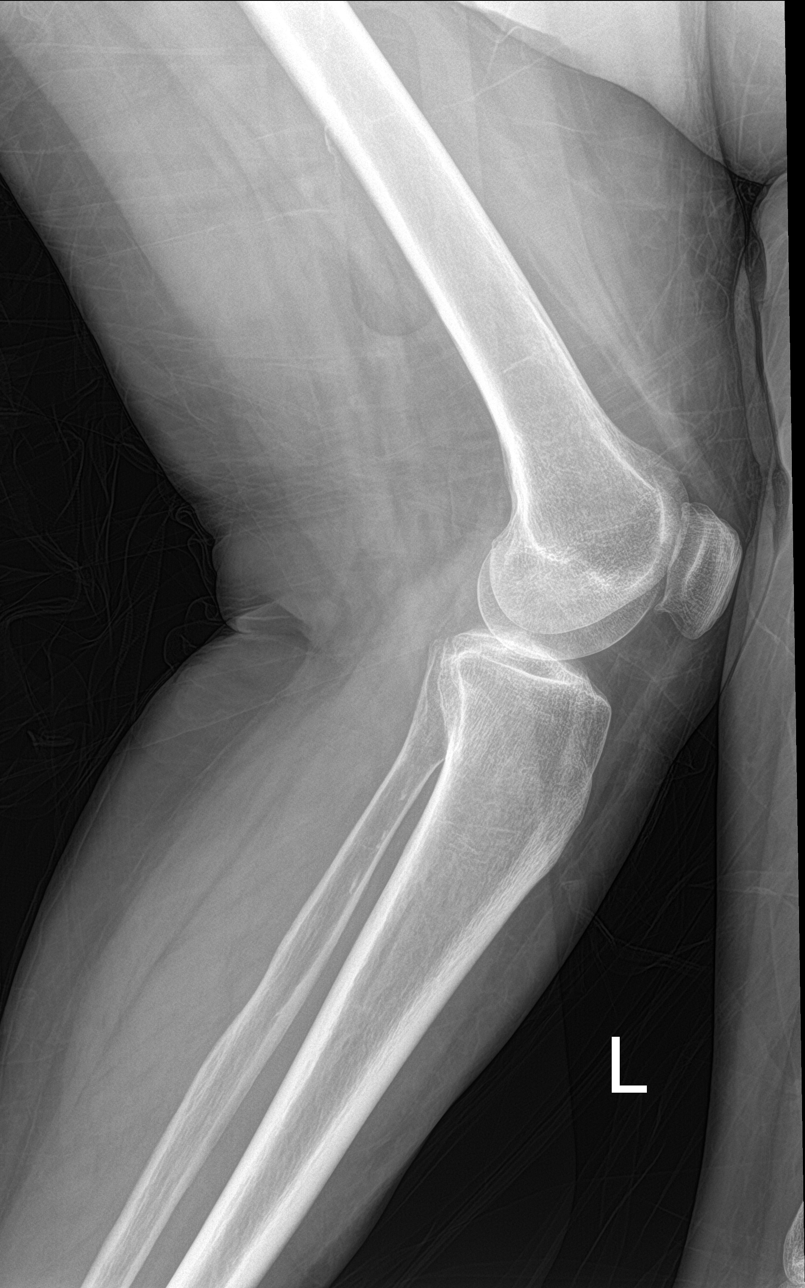

[tibia lat (2 of 2)]
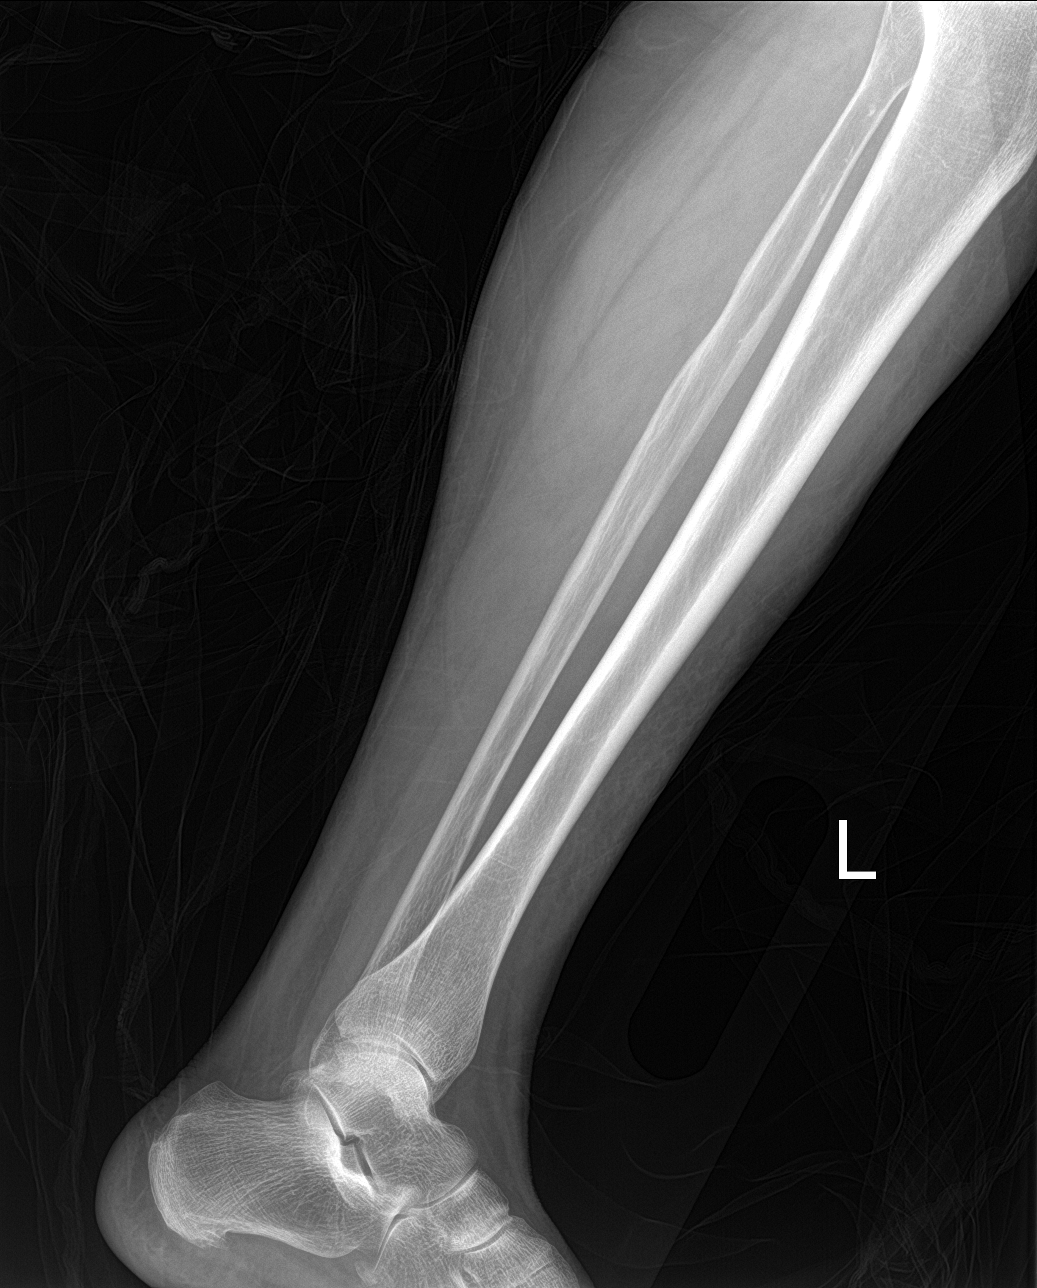

[4 of 4 positions shown; findings below may reference images not displayed]

FINDINGS: There is no evidence of fracture or other focal bone lesions. Soft
tissues are unremarkable.
IMPRESSION: Negative.

## 2021-08-02 IMAGING — DX DG CHEST 1V PORT
1 series · 1 of 1 positions shown · non-contrast
Comparison: None.

CLINICAL DATA: Fall, chest pain

EXAM:
PORTABLE CHEST 1 VIEW

[chest ap]
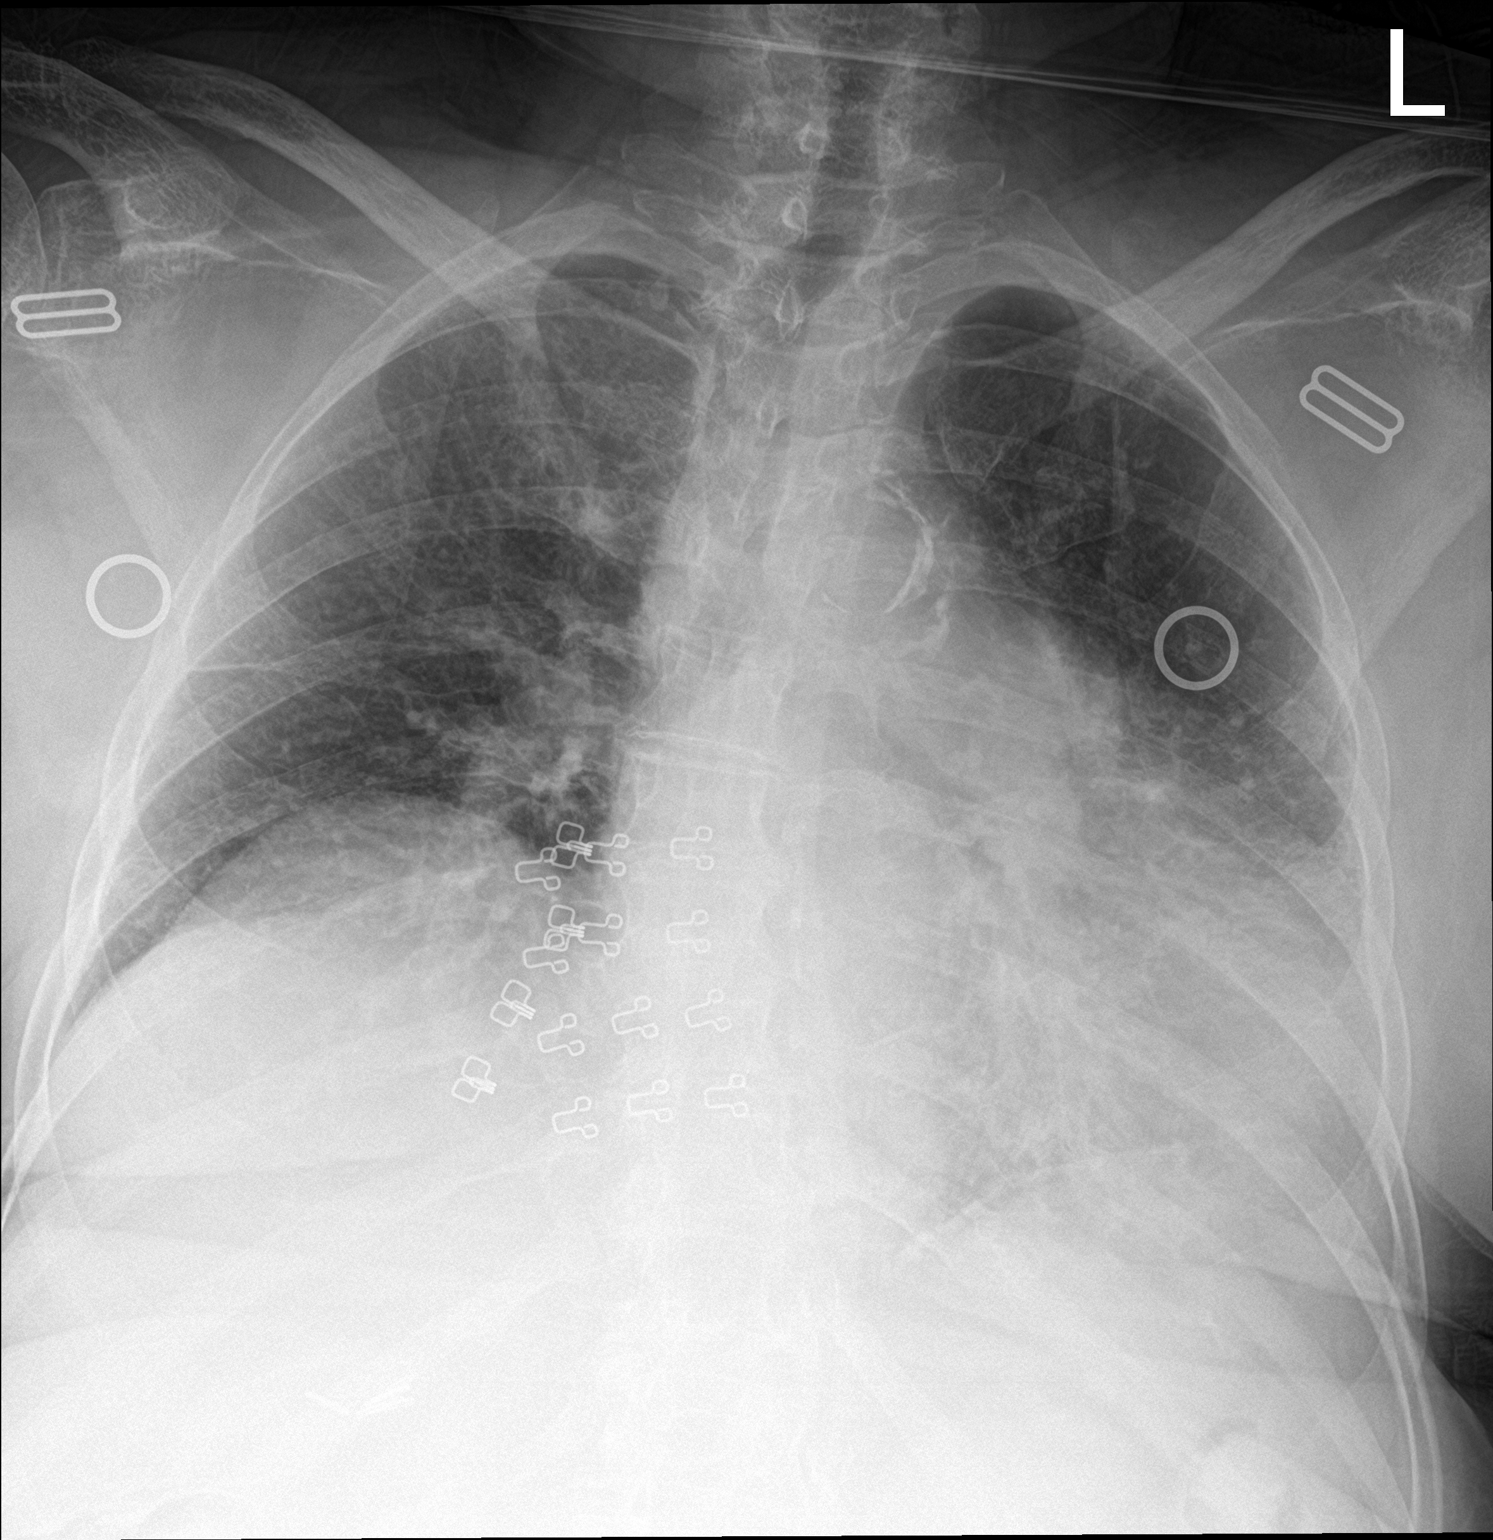

[1 of 1 positions shown; findings below may reference images not displayed]

FINDINGS: Lung volumes are small. Interstitial opacities within the left lung
base are chronic in nature in keeping with mild subpleural fibrosis
better seen on CT examination of [DATE]. No superimposed
confluent pulmonary infiltrate. No pneumothorax or pleural effusion.
Cardiac size is mildly enlarged, unchanged. No acute bone
abnormality.
IMPRESSION: No radiographic evidence of acute cardiopulmonary disease.

Stable cardiomegaly.

## 2021-08-02 IMAGING — DX DG LUMBAR SPINE COMPLETE 4+V
5 series · 5 of 5 positions shown · non-contrast
Comparison: [DATE]

CLINICAL DATA: Fall, back pain

EXAM:
LUMBAR SPINE - COMPLETE 4+ VIEW

[l-spine ap]
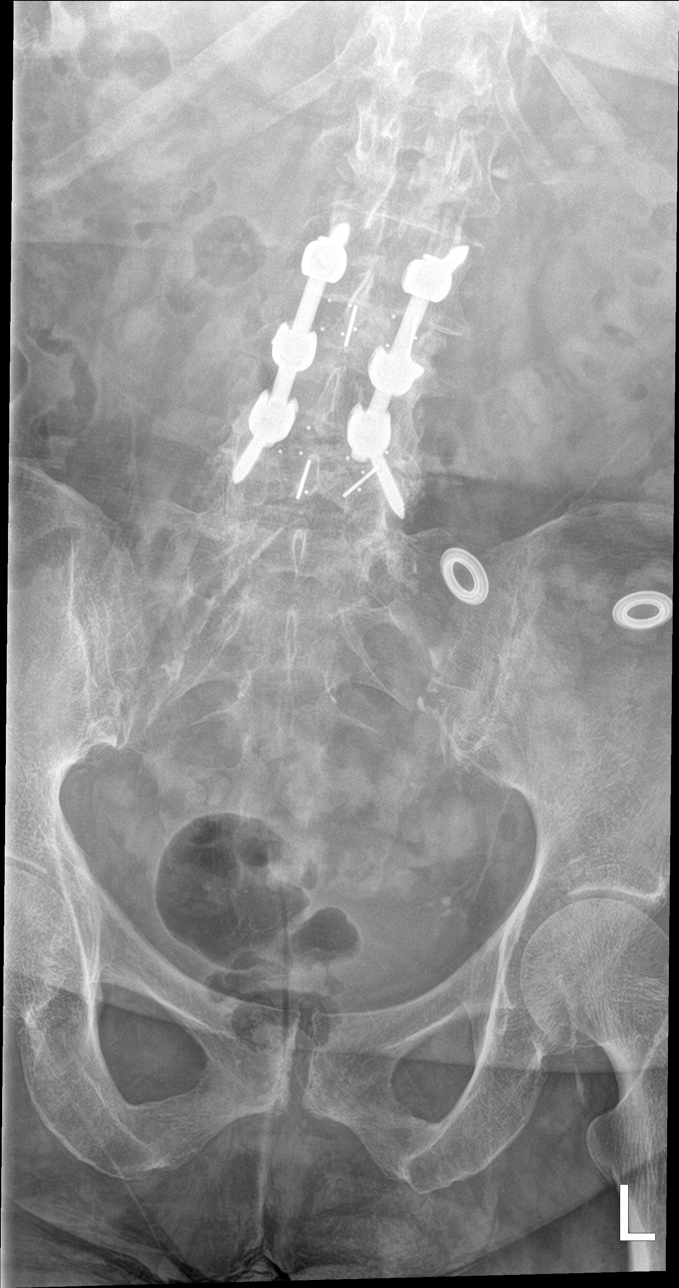

[l-spine obl (1 of 2)]
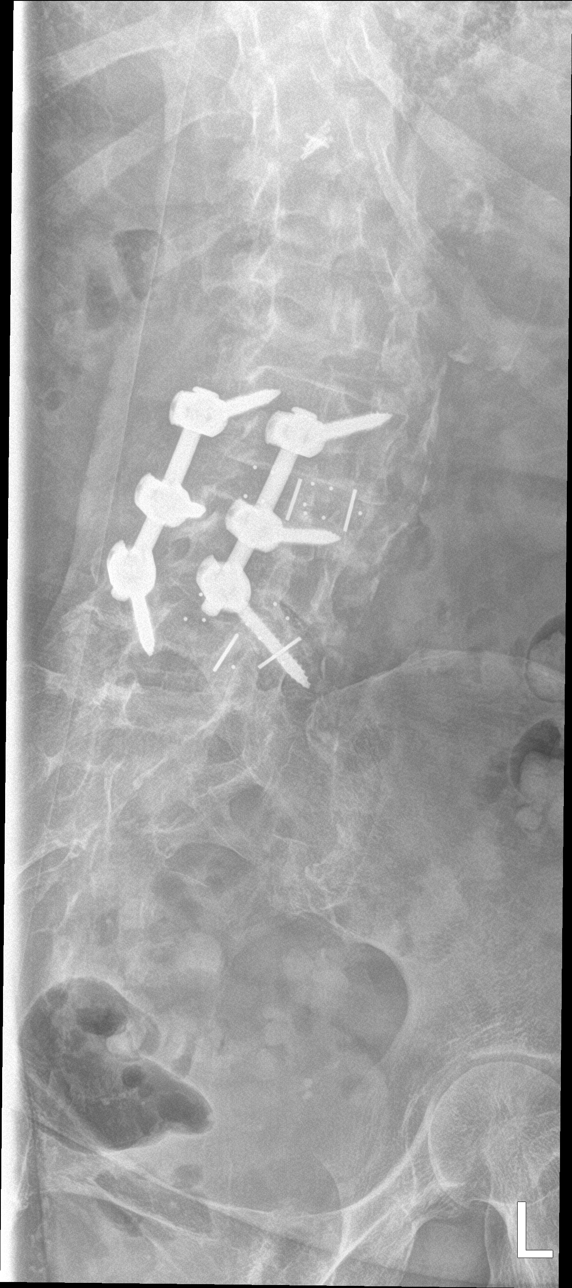

[l-spine obl (2 of 2)]
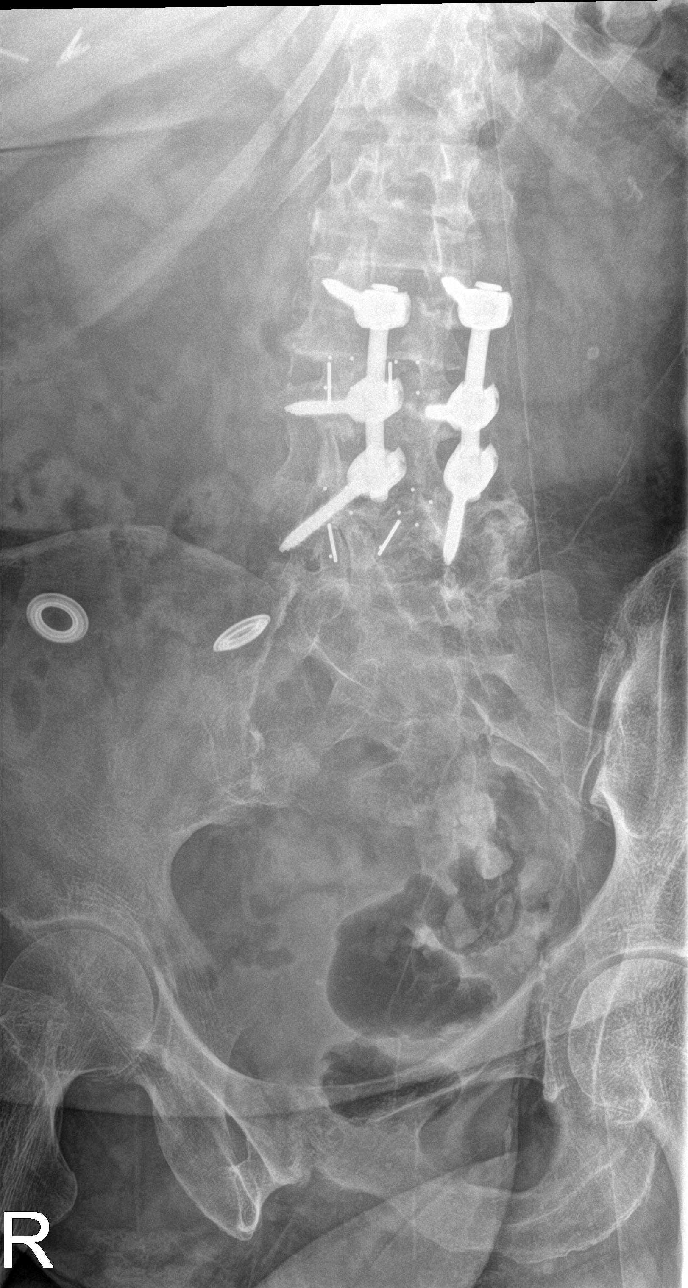

[l-spine lat]
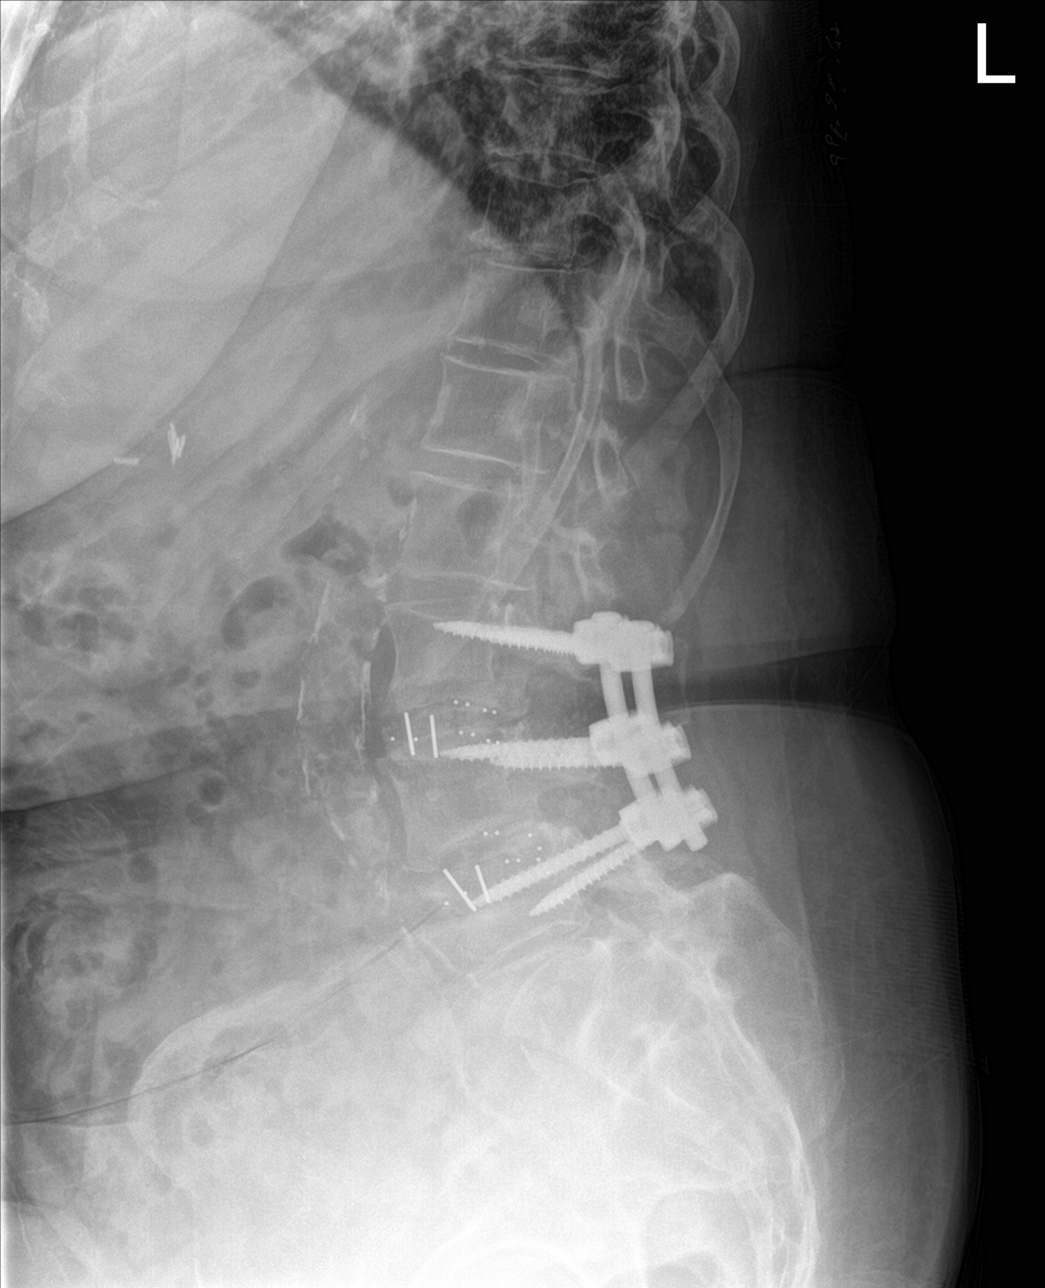

[l-spine spot]
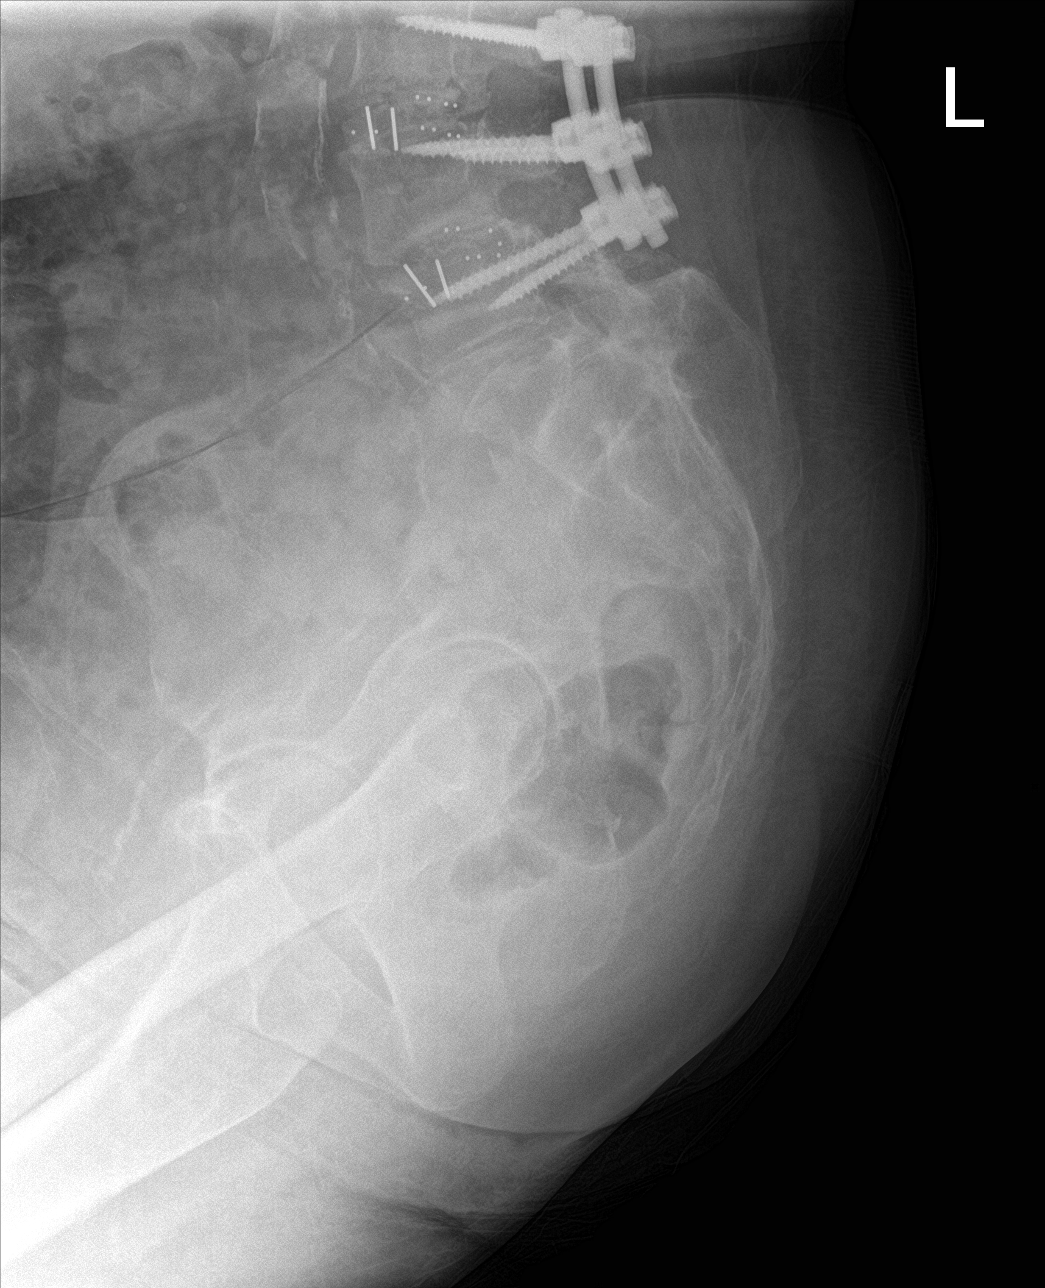

[5 of 5 positions shown; findings below may reference images not displayed]

FINDINGS: L3-L5 lumbar fusion with instrumentation has been performed. Stable
grade 1 anterolisthesis L4-5. No acute fracture. Vertebral body
height is preserved. There is preserved intervertebral disc heights
at the remaining levels within the lumbar spine. The paraspinal soft
tissues are unremarkable. Vascular calcifications are noted within
the abdominal aorta.
IMPRESSION: No acute fracture or traumatic listhesis of the lumbar spine.

L3-L5 lumbar fusion with instrumentation.

## 2021-08-02 MED ORDER — APIXABAN 5 MG PO TABS
5.0000 mg | ORAL_TABLET | Freq: Two times a day (BID) | ORAL | Status: DC
  Administered 2021-08-03 – 2021-08-05 (×6): 5 mg via ORAL
  Filled 2021-08-02 (×6): qty 1

## 2021-08-02 MED ORDER — ADULT MULTIVITAMIN W/MINERALS CH
1.0000 | ORAL_TABLET | Freq: Every day | ORAL | Status: DC
  Administered 2021-08-03 – 2021-08-05 (×3): 1 via ORAL
  Filled 2021-08-02 (×3): qty 1

## 2021-08-02 MED ORDER — SODIUM CHLORIDE 0.9 % IV BOLUS
1000.0000 mL | Freq: Once | INTRAVENOUS | Status: AC
  Administered 2021-08-02: 1000 mL via INTRAVENOUS

## 2021-08-02 MED ORDER — MELATONIN 3 MG PO TABS: 3.0000 mg | ORAL_TABLET | Freq: Every evening | ORAL | Status: DC | PRN

## 2021-08-02 MED ORDER — METOPROLOL SUCCINATE ER 100 MG PO TB24
100.0000 mg | ORAL_TABLET | Freq: Every day | ORAL | Status: DC
  Administered 2021-08-03: 100 mg via ORAL
  Filled 2021-08-02 (×3): qty 1

## 2021-08-02 MED ORDER — ROSUVASTATIN CALCIUM 5 MG PO TABS
5.0000 mg | ORAL_TABLET | Freq: Every day | ORAL | Status: DC
  Administered 2021-08-03 – 2021-08-05 (×3): 5 mg via ORAL
  Filled 2021-08-02 (×3): qty 1

## 2021-08-02 MED ORDER — POLYETHYLENE GLYCOL 3350 17 G PO PACK: 17.0000 g | PACK | Freq: Every day | ORAL | Status: DC | PRN

## 2021-08-02 MED ORDER — CLOPIDOGREL BISULFATE 75 MG PO TABS
75.0000 mg | ORAL_TABLET | Freq: Every day | ORAL | Status: DC
  Administered 2021-08-03 – 2021-08-05 (×3): 75 mg via ORAL
  Filled 2021-08-02 (×4): qty 1

## 2021-08-02 MED ORDER — INSULIN ASPART 100 UNIT/ML IJ SOLN: 0.0000 [IU] | Freq: Every day | INTRAMUSCULAR | Status: DC

## 2021-08-02 MED ORDER — PROCHLORPERAZINE EDISYLATE 10 MG/2ML IJ SOLN: 10.0000 mg | Freq: Four times a day (QID) | INTRAMUSCULAR | Status: DC | PRN

## 2021-08-02 MED ORDER — INSULIN ASPART 100 UNIT/ML IJ SOLN
0.0000 [IU] | Freq: Three times a day (TID) | INTRAMUSCULAR | Status: DC
  Administered 2021-08-03: 2 [IU] via SUBCUTANEOUS
  Administered 2021-08-03 – 2021-08-04 (×3): 1 [IU] via SUBCUTANEOUS
  Administered 2021-08-05: 2 [IU] via SUBCUTANEOUS
  Administered 2021-08-05: 1 [IU] via SUBCUTANEOUS

## 2021-08-02 MED ORDER — ENOXAPARIN SODIUM 40 MG/0.4ML IJ SOSY: 40.0000 mg | PREFILLED_SYRINGE | Freq: Every day | INTRAMUSCULAR | Status: DC

## 2021-08-02 MED ORDER — ACETAMINOPHEN 325 MG PO TABS
650.0000 mg | ORAL_TABLET | Freq: Four times a day (QID) | ORAL | Status: DC | PRN
  Administered 2021-08-03 – 2021-08-04 (×2): 650 mg via ORAL
  Filled 2021-08-02 (×2): qty 2

## 2021-08-02 MED ORDER — EZETIMIBE 10 MG PO TABS
10.0000 mg | ORAL_TABLET | Freq: Every day | ORAL | Status: DC
  Administered 2021-08-03 – 2021-08-05 (×3): 10 mg via ORAL
  Filled 2021-08-02 (×3): qty 1

## 2021-08-02 MED ORDER — ASPIRIN 81 MG PO CHEW
81.0000 mg | CHEWABLE_TABLET | Freq: Every day | ORAL | Status: DC
  Administered 2021-08-03 – 2021-08-05 (×3): 81 mg via ORAL
  Filled 2021-08-02 (×3): qty 1

## 2021-08-02 NOTE — ED Provider Notes (Signed)
?[place] ?Provider Note ? ? ?CSN: [id number] ?Arrival date & time: 08/02/21  1559 ? ?  ? ?History ? ?Chief Complaint  ?Patient presents with  ? Leg Pain  ? ? ?Debbie [name] is a 76 y.o. female. ? ?76 yo F with a chief complaints of fall.  The patient states that this morning she was taking a bath and then she became very weak and was unable to get up and get out of the bath.  She had called 911 and they were able to get her up and she was able to move around a little bit and she had something to eat and she laid down to rest.  Her husband woke her up to get her to go to a doctor's appointment today.  She got to the stairs and then suddenly became very weak.  Husband describes it as weakness count of all over.  He was able to hold her up and keep her from significantly falling initially.  She ended up falling onto her knees and had her right foot buckle underneath her body.  He was able to get her up and get her down a couple stairs and then she was able to walk a few short steps with her walker but then was unable to get into the car and became acutely very fatigued and was unable to lift her legs or arms.  The husband then got her back into the house.  And calling 911.  Patient complaining just of leg weakness.  Tells me that she has not tried to walk since she arrived here.  She does have some pain to the legs after she fell but states they only hurt when you touch them.  She denies any injury to the head or back or chest.  She then later mentions that she had a cardiac catheterization done about 48 hours ago. ? ? ?Leg Pain ? ?  ? ?Home Medications ?Prior to Admission medications   ?Medication Sig Start Date End Date Taking? Authorizing Provider  ?acetaminophen (TYLENOL) 500 MG tablet Take 2 tablets (1,000 mg total) by mouth every 8 (eight) hours as needed. 03/30/20   Christophe Louis, MD  ?amLODipine (NORVASC) 10 MG tablet Take 1 tablet (10 mg total) by mouth daily. 08/02/21  08/02/22  Pokhrel, Corrie Mckusick, MD  ?apixaban (ELIQUIS) 5 MG TABS tablet Take 1 tablet (5 mg total) by mouth 2 (two) times daily. 07/25/21 08/24/21  Lennice Sites, DO  ?aspirin 81 MG chewable tablet Chew 1 tablet (81 mg total) by mouth daily. 08/01/21 08/31/21  Pokhrel, Corrie Mckusick, MD  ?clopidogrel (PLAVIX) 75 MG tablet Take 1 tablet (75 mg total) by mouth daily with breakfast. 08/01/21 08/01/22  Pokhrel, Corrie Mckusick, MD  ?ezetimibe (ZETIA) 10 MG tablet Take 1 tablet by mouth once daily 06/15/21   Troy Sine, MD  ?furosemide (LASIX) 20 MG tablet Take 1 tablet by mouth once daily 06/15/21   Troy Sine, MD  ?gabapentin (NEURONTIN) 300 MG capsule Take 300 mg by mouth in the morning, at noon, and at bedtime. 05/06/21   [provider]  ?HYDROcodone-acetaminophen (NORCO/VICODIN) 5-325 MG tablet Take 1 tablet by mouth every 4 (four) hours as needed for moderate pain. 07/05/21 07/05/22  Meyran, Ocie Cornfield, NP  ?metFORMIN (GLUCOPHAGE) 500 MG tablet Take 1 tablet (500 mg total) by mouth 2 (two) times daily. 08/02/21   Pokhrel, Corrie Mckusick, MD  ?methocarbamol (ROBAXIN) 500 MG tablet Take 1 tablet (500 mg total) by mouth 4 (  four) times daily. ?Patient taking differently: Take 500 mg by mouth every 6 (six) hours as needed for muscle spasms. 07/05/21   Meyran, Ocie Cornfield, NP  ?metoprolol succinate (TOPROL-XL) 100 MG 24 hr tablet Take 1 tablet (100 mg total) by mouth daily. Take with or immediately following a meal. 07/25/21 08/24/21  Lennice Sites, DO  ?Multiple Vitamins-Minerals (CENTRUM SILVER ADULT 50+) TABS Take 1 tablet by mouth daily.    [provider]  ?MYRBETRIQ 50 MG TB24 tablet Take 50 mg by mouth daily. 04/12/21   [provider]  ?nitroGLYCERIN (NITROSTAT) 0.4 MG SL tablet Place 1 tablet (0.4 mg total) under the tongue every 5 (five) minutes as needed for chest pain. 04/05/21 07/29/21  Troy Sine, MD  ?olmesartan (BENICAR) 20 MG tablet Take 1 tablet (20 mg total) by mouth daily. 03/13/21   Troy Sine, MD  ?rosuvastatin (CRESTOR) 5 MG tablet Take 5 mg by mouth daily. 03/29/21   [provider]  ?   ? ?Allergies    ?Patient has no known allergies.   ? ?Review of Systems   ?Review of Systems ? ?Physical Exam ?Updated Vital Signs ?BP (!) 161/70 (BP Location: Right Arm)   Pulse 66   Temp 98.7 ?F (37.1 ?C) (Oral)   Resp 16   SpO2 96%  ?Physical Exam ?Vitals and nursing note reviewed.  ?Constitutional:   ?   General: She is not in acute distress. ?   Appearance: She is well-developed. She is not diaphoretic.  ?HENT:  ?   Head: Normocephalic and atraumatic.  ?Eyes:  ?   Pupils: Pupils are equal, round, and reactive to light.  ?Cardiovascular:  ?   Rate and Rhythm: Normal rate and regular rhythm.  ?   Heart sounds: No murmur heard. ?  No friction rub. No gallop.  ?Pulmonary:  ?   Effort: Pulmonary effort is normal.  ?   Breath sounds: No wheezing or rales.  ?Abdominal:  ?   General: There is no distension.  ?   Palpations: Abdomen is soft.  ?   Tenderness: There is no abdominal tenderness.  ?Musculoskeletal:     ?   General: Swelling and tenderness present.  ?   Cervical back: Normal range of motion and neck supple.  ?   Comments: Bruising noted to the right proximal tibia in the right lateral malleolus.  Also some bruising along the lateral aspect of the left tibia.  Some mild midline spinal tenderness about the L-spine. ? ?Intact pulse motor and sensation to the bilateral lower extremities.  She maybe has 1 beat of clonus with the right lower extremity.  ?Skin: ?   General: Skin is warm and dry.  ?Neurological:  ?   Mental Status: She is alert and oriented to person, place, and time.  ?   Comments: Multiple bruises to the lower extremities.  I do not appreciate any weakness.  She is able to hold either leg off the bed without assistance.  No appreciable weakness to the upper extremities.  No obvious focal neurologic deficit on exam.  ?Psychiatric:     ?   Behavior: Behavior normal.  ? ? ?ED  Results / Procedures / Treatments   ?Labs ?(all labs ordered are listed, but only abnormal results are displayed) ?Labs Reviewed  ?CBC WITH DIFFERENTIAL/PLATELET - Abnormal; Notable for the following components:  ?    Result Value  ? WBC 11.1 (*)   ? RBC 3.75 (*)   ?  Hemoglobin 11.2 (*)   ? HCT 35.9 (*)   ? All other components within normal limits  ?COMPREHENSIVE METABOLIC PANEL - Abnormal; Notable for the following components:  ? Glucose, Bld 122 (*)   ? Creatinine, Ser 1.22 (*)   ? GFR, Estimated 46 (*)   ? All other components within normal limits  ?TROPONIN I (HIGH SENSITIVITY) - Abnormal; Notable for the following components:  ? Troponin I (High Sensitivity) 54 (*)   ? All other components within normal limits  ?URINALYSIS, ROUTINE W REFLEX MICROSCOPIC  ?CBG MONITORING, ED  ? ? ?EKG ?EKG Interpretation ? ?Date/Time:  Thursday August 02 2021 19:39:19 EDT ?Ventricular Rate:  57 ?PR Interval:  142 ?QRS Duration: 82 ?QT Interval:  402 ?QTC Calculation: 391 ?R Axis:   66 ?Text Interpretation: Sinus bradycardia Cannot rule out Inferior infarct , age undetermined Cannot rule out Anterior infarct , age undetermined No significant change since last tracing Confirmed by Deno Etienne [id number]) on 08/02/2021 8:07:13 PM ? ?Radiology ?DG Lumbar Spine Complete ? ?Result Date: 08/02/2021 ?CLINICAL DATA:  Fall, back pain EXAM: LUMBAR SPINE - COMPLETE 4+ VIEW COMPARISON:  07/17/2021 FINDINGS: L3-L5 lumbar fusion with instrumentation has been performed. Stable grade 1 anterolisthesis L4-5. No acute fracture. Vertebral body height is preserved. There is preserved intervertebral disc heights at the remaining levels within the lumbar spine. The paraspinal soft tissues are unremarkable. Vascular calcifications are noted within the abdominal aorta. IMPRESSION: No acute fracture or traumatic listhesis of the lumbar spine. L3-L5 lumbar fusion with instrumentation. Electronically Signed   By: Fidela Salisbury M.D.   On: 08/02/2021 19:55  ? ?DG  Tibia/Fibula Left ? ?Result Date: 08/02/2021 ?CLINICAL DATA:  Left ankle pain following fall EXAM: LEFT TIBIA AND FIBULA - 2 VIEW COMPARISON:  None. FINDINGS: There is no evidence of fracture or other focal bone lesio

## 2021-08-02 NOTE — ED Provider Triage Note (Signed)
Emergency Medicine Provider Triage Evaluation Note ? ?Debbie [name] , a 76 y.o. female  was evaluated in triage.  Pt complains of right knee pain.  Patient states that EMS was called due to her right knee pain.  She endorses falling while waiting for EMS due to ongoing general weakness and hitting her right knee.  Denies hitting head, denies loss of consciousness.  Patient is on blood thinners. ? ?Review of Systems  ?Positive: Right knee pain ?Negative: Loss of consciousness ? ?Physical Exam  ?BP 128/76 (BP Location: Right Arm)   Pulse 64   Temp 98.7 ?F (37.1 ?C) (Oral)   Resp 18   SpO2 97%  ?Gen:   Awake, no distress   ?Resp:  Normal effort  ?MSK:   Moves extremities without difficulty  ?Other:   ? ?Medical Decision Making  ?Medically screening exam initiated at 4:06 PM.  Appropriate orders placed.  Debbie [name] was informed that the remainder of the evaluation will be completed by another provider, this initial triage assessment does not replace that evaluation, and the importance of remaining in the ED until their evaluation is complete. ? ? ?  ?Dorothyann Peng, PA-C ?08/02/21 1607 ? ?

## 2021-08-02 NOTE — ED Triage Notes (Signed)
Patient BIB [facility] from home with complaint of weakness and pain in the right leg, history of chronic right knee pain. Patient is alert, oriented, and in no apparent distress at this time.  ?

## 2021-08-02 NOTE — Progress Notes (Deleted)
Office Visit    Patient Name: Debbie [name] Date of Encounter: 08/02/2021  Primary Care Provider:  Sandi Mariscal, MD Primary Cardiologist:  Shelva Majestic, MD Primary Electrophysiologist: None [facility] Follow up for PCI   Patient Profile: CAD s/p DES to RCA with 3 stents (2006) HTN HFpEF PAF History of CVA DM type II Atrial tachycardia   Recent Studies: 2017 Myoview: Normal study with no ischemia 2D echo 06/2021: EF 55-60%, normal LV function, no RWMA, no LVH LHC 07/2021: Mid RCA 50% stenosis, proximal RCA 80% stenosis treated with DES x1 with 0% residual stenosis.  LVEDP is 17 mmHg  History of Present Illness    Debbie [name] is a 76 y.o. female PMH of PAF, HTN, CVA 1998, bradycardia and HFpEF.  LHC in 2006 with patent RCA, exercise Myoview with low risk study in 2013. Nuclear stress test 01/19/2016 showed low risk and no ischemia with an EF of 58% after experiencing left-sided chest pressure. She was last seen in the office by Dr. Claiborne Billings on 11/22 for 23 month follow-up and preoperative clearance.  During the visit [name] stated she was doing well and was scheduled to undergo robotic assisted hysterectomy for removal of ovarian mass. Her blood pressure was elevated and ACE was changed to ARB. TSH level was ordered and was within normal limits. Surgical clearance was granted.  [name] underwent lumbar laminectomy and fusion on 3/22 that was successful and was discharged home the next day and satisfactory condition. On 3/27 however patient was admitted to [facility], ED via EMS for right-sided chest pain that occurred during bowel movement. Her pain lasted for 1 hour and was relieved with 325 mg aspirin. EKG demonstrated sinus rhythm. D-dimer elevated however CTA negative for PE, BNP elevated at 762.9 and minimal troponin elevation. CTA demonstrated positive coronary calcification in all vessels. Echo completed with EF 55-60%, no mitral stenosis, and no RWMA. She was  treated with IV Lasix and discharged. Patient presented again to ED on 07/25/2021 with complaint of shortness of breath, chest pain, palpitations. Per EMS patient was in atrial fibrillation with RVR and treated with 10 mg IV Cardizem and converted to sinus rhythm with heart rate in the 60s on arrival. Her metoprolol was increased to 100 mg daily, ASA was stopped and Eliquis was started.  Patient presented again to the ED three days later on 4/23 with A-fib and chest pain.  Chest pain primarily occurs when in A-fib and is relieved with nitroglycerin.  LHC was performed on 4/18 that showed 80% stenosis between proximal and mid stent with 50% in-stent narrowing.  PTCA was performed and DES was placed with 0% residual stenosis.  Patient was placed on triple therapy of ASA, Plavix, and Eliquis for 1 month.  She was later discharged in satisfactory condition.   Since {Blank single:19197::"last being seen in our clinic","discharge from hospital"} the patient reports doing ***.  '@He'$ /She@ denies chest pain, palpitations, dyspnea, PND, orthopnea, nausea, vomiting, dizziness, syncope, edema, weight gain, or early satiety.   Past Medical History    Past Medical History:  Diagnosis Date   Arthritis    CAD (coronary artery disease) 11/1999   RCA stent X3, patent '06. Nuc low risk 9/13   Cataract    beginning stages   Diabetes mellitus without complication ([place])    Dysrhythmia    History of stress test 01/08/2012   Showed minimal anterior thinning not felt to be significant.   Hx  of echocardiogram 12/12/2011   EF>55%   Hypercholesteremia    Hypertension    Myocardial infarction [facility])    18 years ago    age 81   PAF (paroxysmal atrial fibrillation) ([place]) 11/2011   SSS component with some bradycardia   Stroke ([place]) 1998   Vitamin D deficiency    Past Surgical History:  Procedure Laterality Date   ABDOMINAL HYSTERECTOMY     CATARACT EXTRACTION, BILATERAL     CHOLECYSTECTOMY     COLONOSCOPY      CORONARY ANGIOGRAM  08/2004   patent stents   CORONARY ANGIOPLASTY WITH STENT PLACEMENT  11/1999   X 3   CORONARY STENT INTERVENTION N/A 07/31/2021   Procedure: CORONARY STENT INTERVENTION;  Surgeon: Troy Sine, MD;  Location: [place] CV LAB;  Service: Cardiovascular;  Laterality: N/A;   EYE SURGERY     LEFT HEART CATH AND CORONARY ANGIOGRAPHY N/A 07/31/2021   Procedure: LEFT HEART CATH AND CORONARY ANGIOGRAPHY;  Surgeon: Troy Sine, MD;  Location: [place] CV LAB;  Service: Cardiovascular;  Laterality: N/A;   MULTIPLE TOOTH EXTRACTIONS     ROBOTIC ASSISTED TOTAL HYSTERECTOMY WITH BILATERAL SALPINGO OOPHERECTOMY Bilateral 03/29/2020   Procedure: XI ROBOTIC ASSISTED TOTAL HYSTERECTOMY WITH BILATERAL SALPINGO OOPHORECTOMY;  Surgeon: Christophe Louis, MD;  Location: [place];  Service: Gynecology;  Laterality: Bilateral;  Tracie to RNFA confirmed on 02/16/20 CS    Allergies  No Known Allergies  Home Medications    Current Outpatient Medications  Medication Sig Dispense Refill   acetaminophen (TYLENOL) 500 MG tablet Take 2 tablets (1,000 mg total) by mouth every 8 (eight) hours as needed. 30 tablet 0   amLODipine (NORVASC) 10 MG tablet Take 1 tablet (10 mg total) by mouth daily. 30 tablet 2   apixaban (ELIQUIS) 5 MG TABS tablet Take 1 tablet (5 mg total) by mouth 2 (two) times daily. 60 tablet 0   aspirin 81 MG chewable tablet Chew 1 tablet (81 mg total) by mouth daily. 30 tablet 0   clopidogrel (PLAVIX) 75 MG tablet Take 1 tablet (75 mg total) by mouth daily with breakfast. 30 tablet 11   ezetimibe (ZETIA) 10 MG tablet Take 1 tablet by mouth once daily 90 tablet 3   furosemide (LASIX) 20 MG tablet Take 1 tablet by mouth once daily 90 tablet 3   gabapentin (NEURONTIN) 300 MG capsule Take 300 mg by mouth in the morning, at noon, and at bedtime.     HYDROcodone-acetaminophen (NORCO/VICODIN) 5-325 MG tablet Take 1 tablet by mouth every 4 (four) hours as needed for moderate pain. 20  tablet 0   metFORMIN (GLUCOPHAGE) 500 MG tablet Take 1 tablet (500 mg total) by mouth 2 (two) times daily.     methocarbamol (ROBAXIN) 500 MG tablet Take 1 tablet (500 mg total) by mouth 4 (four) times daily. (Patient taking differently: Take 500 mg by mouth every 6 (six) hours as needed for muscle spasms.) 45 tablet 0   metoprolol succinate (TOPROL-XL) 100 MG 24 hr tablet Take 1 tablet (100 mg total) by mouth daily. Take with or immediately following a meal. 30 tablet 0   Multiple Vitamins-Minerals (CENTRUM SILVER ADULT 50+) TABS Take 1 tablet by mouth daily.     MYRBETRIQ 50 MG TB24 tablet Take 50 mg by mouth daily.     nitroGLYCERIN (NITROSTAT) 0.4 MG SL tablet Place 1 tablet (0.4 mg total) under the tongue every 5 (five) minutes as needed for chest pain. 25 tablet 3  olmesartan (BENICAR) 20 MG tablet Take 1 tablet (20 mg total) by mouth daily. 30 tablet 11   rosuvastatin (CRESTOR) 5 MG tablet Take 5 mg by mouth daily.     No current facility-administered medications for this visit.     Review of Systems  Please see the history of present illness.    (+)*** (+)***  All other systems reviewed and are otherwise negative except as noted above.  Physical Exam    Wt Readings from Last 3 Encounters:  07/30/21 189 lb 6 oz (85.9 kg)  07/10/21 194 lb 3.6 oz (88.1 kg)  07/04/21 196 lb (88.9 kg)   [id number] were no vitals filed for this visit.,There is no height or weight on file to calculate BMI.  Constitutional:      Appearance: Healthy appearance. Not in distress.  Neck:     Vascular: JVD normal.  Pulmonary:     Effort: Pulmonary effort is normal.     Breath sounds: No wheezing. No rales.  Cardiovascular:     Normal rate. Regular rhythm. Normal S1. Normal S2.      Murmurs: There is no murmur.  Edema:    Peripheral edema absent.  Abdominal:     Palpations: Abdomen is soft. There is no hepatomegaly.  Skin:    General: Skin is warm and dry.  Neurological:     General: No focal  deficit present.     Mental Status: Alert and oriented to person, place and time.     Cranial Nerves: Cranial nerves are intact.  EKG/LABS/Other Studies Reviewed    ECG personally reviewed by me today - ***  with rate of ***- no acute changes.  Risk Assessment/Calculations:    CHA2DS2-VASc Score = 8  {Confirm score is correct.  If not, click here to update score.  REFRESH note.  :1} This indicates a 10.8% annual risk of stroke. The patient's score is based upon: CHF History: 0 HTN History: 1 Diabetes History: 1 Stroke History: 2 Vascular Disease History: 1 Age Score: 2 Gender Score: 1   Lab Results  Component Value Date   WBC 6.2 08/01/2021   HGB 10.7 (L) 08/01/2021   HCT 32.9 (L) 08/01/2021   MCV 93.2 08/01/2021   PLT 251 08/01/2021   Lab Results  Component Value Date   CREATININE 0.89 08/01/2021   BUN 5 (L) 08/01/2021   NA 139 08/01/2021   K 3.8 08/01/2021   CL 111 08/01/2021   CO2 20 (L) 08/01/2021   Lab Results  Component Value Date   ALT 22 07/29/2021   AST 21 07/29/2021   ALKPHOS 126 07/29/2021   BILITOT 1.1 07/29/2021   Lab Results  Component Value Date   CHOL 110 07/30/2021   HDL 44 07/30/2021   LDLCALC 47 07/30/2021   TRIG 96 07/30/2021   CHOLHDL 2.5 07/30/2021    Lab Results  Component Value Date   HGBA1C 7.1 (H) 07/02/2021    Assessment & Plan    1.  Coronary artery disease: -s/p DES to RCA with 3 stents (2006) underwent cath on 07/31/2021 which showed 80% prox RCA lesion between the proximal and mid stent with 50% ISR in the mid stent, patent distal third RCA stent. -GDMT consist of ASA 81 mg, Toprol 100 mg daily, Crestor 5 mg -Patient is currently on triple therapy with ASA 81 mg, Plavix 75 mg, Eliquis 5 mg twice daily for 1 month -Stable with no anginal symptoms. No indication for ischemic evaluation.   -Patient is***for  cardiac rehab 2.  Paroxysmal atrial fibrillation: -Patient's rate today is*** -She is currently on rate control with  Toprol 100 mg daily -Continue Eliquis 5 mg twice daily -Patient currently tolerating Eliquis without*** -CHA2DS2-VASc Score = 8 [CHF History: 0, HTN History: 1, Diabetes History: 1, Stroke History: 2, Vascular Disease History: 1, Age Score: 2, Gender Score: 1].  Therefore, the patient's annual risk of stroke is 10.8 %.      3.  Hypertension: -Patient's blood pressure today was*** -Continue metoprolol 100 mg daily, and amlodipine 10 mg daily  4.  Hyperlipidemia: -Last LDL cholesterol was*** -Continue rosuvastatin 5 mg daily and Zetia 10 mg daily  {The patient has an active order for outpatient cardiac rehabilitation.   Please indicate if the patient is ready to start. Do NOT delete this.  It will auto delete.  Refresh note, then sign.              Click here to document readiness and see contraindications.  :1}  Cardiac Rehabilitation Eligibility Assessment      Disposition: Follow-up with Shelva Majestic, MD or APP in *** months {Are you ordering a CV Procedure (e.g. stress test, cath, DCCV, TEE, etc)?   Press F2        :[id number]}   Medication Adjustments/Labs and Tests Ordered: Current medicines are reviewed at length with the patient today.  Concerns regarding medicines are outlined above.  Tests Ordered: No orders of the defined types were placed in this encounter.  Medication Changes: No orders of the defined types were placed in this encounter.   Signed, Mable Fill, Marissa Nestle, NP 08/02/2021, 6:55 AM [place]

## 2021-08-02 NOTE — H&P (Addendum)
History and Physical  Debbie [name] UVO:[id number] DOB: [birth year] DOA: 08/02/2021  Referring physician: Dr. Adela Lank, EDP PCP: Salli Real, MD  Outpatient Specialists: Cardiology. Patient coming from: Home via EMS.  Chief Complaint: Generalized weakness.  HPI: Debbie [name] is a 76 y.o. female with medical history significant for coronary artery disease status post PCI with stenting on 07/31/2021, currently on triple therapy aspirin Plavix and Eliquis for 1 month then Eliquis and Plavix indefinitely, hypertension, hyperlipidemia, type 2 diabetes, recently diagnosed paroxysmal A-fib on Eliquis, recent lumbar fusion 1 month ago by neurosurgery Dr. Yetta Barre, who presented to [facility] ED with complaints of generalized weakness.  Mainly involving her lower extremities bilaterally.  Associated with pain in her thighs and knees.  Endorses the pain in her thighs are chronic, they were present prior to her back surgery.  Patient was discharged from the hospital yesterday after being admitted for chest pain concerning for unstable angina.  Since this morning patient has been too weak to walk.  Fell 4 times due to weakness in her lower extremities and pain in her thighs and knees.  Also endorses upper extremity weakness as well.  No reported fevers or chills.  No chest pain.  EMS was activated and the patient was brought into the ED for further evaluation.  In the ED, work-up reveals mildly elevated troponin 54 with bradycardia 57 and T wave inversion in leads V4 V5 and V6.  Mild leukocytosis with WBC 11.1.  UA, MRI with and without contrast of lumbar spine are pending.  ED Course: Tmax 98.9.  BP 161/70, pulse 66, respiratory 16, saturation 96% on room air.  Lab studies remarkable for WBC 11.1.  Hemoglobin 11.2.  Serum glucose 122, creatinine 1.22 with baseline creatinine of 0.8.  Review of Systems: Review of systems as noted in the HPI. All other systems reviewed and are negative.   Past Medical History:  Diagnosis  Date   Arthritis    CAD (coronary artery disease) 11/1999   RCA stent X3, patent '06. Nuc low risk 9/13   Cataract    beginning stages   Diabetes mellitus without complication (HCC)    Dysrhythmia    History of stress test 01/08/2012   Showed minimal anterior thinning not felt to be significant.   Hx of echocardiogram 12/12/2011   EF>55%   Hypercholesteremia    Hypertension    Myocardial infarction [facility])    18 years ago    age 38   PAF (paroxysmal atrial fibrillation) (HCC) 11/2011   SSS component with some bradycardia   Stroke (HCC) 1998   Vitamin D deficiency    Past Surgical History:  Procedure Laterality Date   ABDOMINAL HYSTERECTOMY     CATARACT EXTRACTION, BILATERAL     CHOLECYSTECTOMY     COLONOSCOPY     CORONARY ANGIOGRAM  08/2004   patent stents   CORONARY ANGIOPLASTY WITH STENT PLACEMENT  11/1999   X 3   CORONARY STENT INTERVENTION N/A 07/31/2021   Procedure: CORONARY STENT INTERVENTION;  Surgeon: Lennette Bihari, MD;  Location: [facility] INVASIVE CV LAB;  Service: Cardiovascular;  Laterality: N/A;   EYE SURGERY     LEFT HEART CATH AND CORONARY ANGIOGRAPHY N/A 07/31/2021   Procedure: LEFT HEART CATH AND CORONARY ANGIOGRAPHY;  Surgeon: Lennette Bihari, MD;  Location: [facility] INVASIVE CV LAB;  Service: Cardiovascular;  Laterality: N/A;   MULTIPLE TOOTH EXTRACTIONS     ROBOTIC ASSISTED TOTAL HYSTERECTOMY WITH BILATERAL SALPINGO OOPHERECTOMY Bilateral 03/29/2020   Procedure: XI  ROBOTIC ASSISTED TOTAL HYSTERECTOMY WITH BILATERAL SALPINGO OOPHORECTOMY;  Surgeon: Gerald Leitz, MD;  Location: [facility] OR;  Service: Gynecology;  Laterality: Bilateral;  Tracie to RNFA confirmed on 02/16/20 CS    Social History:  reports that she has been smoking cigarettes. She has never used smokeless tobacco. She reports that she does not currently use alcohol. She reports that she does not use drugs.   No Known Allergies  Family History  Problem Relation Age of Onset   Diabetes type II Sister    Heart  disease Sister    Colon cancer Neg Hx    Colon polyps Neg Hx    Esophageal cancer Neg Hx    Rectal cancer Neg Hx    Stomach cancer Neg Hx       Prior to Admission medications   Medication Sig Start Date End Date Taking? Authorizing Provider  acetaminophen (TYLENOL) 500 MG tablet Take 2 tablets (1,000 mg total) by mouth every 8 (eight) hours as needed. 03/30/20   Gerald Leitz, MD  amLODipine (NORVASC) 10 MG tablet Take 1 tablet (10 mg total) by mouth daily. 08/02/21 08/02/22  Pokhrel, Rebekah Chesterfield, MD  apixaban (ELIQUIS) 5 MG TABS tablet Take 1 tablet (5 mg total) by mouth 2 (two) times daily. 07/25/21 08/24/21  Curatolo, Adam, DO  aspirin 81 MG chewable tablet Chew 1 tablet (81 mg total) by mouth daily. 08/01/21 08/31/21  Pokhrel, Rebekah Chesterfield, MD  clopidogrel (PLAVIX) 75 MG tablet Take 1 tablet (75 mg total) by mouth daily with breakfast. 08/01/21 08/01/22  Pokhrel, Rebekah Chesterfield, MD  ezetimibe (ZETIA) 10 MG tablet Take 1 tablet by mouth once daily 06/15/21   Lennette Bihari, MD  furosemide (LASIX) 20 MG tablet Take 1 tablet by mouth once daily 06/15/21   Lennette Bihari, MD  gabapentin (NEURONTIN) 300 MG capsule Take 300 mg by mouth in the morning, at noon, and at bedtime. 05/06/21   [provider]  HYDROcodone-acetaminophen (NORCO/VICODIN) 5-325 MG tablet Take 1 tablet by mouth every 4 (four) hours as needed for moderate pain. 07/05/21 07/05/22  Meyran, Tiana Loft, NP  metFORMIN (GLUCOPHAGE) 500 MG tablet Take 1 tablet (500 mg total) by mouth 2 (two) times daily. 08/02/21   Pokhrel, Rebekah Chesterfield, MD  methocarbamol (ROBAXIN) 500 MG tablet Take 1 tablet (500 mg total) by mouth 4 (four) times daily. Patient taking differently: Take 500 mg by mouth every 6 (six) hours as needed for muscle spasms. 07/05/21   Meyran, Tiana Loft, NP  metoprolol succinate (TOPROL-XL) 100 MG 24 hr tablet Take 1 tablet (100 mg total) by mouth daily. Take with or immediately following a meal. 07/25/21 08/24/21  Curatolo, Adam, DO  Multiple  Vitamins-Minerals (CENTRUM SILVER ADULT 50+) TABS Take 1 tablet by mouth daily.    [provider]  MYRBETRIQ 50 MG TB24 tablet Take 50 mg by mouth daily. 04/12/21   [provider]  nitroGLYCERIN (NITROSTAT) 0.4 MG SL tablet Place 1 tablet (0.4 mg total) under the tongue every 5 (five) minutes as needed for chest pain. 04/05/21 07/29/21  Lennette Bihari, MD  olmesartan (BENICAR) 20 MG tablet Take 1 tablet (20 mg total) by mouth daily. 03/13/21   Lennette Bihari, MD  rosuvastatin (CRESTOR) 5 MG tablet Take 5 mg by mouth daily. 03/29/21   [provider]    Physical Exam: BP (!) 161/70 (BP Location: Right Arm)   Pulse 66   Temp 98.7 F (37.1 C) (Oral)   Resp 16   SpO2 96%  General: 76 y.o. year-old female well developed well nourished in no acute distress.  Alert and oriented x3. Cardiovascular: Regular rate and rhythm with no rubs or gallops.  No thyromegaly or JVD noted.  No lower extremity edema. 2/4 pulses in all 4 extremities. Respiratory: Clear to auscultation with no wheezes or rales. Good inspiratory effort. Abdomen: Soft nontender nondistended with normal bowel sounds x4 quadrants. Muskuloskeletal: No cyanosis, clubbing or edema noted bilaterally Neuro: CN II-XII intact, strength, sensation, reflexes Skin: No ulcerative lesions noted or rashes Psychiatry: Judgement and insight appear normal. Mood is appropriate for condition and setting          Labs on Admission:  Basic Metabolic Panel: Recent Labs  Lab 07/29/21 0920 07/30/21 0508 07/30/21 0713 07/31/21 0456 08/01/21 0328 08/02/21 2112  NA  --  134* 139 139 139 140  K  --  3.0* 3.2* 4.0 3.8 4.0  CL  --  107 106 106 111 110  CO2  --  19* 23 22 20* 22  GLUCOSE  --  113* 132* 135* 95 122*  BUN  --  13 14 10  5* 10  CREATININE  --  0.94 1.04* 0.97 0.89 1.22*  CALCIUM  --  8.8* 9.8 9.4 9.2 10.2  MG 1.6*  --  2.4  --   --   --    Liver Function Tests: Recent Labs  Lab 07/29/21 0920  08/02/21 2112  AST 21 34  ALT 22 27  ALKPHOS 126 119  BILITOT 1.1 0.9  PROT 6.7 6.9  ALBUMIN 3.8 4.0   Recent Labs  Lab 07/29/21 0920  LIPASE 38   No results for input(s): AMMONIA in the last 168 hours. CBC: Recent Labs  Lab 07/29/21 0449 07/30/21 0508 07/31/21 0456 08/01/21 0328 08/02/21 2112  WBC 8.3 7.0 6.6 6.2 11.1*  NEUTROABS  --   --   --   --  6.9  HGB 11.7* 10.7* 10.9* 10.7* 11.2*  HCT 36.6 35.2* 33.8* 32.9* 35.9*  MCV 94.6 100.0 93.4 93.2 95.7  PLT 344 274 287 251 294   Cardiac Enzymes: Recent Labs  Lab 07/29/21 0920  CKTOTAL 56    BNP (last 3 results) Recent Labs    07/09/21 0332 07/25/21 1834  BNP 762.9* 242.1*    ProBNP (last 3 results) No results for input(s): PROBNP in the last 8760 hours.  CBG: Recent Labs  Lab 07/31/21 1741 07/31/21 2102 08/01/21 0738 08/01/21 1143 08/02/21 1946  GLUCAP 100* 111* 112* 121* 97    Radiological Exams on Admission: DG Lumbar Spine Complete  Result Date: 08/02/2021 CLINICAL DATA:  Fall, back pain EXAM: LUMBAR SPINE - COMPLETE 4+ VIEW COMPARISON:  07/17/2021 FINDINGS: L3-L5 lumbar fusion with instrumentation has been performed. Stable grade 1 anterolisthesis L4-5. No acute fracture. Vertebral body height is preserved. There is preserved intervertebral disc heights at the remaining levels within the lumbar spine. The paraspinal soft tissues are unremarkable. Vascular calcifications are noted within the abdominal aorta. IMPRESSION: No acute fracture or traumatic listhesis of the lumbar spine. L3-L5 lumbar fusion with instrumentation. Electronically Signed   By: Helyn Numbers M.D.   On: 08/02/2021 19:55   DG Tibia/Fibula Left  Result Date: 08/02/2021 CLINICAL DATA:  Left ankle pain following fall EXAM: LEFT TIBIA AND FIBULA - 2 VIEW COMPARISON:  None. FINDINGS: There is no evidence of fracture or other focal bone lesions. Soft tissues are unremarkable. IMPRESSION: Negative. Electronically Signed   By: Helyn Numbers M.D.   On: 08/02/2021  19:56   DG Ankle Complete Right  Result Date: 08/02/2021 CLINICAL DATA:  Fall, right ankle pain fall are fall pain there is EXAM: RIGHT ANKLE - COMPLETE 3+ VIEW COMPARISON:  None. FINDINGS: There is mild soft tissue swelling dorsal to the kidney a forms. There is a small ossific fracture fragment arising from the dorsal aspect of the anterior process of the talus, best seen on sagittal view which may represent a small capsular avulsive fracture. No other fracture identified. Normal overall alignment. Mild soft tissue swelling superficial to the lateral malleolus. No ankle effusion. IMPRESSION: Soft tissue swelling dorsal to the lateral malleolus and midfoot. Possible small avulsive fracture fragment from the dorsal aspect of the anterior process of the talus. Correlation for point tenderness would be helpful in determining acuity. Electronically Signed   By: Helyn Numbers M.D.   On: 08/02/2021 19:51   DG Chest Port 1 View  Result Date: 08/02/2021 CLINICAL DATA:  Fall, chest pain EXAM: PORTABLE CHEST 1 VIEW COMPARISON:  None. FINDINGS: Lung volumes are small. Interstitial opacities within the left lung base are chronic in nature in keeping with mild subpleural fibrosis better seen on CT examination of 07/29/2021. No superimposed confluent pulmonary infiltrate. No pneumothorax or pleural effusion. Cardiac size is mildly enlarged, unchanged. No acute bone abnormality. IMPRESSION: No radiographic evidence of acute cardiopulmonary disease. Stable cardiomegaly. Electronically Signed   By: Helyn Numbers M.D.   On: 08/02/2021 19:53   DG Knee Complete 4 Views Right  Result Date: 08/02/2021 CLINICAL DATA:  Right knee pain following fall. EXAM: RIGHT KNEE - COMPLETE 4+ VIEW COMPARISON:  None. FINDINGS: Soft tissue swelling about the anterior superior aspect of the tibia without associated fracture or radiopaque foreign body. No definite knee joint effusion. Joint spaces appear  preserved however a small amount of chondrocalcinosis is seen within both the medial and lateral joint spaces. IMPRESSION: 1. Soft tissue swelling about the anterior aspect the knee without associated fracture or dislocation. 2. 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 Electronically Signed   By: Simonne Come M.D.   On: 08/02/2021 16:46    EKG: I independently viewed the EKG done and my findings are as followed: Sinus bradycardia rate of 57.  T wave inversion involving V4 V5 and V6.  QTc 391.  Assessment/Plan Present on Admission: **None**  Principal Problem:   Generalized weakness  Generalized weakness, unclear etiology. Post heart cath 48 hours ago, on 07/31/2021. Troponin mildly elevated 54, trend. Closely monitor on telemetry Continue home triple therapy post PCI with stent She denies any anginal symptoms at the time of this visit.  Elevated troponin in the setting of coronary artery disease post PCI with stent on 07/31/2021 Troponin 54, trend T wave inversion V4 V5 V6 On triple therapy, resume Resume home statin Resume home beta-blocker Consider cardiology consult in the morning.  Mild leukocytosis Obtain urine analysis to rule out active infective process. Follow-up MRI lumbar spine with and without contrast ordered by EDP. Afebrile and nonseptic appearing. Monitor WBC and fever curve Repeat CBC in the morning  AKI, likely prerenal in setting of dehydration. Baseline creatinine appears to be 0.8 with GFR greater than 60 Presented with creatinine of 1.22 with GFR 46. Gentle IV fluid hydration, LR at 50 cc/h x 1 L.Marland Kitchen Avoid nephrotoxic agents, dehydration and hypotension Monitor urine output with strict I's and O's. Obtain renal panel in the morning.  Bilateral lower extremity weakness post lumbar fusion 1 month ago by neurosurgery Dr.  Jones. Denies fevers or chills. Nonseptic appearing. MRI lumbar spine with and without contrast ordered and pending. PT  OT assessment in the morning. Fall precautions.  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 Strict I's and O's and daily weight  Essential hypertension Resume home oral antihypertensives, Toprol-XL Closely monitor vital signs  Type 2 diabetes with hyperglycemia Last hemoglobin A1c 7.1 in March 2023 Start insulin sliding scale.  Hyperlipidemia Resume home Zetia and Crestor  Physical debility with fall PT OT to assess Fall precautions.      DVT prophylaxis: Eliquis  Code Status: Full code  Family Communication: None at bedside  Disposition Plan: Admitted to telemetry cardiac  Consults called: Cardiology consulted  Admission status: Observation status   Status is: Observation    Darlin Drop MD [facility] Pager [phone]  If 7PM-7AM, please contact night-coverage [email or web address] Password [facility]  08/02/2021, 11:07 PM

## 2021-08-03 ENCOUNTER — Ambulatory Visit: Payer: Medicare PPO | Admitting: Physician Assistant

## 2021-08-03 ENCOUNTER — Observation Stay (HOSPITAL_COMMUNITY): Payer: Medicare Other

## 2021-08-03 DIAGNOSIS — Z6831 Body mass index (BMI) 31.0-31.9, adult: Secondary | ICD-10-CM | POA: Diagnosis not present

## 2021-08-03 DIAGNOSIS — I2510 Atherosclerotic heart disease of native coronary artery without angina pectoris: Secondary | ICD-10-CM | POA: Diagnosis not present

## 2021-08-03 DIAGNOSIS — W19XXXA Unspecified fall, initial encounter: Secondary | ICD-10-CM | POA: Diagnosis not present

## 2021-08-03 DIAGNOSIS — G629 Polyneuropathy, unspecified: Secondary | ICD-10-CM

## 2021-08-03 DIAGNOSIS — Z955 Presence of coronary angioplasty implant and graft: Secondary | ICD-10-CM | POA: Diagnosis not present

## 2021-08-03 DIAGNOSIS — I10 Essential (primary) hypertension: Secondary | ICD-10-CM

## 2021-08-03 DIAGNOSIS — E860 Dehydration: Secondary | ICD-10-CM | POA: Diagnosis not present

## 2021-08-03 DIAGNOSIS — E669 Obesity, unspecified: Secondary | ICD-10-CM | POA: Diagnosis not present

## 2021-08-03 DIAGNOSIS — Z8673 Personal history of transient ischemic attack (TIA), and cerebral infarction without residual deficits: Secondary | ICD-10-CM | POA: Diagnosis not present

## 2021-08-03 DIAGNOSIS — I130 Hypertensive heart and chronic kidney disease with heart failure and stage 1 through stage 4 chronic kidney disease, or unspecified chronic kidney disease: Secondary | ICD-10-CM | POA: Diagnosis not present

## 2021-08-03 DIAGNOSIS — R269 Unspecified abnormalities of gait and mobility: Secondary | ICD-10-CM | POA: Diagnosis not present

## 2021-08-03 DIAGNOSIS — N179 Acute kidney failure, unspecified: Secondary | ICD-10-CM | POA: Diagnosis not present

## 2021-08-03 DIAGNOSIS — Z7901 Long term (current) use of anticoagulants: Secondary | ICD-10-CM | POA: Diagnosis not present

## 2021-08-03 DIAGNOSIS — E7800 Pure hypercholesterolemia, unspecified: Secondary | ICD-10-CM | POA: Diagnosis not present

## 2021-08-03 DIAGNOSIS — D631 Anemia in chronic kidney disease: Secondary | ICD-10-CM | POA: Diagnosis not present

## 2021-08-03 DIAGNOSIS — I5032 Chronic diastolic (congestive) heart failure: Secondary | ICD-10-CM | POA: Diagnosis not present

## 2021-08-03 DIAGNOSIS — M48062 Spinal stenosis, lumbar region with neurogenic claudication: Secondary | ICD-10-CM | POA: Diagnosis not present

## 2021-08-03 DIAGNOSIS — I25110 Atherosclerotic heart disease of native coronary artery with unstable angina pectoris: Secondary | ICD-10-CM | POA: Diagnosis not present

## 2021-08-03 DIAGNOSIS — N1831 Chronic kidney disease, stage 3a: Secondary | ICD-10-CM | POA: Insufficient documentation

## 2021-08-03 DIAGNOSIS — I480 Paroxysmal atrial fibrillation: Secondary | ICD-10-CM | POA: Diagnosis not present

## 2021-08-03 DIAGNOSIS — M5116 Intervertebral disc disorders with radiculopathy, lumbar region: Secondary | ICD-10-CM | POA: Diagnosis not present

## 2021-08-03 DIAGNOSIS — Z9842 Cataract extraction status, left eye: Secondary | ICD-10-CM | POA: Diagnosis not present

## 2021-08-03 DIAGNOSIS — T84296A Other mechanical complication of internal fixation device of vertebrae, initial encounter: Secondary | ICD-10-CM | POA: Diagnosis not present

## 2021-08-03 DIAGNOSIS — I214 Non-ST elevation (NSTEMI) myocardial infarction: Secondary | ICD-10-CM | POA: Diagnosis not present

## 2021-08-03 DIAGNOSIS — R29898 Other symptoms and signs involving the musculoskeletal system: Secondary | ICD-10-CM | POA: Diagnosis present

## 2021-08-03 DIAGNOSIS — Y792 Prosthetic and other implants, materials and accessory orthopedic devices associated with adverse incidents: Secondary | ICD-10-CM | POA: Diagnosis not present

## 2021-08-03 DIAGNOSIS — R531 Weakness: Secondary | ICD-10-CM | POA: Diagnosis present

## 2021-08-03 DIAGNOSIS — I252 Old myocardial infarction: Secondary | ICD-10-CM | POA: Diagnosis not present

## 2021-08-03 DIAGNOSIS — Z9049 Acquired absence of other specified parts of digestive tract: Secondary | ICD-10-CM | POA: Diagnosis not present

## 2021-08-03 DIAGNOSIS — E1122 Type 2 diabetes mellitus with diabetic chronic kidney disease: Secondary | ICD-10-CM | POA: Diagnosis not present

## 2021-08-03 DIAGNOSIS — I5A Non-ischemic myocardial injury (non-traumatic): Secondary | ICD-10-CM

## 2021-08-03 DIAGNOSIS — Z90710 Acquired absence of both cervix and uterus: Secondary | ICD-10-CM | POA: Diagnosis not present

## 2021-08-03 HISTORY — DX: Chronic kidney disease, stage 3a: N18.31

## 2021-08-03 LAB — CBC WITH DIFFERENTIAL/PLATELET
Abs Immature Granulocytes: 0.03 10*3/uL (ref 0.00–0.07)
Basophils Absolute: 0 10*3/uL (ref 0.0–0.1)
Basophils Relative: 0 %
Eosinophils Absolute: 0.2 10*3/uL (ref 0.0–0.5)
Eosinophils Relative: 2 %
HCT: 31.9 % — ABNORMAL LOW (ref 36.0–46.0)
Hemoglobin: 10.2 g/dL — ABNORMAL LOW (ref 12.0–15.0)
Immature Granulocytes: 0 %
Lymphocytes Relative: 30 %
Lymphs Abs: 2.1 10*3/uL (ref 0.7–4.0)
MCH: 30.6 pg (ref 26.0–34.0)
MCHC: 32 g/dL (ref 30.0–36.0)
MCV: 95.8 fL (ref 80.0–100.0)
Monocytes Absolute: 0.8 10*3/uL (ref 0.1–1.0)
Monocytes Relative: 10 %
Neutro Abs: 4.1 10*3/uL (ref 1.7–7.7)
Neutrophils Relative %: 58 %
Platelets: 251 10*3/uL (ref 150–400)
RBC: 3.33 MIL/uL — ABNORMAL LOW (ref 3.87–5.11)
RDW: 15.1 % (ref 11.5–15.5)
WBC: 7.2 10*3/uL (ref 4.0–10.5)
nRBC: 0 % (ref 0.0–0.2)

## 2021-08-03 LAB — URINALYSIS, ROUTINE W REFLEX MICROSCOPIC
Bacteria, UA: NONE SEEN
Bilirubin Urine: NEGATIVE
Glucose, UA: NEGATIVE mg/dL
Hgb urine dipstick: NEGATIVE
Ketones, ur: 20 mg/dL — AB
Leukocytes,Ua: NEGATIVE
Nitrite: NEGATIVE
Protein, ur: NEGATIVE mg/dL
Specific Gravity, Urine: 1.016 (ref 1.005–1.030)
pH: 7 (ref 5.0–8.0)

## 2021-08-03 LAB — BASIC METABOLIC PANEL
Anion gap: 7 (ref 5–15)
BUN: 6 mg/dL — ABNORMAL LOW (ref 8–23)
CO2: 23 mmol/L (ref 22–32)
Calcium: 9.5 mg/dL (ref 8.9–10.3)
Chloride: 112 mmol/L — ABNORMAL HIGH (ref 98–111)
Creatinine, Ser: 0.88 mg/dL (ref 0.44–1.00)
GFR, Estimated: >60 mL/min (ref >60)
Glucose, Bld: 112 mg/dL — ABNORMAL HIGH (ref 70–99)
Potassium: 3.5 mmol/L (ref 3.5–5.1)
Sodium: 142 mmol/L (ref 135–145)

## 2021-08-03 LAB — PHOSPHORUS: Phosphorus: 2.4 mg/dL — ABNORMAL LOW (ref 2.5–4.6)

## 2021-08-03 LAB — CK: Total CK: 93 U/L (ref 38–234)

## 2021-08-03 LAB — TROPONIN I (HIGH SENSITIVITY)
Troponin I (High Sensitivity): 51 ng/L — ABNORMAL HIGH (ref <18)
Troponin I (High Sensitivity): 47 ng/L — ABNORMAL HIGH (ref <18)

## 2021-08-03 LAB — GLUCOSE, CAPILLARY
Glucose-Capillary: 153 mg/dL — ABNORMAL HIGH (ref 70–99)
Glucose-Capillary: 127 mg/dL — ABNORMAL HIGH (ref 70–99)
Glucose-Capillary: 109 mg/dL — ABNORMAL HIGH (ref 70–99)
Glucose-Capillary: 137 mg/dL — ABNORMAL HIGH (ref 70–99)

## 2021-08-03 LAB — MAGNESIUM: Magnesium: 1.7 mg/dL (ref 1.7–2.4)

## 2021-08-03 LAB — CBG MONITORING, ED: Glucose-Capillary: 103 mg/dL — ABNORMAL HIGH (ref 70–99)

## 2021-08-03 IMAGING — MR MR LUMBAR SPINE WO/W CM
4 of 8 series · 22 of 48 positions shown · IV contrast (Gadavist)
Comparison: [DATE]

CLINICAL DATA: Weakness.  Recent lumbar fusion.

EXAM:
MRI LUMBAR SPINE WITHOUT AND WITH CONTRAST
TECHNIQUE: Multiplanar and multiecho pulse sequences of the lumbar spine were
obtained without and with intravenous contrast.
CONTRAST:  8mL GADAVIST GADOBUTROL 1 MMOL/ML IV SOLN

[Series 5: T2 · sagittal · 4.0mm · 0.80mm/px · 3 of 18 slices shown (1 of 2)]
[im 1/18]
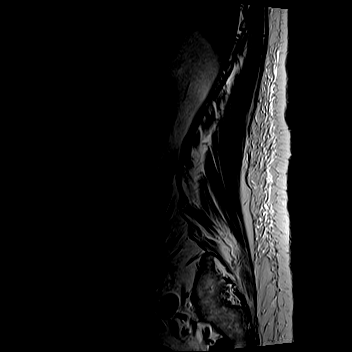
[im 9/18]
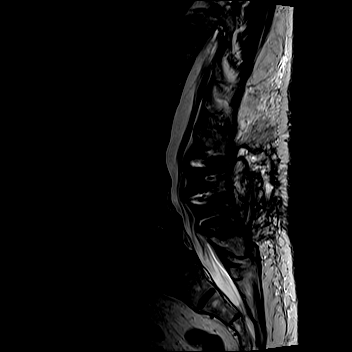
[im 18/18]
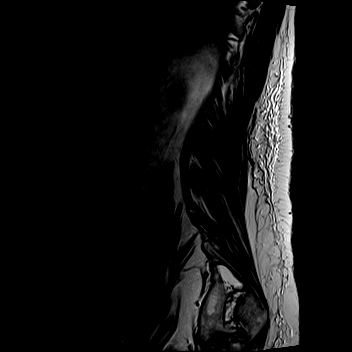

[Series 7: T1 · sagittal · 4.0mm · 0.88mm/px · 4 of 18 slices shown (1 of 2)]
[im 1/18]
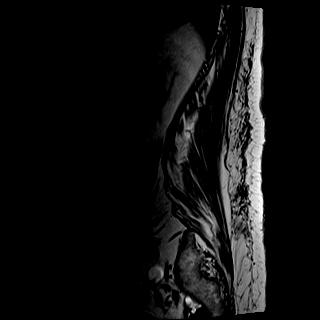
[im 6/18]
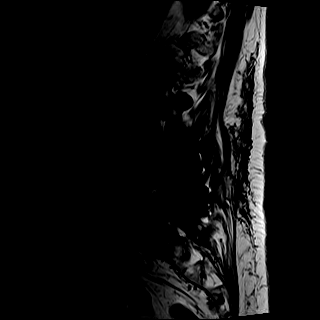
[im 12/18]
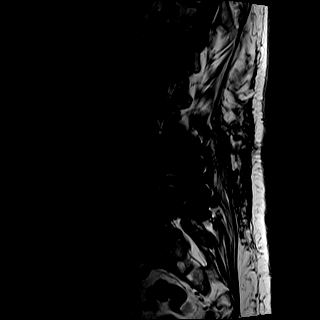
[im 18/18]
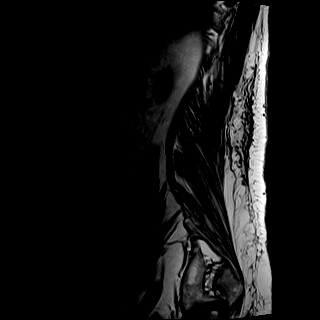

[Series 8: T2 · axial · 4.0mm · 0.57mm/px · z∈[-73,+183]mm · 8 of 49 slices shown (2 of 2)]
[im 1/49]
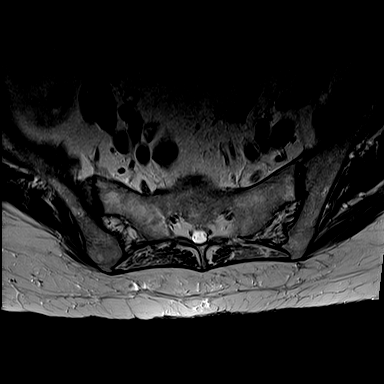
[im 6/49]
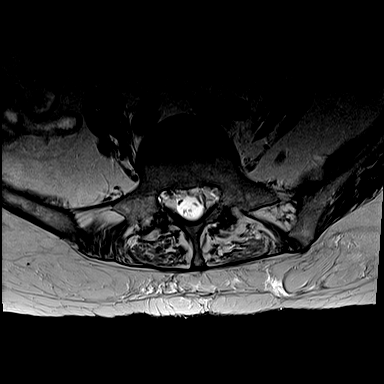
[im 17/49]
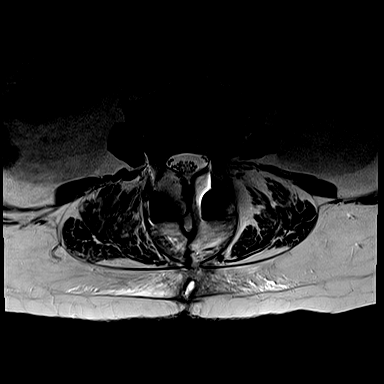
[im 22/49]
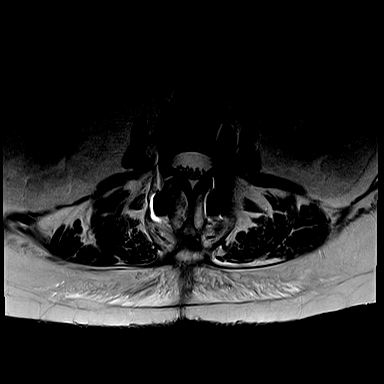
[im 27/49]
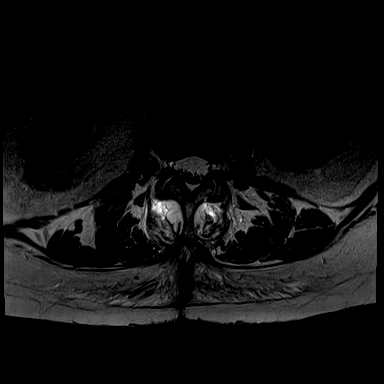
[im 33/49]
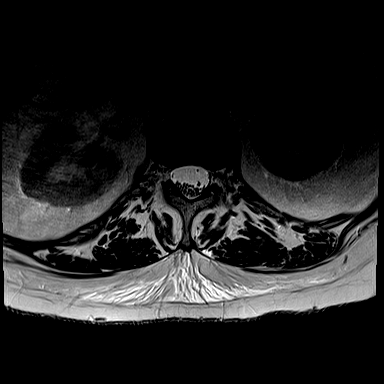
[im 43/49]
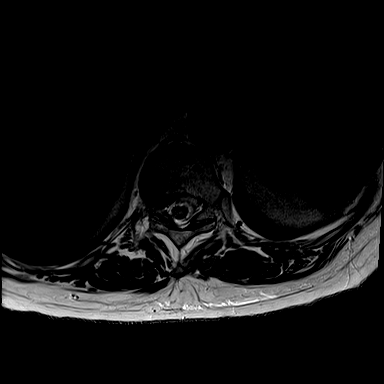
[im 49/49]
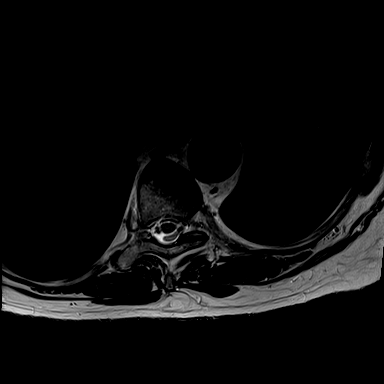

[Series 9: T1 · axial · 4.0mm · 0.34mm/px · z∈[-73,+153]mm · 7 of 49 slices shown (2 of 2)]
[im 1/49]
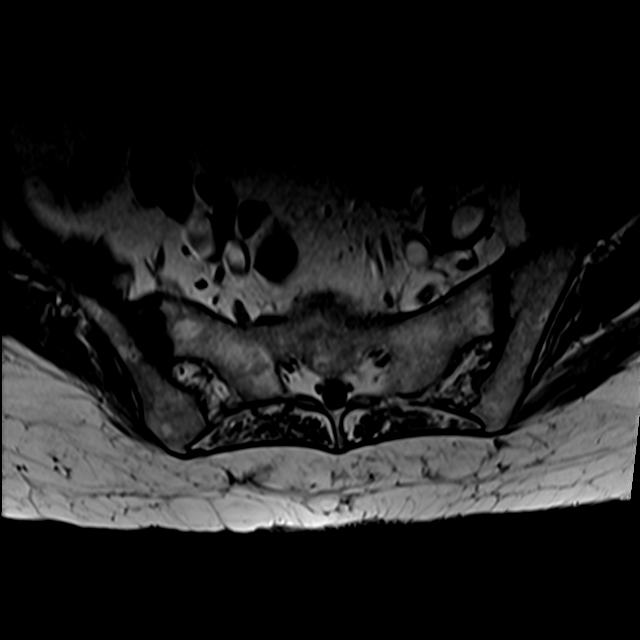
[im 6/49]
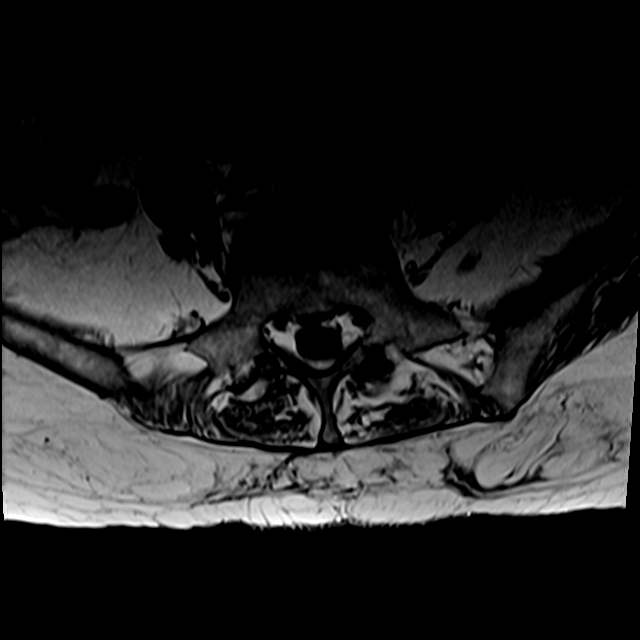
[im 17/49]
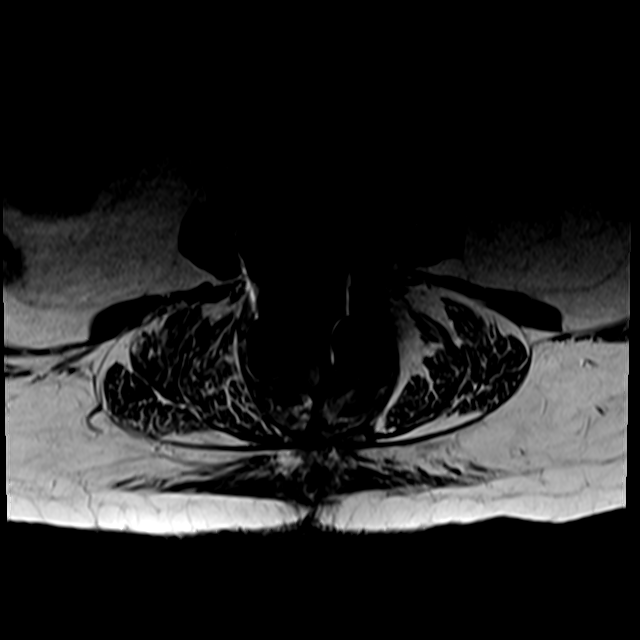
[im 22/49]
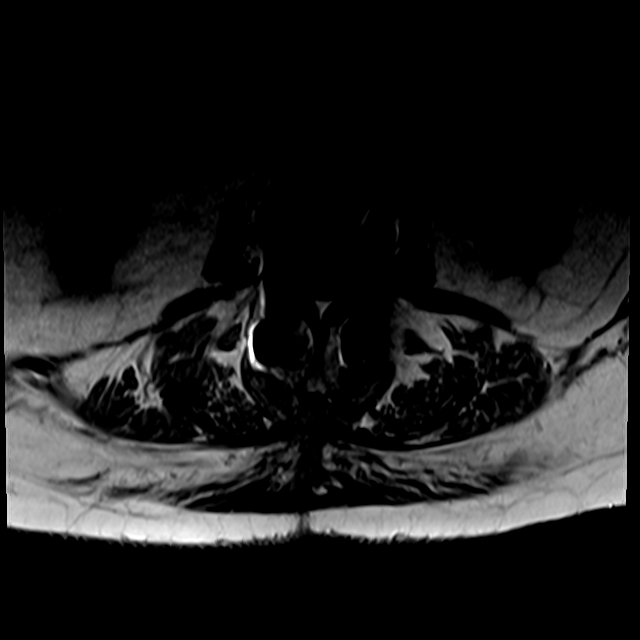
[im 27/49]
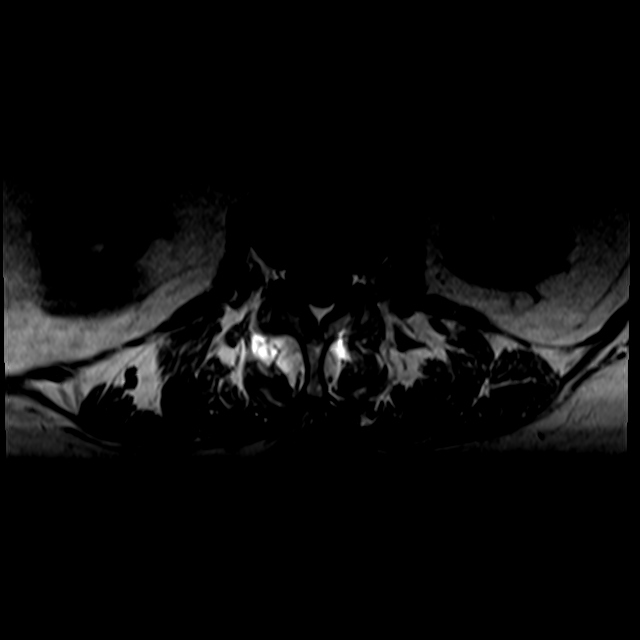
[im 33/49]
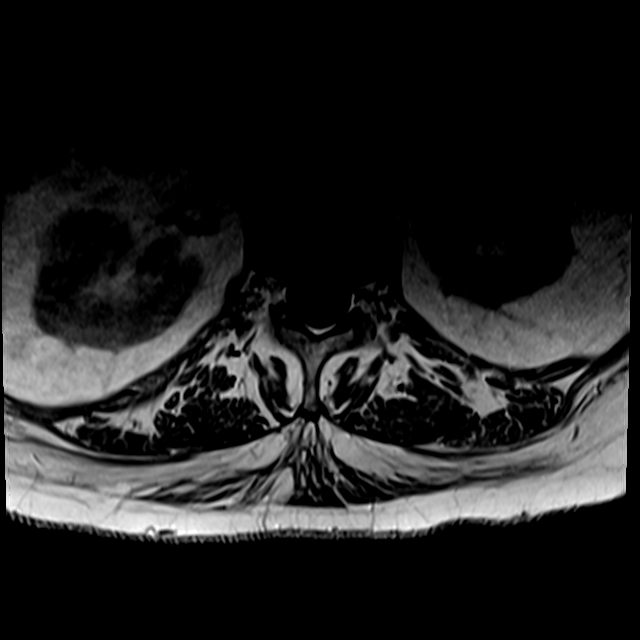
[im 43/49]
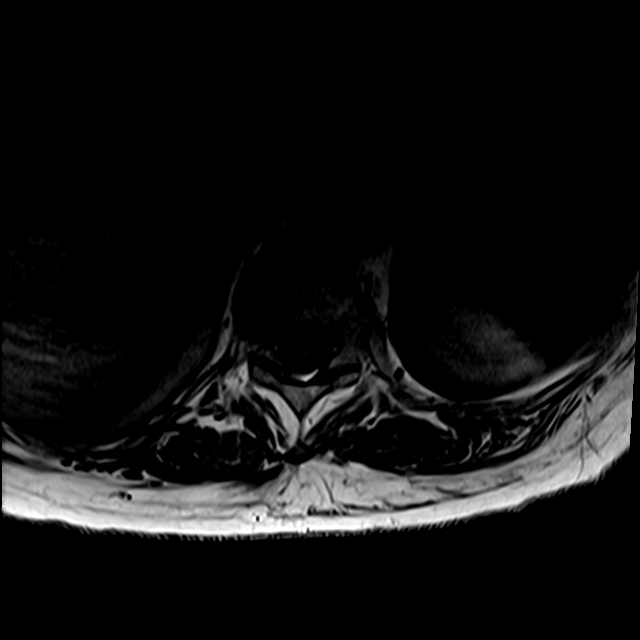

[22 of 48 positions shown; findings below may reference images not displayed]

FINDINGS: Segmentation:  Standard

Alignment:  Grade 1 anterolisthesis at L4-5

Vertebrae: L3-5 PLIF. No acute abnormality. There is disc spacer
subsidence at L4-5 with depression of the L5 superior endplate.

Conus medullaris and cauda equina: Conus extends to the L1 level.
Conus and cauda equina appear normal.

Paraspinal and other soft tissues: Negative.

Disc levels:

L1-L2: Normal disc space and facet joints. No spinal canal stenosis.
No neural foraminal stenosis.

L2-L3: Normal disc space and facet joints. No spinal canal stenosis.
No neural foraminal stenosis.

L3-L4: PLIF. No spinal canal stenosis. Improved patency of the
neural foramina with mild residual bilateral neural foraminal
stenosis.

L4-L5: PLIF. No spinal canal stenosis. Slightly worsened mild
bilateral neural foraminal stenosis.

L5-S1: New small right subarticular disc protrusion with annular
fissure in close proximity to the exiting right L5 nerve root. No
spinal canal stenosis. No neural foraminal stenosis.

Visualized sacrum: Normal.
IMPRESSION: 1. New L5-S1 small right subarticular disc protrusion with annular
fissure in close proximity to the exiting right L5 nerve root.
Correlate for right L5 radiculopathy.
2. L3-5 PLIF with improved patency of the L3-4 neural foramina and
no spinal canal stenosis.
3. Disc spacer subsidence at L4-5 with depression of the L5 superior
endplate. Slightly worsened mild bilateral neural foraminal stenosis
at L4-5.

## 2021-08-03 IMAGING — CT CT L SPINE W/O CM
3 series · 11 of 33 positions shown, 13 images · non-contrast
Comparison: MRI lumbar spine [DATE], CT [DATE]

CLINICAL DATA: Lumbar radiculopathy, symptoms persist with > 6 wks
treatment



[Series 3: l-spine 2.0 st · axial · 0.26mm/px · z∈[+991,+1153]mm · 3 of 122 slices shown, 4 images]
[im 28/122  soft-tissue]
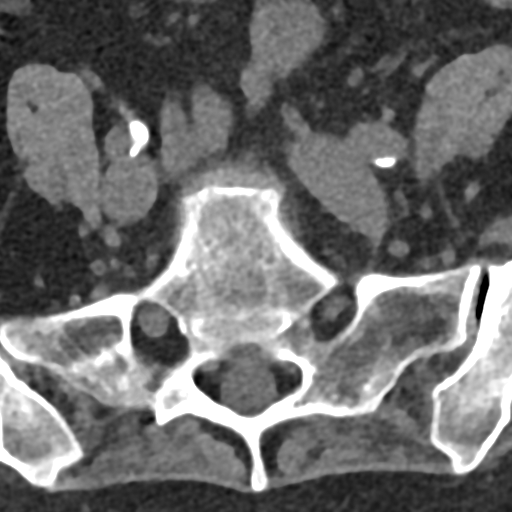
[im 28/122  bone]
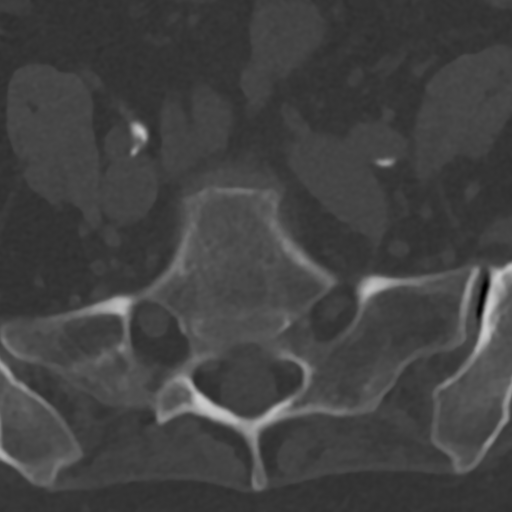
[im 66/122  bone]
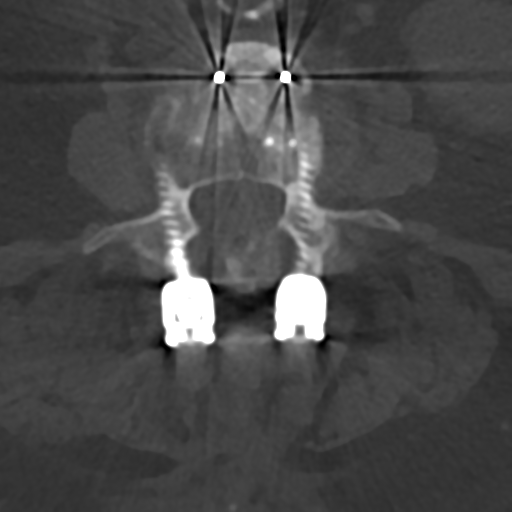
[im 103/122  bone]
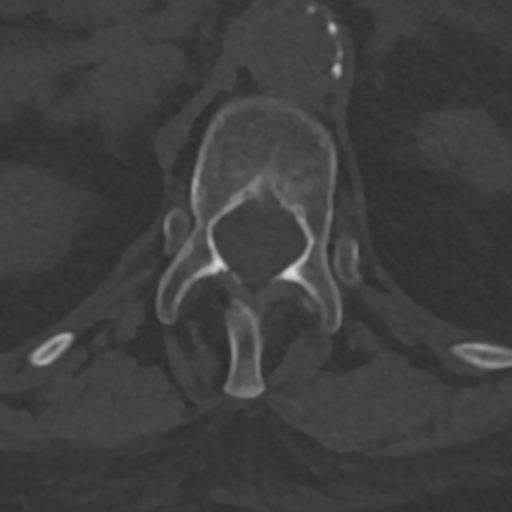

[Series 7: l-spine 2.0 cor · coronal · 0.28mm/px · 3 of 69 slices shown]
[im 14/69  bone]
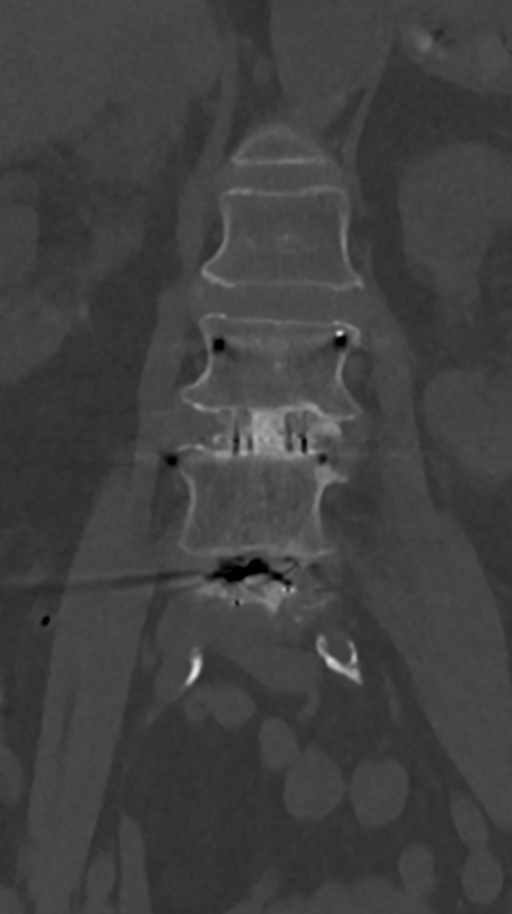
[im 28/69  bone]
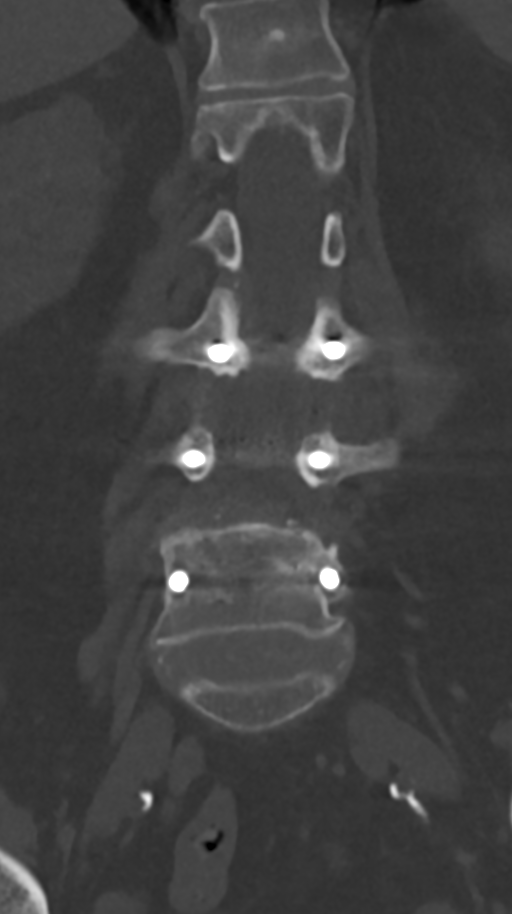
[im 41/69  bone]
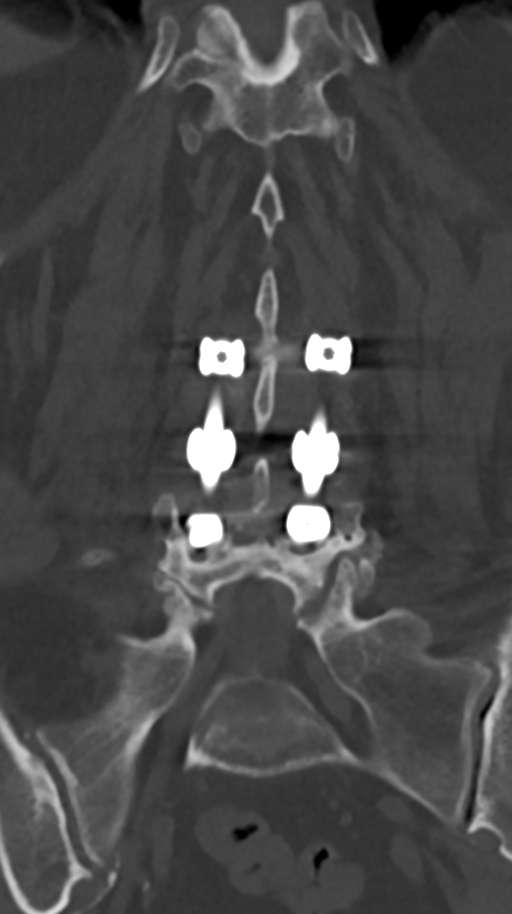

[Series 8: l-spine 2.0 sag · sagittal · 0.31mm/px · 5 of 59 slices shown, 6 images]
[im 20/59  bone]
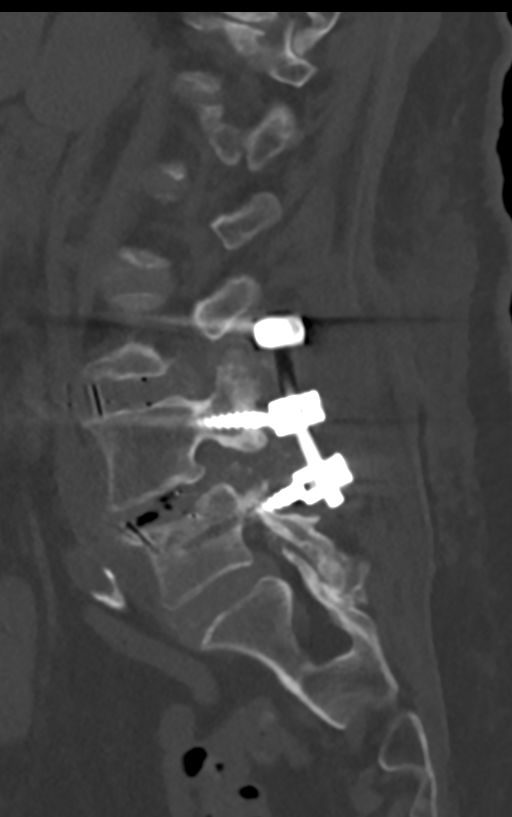
[im 25/59  bone]
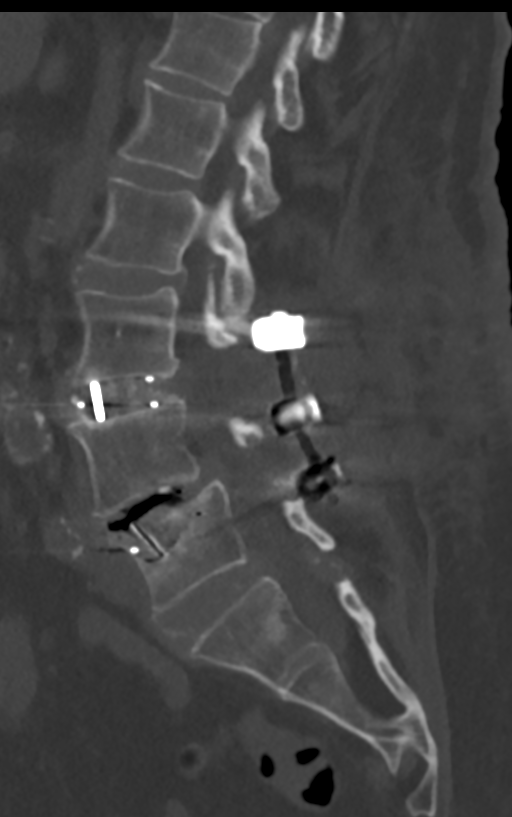
[im 30/59  soft-tissue]
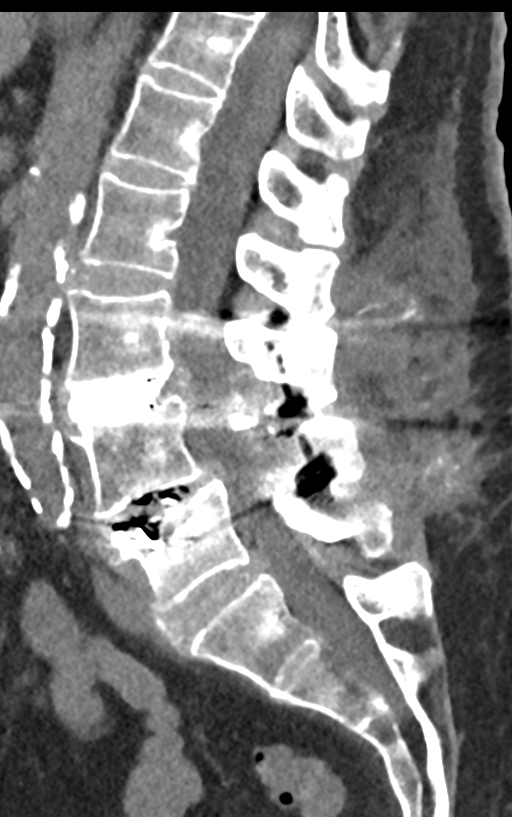
[im 30/59  bone]
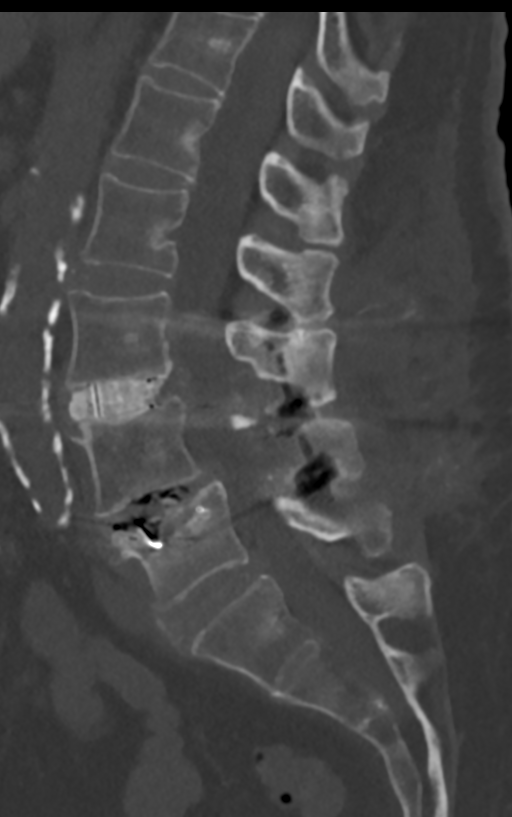
[im 34/59  bone]
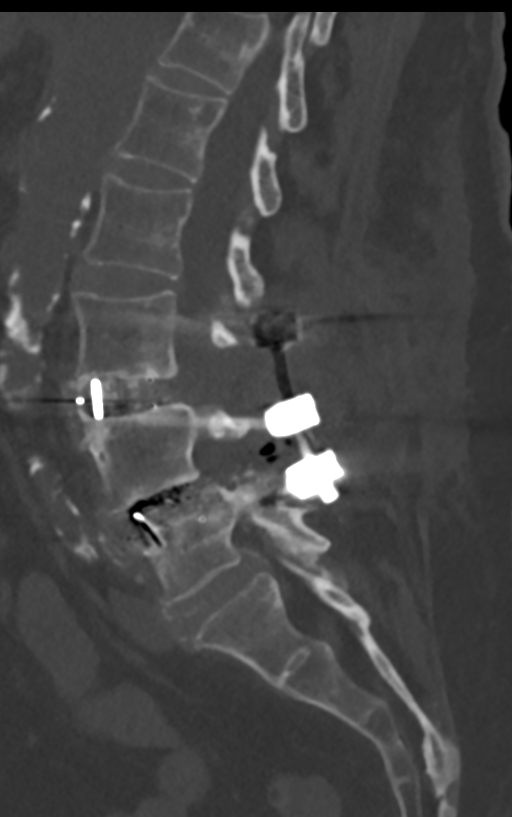
[im 39/59  bone]
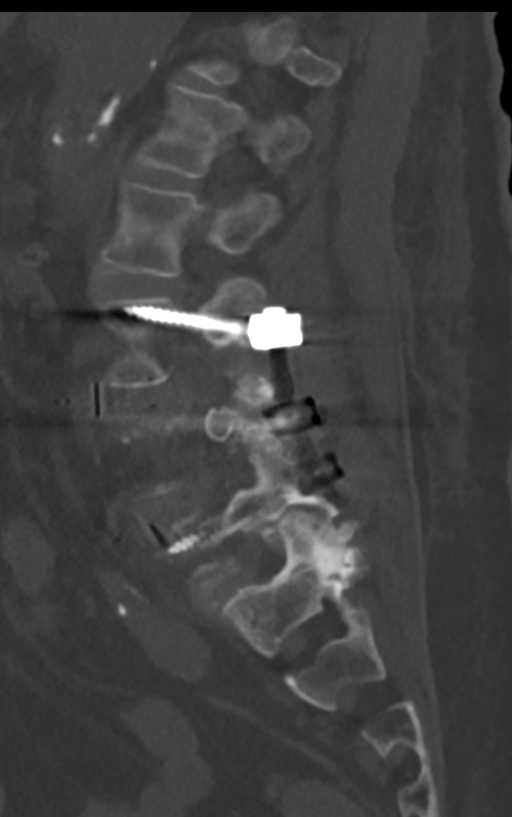

[11 of 33 positions shown; findings below may reference images not displayed]

FINDINGS: Segmentation: Numbering consistent with recent MRI. The lowest
well-formed disc space is designated as L5-S1.

Alignment: Mild residual anterolisthesis at L4-L5.

Vertebrae: Prior posterior decompression with posterior and
interbody fusion at L3-L4 and L4-L5. No solid arthrodesis at this
time. Again seen is interbody cage subsidence into the L5 vertebral
body, increased since the prior CT by approximately 4 mm, and with
increased surrounding gas. Intact pedicle screws and posterior
fusion rods.

Paraspinal and other soft tissues: Aortoiliac atherosclerosis.

Disc levels: Limited assessment due to streak artifact and
postoperative soft tissue changes. Disc levels are best assessed on
separately dictated same-day MRI. Mild bilateral neural foraminal
stenosis at L3-L4 and L4-L5. Patent spinal canal.
IMPRESSION: Postsurgical changes of PLIF at L3-L4 and L4-L5.

Increased subsidence of the L4-L5 intervertebral disc spacer
comparison to prior CT on [DATE], with depression of the L5
superior endplate.

Bilateral neural foraminal stenosis at L3-L4 and L4-L5 and
right-sided disc protrusion at L5-S1, best assessed on separately
dictated MRI.

## 2021-08-03 MED ORDER — AMLODIPINE BESYLATE 10 MG PO TABS
10.0000 mg | ORAL_TABLET | Freq: Every day | ORAL | Status: DC
  Administered 2021-08-03 – 2021-08-05 (×3): 10 mg via ORAL
  Filled 2021-08-03 (×3): qty 1

## 2021-08-03 MED ORDER — IRBESARTAN 150 MG PO TABS
150.0000 mg | ORAL_TABLET | Freq: Every day | ORAL | Status: DC
  Administered 2021-08-03 – 2021-08-05 (×3): 150 mg via ORAL
  Filled 2021-08-03 (×3): qty 1

## 2021-08-03 MED ORDER — GABAPENTIN 400 MG PO CAPS
400.0000 mg | ORAL_CAPSULE | Freq: Three times a day (TID) | ORAL | Status: DC
  Administered 2021-08-03: 400 mg via ORAL
  Filled 2021-08-03: qty 1

## 2021-08-03 MED ORDER — LORAZEPAM 2 MG/ML IJ SOLN
0.5000 mg | Freq: Once | INTRAMUSCULAR | Status: AC
  Administered 2021-08-03: 0.5 mg via INTRAVENOUS
  Filled 2021-08-03: qty 1

## 2021-08-03 MED ORDER — NITROGLYCERIN 0.4 MG SL SUBL: 0.4000 mg | SUBLINGUAL_TABLET | SUBLINGUAL | Status: DC | PRN

## 2021-08-03 MED ORDER — K PHOS MONO-SOD PHOS DI & MONO 155-852-130 MG PO TABS
500.0000 mg | ORAL_TABLET | Freq: Three times a day (TID) | ORAL | Status: AC
  Administered 2021-08-03 (×4): 500 mg via ORAL
  Filled 2021-08-03 (×4): qty 2

## 2021-08-03 MED ORDER — MIRABEGRON ER 50 MG PO TB24
50.0000 mg | ORAL_TABLET | Freq: Every day | ORAL | Status: DC
  Administered 2021-08-03 – 2021-08-05 (×3): 50 mg via ORAL
  Filled 2021-08-03 (×3): qty 1

## 2021-08-03 MED ORDER — LACTATED RINGERS IV SOLN: INTRAVENOUS | Status: DC

## 2021-08-03 MED ORDER — PREGABALIN 25 MG PO CAPS
25.0000 mg | ORAL_CAPSULE | Freq: Two times a day (BID) | ORAL | Status: DC
  Administered 2021-08-03 – 2021-08-05 (×5): 25 mg via ORAL
  Filled 2021-08-03 (×5): qty 1

## 2021-08-03 MED ORDER — FUROSEMIDE 20 MG PO TABS
20.0000 mg | ORAL_TABLET | Freq: Every day | ORAL | Status: DC
  Administered 2021-08-03 – 2021-08-05 (×3): 20 mg via ORAL
  Filled 2021-08-03 (×3): qty 1

## 2021-08-03 MED ORDER — GABAPENTIN 300 MG PO CAPS
300.0000 mg | ORAL_CAPSULE | Freq: Three times a day (TID) | ORAL | Status: DC
Start: 2021-08-03 — End: 2021-08-03

## 2021-08-03 MED ORDER — GADOBUTROL 1 MMOL/ML IV SOLN
8.0000 mL | Freq: Once | INTRAVENOUS | Status: AC | PRN
  Administered 2021-08-03: 8 mL via INTRAVENOUS

## 2021-08-03 NOTE — Progress Notes (Addendum)
?Progress Note ? ? ?Patient: Debbie [name] AYT:[id number] DOB: [birth year] DOA: 08/02/2021     0 ?DOS: the patient was seen and examined on 08/03/2021 at 9:48AM ?  ? ? ? ?Brief hospital course: ?Debbie [name] is a 76 y.o. F with spinal stenosis s/p recent lumbar fusion Mar 2023, subsequent CHF flare Mar 2023 then readmitted second time Apr 2023 for NSTEMI with stent placed to RCA 4/18 (3 days ago), also DM, pAF on Eliquis, HTN, and remote hx CVA who presented with acute weakness. ? ?Discharged from the hospital 1 day PTA.  At home the night of discharge, was in the bathtub and had onset of weakness.  EMS called to get her out of the bathtub, but by morning was no better, so husband tried to get her to the car, couldn't and called EMS again. ? ?In the ER, afebrile, hypertensive, WBC 11K, Cr up to 1.2 from baseline 0.8, UA clean. MRI spine obtained.  ECG with new T wave inversions. ? ?Admitted on fluids.  Cardiology and Neurosurgery consulted. ? ? ? ? 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  This was present prior to surgery, patient feels, but significantly worse since, and now progressing.  No improvement with Voltaren or gabapentin.  No worsened with movement, no clear provoking factors. ? ?Evaluated by Neurosurgery this morning, who performed CT lumbar spine showing progressive subsidence (disk height loss despite fusion) and suspect her burning leg pain may be consistent with radiculitis from this subsidence.   ? ?Unfortunately, surgery to correct this is complicated by recent stent. ? ?- PT eval ?- LSO brace ?- SNF ?- Switch gabapentin to Lyrica given no improvement with gabapentin ? ? ? ? ? ?Coronary artery disease involving native coronary artery of native heart ?Recent DES to proximal RCA on 4/18.  Will probably  need minimum 6 months prior to holding clopidogrel, maybe longer. ?- Consult Cardiology regarding perioperative Plavix, Eliquis and timing of stopping Eliquis ? ?-Continue aspirin, Zetia, clopidogrel ?-Continue metoprolol, Crestor ? ?AKI (acute kidney injury) ([place]) ?Cr up to 1.2 on admission, improved back to baseline 0.8 with fluids ? ?Obesity (BMI 30-39.9) ?BMI 31 ? ?Hypophosphatemia ?- Supplement Phos ? ?Myocardial injury ?Troponin minimally elevated.  No chest pain.  At this time I have low suspicion for ischemia or CHF. ? ?Normocytic anemia ?Hgb stable relative to baseline ? ?Type 2 diabetes mellitus without complication, without long-term current use of insulin ([place]) ?Glucose normal ?-Continue SS corrections ?-Hold metformin ? ?Hyperlipidemia ?-Continue Zetia, Crestor ? ?Essential hypertension ?BP elevated ?-Continue amlodipine, furosemide, metoprolol ?- Resume olmesartan ? ?PAF (paroxysmal atrial fibrillation) ([place]) ?-Continue Eliquis, metoprolol ? ? ? ? ? ? ? ? ? ?Subjective: Patient continues to have intermittent pain in the legs.  No chest pain, dyspnea, swelling orthopnea, palpitations.  Unable to stand. ? ? ? ? ?Physical Exam: ?Vitals:  ? 08/03/21 0624 08/03/21 0628 08/03/21 0804 08/03/21 1245  ?BP:  (!) 167/59 (!) 161/53 97/70  ?Pulse: (!) 57  (!) 59 60  ?Resp: '16  19 16  '$ ?Temp: 98 ?F (36.7 ?C)  98.1 ?F (36.7 ?C) (!) 97.5 ?F (36.4 ?C)  ?TempSrc: Oral  Oral Oral  ?SpO2: 98%  98% 94%  ?Weight:  84.5 kg    ?Height: '5\' 5"'$  (1.651 m)     ? ?Adult female, lying in bed,  interactive and appropriate ?RRR, no murmurs, no peripheral edema ?Lung sounds clear without rales or wheeze ?Abdomen soft no tenderness palpation or guarding ?Peripheral pulses normal, distal extremities warm and well-perfused ?No rashes or changes to skin over the anterior legs, no swelling of the legs, mild bruising of the right knee, but no tenderness palpation with gentle range of motion passively or with palpation of either knee.  No  deformities of either knee. ?Generalized weakness of bilateral hip flexors, upper extremities with normal strength.  Normal, affect normal, oriented to person, place, and time, forgetful as to date, suspect some mild dementia  ?speech fluent, face symmetric ? ? ? ? ? ? ?Data Reviewed: ?Neurosurgery notes reviewed, vital signs reviewed ?MRI lumbar spine and CT lumbar spine reports reviewed ?Hemoglobin 10 ?Troponin 50s ?CK normal ?Creatinine 1.2 down to 0.8 ?LFTs normal ?Phosphate low ?Chest x-ray clear ?Urinalysis clear ? ? ? ? ? ?Family Communication: Husband at the bedside ? ? ?  ?Disposition: ?Patient presents with subsidence (migration) of her new implants leading to radiculitis causing worsening right > left leg weakness and pain causing ambulatory dysfunction.  The treatment for this would be surgery at this time, but her recent stent placement creates an unacceptable risk of surgery at this time (bleeding if Plavix were continued or in-stent thrombus is Plavix were stopped) at least for the next month. ? ? ? ? ? ? ? ?Author: ?Edwin Dada, MD ?08/03/2021 1:46 PM ? ?For on call review [email or web address].  ? ? ?

## 2021-08-03 NOTE — Progress Notes (Signed)
Dr. Ronnald Ramp from neurosurgery has been contacted for consultation to assist with the management of her bilateral lower extremity weakness. ?

## 2021-08-03 NOTE — Hospital Course (Addendum)
Debbie [name] is a 76 y.o. F with spinal stenosis s/p recent lumbar fusion Mar 2023, subsequent CHF flare Mar 2023 then readmitted second time Apr 2023 for NSTEMI with stent placed to RCA 4/18 (3 days ago), also DM, pAF on Eliquis, HTN, and remote hx CVA who presented with acute weakness. ? ?Discharged from the hospital 1 day PTA.  At home the night of discharge, was in the bathtub and had onset of weakness.  EMS called to get her out of the bathtub, but by morning was no better, so husband tried to get her to the car, couldn't and called EMS again. ? ?In the ER, afebrile, hypertensive, WBC 11K, Cr up to 1.2 from baseline 0.8, UA clean. MRI spine obtained.   ? ?Admitted on fluids.  Neurosurgery consulted. ?

## 2021-08-03 NOTE — Assessment & Plan Note (Signed)
BMI 31 °

## 2021-08-03 NOTE — Evaluation (Addendum)
Occupational Therapy Evaluation Patient Details Name: Debbie [name] MRN: [id number] DOB: 08/10/1945 Today's Date: 08/03/2021   History of Present Illness 76 y/o female admitted 4/20 with bil LE weakness and pain. Recently admitted 4/16-4/19 with chest pain and s/p cardiac cath on 4/18. PMhx: Lumbar fusion in 06/2021,Afib, CVA, DM, CAD, and CHF.   Clinical Impression   PTA, pt was living with her husband and was performing BADLs. Pt currently requiring Min A for UB ADLs, Max A for LB ADLs, and Min A for functional transfer. Pt presenting with significant pain at BLEs limiting her functional performance. Pt reporting rest breaks in supine to control pain. Pt describing pain at burning sensation down her legs (this session R>L). HR 40-60s. Pt would benefit from further acute OT to facilitate safe dc. Recommend dc to SNF for further OT to optimize safety, independence with ADLs, and return to PLOF.      Recommendations for follow up therapy are one component of a multi-disciplinary discharge planning process, led by the attending physician.  Recommendations may be updated based on patient status, additional functional criteria and insurance authorization.   Follow Up Recommendations  Skilled nursing-short term rehab (<3 hours/day)    Assistance Recommended at Discharge Frequent or constant Supervision/Assistance  Patient can return home with the following A little help with walking and/or transfers;A little help with bathing/dressing/bathroom;Assistance with cooking/housework;Direct supervision/assist for medications management;Direct supervision/assist for financial management;Assist for transportation;Help with stairs or ramp for entrance    Functional Status Assessment  Patient has had a recent decline in their functional status and demonstrates the ability to make significant improvements in function in a reasonable and predictable amount of time.  Equipment Recommendations  None recommended  by OT    Recommendations for Other Services       Precautions / Restrictions Precautions Precautions: Fall Precaution Comments: 4/18 radial cath Restrictions Weight Bearing Restrictions: No      Mobility Bed Mobility Overal bed mobility: Needs Assistance Bed Mobility: Rolling, Sidelying to Sit Rolling: Min assist Sidelying to sit: Min assist       General bed mobility comments: Min A for rolling to L and then elevating trunk    Transfers Overall transfer level: Needs assistance Equipment used: Rolling walker (2 wheels) Transfers: Sit to/from Stand, Bed to chair/wheelchair/BSC Sit to Stand: Min assist     Step pivot transfers: Min assist     General transfer comment: Min A for maintaining balance      Balance Overall balance assessment: Needs assistance Sitting-balance support: Feet supported, No upper extremity supported Sitting balance-Leahy Scale: Fair     Standing balance support: Single extremity supported, During functional activity Standing balance-Leahy Scale: Poor                             ADL either performed or assessed with clinical judgement   ADL Overall ADL's : Needs assistance/impaired Eating/Feeding: Set up;Sitting   Grooming: Supervision/safety;Set up;Sitting   Upper Body Bathing: Minimal assistance;Sitting   Lower Body Bathing: Maximal assistance;Sit to/from stand   Upper Body Dressing : Set up;Sitting   Lower Body Dressing: Maximal assistance;Sit to/from stand   Toilet Transfer: Minimal assistance;Rolling walker (2 wheels);BSC/3in1;Stand-pivot Toilet Transfer Details (indicate cue type and reason): Min A for stand pivot to recliner.         Functional mobility during ADLs: Minimal assistance;Cueing for safety (stand pivot only) General ADL Comments: Pt sitting at EOB and able to maintain sitting  for ~60 sec before burning sensation/pain occured down RLE. Pt laying back in supine and breathing to reduce pain. Pt  agreeable to attempt OOB to recline with assist from therapist. Once pain gone, pt performing stand pivot with Min A. Able to sit up for ~2 minutes before second wave of burning pain. Reclinering chair and elevating legs. Pain subsiding and resuing upright posture for eating lunch.     Vision Baseline Vision/History: 1 Wears glasses Vision Assessment?: No apparent visual deficits     Perception     Praxis      Pertinent Vitals/Pain Pain Assessment Pain Assessment: Faces Faces Pain Scale: Hurts whole lot Pain Location: Bilateral LEs (R>L); anterior portion. Pain Descriptors / Indicators: Burning Pain Intervention(s): Monitored during session, Repositioned, Relaxation     Hand Dominance Right   Extremity/Trunk Assessment Upper Extremity Assessment Upper Extremity Assessment: Overall WFL for tasks assessed   Lower Extremity Assessment Lower Extremity Assessment: Defer to PT evaluation   Cervical / Trunk Assessment Cervical / Trunk Assessment: Normal   Communication Communication Communication: No difficulties   Cognition Arousal/Alertness: Awake/alert Behavior During Therapy: WFL for tasks assessed/performed Overall Cognitive Status: Impaired/Different from baseline Area of Impairment: Memory, Following commands                     Memory: Decreased short-term memory Following Commands: Follows one step commands inconsistently       General Comments: Following commands and agreeable to therapy. Highly distracted when pain occurs. Poor safety awareness when pain happens.     General Comments  HR 40-60s    Exercises Exercises: Other exercises Other Exercises Other Exercises: Supine, trunk twists with knees bent. gentle movements. Cues for core engagement. No burning sensation/pain. x5   Shoulder Instructions      Home Living Family/patient expects to be discharged to:: Private residence Living Arrangements: Spouse/significant other Available Help at  Discharge: Family;Available 24 hours/day Type of Home: House Home Access: Stairs to enter [employer] of Steps: 2 Entrance Stairs-Rails: None Home Layout: One level     Bathroom Shower/Tub: Chief Strategy Officer: Handicapped height     Home Equipment: Agricultural consultant (2 wheels);Cane - single point;BSC/3in1;Shower seat;Grab bars - tub/shower          Prior Functioning/Environment Prior Level of Function : Needs assist             Mobility Comments: Uses RW since back surgery, has had highly varied mobility for the last several weeks with periods of inability to stand and other times walking with several controlled falls ADLs Comments: reports was bathing and dressing without assist, spouse manages meds        OT Problem List: Decreased strength;Decreased activity tolerance;Impaired balance (sitting and/or standing);Decreased cognition;Decreased safety awareness;Decreased coordination;Decreased knowledge of use of DME or AE;Cardiopulmonary status limiting activity;Decreased knowledge of precautions;Pain;Increased edema      OT Treatment/Interventions: Self-care/ADL training;Therapeutic exercise;Therapeutic activities;Cognitive remediation/compensation;Patient/family education;Balance training;DME and/or AE instruction    OT Goals(Current goals can be found in the care plan section) Acute Rehab OT Goals Patient Stated Goal: Get stronger OT Goal Formulation: With patient Time For Goal Achievement: 08/17/21 Potential to Achieve Goals: Good  OT Frequency: Min 2X/week    Co-evaluation              AM-PAC OT "6 Clicks" Daily Activity     Outcome Measure Help from another person eating meals?: A Little Help from another person taking care of personal grooming?: A Little Help  from another person toileting, which includes using toliet, bedpan, or urinal?: A Little Help from another person bathing (including washing, rinsing, drying)?: A Lot Help  from another person to put on and taking off regular upper body clothing?: A Little Help from another person to put on and taking off regular lower body clothing?: A Lot 6 Click Score: 16   End of Session Nurse Communication: Mobility status  Activity Tolerance: Patient tolerated treatment well;Other (comment) (Limited by decreased activity tolerance) Patient left: with call bell/phone within reach;in chair;with chair alarm set  OT Visit Diagnosis: Unsteadiness on feet (R26.81);Other abnormalities of gait and mobility (R26.89);Muscle weakness (generalized) (M62.81);Pain Pain - Right/Left: Right (bil) Pain - part of body: Leg                Time: 1103-1130 OT Time Calculation (min): 27 min Charges:  OT General Charges $OT Visit: 1 Visit OT Evaluation $OT Eval Moderate Complexity: 1 Mod OT Treatments $Therapeutic Activity: 8-22 mins  Graceyn Fodor MSOT, OTR/L Acute Rehab Pager: [phone] Office: [phone]  Theodoro Grist Karina Nofsinger 08/03/2021, 1:19 PM

## 2021-08-03 NOTE — Progress Notes (Signed)
I reviewed her CT scan of the lumbar spine.  There is worsening subsidence at the L4-5 level with vacuum phenomenon, and I think there is probably some mild lucency around the left L5 screw more than the right screw.  This suggests that this will not go on to arthrodesis.  L3-4 looks good. ? ?I am concerned that she will need revision of her fusion with extension of her screws down to S1 with a posterior lateral fusion L4-S1.  However, given her recent stent placement and her need for aspirin, Plavix, and Eliquis, it appears she will not be a surgical candidate for quite a long time.  She would have to be cleared by cardiology and off of her antiplatelet agents and anticoagulants except for aspirin for such surgery. ? ?This makes management of this condition very difficult.  1 option would be bedrest until the time of surgery. However, this does not appear to be very practical.  This is a really difficult situation.  We will order an LSO brace, not sure will offer the type of stability that she would need to go on to heal this. ? ? ?

## 2021-08-03 NOTE — Assessment & Plan Note (Addendum)
Troponin minimally elevated.  No chest pain.  At this time I have low suspicion for ischemia or CHF. ?

## 2021-08-03 NOTE — Assessment & Plan Note (Signed)
-   Supplement Phos 

## 2021-08-03 NOTE — Assessment & Plan Note (Addendum)
Recent DES to proximal RCA on 4/18.   ? ?I discussed with on call Cardiology, who confirmed typically patients would need 6 months Plavix post DES. ? ?- We recommend outpatient follow up with Dr. Claiborne Billings, who would be ideal to judge when Plavix could safely be held and under what extenuating circumstances (if any) surgery could occur before 6 months ? ?-Continue aspirin, Zetia, clopidogrel ?-Continue metoprolol, Crestor ?

## 2021-08-03 NOTE — Assessment & Plan Note (Signed)
-  Continue Zetia, Crestor ?

## 2021-08-03 NOTE — Assessment & Plan Note (Signed)
Hgb stable relative to baseline 

## 2021-08-03 NOTE — NC FL2 (Signed)
?[place] MEDICAID FL2 LEVEL OF CARE SCREENING TOOL  ?  ? ?IDENTIFICATION  ?Patient Name: ?Debbie [name] Birthdate: 11-15-1945 Sex: female Admission Date (Current Location): ?08/02/2021  ?[place] and [place] Number: ? [place] ?  Facility and Address:  ?[facility], [place] [address], [place], [place] [address] ?     Provider Number: ?[id number]  ?Attending Physician Name and Address:  ?Edwin Dada, * ? Relative Name and Phone Number:  ?Gwyndolyn Saxon [phone] ?   ?Current Level of Care: ?Hospital Recommended Level of Care: ?[place] Prior Approval Number: ?  ? ?Date Approved/Denied: ?  PASRR Number: ?[id number] A ? ?Discharge Plan: ?SNF ?  ? ?Current Diagnoses: ?Patient Active Problem List  ? Diagnosis Date Noted  ? AKI (acute kidney injury) ([place]) 08/03/2021  ? Myocardial injury 08/03/2021  ? Hypophosphatemia 08/03/2021  ? Obesity (BMI 30-39.9) 08/03/2021  ? Peripheral neuropathy 08/03/2021  ? Weakness 08/02/2021  ? Normocytic anemia 07/29/2021  ? Chronic diastolic CHF (congestive heart failure) ([place]) 07/10/2021  ? Hypokalemia 07/10/2021  ? Hypomagnesemia 07/10/2021  ? Acute congestive heart failure ([place]) 07/09/2021  ? Mixed diabetic hyperlipidemia associated with type 2 diabetes mellitus ([place]) 07/09/2021  ? S/P lumbar fusion 07/04/2021  ? Ovarian mass 03/29/2020  ? S/P hysterectomy 03/29/2020  ? Bilateral occipital neuralgia 11/06/2017  ? Coronary artery disease involving native coronary artery of native heart 03/02/2016  ? Chest pain with moderate risk for cardiac etiology 01/10/2016  ? Type 2 diabetes mellitus without complication, without long-term current use of insulin ([place]) 12/29/2012  ? CAD S/P percutaneous coronary angioplasty 09/22/2012  ? History of CVA, 1998  09/22/2012  ? Bradycardia Aug 2013 12/14/2011  ? PAF (paroxysmal atrial fibrillation) ([place]) 12/14/2011  ? Essential hypertension 12/14/2011  ? Hyperlipidemia 12/14/2011  ? ? ?Orientation RESPIRATION  BLADDER Height & Weight   ?  ?Self, Time, Situation, Place ? Normal Continent, External catheter (External Urinary Catheter) Weight: 186 lb 4.6 oz (84.5 kg) ?Height:  '5\' 5"'$  (165.1 cm)  ?BEHAVIORAL SYMPTOMS/MOOD NEUROLOGICAL BOWEL NUTRITION STATUS  ?    Continent Diet (Please see discharge summary)  ?AMBULATORY STATUS COMMUNICATION OF NEEDS Skin   ?Extensive Assist Verbally Other (Comment) (Appropriate for ethnicty,dry,ecchymosis buttocks,leg,hip,arm,bilateral,Incision closed back,clean,dry) ?  ?  ?  ?    ?     ?     ? ? ?Personal Care Assistance Level of Assistance  ?Bathing, Feeding, Dressing Bathing Assistance: Maximum assistance ?Feeding assistance: Independent (able to feed self) ?Dressing Assistance: Maximum assistance ?   ? ?Functional Limitations Info  ?Sight, Hearing, Speech Sight Info:  (WDL) ?Hearing Info: Adequate ?Speech Info: Adequate  ? ? ?SPECIAL CARE FACTORS FREQUENCY  ?PT (By licensed PT), OT (By licensed OT)   ?  ?PT Frequency: 5x min weekly ?OT Frequency: 5x min weekly ?  ?  ?  ?   ? ? ?Contractures Contractures Info: Not present  ? ? ?Additional Factors Info  ?Code Status, Allergies, Insulin Sliding Scale Code Status Info: FULL ?Allergies Info: No Known Allergies ?  ?Insulin Sliding Scale Info: insulin aspart (novoLOG) injection 0-5 Units daily at bedtime,insulin aspart (novoLOG) injection 0-9 Units 3 times daily at meals ?  ?   ? ?Current Medications (08/03/2021):  This is the current hospital active medication list ?Current Facility-Administered Medications  ?Medication Dose Route Frequency Provider Last Rate Last Admin  ? acetaminophen (TYLENOL) tablet 650 mg  650 mg Oral Q6H PRN Kayleen Memos, DO   650 mg at 08/03/21 1694  ?  amLODipine (NORVASC) tablet 10 mg  10 mg Oral Daily Edwin Dada, MD   10 mg at 08/03/21 2694  ? apixaban (ELIQUIS) tablet 5 mg  5 mg Oral BID Irene Pap N, DO   5 mg at 08/03/21 8546  ? aspirin chewable tablet 81 mg  81 mg Oral Daily Kayleen Memos, DO   81  mg at 08/03/21 2703  ? clopidogrel (PLAVIX) tablet 75 mg  75 mg Oral Q breakfast Kayleen Memos, DO   75 mg at 08/03/21 5009  ? ezetimibe (ZETIA) tablet 10 mg  10 mg Oral Daily Irene Pap N, DO   10 mg at 08/03/21 3818  ? furosemide (LASIX) tablet 20 mg  20 mg Oral Daily Edwin Dada, MD   20 mg at 08/03/21 2993  ? insulin aspart (novoLOG) injection 0-5 Units  0-5 Units Subcutaneous QHS Irene Pap N, DO      ? insulin aspart (novoLOG) injection 0-9 Units  0-9 Units Subcutaneous TID WC Irene Pap N, DO   1 Units at 08/03/21 1202  ? melatonin tablet 3 mg  3 mg Oral QHS PRN Irene Pap N, DO      ? metoprolol succinate (TOPROL-XL) 24 hr tablet 100 mg  100 mg Oral Daily Irene Pap N, DO   100 mg at 08/03/21 7169  ? mirabegron ER (MYRBETRIQ) tablet 50 mg  50 mg Oral Daily Danford, Suann Larry, MD   50 mg at 08/03/21 6789  ? multivitamin with minerals tablet 1 tablet  1 tablet Oral Daily Irene Pap N, DO   1 tablet at 08/03/21 3810  ? nitroGLYCERIN (NITROSTAT) SL tablet 0.4 mg  0.4 mg Sublingual Q5 min PRN Danford, Suann Larry, MD      ? phosphorus (K PHOS NEUTRAL) tablet 500 mg  500 mg Oral TID AC & HS Danford, Suann Larry, MD   500 mg at 08/03/21 1202  ? polyethylene glycol (MIRALAX / GLYCOLAX) packet 17 g  17 g Oral Daily PRN Irene Pap N, DO      ? pregabalin (LYRICA) capsule 25 mg  25 mg Oral BID Edwin Dada, MD   25 mg at 08/03/21 1201  ? prochlorperazine (COMPAZINE) injection 10 mg  10 mg Intravenous Q6H PRN Irene Pap N, DO      ? rosuvastatin (CRESTOR) tablet 5 mg  5 mg Oral Daily Grill, Carole N, DO   5 mg at 08/03/21 1751  ? ? ? ?Discharge Medications: ?Please see discharge summary for a list of discharge medications. ? ?Relevant Imaging Results: ? ?Relevant Lab Results: ? ? ?Additional Information ?[id number], Both Covid Vaccines and 1 booster ? ?Milas Gain, LCSWA ? ? ? ? ?

## 2021-08-03 NOTE — Assessment & Plan Note (Addendum)
BP still elevated due to pain ?-Continue amlodipine, furosemide, metoprolol, olmesartan ? ?

## 2021-08-03 NOTE — Progress Notes (Signed)
Orthopedic Tech Progress Note ?Patient Details:  ?Debbie [name] ?05-13-1945 ?[id number] ? ?Ortho Devices ?Type of Ortho Device: Lumbar corsett ?Ortho Device/Splint Interventions: Ordered ?  ?Post Interventions ?Instructions Provided: Adjustment of device, Care of device ? ?Vernona Rieger ?08/03/2021, 1:31 PM ? ?

## 2021-08-03 NOTE — Evaluation (Signed)
Physical Therapy Evaluation ?Patient Details ?Name: Debbie [name] ?MRN: [id number] ?DOB: 03/03/1946 ?Today's Date: 08/03/2021 ? ?History of Present Illness ? 76 y/o female admitted 4/20 with bil LE weakness and pain. Pt admitted 4/16-4/19 with chest pain. cardiac cath 4/18. PMhx: Lumbar fusion in 06/2021,Afib, CVA, DM, CAD, and CHF.  ?Clinical Impression ? Pt pleasant and very willing to attempt mobility. Spouse present throughout session. Pt reporting intermittent bil knee pain previously but post back fusion and that at times can be debilitating. Pt and spouse have difficulty with exact time line stating pt walked to post op NS visit but then has times that she can't stand due to pain. Today pt significantly limited by pain and weakness and unable to tolerate maintained standing to progress OOB. Pt and spouse concerned with return home as spouse unable to lift pt. Pt needs to be able to perform basic transfers and gait to safely return home and will benefit from acute therapy to maximize mobility, safety and function to decrease burden of care.    ?   ? ?Recommendations for follow up therapy are one component of a multi-disciplinary discharge planning process, led by the attending physician.  Recommendations may be updated based on patient status, additional functional criteria and insurance authorization. ? ?Follow Up Recommendations Skilled nursing-short term rehab (<3 hours/day) ? ?  ?Assistance Recommended at Discharge Frequent or constant Supervision/Assistance  ?Patient can return home with the following ? Help with stairs or ramp for entrance;Assist for transportation;Assistance with cooking/housework;A lot of help with walking and/or transfers;Direct supervision/assist for medications management;A lot of help with bathing/dressing/bathroom ? ?  ?Equipment Recommendations None recommended by PT  ?Recommendations for Other Services ?    ?  ?Functional Status Assessment Patient has had a recent decline in their  functional status and demonstrates the ability to make significant improvements in function in a reasonable and predictable amount of time.  ? ?  ?Precautions / Restrictions Precautions ?Precautions: Fall ?Precaution Comments: 4/18 radial cath  ? ?  ? ?Mobility ? Bed Mobility ?Overal bed mobility: Needs Assistance ?Bed Mobility: Rolling, Sit to Supine, Sidelying to Sit ?Rolling: Min assist ?Sidelying to sit: Min assist ?  ?Sit to supine: Mod assist ?  ?General bed mobility comments: min assist to roll to left, lift trunk and stabilize in sitting with posterior lean sitting EOB and min assist for balance. pt stood x 1 then immediately sat reporting excruciating pain bil knees with mod assist to scoot toward [facility] and transition to supine ?  ? ?Transfers ?Overall transfer level: Needs assistance ?  ?Transfers: Sit to/from Stand ?Sit to Stand: Min assist ?  ?  ?  ?  ?  ?General transfer comment: min assist to stand x 1 with pt unable to tolerate weight on legs reporting pain with immediate return to sitting and supine with pt unable to make additional attempts ?  ? ?Ambulation/Gait ?  ?  ?  ?  ?  ?  ?  ?  ? ?Stairs ?  ?  ?  ?  ?  ? ?Wheelchair Mobility ?  ? ?Modified Rankin (Stroke Patients Only) ?  ? ?  ? ?Balance Overall balance assessment: Needs assistance ?  ?Sitting balance-Leahy Scale: Poor ?Sitting balance - Comments: min assist with posterior lean ?Postural control: Posterior lean ?Standing balance support: Single extremity supported ?Standing balance-Leahy Scale: Poor ?Standing balance comment: brief standing with single UE support ?  ?  ?  ?  ?  ?  ?  ?  ?  ?  ?  ?   ? ? ? ?  Pertinent Vitals/Pain Pain Assessment ?Pain Score: 10-Worst pain ever ?Pain Location: bilateral knees with weight bearing ?Pain Descriptors / Indicators: Constant, Stabbing ?Pain Intervention(s): Limited activity within patient's tolerance, Monitored during session, Repositioned  ? ? ?Home Living Family/patient expects to be discharged to::  Private residence ?Living Arrangements: Spouse/significant other ?Available Help at Discharge: Family;Available 24 hours/day ?Type of Home: House ?Home Access: Stairs to enter ?Entrance Stairs-Rails: None ?Entrance Stairs-Number of Steps: 2 ?  ?Home Layout: One level ?Home Equipment: Conservation officer, nature (2 wheels);Cane - single point;BSC/3in1;Shower seat;Grab bars - tub/shower ?   ?  ?Prior Function Prior Level of Function : Needs assist ?  ?  ?  ?  ?  ?  ?Mobility Comments: Uses RW since back surgery, has had highly varied mobility for the last several weeks with periods of inability to stand and other times walking with several controlled falls ?ADLs Comments: reports was bathing and dressing without assist, spouse manages meds ?  ? ? ?Hand Dominance  ?   ? ?  ?Extremity/Trunk Assessment  ? Upper Extremity Assessment ?Upper Extremity Assessment: Overall WFL for tasks assessed ?  ? ?Lower Extremity Assessment ?Lower Extremity Assessment: Generalized weakness ?  ? ?Cervical / Trunk Assessment ?Cervical / Trunk Assessment: Normal  ?Communication  ? Communication: No difficulties  ?Cognition Arousal/Alertness: Awake/alert ?Behavior During Therapy: [facility] for tasks assessed/performed ?Overall Cognitive Status: Impaired/Different from baseline ?Area of Impairment: Memory, Following commands ?  ?  ?  ?  ?  ?  ?  ?  ?  ?  ?Memory: Decreased short-term memory ?Following Commands: Follows one step commands inconsistently ?  ?  ?  ?General Comments: pt with decreased awareness of deficits with inconsistent history and difficult to pinpoint when pain started ?  ?  ? ?  ?General Comments   ? ?  ?Exercises    ? ?Assessment/Plan  ?  ?PT Assessment Patient needs continued PT services  ?PT Problem List Decreased strength;Decreased activity tolerance;Decreased balance;Decreased mobility;Decreased knowledge of use of DME;Decreased safety awareness;Cardiopulmonary status limiting activity;Decreased knowledge of precautions;Impaired  sensation;Pain;Decreased range of motion;Decreased cognition ? ?   ?  ?PT Treatment Interventions DME instruction;Stair training;Gait training;Functional mobility training;Therapeutic activities;Therapeutic exercise;Balance training;Patient/family education   ? ?PT Goals (Current goals can be found in the Care Plan section)  ?Acute Rehab PT Goals ?Patient Stated Goal: be able to walk ?PT Goal Formulation: With patient/family ?Time For Goal Achievement: 08/17/21 ?Potential to Achieve Goals: Fair ? ?  ?Frequency Min 3X/week ?  ? ? ?Co-evaluation   ?  ?  ?  ?  ? ? ?  ?AM-PAC PT "6 Clicks" Mobility  ?Outcome Measure Help needed turning from your back to your side while in a flat bed without using bedrails?: A Little ?Help needed moving from lying on your back to sitting on the side of a flat bed without using bedrails?: A Lot ?Help needed moving to and from a bed to a chair (including a wheelchair)?: Total ?Help needed standing up from a chair using your arms (e.g., wheelchair or bedside chair)?: A Little ?Help needed to walk in hospital room?: Total ?Help needed climbing 3-5 steps with a railing? : Total ?6 Click Score: 11 ? ?  ?End of Session   ?Activity Tolerance: Patient limited by pain ?Patient left: in bed;with call bell/phone within reach;with bed alarm set;with family/visitor present ?Nurse Communication: Mobility status ?PT Visit Diagnosis: Unsteadiness on feet (R26.81);Pain;Other abnormalities of gait and mobility (R26.89);Difficulty in walking, not elsewhere classified (R26.2);Repeated falls (R29.6) ?Pain -  Right/Left: Left ?Pain - part of body: Knee ?  ? ?Time: 410 110 5090 ?PT Time Calculation (min) (ACUTE ONLY): 29 min ? ? ?Charges:   PT Evaluation ?$PT Eval Moderate Complexity: 1 Mod ?PT Treatments ?$Therapeutic Activity: 8-22 mins ?  ?   ? ? ?Adalind Weitz P, PT ?Acute Rehabilitation Services ?Pager: [phone] ?Office: [phone] ? ? ?Ariba Lehnen B Chanson Teems ?08/03/2021, 10:53 AM ? ?

## 2021-08-03 NOTE — Assessment & Plan Note (Addendum)
Eliquis could be held for surgery at any time. ?-Continue Eliquis, metoprolol ?

## 2021-08-03 NOTE — TOC Progression Note (Addendum)
Transition of Care (TOC) - Progression Note  ? ? ?Patient Details  ?Name: Debbie [name] ?MRN: [id number] ?Date of Birth: 05-22-1945 ? ?Transition of Care (TOC) CM/SW Contact  ?Milas Gain, LCSWA ?Phone Number: ?08/03/2021, 2:11 PM ? ?Clinical Narrative:    ? ?Update- CSW received callback from [place] with [facility] who confirmed she can accept patient tomorrow if insurance authorization approved and patient medically ready for dc. Patients insurance authorization currently pending.CSW updated patients spouse Debbie [name]. All questions answered. No further questions reported at this time. ? ?Update- CSW met with patient at bedside and provided SNF bed offers. Patient chose SNF placement at [facility] awaiting callback from [place] with [facility] to confirm they can offer SNF bed for patient. ? ? ?CSW started insurance authorization for patient. Reference number # [id number]. Insurance authorization currently pending. Pending SNF bed offers. CSW will continue to follow and assist with patients dc planning needs. ? ?Expected Discharge Plan: [place] ?Barriers to Discharge: Continued Medical Work up ? ?Expected Discharge Plan and Services ?Expected Discharge Plan: [place] ?In-house Referral: Clinical Social Work ?  ?  ?Living arrangements for the past 2 months: [place] ?                ?  ?  ?  ?  ?  ?  ?  ?  ?  ?  ? ? ?Social Determinants of Health (SDOH) Interventions ?  ? ?Readmission Risk Interventions ?   ? View : No data to display.  ?  ?  ?  ? ? ?

## 2021-08-03 NOTE — Progress Notes (Addendum)
Subjective: ?Patient reports continued burning anterior thigh pain.  She was admitted yesterday with "leg weakness."  Sounds like she has fallen a few times.  She states her legs just "give out."  She has no back pain or buttocks pain.  She does have some groin pain.  She is on Eliquis and Plavix.  There is bruising to the lower leg and she is tender there.  She had an MRI done yesterday which I have reviewed.  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 ? ?Objective: ?Vital signs in last 24 hours: ?Temp:  [98 ?F (36.7 ?C)-98.7 ?F (37.1 ?C)] 98 ?F (36.7 ?C) (04/21 9381) ?Pulse Rate:  [55-66] 57 (04/21 0624) ?Resp:  [16-18] 16 (04/21 8299) ?BP: (128-172)/(56-76) 167/59 (04/21 3716) ?SpO2:  [94 %-99 %] 98 % (04/21 0624) ?Weight:  [84.5 kg] 84.5 kg (04/21 9678) ? ?Intake/Output from previous day: ?04/20 0701 - 04/21 0700 ?In: 1240 [P.O.:240; IV Piggyback:1000] ?Out: -  ?Intake/Output this shift: ?No intake/output data recorded. ? ?Neurologic: Grossly normal in the upper extremities, she has good dorsiflexion and plantarflexion bilaterally, she has good knee extension bilaterally, she is nontender to palpation around the knees and has no pain with flexion of the knees, her left hip flexor is 4 out of 5 in the right hip flexor is 2 out of 5, there is some pain with external rotation of the right hip ? ?Lab Results: ?Lab Results  ?Component Value Date  ? WBC 7.2 08/03/2021  ? HGB 10.2 (L) 08/03/2021  ? HCT 31.9 (L) 08/03/2021  ? MCV 95.8 08/03/2021  ? PLT 251 08/03/2021  ? ?Lab Results  ?Component Value Date  ? INR 1.1 07/02/2021  ? ?BMET ?Lab Results  ?Component Value Date  ? NA 142 08/03/2021  ? K 3.5 08/03/2021  ? CL 112 (H) 08/03/2021  ? CO2 23 08/03/2021  ? GLUCOSE 112 (H) 08/03/2021  ? BUN 6 (L) 08/03/2021  ? CREATININE 0.88 08/03/2021  ? CALCIUM 9.5 08/03/2021  ? ? ?Studies/Results: ?DG Lumbar Spine Complete ? ?Result Date:  08/02/2021 ?CLINICAL DATA:  Fall, back pain EXAM: LUMBAR SPINE - COMPLETE 4+ VIEW COMPARISON:  07/17/2021 FINDINGS: L3-L5 lumbar fusion with instrumentation has been performed. Stable grade 1 anterolisthesis L4-5. No acute fracture. Vertebral body height is preserved. There is preserved intervertebral disc heights at the remaining levels within the lumbar spine. The paraspinal soft tissues are unremarkable. Vascular calcifications are noted within the abdominal aorta. IMPRESSION: No acute fracture or traumatic listhesis of the lumbar spine. L3-L5 lumbar fusion with instrumentation. Electronically Signed   By: Fidela Salisbury M.D.   On: 08/02/2021 19:55  ? ?DG Tibia/Fibula Left ? ?Result Date: 08/02/2021 ?CLINICAL DATA:  Left ankle pain following fall EXAM: LEFT TIBIA AND FIBULA - 2 VIEW COMPARISON:  None. FINDINGS: There is no evidence of fracture or other focal bone lesions. Soft tissues are unremarkable. IMPRESSION: Negative. Electronically Signed   By: Fidela Salisbury M.D.   On: 08/02/2021 19:56  ? ?DG Ankle Complete Right ? ?Result Date: 08/02/2021 ?CLINICAL DATA:  Fall, right ankle pain fall are fall pain there is EXAM: RIGHT ANKLE - COMPLETE 3+ VIEW COMPARISON:  None. FINDINGS: There is mild soft tissue swelling dorsal to the kidney a forms. There is a small ossific fracture fragment arising from the dorsal aspect of the anterior process of the talus, best seen on sagittal view which may represent a small  capsular avulsive fracture. No other fracture identified. Normal overall alignment. Mild soft tissue swelling superficial to the lateral malleolus. No ankle effusion. IMPRESSION: Soft tissue swelling dorsal to the lateral malleolus and midfoot. Possible small avulsive fracture fragment from the dorsal aspect of the anterior process of the talus. Correlation for point tenderness would be helpful in determining acuity. Electronically Signed   By: Fidela Salisbury M.D.   On: 08/02/2021 19:51  ? ?MR Lumbar Spine W Wo  Contrast ? ?Result Date: 08/03/2021 ?CLINICAL DATA:  Weakness.  Recent lumbar fusion. EXAM: MRI LUMBAR SPINE WITHOUT AND WITH CONTRAST TECHNIQUE: Multiplanar and multiecho pulse sequences of the lumbar spine were obtained without and with intravenous contrast. CONTRAST:  29m GADAVIST GADOBUTROL 1 MMOL/ML IV SOLN COMPARISON:  03/26/2021 FINDINGS: Segmentation:  Standard Alignment:  Grade 1 anterolisthesis at L4-5 Vertebrae: L3-5 PLIF. No acute abnormality. There is disc spacer subsidence at L4-5 with depression of the L5 superior endplate. Conus medullaris and cauda equina: Conus extends to the L1 level. Conus and cauda equina appear normal. Paraspinal and other soft tissues: Negative. Disc levels: L1-L2: Normal disc space and facet joints. No spinal canal stenosis. No neural foraminal stenosis. L2-L3: Normal disc space and facet joints. No spinal canal stenosis. No neural foraminal stenosis. L3-L4: PLIF. No spinal canal stenosis. Improved patency of the neural foramina with mild residual bilateral neural foraminal stenosis. L4-L5: PLIF. No spinal canal stenosis. Slightly worsened mild bilateral neural foraminal stenosis. L5-S1: New small right subarticular disc protrusion with annular fissure in close proximity to the exiting right L5 nerve root. No spinal canal stenosis. No neural foraminal stenosis. Visualized sacrum: Normal. IMPRESSION: 1. New L5-S1 small right subarticular disc protrusion with annular fissure in close proximity to the exiting right L5 nerve root. Correlate for right L5 radiculopathy. 2. L3-5 PLIF with improved patency of the L3-4 neural foramina and no spinal canal stenosis. 3. Disc spacer subsidence at L4-5 with depression of the L5 superior endplate. Slightly worsened mild bilateral neural foraminal stenosis at L4-5. Electronically Signed   By: KUlyses JarredM.D.   On: 08/03/2021 04:00  ? ?DG Chest Port 1 View ? ?Result Date: 08/02/2021 ?CLINICAL DATA:  Fall, chest pain EXAM: PORTABLE CHEST 1  VIEW COMPARISON:  None. FINDINGS: Lung volumes are small. Interstitial opacities within the left lung base are chronic in nature in keeping with mild subpleural fibrosis better seen on CT examination of 07/29/2021. No superimposed confluent pulmonary infiltrate. No pneumothorax or pleural effusion. Cardiac size is mildly enlarged, unchanged. No acute bone abnormality. IMPRESSION: No radiographic evidence of acute cardiopulmonary disease. Stable cardiomegaly. Electronically Signed   By: AFidela SalisburyM.D.   On: 08/02/2021 19:53  ? ?DG Knee Complete 4 Views Right ? ?Result Date: 08/02/2021 ?CLINICAL DATA:  Right knee pain following fall. EXAM: RIGHT KNEE - COMPLETE 4+ VIEW COMPARISON:  None. FINDINGS: Soft tissue swelling about the anterior superior aspect of the tibia without associated fracture or radiopaque foreign body. No definite knee joint effusion. Joint spaces appear preserved however a small amount of chondrocalcinosis is seen within both the medial and lateral joint spaces. IMPRESSION: 1. Soft tissue swelling about the anterior aspect the knee without associated fracture or dislocation. 2. 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 Electronically Signed   By: JSandi MariscalM.D.   On: 08/02/2021 16:46   ? ?Assessment/Plan: ?MRI reviewed and other than subsidence of the cages into the L5 vertebral body which is a known finding, I see no canal  stenosis or neural compression. ? ?Her burning leg pain may be radiculitis from the subsidence of the cages and the changes related to that.  I would like to work this up further with a CT scan of the lumbar spine to get a better look at the hardware.  It is interesting that she has no back pain whatsoever and no pain in the piriformis region which would be expected if the pain in her legs were from the subsidence of the cages or radiculopathy. ? ?She may need imaging of the pelvis to look at the hips.  She has new weakness in the right hip flexor and that  is less likely to be from the surgery at L3-4 and L4-5.  Could be related to hip disease. ? ?Agree with PT and OT.  We will look at her medications to see if we can help with the burning leg pain. ? ?Estimated body

## 2021-08-03 NOTE — ED Notes (Signed)
PT experienced episode of urinary incontinence. PT gown/linens changed, new brief applied, and PureWick in place ?

## 2021-08-03 NOTE — TOC Initial Note (Signed)
Transition of Care (TOC) - Initial/Assessment Note  ? ? ?Patient Details  ?Name: Debbie [name] ?MRN: [id number] ?Date of Birth: 12-Apr-1946 ? ?Transition of Care (TOC) CM/SW Contact:    ?Milas Gain, LCSWA ?Phone Number: ?08/03/2021, 12:35 PM ? ?Clinical Narrative:                 ? ?CSW received consult for possible SNF placement at time of discharge. CSW spoke with patient at bedside regarding PT recommendation of SNF placement at time of discharge. Patient reports she comes from home with spouse.Patient expressed understanding of PT recommendation and is agreeable to SNF placement at time of discharge. Patient gave CSW permission to fax out initial referral near the [place] area. CSW discussed insurance authorization process with patient and will provide Medicare SNF ratings list with accepted SNF bed offers when available. Patient reports she has received the COVID vaccines as well as 1 booster. No further questions reported at this time. CSW to continue to follow and assist with discharge planning needs.  ? ?Expected Discharge Plan: [place] ?Barriers to Discharge: Continued Medical Work up ? ? ?Patient Goals and CMS Choice ?Patient states their goals for this hospitalization and ongoing recovery are:: SNF ?CMS Medicare.gov Compare Post Acute Care list provided to:: Patient ?Choice offered to / list presented to : Patient ? ?Expected Discharge Plan and Services ?Expected Discharge Plan: [place] ?In-house Referral: Clinical Social Work ?  ?  ?Living arrangements for the past 2 months: [place] ?                ?  ?  ?  ?  ?  ?  ?  ?  ?  ?  ? ?Prior Living Arrangements/Services ?Living arrangements for the past 2 months: [place] ?Lives with:: Spouse ?Patient language and need for interpreter reviewed:: Yes ?Do you feel safe going back to the place where you live?: No   SNF  ?Need for Family Participation in Patient Care: Yes (Comment) ?Care giver support  system in place?: Yes (comment) ?  ?Criminal Activity/Legal Involvement Pertinent to Current Situation/Hospitalization: No - Comment as needed ? ?Activities of Daily Living ?  ?  ? ?Permission Sought/Granted ?Permission sought to share information with : Case Manager, Family Supports, Customer service manager ?Permission granted to share information with : Yes, Verbal Permission Granted ? Share Information with NAME: Gwyndolyn Saxon ? Permission granted to share info w AGENCY: SNF ? Permission granted to share info w Relationship: spouse ? Permission granted to share info w Contact Information: Gwyndolyn Saxon [phone] ? ?Emotional Assessment ?Appearance:: Appears stated age ?Attitude/Demeanor/Rapport: Gracious ?Affect (typically observed): Calm ?Orientation: : Oriented to Self, Oriented to Place, Oriented to  Time, Oriented to Situation ?Alcohol / Substance Use: Not Applicable ?Psych Involvement: No (comment) ? ?Admission diagnosis:  Weakness [R53.1] ?Generalized weakness [R53.1] ?Patient Active Problem List  ? Diagnosis Date Noted  ? AKI (acute kidney injury) ([place]) 08/03/2021  ? Myocardial injury 08/03/2021  ? Hypophosphatemia 08/03/2021  ? Obesity (BMI 30-39.9) 08/03/2021  ? Peripheral neuropathy 08/03/2021  ? Weakness 08/02/2021  ? Normocytic anemia 07/29/2021  ? Chronic diastolic CHF (congestive heart failure) ([place]) 07/10/2021  ? Hypokalemia 07/10/2021  ? Hypomagnesemia 07/10/2021  ? Acute congestive heart failure ([place]) 07/09/2021  ? Mixed diabetic hyperlipidemia associated with type 2 diabetes mellitus ([place]) 07/09/2021  ? S/P lumbar fusion 07/04/2021  ? Ovarian mass 03/29/2020  ? S/P hysterectomy 03/29/2020  ? Bilateral occipital neuralgia 11/06/2017  ? Coronary artery  disease involving native coronary artery of native heart 03/02/2016  ? Chest pain with moderate risk for cardiac etiology 01/10/2016  ? Type 2 diabetes mellitus without complication, without long-term current use of insulin ([place]) 12/29/2012  ? CAD  S/P percutaneous coronary angioplasty 09/22/2012  ? History of CVA, 1998  09/22/2012  ? Bradycardia Aug 2013 12/14/2011  ? PAF (paroxysmal atrial fibrillation) ([place]) 12/14/2011  ? Essential hypertension 12/14/2011  ? Hyperlipidemia 12/14/2011  ? ?PCP:  Sandi Mariscal, MD ?Pharmacy:   ?[facility] [place] ?[address] ?[place] [address] ?Phone: [phone] Fax: [phone] ? ? ? ? ?Social Determinants of Health (SDOH) Interventions ?  ? ?Readmission Risk Interventions ?   ? View : No data to display.  ?  ?  ?  ? ? ? ?

## 2021-08-03 NOTE — Assessment & Plan Note (Addendum)
Patient with waxing and waning burning pain in the anterior knees, shins.  This was present prior to surgery, patient feels, but significantly worse since, and now progressing.  No improvement with Voltaren or gabapentin.  No worsened with movement, no clear provoking factors. ? ?Evaluated by Neurosurgery this morning, who performed CT lumbar spine showing progressive subsidence (disk height loss despite fusion) and suspect her burning leg pain may be consistent with radiculitis from this subsidence.   ? ?Unfortunately, surgery to correct this is complicated by recent stent. ? ?- PT eval ?- LSO brace ?- SNF ?- Switch gabapentin to Lyrica given no improvement with gabapentin ? ? ? ? ?

## 2021-08-03 NOTE — Assessment & Plan Note (Signed)
Glucose normal ?-Continue SS corrections ?-Hold metformin ?

## 2021-08-03 NOTE — Assessment & Plan Note (Addendum)
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

## 2021-08-03 NOTE — Care Management Obs Status (Signed)
MEDICARE OBSERVATION STATUS NOTIFICATION ? ? ?Patient Details  ?Name: Debbie [name] ?MRN: [id number] ?Date of Birth: 1946-01-05 ? ? ?Medicare Observation Status Notification Given:  Yes ? ? ? ?Bethena Roys, RN ?08/03/2021, 4:25 PM ?

## 2021-08-04 DIAGNOSIS — M48062 Spinal stenosis, lumbar region with neurogenic claudication: Secondary | ICD-10-CM | POA: Diagnosis not present

## 2021-08-04 DIAGNOSIS — I5A Non-ischemic myocardial injury (non-traumatic): Secondary | ICD-10-CM

## 2021-08-04 DIAGNOSIS — I25110 Atherosclerotic heart disease of native coronary artery with unstable angina pectoris: Secondary | ICD-10-CM | POA: Diagnosis not present

## 2021-08-04 DIAGNOSIS — T84296A Other mechanical complication of internal fixation device of vertebrae, initial encounter: Secondary | ICD-10-CM | POA: Diagnosis not present

## 2021-08-04 DIAGNOSIS — E7800 Pure hypercholesterolemia, unspecified: Secondary | ICD-10-CM | POA: Diagnosis not present

## 2021-08-04 DIAGNOSIS — R531 Weakness: Secondary | ICD-10-CM | POA: Diagnosis not present

## 2021-08-04 DIAGNOSIS — I10 Essential (primary) hypertension: Secondary | ICD-10-CM | POA: Diagnosis not present

## 2021-08-04 LAB — GLUCOSE, CAPILLARY
Glucose-Capillary: 143 mg/dL — ABNORMAL HIGH (ref 70–99)
Glucose-Capillary: 136 mg/dL — ABNORMAL HIGH (ref 70–99)
Glucose-Capillary: 108 mg/dL — ABNORMAL HIGH (ref 70–99)
Glucose-Capillary: 142 mg/dL — ABNORMAL HIGH (ref 70–99)

## 2021-08-04 LAB — BASIC METABOLIC PANEL
Anion gap: 9 (ref 5–15)
BUN: 9 mg/dL (ref 8–23)
CO2: 24 mmol/L (ref 22–32)
Calcium: 9.4 mg/dL (ref 8.9–10.3)
Chloride: 109 mmol/L (ref 98–111)
Creatinine, Ser: 1.2 mg/dL — ABNORMAL HIGH (ref 0.44–1.00)
GFR, Estimated: 47 mL/min — ABNORMAL LOW (ref >60)
Glucose, Bld: 132 mg/dL — ABNORMAL HIGH (ref 70–99)
Potassium: 4.1 mmol/L (ref 3.5–5.1)
Sodium: 142 mmol/L (ref 135–145)

## 2021-08-04 LAB — PHOSPHORUS: Phosphorus: 4.8 mg/dL — ABNORMAL HIGH (ref 2.5–4.6)

## 2021-08-04 MED ORDER — OXYCODONE HCL 5 MG PO TABS
5.0000 mg | ORAL_TABLET | Freq: Four times a day (QID) | ORAL | Status: DC | PRN
  Administered 2021-08-04 – 2021-08-05 (×2): 5 mg via ORAL
  Filled 2021-08-04 (×2): qty 1

## 2021-08-04 MED ORDER — ACETAMINOPHEN 500 MG PO TABS
1000.0000 mg | ORAL_TABLET | Freq: Three times a day (TID) | ORAL | Status: DC
Start: 2021-08-04 — End: 2021-08-05
  Administered 2021-08-04 – 2021-08-05 (×4): 1000 mg via ORAL
  Filled 2021-08-04 (×4): qty 2

## 2021-08-04 NOTE — Progress Notes (Signed)
?Progress Note ? ? ?Patient: Debbie [name] LSL:[id number] DOB: 11-09-1945 DOA: 08/02/2021     1 ?DOS: the patient was seen and examined on 08/04/2021 at 10:04AM and 5:30PM ?  ? ? ? ?Brief hospital course: ?Debbie [name] is a 76 y.o. F with spinal stenosis s/p recent lumbar fusion Mar 2023, subsequent CHF flare Mar 2023 then readmitted second time Apr 2023 for NSTEMI with stent placed to RCA 4/18 (3 days ago), also DM, pAF on Eliquis, HTN, and remote hx CVA who presented with acute weakness. ? ?Discharged from the hospital 1 day PTA.  At home the night of discharge, was in the bathtub and had onset of weakness.  EMS called to get her out of the bathtub, but by morning was no better, so husband tried to get her to the car, couldn't and called EMS again. ? ?In the ER, afebrile, hypertensive, WBC 11K, Cr up to 1.2 from baseline 0.8, UA clean. MRI spine obtained.   ? ?Admitted on fluids.  Neurosurgery consulted. ? ? ? ? ?Assessment and Plan: ?* Spinal stenosis of lumbar region with neurogenic claudication ?Burning improved with Lyrica, but still with excruciating pain from the knees down and in the ankles with any movement, standing or transfers. ? ?- LSO brace when out of bed ?- PT for functionality and mobility, not strengthening ?-Continue Lyrica ?- Acetaminophen and oxycodone ?- Follow up with Dr. Ronnald Ramp after discharge ? ? ? ? ? ?Coronary artery disease involving native coronary artery of native heart ?Recent DES to proximal RCA on 4/18.   ? ?I discussed with on call Cardiology, who confirmed typically patients would need 6 months Plavix post DES. ? ?- We recommend outpatient follow up with Dr. Claiborne Billings, who would be ideal to judge when Plavix could safely be held and under what extenuating circumstances (if any) surgery could occur before 6 months ? ?-Continue aspirin, Zetia, clopidogrel ?-Continue metoprolol, Crestor ? ? ? ? ?Hypophosphatemia ?Resolved with teratment ? ?Type 2 diabetes mellitus without complication,  without long-term current use of insulin ([place]) ?Glucose normal ?-Continue SS corrections ?-Hold metformin ?  ?Essential hypertension ?BP still elevated due to pain ?-Continue amlodipine, furosemide, metoprolol, olmesartan ? ?PAF (paroxysmal atrial fibrillation) ([place]) ?Eliquis could be held for surgery at any time. ?-Continue Eliquis, metoprolol ? ? ? ? ? ? ? ? ? ?Subjective: Her bursning is better, but in transferring to bedside commode, she has severe pain in both legs.   ? ? ? ? ?Physical Exam: ?Vitals:  ? 08/04/21 0650 08/04/21 0733 08/04/21 1115 08/04/21 1650  ?BP: (!) 156/63 (!) 159/60 (!) 158/63 (!) 150/62  ?Pulse:  (!) 54 (!) 58 (!) 54  ?Resp: '20 19 16 17  '$ ?Temp:  97.7 ?F (36.5 ?C) 97.7 ?F (36.5 ?C) 97.6 ?F (36.4 ?C)  ?TempSrc:  Oral Oral Oral  ?SpO2:  97% 97% 97%  ?Weight:      ?Height:      ? ?Adult female, sitting up in bed, no acute distress. ?RRR no murmurs, no LE edema ?Respiratory rate normal, no rales or wheezing ?Strength 4/5 and symmetric in LE, severe pain with hip flexion.  Extension at hip and knee, as well as plantar and dorsiflecxion of the ankle is normal.  UE strength normal. ? ?Data Reviewed: ?Discussed with Neurosurgery. ?BMP reviewed, phosphorus reviewed.  X-ray reports reviewed, CT reports reviewed. ?Approximately 50 minutes was spent with the patient and her husband discussing her symptoms and treatment, discussing case with Neurosurgery, nursing and therapy, and coordinating  post-discharge care with TOC. ? ? ? ?Family Communication: Husband ? ? ? ?Disposition: ?Status is: Inpatient ? ? ? ? ? ? ? ? ?Author: ?Edwin Dada, MD ?08/04/2021 5:55 PM ? ?For on call review [email or web address].  ? ? ?

## 2021-08-04 NOTE — Progress Notes (Addendum)
Providing Compassionate, Quality Care - Together  Brief hospital course: Ms. Noth is one month status post L3-4, L4-5 PLIF by Dr. Yetta Barre. An endplate fracture of L5, with subsidence of the grafts into the vertebral body was discovered during her hospital admission for chest pain and shortness of breath on 07/09/2021. This was being monitored by Dr. Yetta Barre. The patient was admitted again on 07/29/2021 with chest pain. She underwent stenting of the proximal RCA lesion on 07/31/2021 by Dr. Tresa Endo. She was discharged on 08/01/2021 on triple therapy for one month, followed by Plavix and Eliquis indefinitely. Mrs. Dupuy was readmitted on 08/02/2021 due to generalized weakness. Lumbar CT imaging was repeated 08/02/2021 demonstrating worsening subsidence at the L4-5 level, with vacuum phenomenon. There is probable lucency around the left L5 screw and, to a lesser degree, the right L5 screw. LSO corset ordered for OOB by Dr. Yetta Barre.  Subjective: Patient reports she's doing "ok." She continues to complain of fatigue and intermittent burning pain into her anterior knees and shins. Husband has questions regarding LSO corset at the bedside.  Objective: Vital signs in last 24 hours: Temp:  [97.5 F (36.4 C)-98.7 F (37.1 C)] 97.7 F (36.5 C) (04/22 1115) Pulse Rate:  [49-66] 58 (04/22 1115) Resp:  [15-21] 16 (04/22 1115) BP: (97-165)/(49-70) 158/63 (04/22 1115) SpO2:  [94 %-100 %] 97 % (04/22 1115) Weight:  [85.7 kg] 85.7 kg (04/22 0420)  Intake/Output from previous day: 04/21 0701 - 04/22 0700 In: -  Out: 450 [Urine:450] Intake/Output this shift: Total I/O In: 360 [P.O.:360] Out: -   Alert and oriented x 4 PERRLA CN II-XII grossly intact MAE, good dorsiflexion, plantarflexion bilaterally Weakness in bilateral hip flexors, worse on the right     Lab Results: Recent Labs    08/02/21 2112 08/03/21 0341  WBC 11.1* 7.2  HGB 11.2* 10.2*  HCT 35.9* 31.9*  PLT 294 251   BMET Recent Labs     08/03/21 0341 08/04/21 0224  NA 142 142  K 3.5 4.1  CL 112* 109  CO2 23 24  GLUCOSE 112* 132*  BUN 6* 9  CREATININE 0.88 1.20*  CALCIUM 9.5 9.4    Studies/Results: DG Lumbar Spine Complete  Result Date: 08/02/2021 CLINICAL DATA:  Fall, back pain EXAM: LUMBAR SPINE - COMPLETE 4+ VIEW COMPARISON:  07/17/2021 FINDINGS: L3-L5 lumbar fusion with instrumentation has been performed. Stable grade 1 anterolisthesis L4-5. No acute fracture. Vertebral body height is preserved. There is preserved intervertebral disc heights at the remaining levels within the lumbar spine. The paraspinal soft tissues are unremarkable. Vascular calcifications are noted within the abdominal aorta. IMPRESSION: No acute fracture or traumatic listhesis of the lumbar spine. L3-L5 lumbar fusion with instrumentation. Electronically Signed   By: Helyn Numbers M.D.   On: 08/02/2021 19:55   DG Tibia/Fibula Left  Result Date: 08/02/2021 CLINICAL DATA:  Left ankle pain following fall EXAM: LEFT TIBIA AND FIBULA - 2 VIEW COMPARISON:  None. FINDINGS: There is no evidence of fracture or other focal bone lesions. Soft tissues are unremarkable. IMPRESSION: Negative. Electronically Signed   By: Helyn Numbers M.D.   On: 08/02/2021 19:56   DG Ankle Complete Right  Result Date: 08/02/2021 CLINICAL DATA:  Fall, right ankle pain fall are fall pain there is EXAM: RIGHT ANKLE - COMPLETE 3+ VIEW COMPARISON:  None. FINDINGS: There is mild soft tissue swelling dorsal to the kidney a forms. There is a small ossific fracture fragment arising from the dorsal aspect of the anterior process  of the talus, best seen on sagittal view which may represent a small capsular avulsive fracture. No other fracture identified. Normal overall alignment. Mild soft tissue swelling superficial to the lateral malleolus. No ankle effusion. IMPRESSION: Soft tissue swelling dorsal to the lateral malleolus and midfoot. Possible small avulsive fracture fragment from the  dorsal aspect of the anterior process of the talus. Correlation for point tenderness would be helpful in determining acuity. Electronically Signed   By: Helyn Numbers M.D.   On: 08/02/2021 19:51   CT LUMBAR SPINE WO CONTRAST  Result Date: 08/03/2021 CLINICAL DATA:  Lumbar radiculopathy, symptoms persist with > 6 wks treatment EXAM: CT LUMBAR SPINE WITHOUT CONTRAST TECHNIQUE: Multidetector CT imaging of the lumbar spine was performed without intravenous contrast administration. Multiplanar CT image reconstructions were also generated. RADIATION DOSE REDUCTION: This exam was performed according to the departmental dose-optimization program which includes automated exposure control, adjustment of the mA and/or kV according to patient size and/or use of iterative reconstruction technique. COMPARISON:  MRI lumbar spine 08/03/2021, CT 07/09/2021 FINDINGS: Segmentation: Numbering consistent with recent MRI. The lowest well-formed disc space is designated as L5-S1. Alignment: Mild residual anterolisthesis at L4-L5. Vertebrae: Prior posterior decompression with posterior and interbody fusion at L3-L4 and L4-L5. No solid arthrodesis at this time. Again seen is interbody cage subsidence into the L5 vertebral body, increased since the prior CT by approximately 4 mm, and with increased surrounding gas. Intact pedicle screws and posterior fusion rods. Paraspinal and other soft tissues: Aortoiliac atherosclerosis. Disc levels: Limited assessment due to streak artifact and postoperative soft tissue changes. Disc levels are best assessed on separately dictated same-day MRI. Mild bilateral neural foraminal stenosis at L3-L4 and L4-L5. Patent spinal canal. IMPRESSION: Postsurgical changes of PLIF at L3-L4 and L4-L5. Increased subsidence of the L4-L5 intervertebral disc spacer comparison to prior CT on 07/09/2021, with depression of the L5 superior endplate. Bilateral neural foraminal stenosis at L3-L4 and L4-L5 and right-sided  disc protrusion at L5-S1, best assessed on separately dictated MRI. Electronically Signed   By: Caprice Renshaw M.D.   On: 08/03/2021 09:09   MR Lumbar Spine W Wo Contrast  Result Date: 08/03/2021 CLINICAL DATA:  Weakness.  Recent lumbar fusion. EXAM: MRI LUMBAR SPINE WITHOUT AND WITH CONTRAST TECHNIQUE: Multiplanar and multiecho pulse sequences of the lumbar spine were obtained without and with intravenous contrast. CONTRAST:  8mL GADAVIST GADOBUTROL 1 MMOL/ML IV SOLN COMPARISON:  03/26/2021 FINDINGS: Segmentation:  Standard Alignment:  Grade 1 anterolisthesis at L4-5 Vertebrae: L3-5 PLIF. No acute abnormality. There is disc spacer subsidence at L4-5 with depression of the L5 superior endplate. Conus medullaris and cauda equina: Conus extends to the L1 level. Conus and cauda equina appear normal. Paraspinal and other soft tissues: Negative. Disc levels: L1-L2: Normal disc space and facet joints. No spinal canal stenosis. No neural foraminal stenosis. L2-L3: Normal disc space and facet joints. No spinal canal stenosis. No neural foraminal stenosis. L3-L4: PLIF. No spinal canal stenosis. Improved patency of the neural foramina with mild residual bilateral neural foraminal stenosis. L4-L5: PLIF. No spinal canal stenosis. Slightly worsened mild bilateral neural foraminal stenosis. L5-S1: New small right subarticular disc protrusion with annular fissure in close proximity to the exiting right L5 nerve root. No spinal canal stenosis. No neural foraminal stenosis. Visualized sacrum: Normal. IMPRESSION: 1. New L5-S1 small right subarticular disc protrusion with annular fissure in close proximity to the exiting right L5 nerve root. Correlate for right L5 radiculopathy. 2. L3-5 PLIF with improved patency of the  L3-4 neural foramina and no spinal canal stenosis. 3. Disc spacer subsidence at L4-5 with depression of the L5 superior endplate. Slightly worsened mild bilateral neural foraminal stenosis at L4-5. Electronically  Signed   By: Deatra Robinson M.D.   On: 08/03/2021 04:00   DG Chest Port 1 View  Result Date: 08/02/2021 CLINICAL DATA:  Fall, chest pain EXAM: PORTABLE CHEST 1 VIEW COMPARISON:  None. FINDINGS: Lung volumes are small. Interstitial opacities within the left lung base are chronic in nature in keeping with mild subpleural fibrosis better seen on CT examination of 07/29/2021. No superimposed confluent pulmonary infiltrate. No pneumothorax or pleural effusion. Cardiac size is mildly enlarged, unchanged. No acute bone abnormality. IMPRESSION: No radiographic evidence of acute cardiopulmonary disease. Stable cardiomegaly. Electronically Signed   By: Helyn Numbers M.D.   On: 08/02/2021 19:53   DG Knee Complete 4 Views Right  Result Date: 08/02/2021 CLINICAL DATA:  Right knee pain following fall. EXAM: RIGHT KNEE - COMPLETE 4+ VIEW COMPARISON:  None. FINDINGS: Soft tissue swelling about the anterior superior aspect of the tibia without associated fracture or radiopaque foreign body. No definite knee joint effusion. Joint spaces appear preserved however a small amount of chondrocalcinosis is seen within both the medial and lateral joint spaces. IMPRESSION: 1. Soft tissue swelling about the anterior aspect the knee without associated fracture or dislocation. 2. 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 Electronically Signed   By: Simonne Come M.D.   On: 08/02/2021 16:46    Assessment/Plan: Patient with complicated situation given the need to remain on Plavix at present. Situation was discussed with Dr. Maryfrances Bunnell. Surgical options can be further addressed following Mrs. Lovingood's follow up with Dr. Tresa Endo. Dr. Maryfrances Bunnell reports the patient could come off of Eliquis temporarily for surgery, but will likely need to remain on Plavix for at least six months. If Mrs. Gabay's situation worsens in the interim, discussions between Dr. Tresa Endo and Dr. Yetta Barre will need to take place regarding the patient's options for  lumbar revision surgery.   LOS: 1 day   -Plan is to discharge to SNF. -All OOB mobilization should be done with the LSO corset. Therapies do not need to work on strength training at this time. Focus should be strictly on maintaining mobility and functional status. -Husband and patient given brief education on donning the LSO corset.  Val Eagle, DNP, AGNP-C Nurse Practitioner  [facility] [address], [address], [place], [place] [address] P: [phone]    F: [phone]  08/04/2021, 12:12 PM

## 2021-08-04 NOTE — TOC Progression Note (Signed)
Transition of Care (TOC) - Progression Note  ? ? ?Patient Details  ?Name: Debbie [name] ?MRN: [id number] ?Date of Birth: 1946/01/06 ? ?Transition of Care (TOC) CM/SW Contact  ?Bary Castilla, LCSW ?Phone [phone] ?08/04/2021, 10:26 AM ? ?Clinical Narrative:    ? ?CSW checked pt's authorization and it is still pending. CSW will check later today. ? ?TOC team will continue to assist with discharge planning needs.  ? ?Expected Discharge Plan: [place] ?Barriers to Discharge: Continued Medical Work up ? ?Expected Discharge Plan and Services ?Expected Discharge Plan: [place] ?In-house Referral: Clinical Social Work ?  ?  ?Living arrangements for the past 2 months: [place] ?                ?  ?  ?  ?  ?  ?  ?  ?  ?  ?  ? ? ?Social Determinants of Health (SDOH) Interventions ?  ? ?Readmission Risk Interventions ?   ? View : No data to display.  ?  ?  ?  ? ? ?

## 2021-08-04 NOTE — TOC Progression Note (Signed)
Transition of Care (TOC) - Progression Note  ? ? ?Patient Details  ?Name: Debbie [name] ?MRN: [id number] ?Date of Birth: [birth year] ? ?Transition of Care (TOC) CM/SW Contact  ?Bary Castilla, LCSW ?Phone Number: [phone] ?08/04/2021, 3:31 PM ? ?Clinical Narrative:    ? ?CSW called [facility] to speak with April and had to leave message, awaiting a call back. ? ?TOC team will continue to assist with discharge planning needs.  ? ?Expected Discharge Plan: [place] ?Barriers to Discharge: Continued Medical Work up ? ?Expected Discharge Plan and Services ?Expected Discharge Plan: [place] ?In-house Referral: Clinical Social Work ?  ?  ?Living arrangements for the past 2 months: [place] ?                ?  ?  ?  ?  ?  ?  ?  ?  ?  ?  ? ? ?Social Determinants of Health (SDOH) Interventions ?  ? ?Readmission Risk Interventions ?   ? View : No data to display.  ?  ?  ?  ? ? ?

## 2021-08-04 NOTE — TOC Progression Note (Signed)
Transition of Care (TOC) - Progression Note  ? ? ?Patient Details  ?Name: Debbie [name] ?MRN: [id number] ?Date of Birth: 02/11/1946 ? ?Transition of Care (TOC) CM/SW Contact  ?Bary Castilla, LCSW ?Phone Number: [phone] ?08/04/2021, 3:00 PM ? ?Clinical Narrative:    ?Pt's authorization is back, CSW reached out to RN and MD to ascertain DC readiness. ? Everlene Balls #[id number] ?Health Plan #- [id number] ?Authorization dates: 4/21-4/25 ?Fax Number: [phone]   ? ?TOC team will continue to assist with discharge planning needs.  ? ?Expected Discharge Plan: [place] ?Barriers to Discharge: Continued Medical Work up ? ?Expected Discharge Plan and Services ?Expected Discharge Plan: [place] ?In-house Referral: Clinical Social Work ?  ?  ?Living arrangements for the past 2 months: [place] ?                ?  ?  ?  ?  ?  ?  ?  ?  ?  ?  ? ? ?Social Determinants of Health (SDOH) Interventions ?  ? ?Readmission Risk Interventions ?   ? View : No data to display.  ?  ?  ?  ? ? ?

## 2021-08-05 ENCOUNTER — Inpatient Hospital Stay (HOSPITAL_COMMUNITY): Payer: Medicare Other

## 2021-08-05 DIAGNOSIS — R531 Weakness: Secondary | ICD-10-CM | POA: Diagnosis not present

## 2021-08-05 DIAGNOSIS — I2510 Atherosclerotic heart disease of native coronary artery without angina pectoris: Secondary | ICD-10-CM | POA: Diagnosis not present

## 2021-08-05 DIAGNOSIS — I10 Essential (primary) hypertension: Secondary | ICD-10-CM | POA: Diagnosis not present

## 2021-08-05 DIAGNOSIS — M48062 Spinal stenosis, lumbar region with neurogenic claudication: Secondary | ICD-10-CM | POA: Diagnosis not present

## 2021-08-05 DIAGNOSIS — T84296A Other mechanical complication of internal fixation device of vertebrae, initial encounter: Secondary | ICD-10-CM | POA: Diagnosis not present

## 2021-08-05 LAB — GLUCOSE, CAPILLARY
Glucose-Capillary: 150 mg/dL — ABNORMAL HIGH (ref 70–99)
Glucose-Capillary: 152 mg/dL — ABNORMAL HIGH (ref 70–99)

## 2021-08-05 IMAGING — DX DG ANKLE COMPLETE 3+V*L*
1 series · 3 of 3 positions shown · non-contrast
Comparison: None.

CLINICAL DATA: left ankle pain and bruising status post fall
several days ago.

EXAM:
LEFT ANKLE COMPLETE - 3 VIEW

[Series 1: ankle · 0.14mm/px · 3 of 3 slices shown]
[im 1/3]
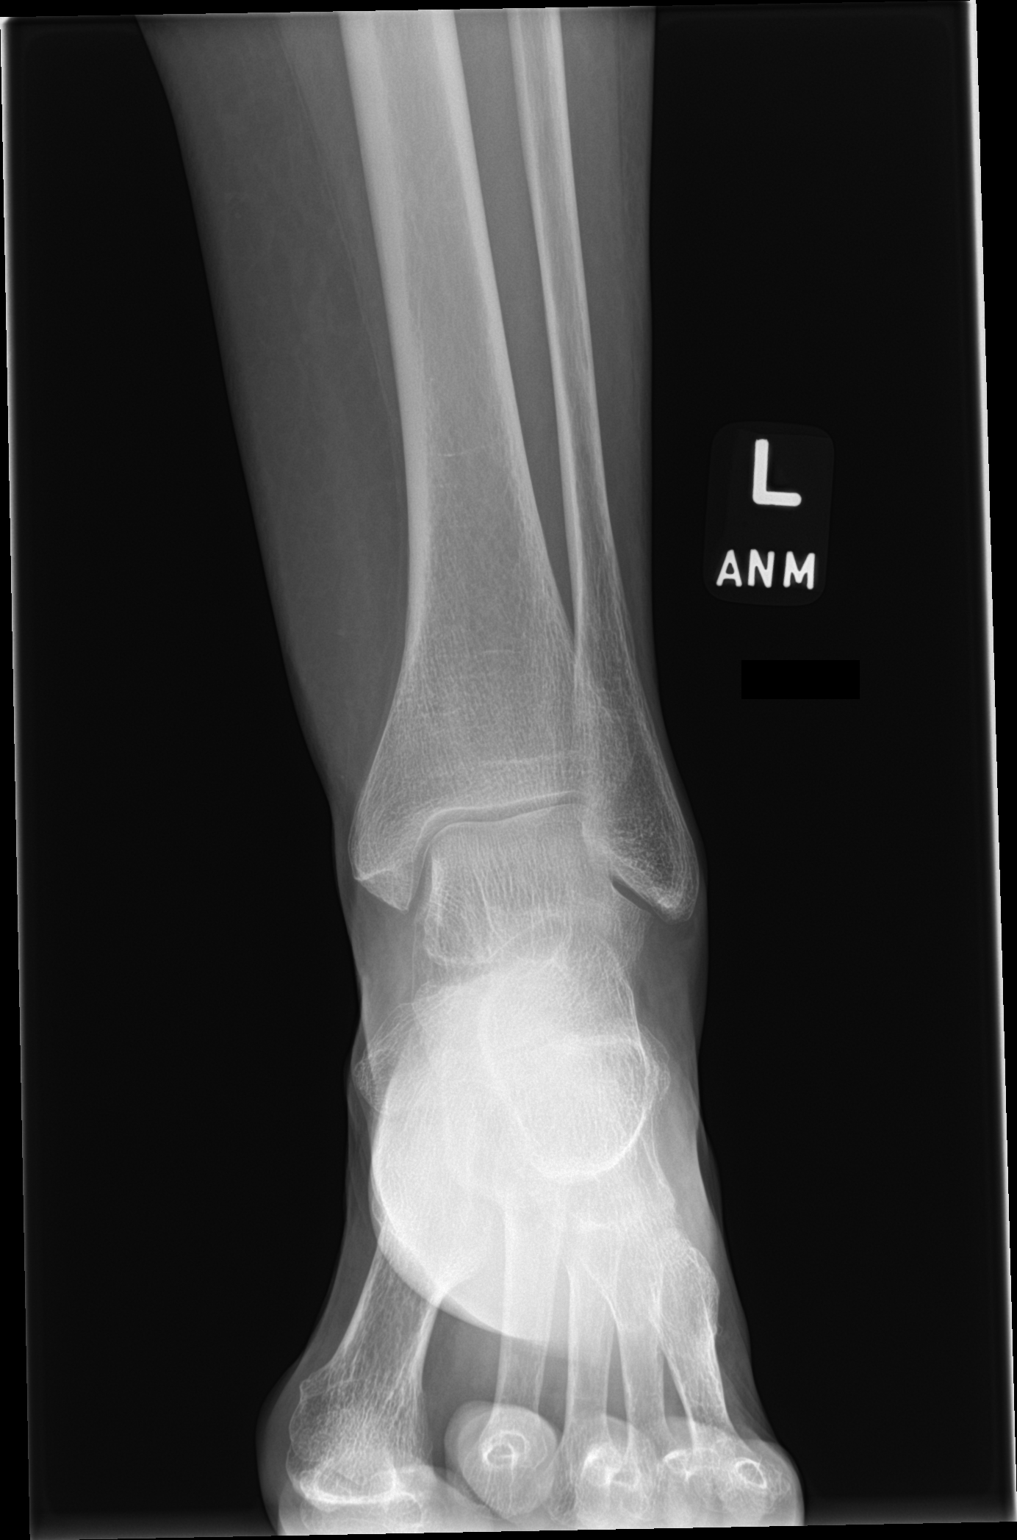
[im 2/3]
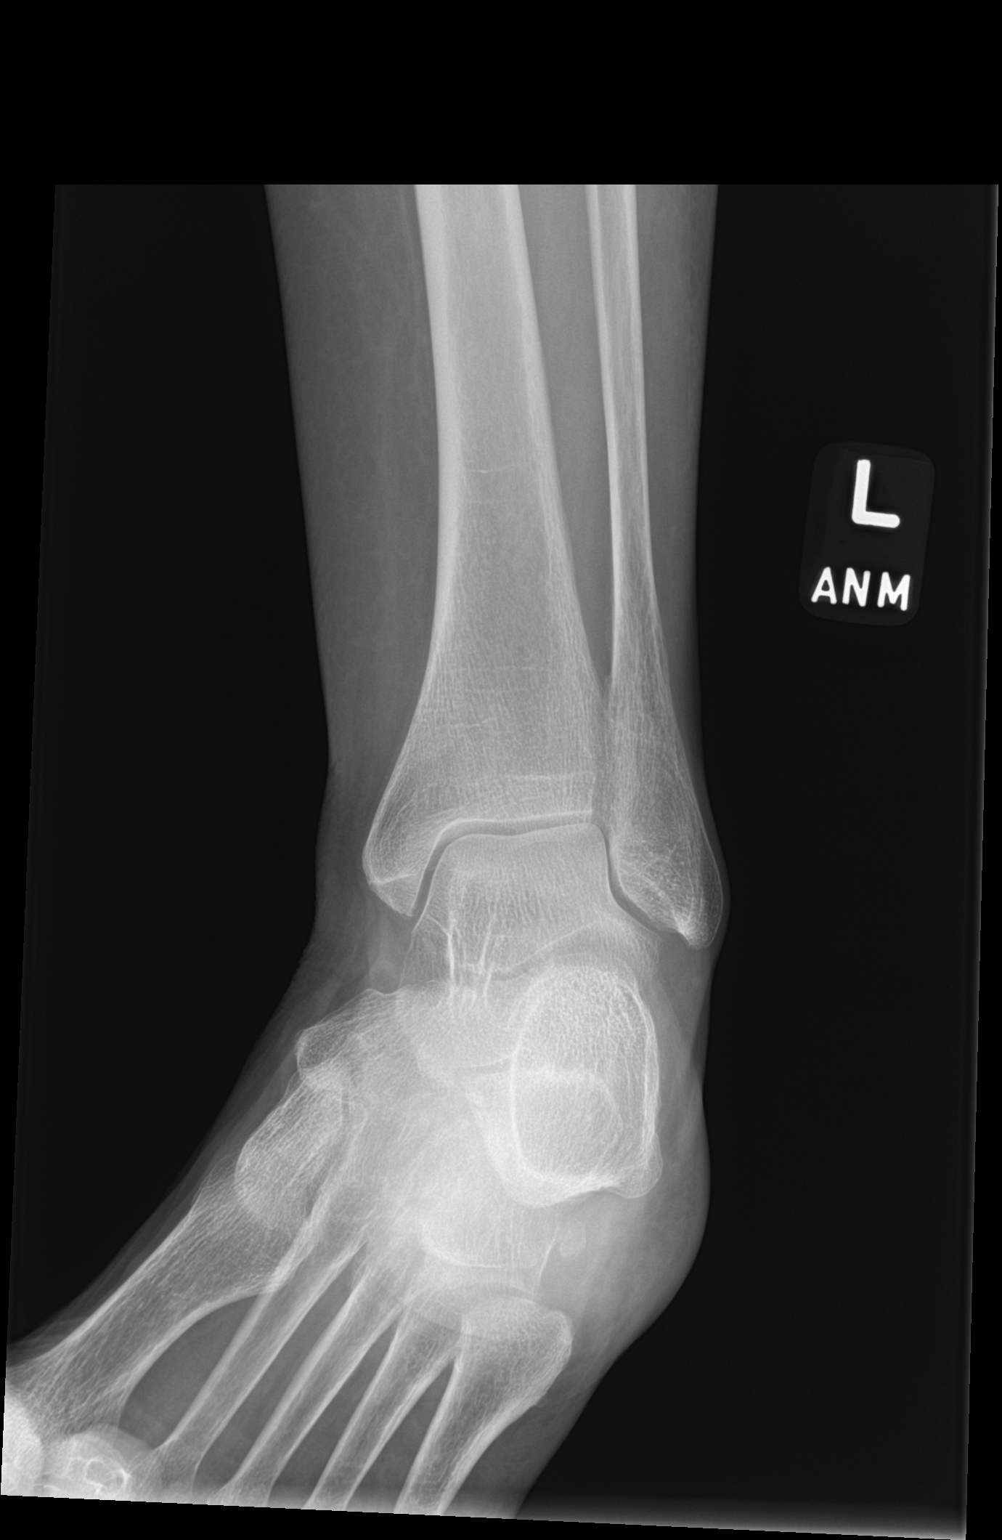
[im 3/3]
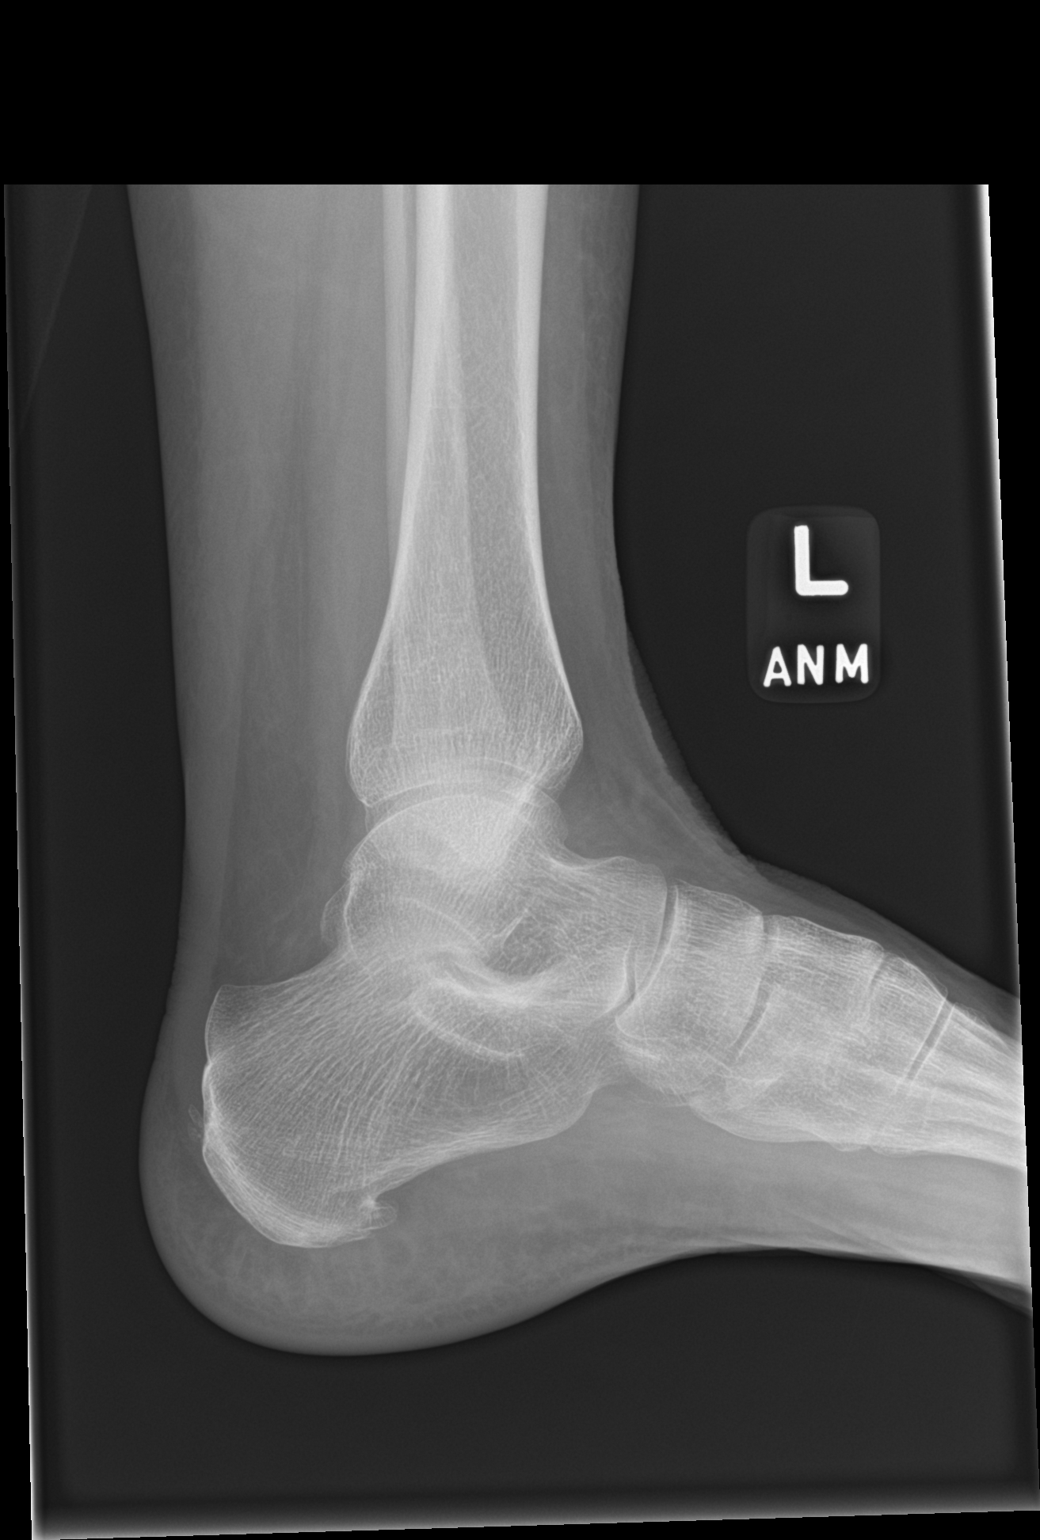

[3 of 3 positions shown; findings below may reference images not displayed]

FINDINGS: There is no evidence of fracture, dislocation, or joint effusion.
Minimal plantar calcaneal spur is noted. Soft tissues are
unremarkable.
IMPRESSION: No acute fracture or dislocation.

## 2021-08-05 MED ORDER — POLYETHYLENE GLYCOL 3350 17 G PO PACK: 17.0000 g | PACK | Freq: Every day | ORAL | 0 refills | Status: DC

## 2021-08-05 MED ORDER — PREGABALIN 25 MG PO CAPS: 25.0000 mg | ORAL_CAPSULE | Freq: Two times a day (BID) | ORAL | Status: DC

## 2021-08-05 MED ORDER — OXYCODONE HCL 5 MG PO TABS: 5.0000 mg | ORAL_TABLET | Freq: Four times a day (QID) | ORAL | 0 refills | Status: DC | PRN

## 2021-08-05 NOTE — Discharge Summary (Signed)
?Physician Discharge Summary ?  ?Patient: Debbie [name] MRN: [id number] DOB: 1945-06-21  ?Admit date:     08/02/2021  ?Discharge date: 08/05/21  ?Discharge Physician: Debbie [name]  ? ?PCP: Debbie Mariscal, MD  ? ? ? ?Recommendations at discharge:  ?[facility]: Please schedule follow up appointment with Cardiology Debbie [name] in 1-2 weeks ?[facility]: Please have patient follow up with Debbie [name] as scheduled below ?Debbie [name]: Patient may need revision of spine surgery, please comment when would be earliest reasonable time to hold Plavix ? ? ? ? ? ?Discharge Diagnoses: ?Principal Problem: ?  Ambulatory dysfunction due to radiculitis due to subsidence of lumbar fusion hardware ?Active Problems: ?  Spinal stenosis of lumbar region with neurogenic claudication ?  Coronary artery disease involving native coronary artery of native heart ?  Stage 3a chronic kidney disease (CKD) ([place]) ?  PAF (paroxysmal atrial fibrillation) ([place]) ?  Essential hypertension ?  Hyperlipidemia ?  Type 2 diabetes mellitus without complication, without long-term current use of insulin ([place]) ?  Normocytic anemia ?  Myocardial injury ?  Hypophosphatemia ?  Obesity (BMI 30-39.9) ? ? ? ? ? ? ?Hospital Course: ?Debbie [name] is a 76 y.o. F with spinal stenosis s/p recent lumbar fusion Mar 2023, subsequent CHF flare Mar 2023 then readmitted second time Apr 2023 for NSTEMI with stent placed to RCA 4/18 (3 days ago), also DM, pAF on Eliquis, HTN, and remote hx CVA who presented with acute weakness. ? ?Discharged from the hospital 1 day PTA.  At home the night of discharge, started to develop pain in legs so severe, her legs gave out.   EMS called to get her out of the bathtub, but by morning was no better, so husband tried to get her to the car, couldn't due to pain and called EMS again. ? ? ? ? ? ? ? ?Assessment and Plan: ?* Ambulatory dysfunction due to radiculitis due to subsidence of lumbar hardware ?Spinal stenosis of lumbar region with neurogenic  claudication ?Patient with waxing and waning burning pain in the anterior knees, shins.  This was present prior to surgery, patient feels, but significantly worse since, and now progressing.  No improvement with Voltaren or gabapentin.  Worse with movement. ? ?Evaluated by Neurosurgery who repeated CT lumbar spine showing progressive subsidence (disk height loss despite fusion) and suspect her burning leg pain may be consistent with radiculitis from this subsidence.   ? ?Treatment would be surgery, but this is precluded by recent stent requiring Plavix. ? ?- LSO brace at all times when out of bed ?- Improvement in burning with Lyrica and with pain with ambulation with oxycodone/acetaminophen ?- PT for mobility and function, not for strengthening ? ? ? ?Coronary artery disease involving native coronary artery of native heart ?Recent DES to proximal RCA on 4/18.   ?- We recommend outpatient follow up with Debbie [name], who would be ideal to judge when Plavix could safely be held and under what extenuating circumstances (if any) surgery could occur before 6 months ? ?Stage 3a chronic kidney disease (CKD) ([place]) ?AKI ruled out, CKD IIIa ?  ? ?  ? ? ? ? ?  ? ? ? ? ? ?[facility] Controlled Substances Registry was reviewed for this patient prior to discharge.  ? ?Consultants: Neurosurgery ?Procedures performed: CT spine  ?Disposition: Skilled nursing facility ?Diet recommendation: Cardiac ? ? ?DISCHARGE MEDICATION: ?Allergies as of 08/05/2021   ?No Known Allergies ?  ? ?  ?Medication List  ?  ? ?  STOP taking these medications   ? ?gabapentin 300 MG capsule ?Commonly known as: NEURONTIN ?  ?HYDROcodone-acetaminophen 5-325 MG tablet ?Commonly known as: NORCO/VICODIN ?  ?methocarbamol 500 MG tablet ?Commonly known as: Robaxin ?  ? ?  ? ?TAKE these medications   ? ?acetaminophen 500 MG tablet ?Commonly known as: TYLENOL ?Take 2 tablets (1,000 mg total) by mouth every 8 (eight) hours as needed. ?  ?amLODipine 10 MG  tablet ?Commonly known as: NORVASC ?Take 1 tablet (10 mg total) by mouth daily. ?  ?apixaban 5 MG Tabs tablet ?Commonly known as: ELIQUIS ?Take 1 tablet (5 mg total) by mouth 2 (two) times daily. ?  ?aspirin 81 MG chewable tablet ?Chew 1 tablet (81 mg total) by mouth daily. ?  ?Centrum Silver Adult 50+ Tabs ?Take 1 tablet by mouth daily. ?  ?clopidogrel 75 MG tablet ?Commonly known as: PLAVIX ?Take 1 tablet (75 mg total) by mouth daily with breakfast. ?  ?ezetimibe 10 MG tablet ?Commonly known as: ZETIA ?Take 1 tablet by mouth once daily ?  ?furosemide 20 MG tablet ?Commonly known as: LASIX ?Take 1 tablet by mouth once daily ?  ?metFORMIN 500 MG tablet ?Commonly known as: GLUCOPHAGE ?Take 1 tablet (500 mg total) by mouth 2 (two) times daily. ?  ?metoprolol succinate 100 MG 24 hr tablet ?Commonly known as: TOPROL-XL ?Take 1 tablet (100 mg total) by mouth daily. Take with or immediately following a meal. ?  ?Myrbetriq 50 MG Tb24 tablet ?Generic drug: mirabegron ER ?Take 50 mg by mouth daily. ?  ?nitroGLYCERIN 0.4 MG SL tablet ?Commonly known as: NITROSTAT ?Place 1 tablet (0.4 mg total) under the tongue every 5 (five) minutes as needed for chest pain. ?  ?olmesartan 20 MG tablet ?Commonly known as: BENICAR ?Take 1 tablet (20 mg total) by mouth daily. ?  ?oxyCODONE 5 MG immediate release tablet ?Commonly known as: Oxy IR/ROXICODONE ?Take 1 tablet (5 mg total) by mouth every 6 (six) hours as needed for breakthrough pain. ?  ?polyethylene glycol 17 g packet ?Commonly known as: MIRALAX / GLYCOLAX ?Take 17 g by mouth daily. ?  ?pregabalin 25 MG capsule ?Commonly known as: LYRICA ?Take 1 capsule (25 mg total) by mouth 2 (two) times daily. ?  ?rosuvastatin 5 MG tablet ?Commonly known as: CRESTOR ?Take 5 mg by mouth daily. ?  ? ?  ? ?  ?  ? ? ?  ?Discharge Care Instructions  ?(From admission, onward)  ?  ? ? ?  ? ?  Start     Ordered  ? 08/05/21 0000  Discharge wound care:       ?Comments: As previous  ? 08/05/21 0940  ? ?  ?   ? ?  ? ? Follow-up Information   ? ? Debbie Moore, MD. Schedule an appointment as soon as possible for a visit in 2 week(s).   ?Specialty: Neurosurgery ?Contact information: ?[address] ?[address] ?[place] [address] ?[phone] ? ? ?  ?  ? ? Troy Sine, MD. Schedule an appointment as soon as possible for a visit in 2 week(s).   ?Specialty: Cardiology ?Contact information: ?[address] ?[address] ?[place] [address] ?[phone] ? ? ?  ?  ? ?  ?  ? ?  ? ? ?Discharge Instructions   ? ? Discharge instructions   Complete by: As directed ?  ? From Dr. Loleta Books: ?You were admitted with severe leg pain. ?We suspect this is from subsidence (settling) of your  hardware, thus pinching the nerves again. ? ?For the constant burning pain, take Lyrica (pregabalin) 25 mg twice daily ? ?For the severe pain with walking, take acetaminophen 1000 mg (two tabs) plus oxycodone 5 mg  ?Remember, oxycodone takes about an hour to work ?And it lasts for abut 4-6 hours for most people ?Start out slow, only once or twice daily, to see how it affects you ?You can take it more frequently, if you find this helps you be more mobile ?Oxycodone will make you constipated, so take a laxative (start with Miralax daily, add senna-docusate if needed) ? ? ?Wear the LSO corset brace at all times when out of bed ?Do therapy to maintain your mobility and function, not for strengthening right now ? ?Go see Debbie [name] in 1-2 weeks to ask when is the soonest you could stop your Plavix ? ?Go see Debbie [name] as instructed  ? Discharge wound care:   Complete by: As directed ?  ? As previous  ? Increase activity slowly   Complete by: As directed ?  ? ?  ? ? ?Discharge Exam: ?Filed Weights  ? 08/03/21 9622 08/04/21 0420 08/05/21 0502  ?Weight: 84.5 kg 85.7 kg 82.7 kg  ? ? ?General: Pt is alert, awake, not in acute distress ?Cardiovascular: RRR, nl S1-S2, no murmurs appreciated.   No LE edema.   ?Respiratory: Normal respiratory rate and  rhythm.  CTAB without rales or wheezes. ?Abdominal: Abdomen soft and non-tender.  No distension or HSM.   ?Neuro/Psych: Strength symmetric in upper and lower extremities, but still 3-4/5 strength in lower extremities.  J

## 2021-08-05 NOTE — TOC Progression Note (Signed)
Transition of Care (TOC) - Progression Note  ? ? ?Patient Details  ?Name: Debbie [name] ?MRN: [id number] ?Date of Birth: [birth year] ? ?Transition of Care (TOC) CM/SW Contact  ?Bary Castilla, LCSW ?Phone Number:[phone] ?08/05/2021, 8:32 AM ? ?Clinical Narrative:    ? ?CSW called [facility] again and spoke with April. She stated that they are able to accept pt today and they are requesting an early DC. CSW alerted MD and RN. ? ?TOC team will continue to assist with discharge planning needs.  ? ?Expected Discharge Plan: [place] ?Barriers to Discharge: Continued Medical Work up ? ?Expected Discharge Plan and Services ?Expected Discharge Plan: [place] ?In-house Referral: Clinical Social Work ?  ?  ?Living arrangements for the past 2 months: [place] ?                ?  ?  ?  ?  ?  ?  ?  ?  ?  ?  ? ? ?Social Determinants of Health (SDOH) Interventions ?  ? ?Readmission Risk Interventions ?   ? View : No data to display.  ?  ?  ?  ? ? ?

## 2021-08-05 NOTE — TOC Transition Note (Signed)
Transition of Care (TOC) - CM/SW Discharge Note ? ? ?Patient Details  ?Name: Blondell Laperle ?MRN: [id number] ?Date of Birth: Aug 18, 1945 ? ?Transition of Care (TOC) CM/SW Contact:  ?Bary Castilla, LCSW ?Phone [phone] ?08/05/2021, 10:27 AM ? ? ?Clinical Narrative:    ? ?Patient will DC to:?[facility] ?Anticipated DC date:?08/05/2021 ?Family notified:?William ?Transport HB:[id number] ?  ?Per MD patient ready for DC to [facility]. RN, patient, patient's family, and facility notified of DC. Discharge Summary sent to facility. RN given number for report  [phone] ask for [facility] room 207. DC packet on chart. Ambulance transport requested for patient.  ? ?CSW signing off. ?  ?Vallery Ridge, LCSW ?[phone]  ? ?Final next level of care: [place] ?Barriers to Discharge: Barriers Resolved ? ? ?Patient Goals and CMS Choice ?Patient states their goals for this hospitalization and ongoing recovery are:: SNF ?CMS Medicare.gov Compare Post Acute Care list provided to:: Patient ?Choice offered to / list presented to : Patient ? ?Discharge Placement ?  ?           ?Patient chooses bed at: [facility] [place] ?Patient to be transferred to facility by: [facility] ?Name of family member notified: Gwyndolyn Saxon ?Patient and family notified of of transfer: 08/05/21 ? ?Discharge Plan and Services ?In-house Referral: Clinical Social Work ?  ?           ?  ?  ?  ?  ?  ?  ?  ?  ?  ?  ? ?Social Determinants of Health (SDOH) Interventions ?  ? ? ?Readmission Risk Interventions ?   ? View : No data to display.  ?  ?  ?  ? ? ? ? ? ?

## 2021-09-13 ENCOUNTER — Telehealth: Payer: Self-pay | Admitting: Family

## 2021-09-13 NOTE — Telephone Encounter (Signed)
[facility] Physical Therapist call to report on fell and hit head due to complications from back surgery. Denies any visual or cognitive changes. Oxygen dropped from 94 to 87% which is new. Usually no issues with oxygen. Lungs was clear. Heartbeat off and on regular and irregular. One episode that lasted over a min. Please call Physical Therapist Herbert Deaner at [phone]

## 2021-09-13 NOTE — Telephone Encounter (Signed)
Spoke with Herbert Deaner, Physical Therapist regarding pt having a recent fall where she did hit her head. Clair Gulling did encourage pt to be checked out at the hospital but pt refused. Clair Gulling reports a small abnormality cognitively, which was that she usually has to work search for a few seconds at time but a few times today she never figured out the word. Clair Gulling reports that she does have a lump on the back of her head but there is no visual bruising. Per Clair Gulling, they let the surgeon's office know about the fall but was not told to go to ED.  I tried to call pt's husband, Gwyndolyn Saxon but call went to voicemail, left a message that pt should be evaluated at the ED due to pt being on triple therapy.

## 2021-09-14 ENCOUNTER — Other Ambulatory Visit: Payer: Self-pay | Admitting: Student

## 2021-09-14 ENCOUNTER — Emergency Department (HOSPITAL_COMMUNITY): Payer: Medicare PPO

## 2021-09-14 ENCOUNTER — Other Ambulatory Visit: Payer: Self-pay

## 2021-09-14 ENCOUNTER — Emergency Department (HOSPITAL_COMMUNITY)
Admission: EM | Admit: 2021-09-14 | Discharge: 2021-09-14 | Disposition: A | Discharge status: Home / Self Care | Payer: Medicare PPO | Attending: Emergency Medicine | Admitting: Emergency Medicine

## 2021-09-14 ENCOUNTER — Encounter (HOSPITAL_COMMUNITY): Payer: Self-pay

## 2021-09-14 ENCOUNTER — Other Ambulatory Visit (HOSPITAL_COMMUNITY): Payer: Self-pay | Admitting: Student

## 2021-09-14 DIAGNOSIS — E876 Hypokalemia: Secondary | ICD-10-CM

## 2021-09-14 DIAGNOSIS — W1839XA Other fall on same level, initial encounter: Secondary | ICD-10-CM | POA: Diagnosis not present

## 2021-09-14 DIAGNOSIS — R413 Other amnesia: Secondary | ICD-10-CM | POA: Diagnosis not present

## 2021-09-14 DIAGNOSIS — Z7901 Long term (current) use of anticoagulants: Secondary | ICD-10-CM | POA: Insufficient documentation

## 2021-09-14 DIAGNOSIS — I10 Essential (primary) hypertension: Secondary | ICD-10-CM

## 2021-09-14 DIAGNOSIS — M5416 Radiculopathy, lumbar region: Secondary | ICD-10-CM

## 2021-09-14 DIAGNOSIS — W010XXA Fall on same level from slipping, tripping and stumbling without subsequent striking against object, initial encounter: Secondary | ICD-10-CM

## 2021-09-14 LAB — CBC WITH DIFFERENTIAL/PLATELET
Abs Immature Granulocytes: 0.01 10*3/uL (ref 0.00–0.07)
Basophils Absolute: 0 10*3/uL (ref 0.0–0.1)
Basophils Relative: 1 %
Eosinophils Absolute: 0.1 10*3/uL (ref 0.0–0.5)
Eosinophils Relative: 2 %
HCT: 32.7 % — ABNORMAL LOW (ref 36.0–46.0)
Hemoglobin: 10.6 g/dL — ABNORMAL LOW (ref 12.0–15.0)
Immature Granulocytes: 0 %
Lymphocytes Relative: 45 %
Lymphs Abs: 2.6 10*3/uL (ref 0.7–4.0)
MCH: 30.1 pg (ref 26.0–34.0)
MCHC: 32.4 g/dL (ref 30.0–36.0)
MCV: 92.9 fL (ref 80.0–100.0)
Monocytes Absolute: 0.5 10*3/uL (ref 0.1–1.0)
Monocytes Relative: 8 %
Neutro Abs: 2.5 10*3/uL (ref 1.7–7.7)
Neutrophils Relative %: 44 %
Platelets: 352 10*3/uL (ref 150–400)
RBC: 3.52 MIL/uL — ABNORMAL LOW (ref 3.87–5.11)
RDW: 14.4 % (ref 11.5–15.5)
WBC: 5.8 10*3/uL (ref 4.0–10.5)
nRBC: 0 % (ref 0.0–0.2)

## 2021-09-14 LAB — COMPREHENSIVE METABOLIC PANEL
ALT: 21 U/L (ref 0–44)
AST: 21 U/L (ref 15–41)
Albumin: 3.6 g/dL (ref 3.5–5.0)
Alkaline Phosphatase: 48 U/L (ref 38–126)
Anion gap: 10 (ref 5–15)
BUN: 12 mg/dL (ref 8–23)
CO2: 24 mmol/L (ref 22–32)
Calcium: 9.5 mg/dL (ref 8.9–10.3)
Chloride: 111 mmol/L (ref 98–111)
Creatinine, Ser: 1.05 mg/dL — ABNORMAL HIGH (ref 0.44–1.00)
GFR, Estimated: 55 mL/min — ABNORMAL LOW (ref >60)
Glucose, Bld: 116 mg/dL — ABNORMAL HIGH (ref 70–99)
Potassium: 3 mmol/L — ABNORMAL LOW (ref 3.5–5.1)
Sodium: 145 mmol/L (ref 135–145)
Total Bilirubin: 0.6 mg/dL (ref 0.3–1.2)
Total Protein: 6.2 g/dL — ABNORMAL LOW (ref 6.5–8.1)

## 2021-09-14 IMAGING — CT CT HEAD W/O CM
4 of 5 series · 15 of 47 positions shown, 17 images · non-contrast
Comparison: CT examination dated [DATE]

CLINICAL DATA: Head trauma, moderate to severe. On blood thinners.
Status post fall.



[Series 3: head without · axial · non-contrast · 0.41mm/px · z∈[-190,-70]mm · 5 of 36 slices shown, 7 images]
[im 6/36  brain]
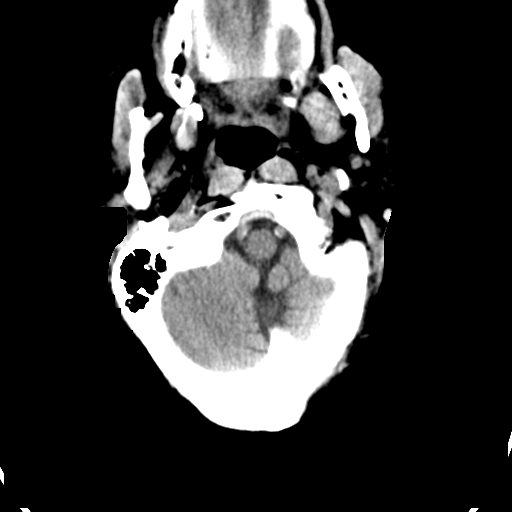
[im 6/36  bone]
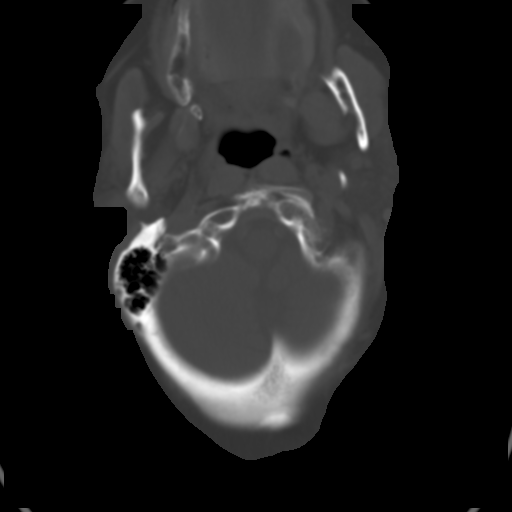
[im 12/36  brain]
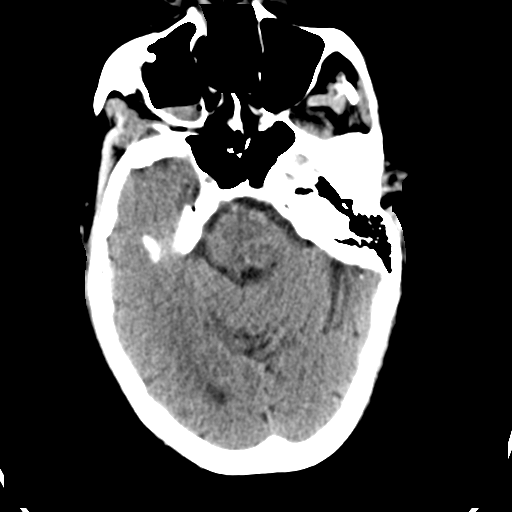
[im 18/36  brain]
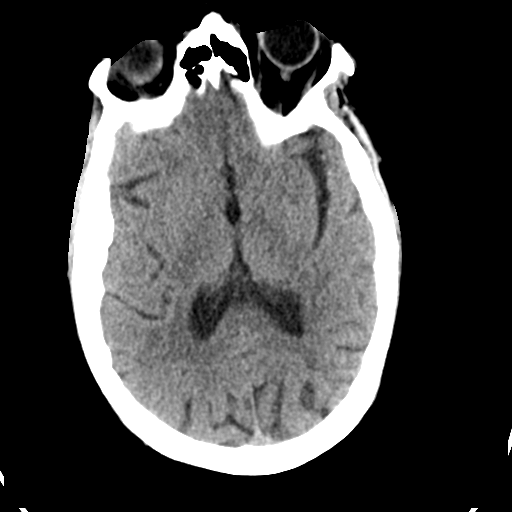
[im 24/36  brain]
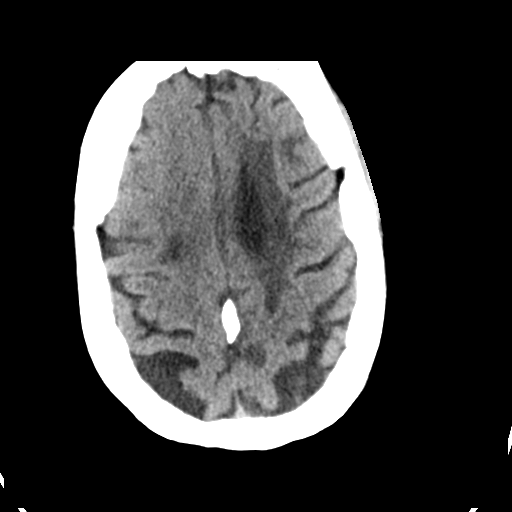
[im 30/36  brain]
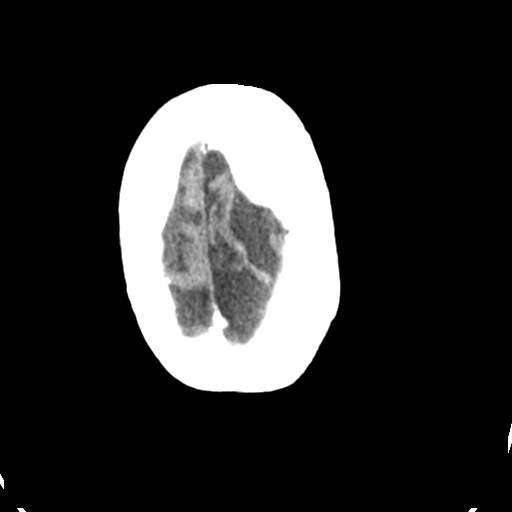
[im 30/36  bone]
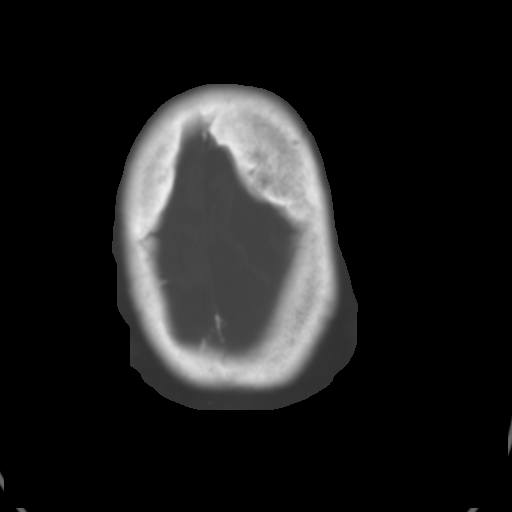

[Series 5: head without cor · coronal · non-contrast · 0.33mm/px · 3 of 71 slices shown]
[im 24/71  brain]
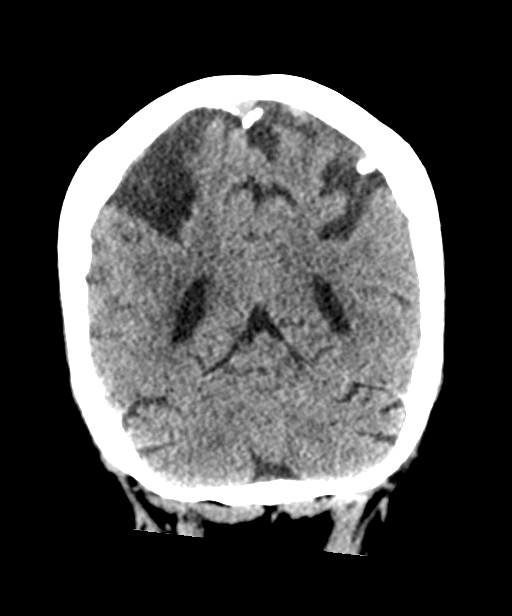
[im 32/71  brain]
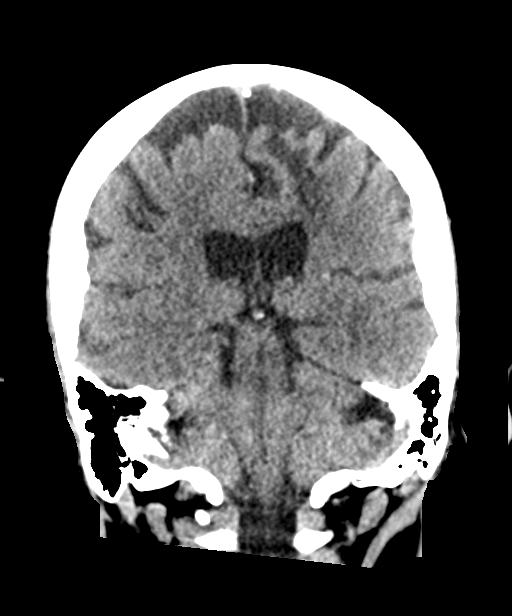
[im 39/71  brain]
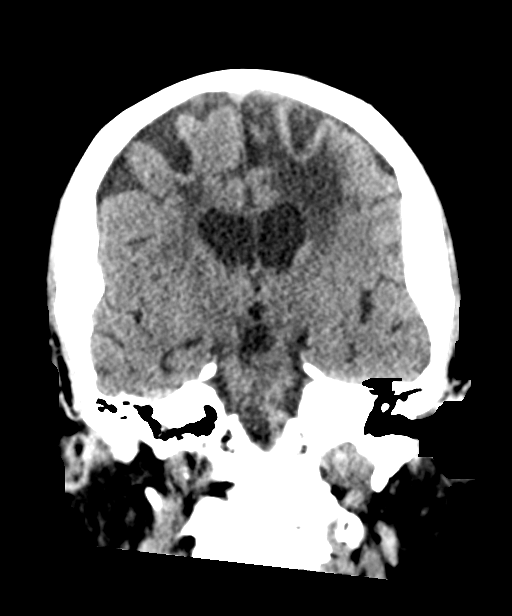

[Series 6: head without sag · sagittal · non-contrast · 0.38mm/px · 3 of 67 slices shown]
[im 23/67  brain]
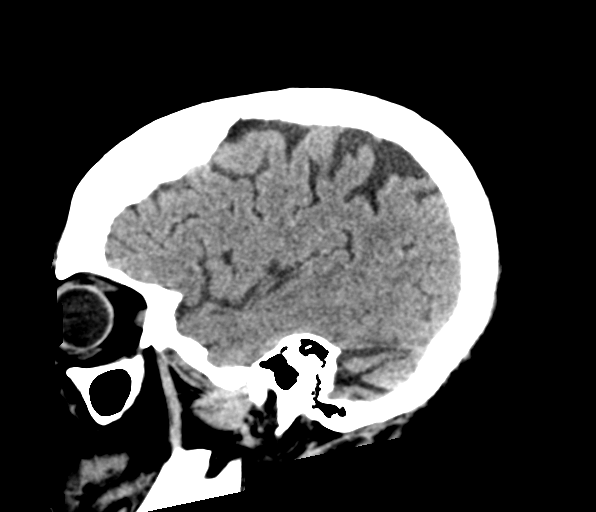
[im 34/67  brain]
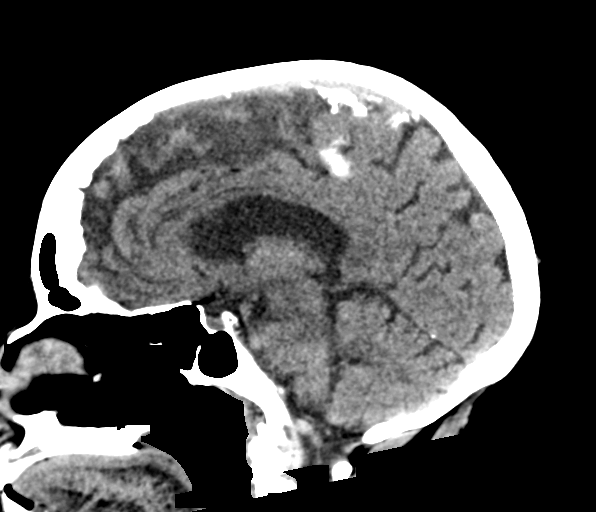
[im 44/67  brain]
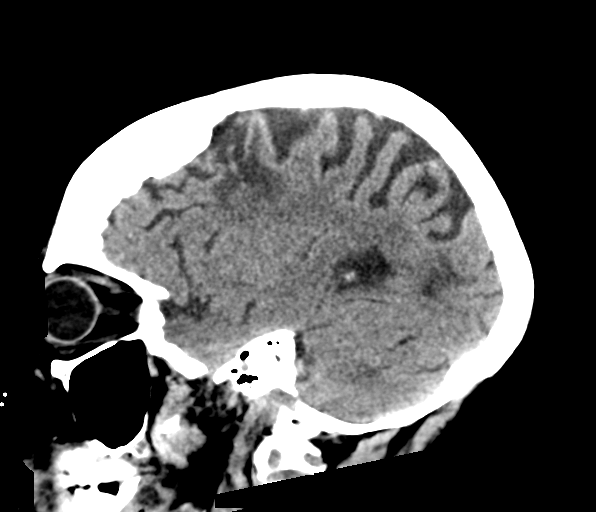

[Series 7: true axial · axial · 0.39mm/px · z∈[-177,-78]mm · 4 of 34 slices shown]
[im 7/34  brain]
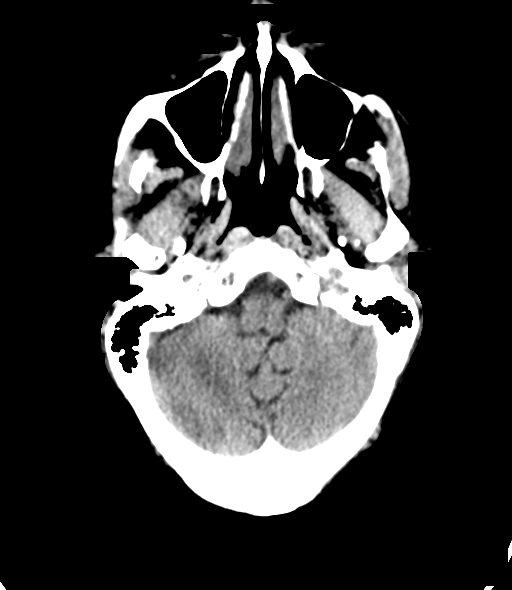
[im 14/34  brain]
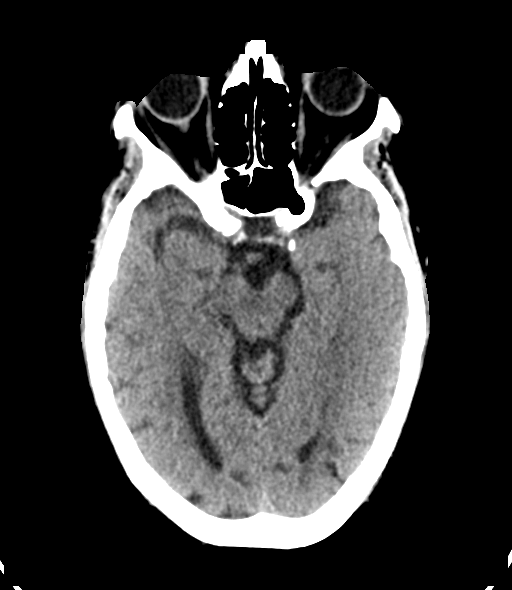
[im 20/34  brain]
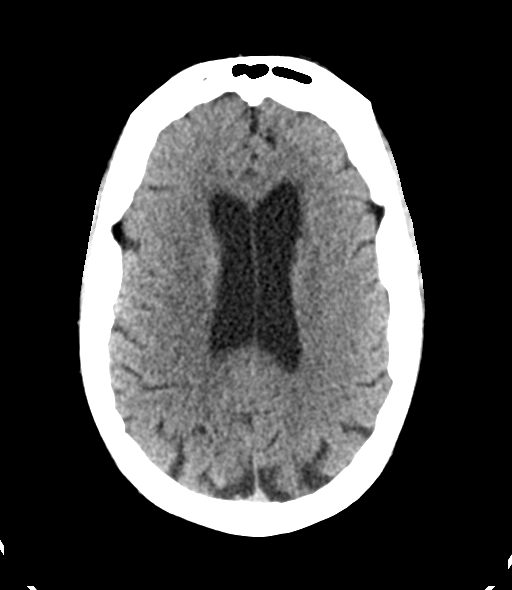
[im 27/34  brain]
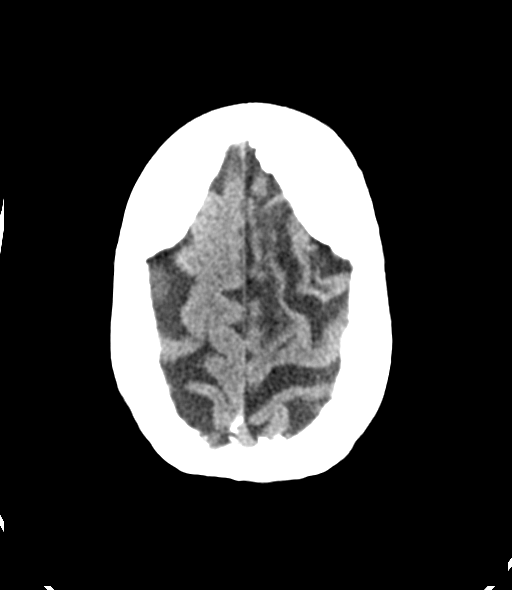

[15 of 47 positions shown; findings below may reference images not displayed]

FINDINGS: Brain: No evidence of acute infarction, hemorrhage, hydrocephalus,
extra-axial collection or mass lesion/mass effect. Left frontal and
occipital lobe encephalomalacia representing old infarcts. Advanced
chronic microvascular ischemic changes of the white matter.

Vascular: No hyperdense vessel or unexpected calcification.

Skull: Hyperostosis frontalis interna. Negative for fracture or
focal lesion.

Sinuses/Orbits: Partial opacification of the right maxillary sinus.

Other: None.
IMPRESSION: 1.  No acute intracranial abnormality.

2.  Chronic findings as above.

3.  Paranasal sinus disease.

## 2021-09-14 IMAGING — CT CT CERVICAL SPINE W/O CM
3 of 4 series · 13 of 33 positions shown, 16 images · non-contrast
Comparison: Cervical spine CT [DATE]

CLINICAL DATA: Trauma.



[Series 4: c_spine 2.0 st · axial · 0.33mm/px · z∈[-310,-198]mm · 5 of 84 slices shown, 7 images]
[im 14/84  soft-tissue]
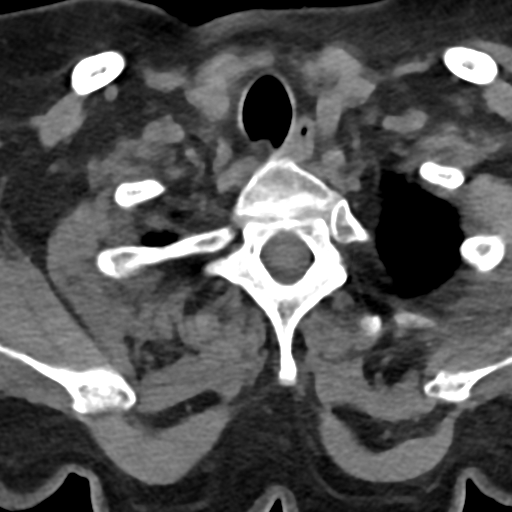
[im 14/84  bone]
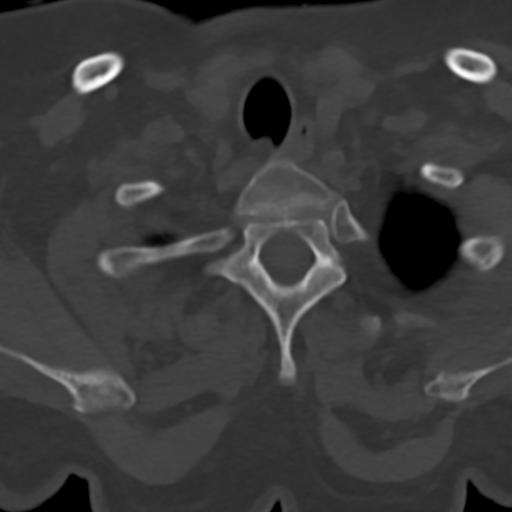
[im 28/84  bone]
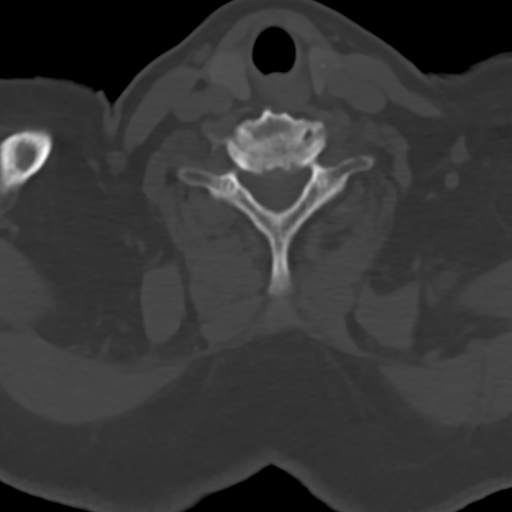
[im 42/84  bone]
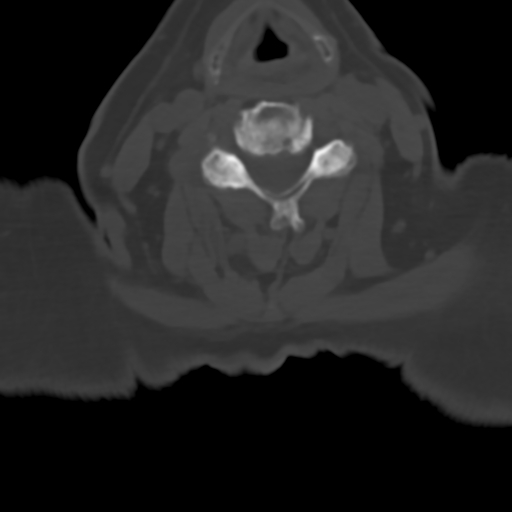
[im 56/84  bone]
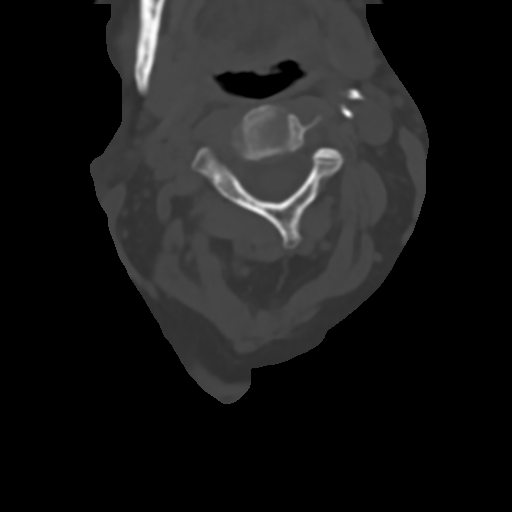
[im 70/84  soft-tissue]
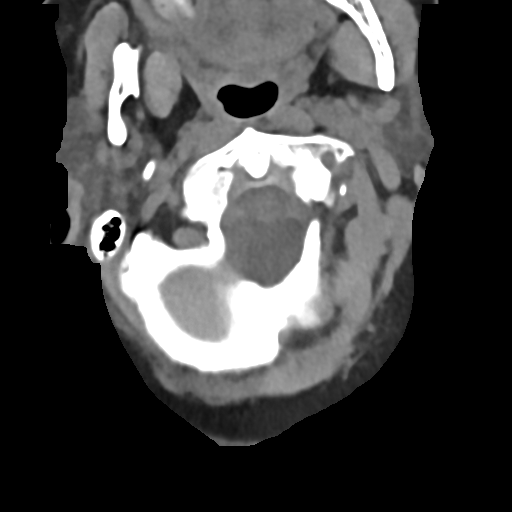
[im 70/84  bone]
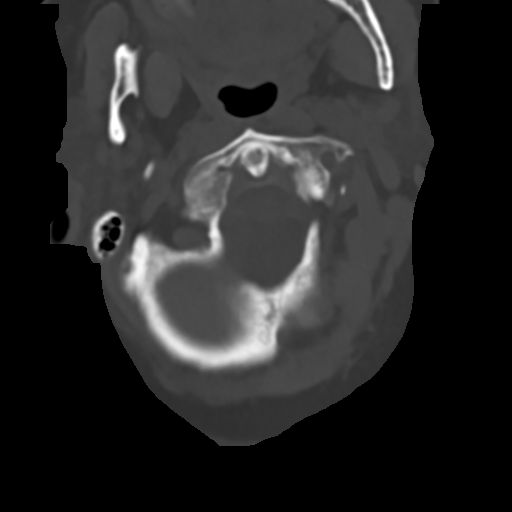

[Series 6: c_spine 2.0 sag bone · sagittal · 0.25mm/px · 5 of 61 slices shown, 6 images]
[im 21/61  bone]
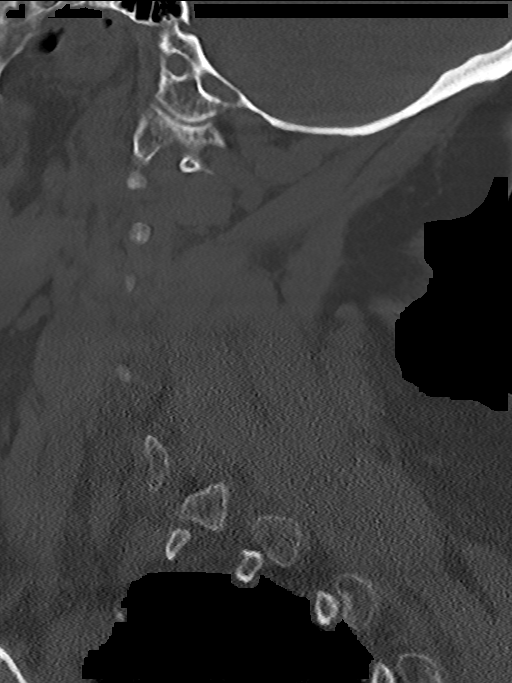
[im 26/61  bone]
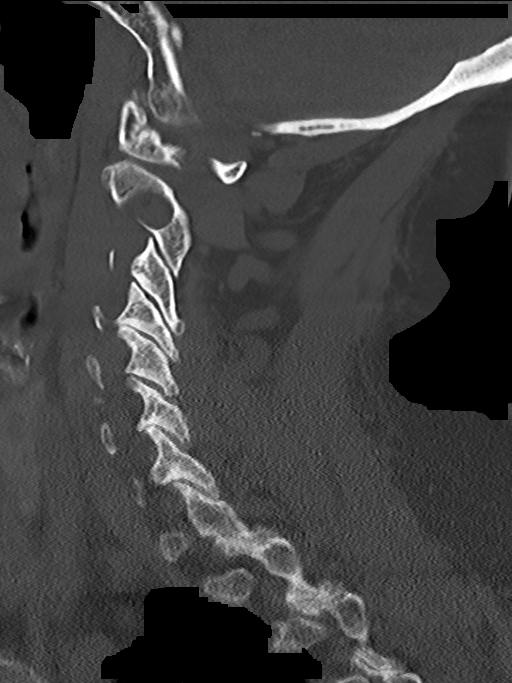
[im 31/61  soft-tissue]
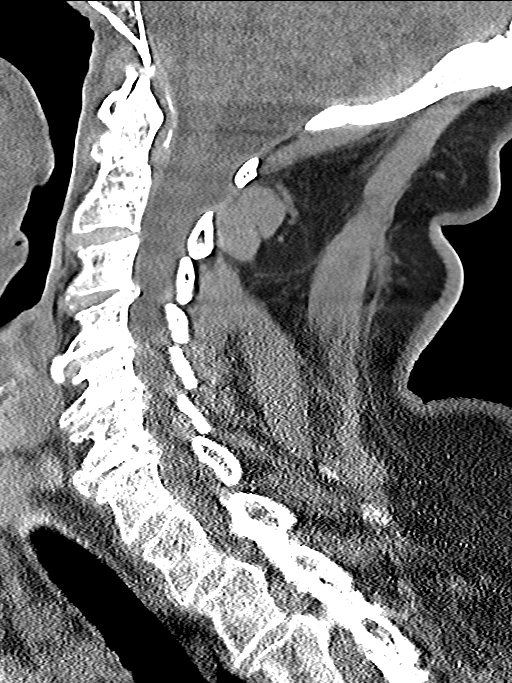
[im 31/61  bone]
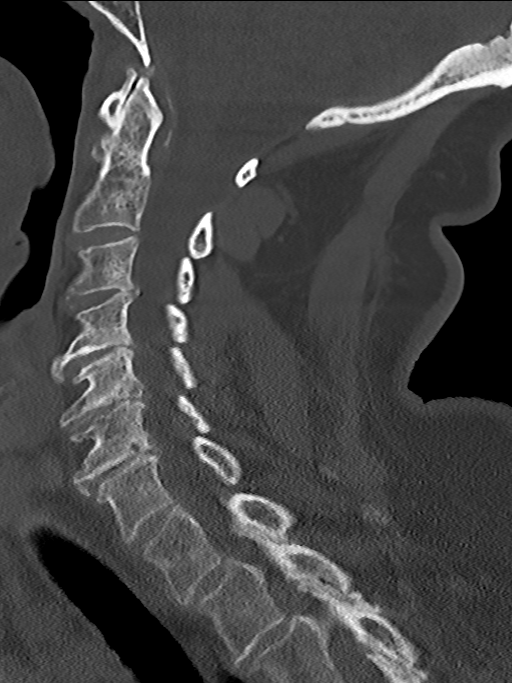
[im 36/61  bone]
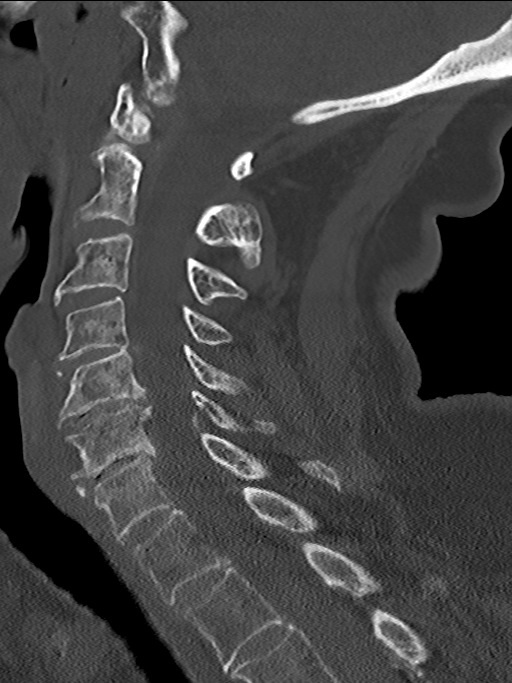
[im 41/61  bone]
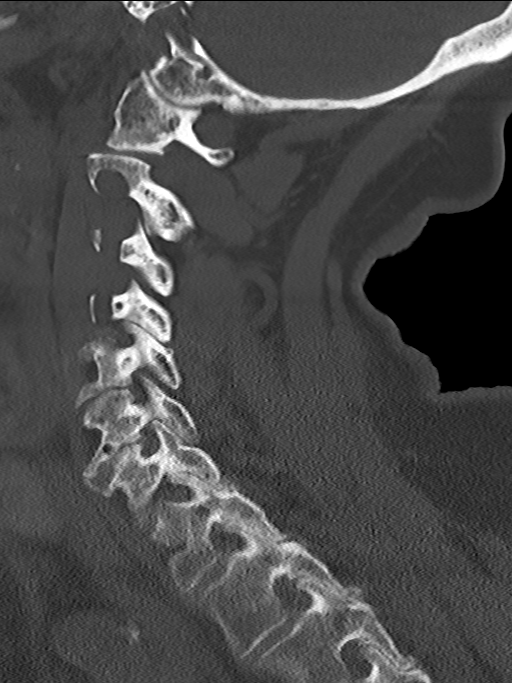

[Series 7: c_spine 2.0 cor bone · coronal · 0.25mm/px · 3 of 61 slices shown]
[im 13/61  bone]
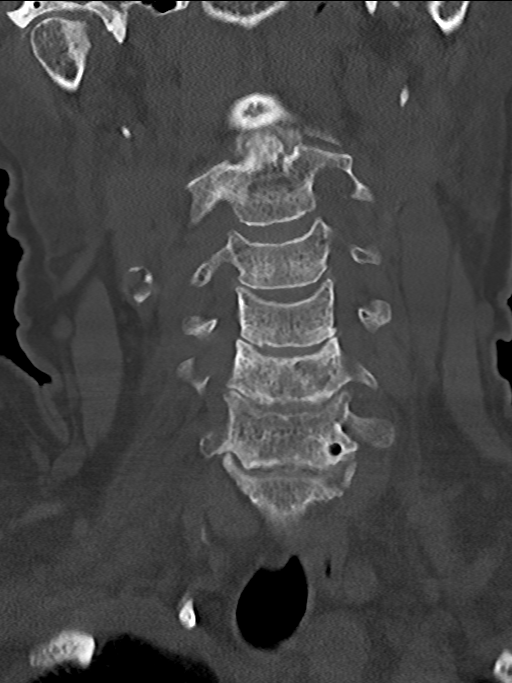
[im 25/61  bone]
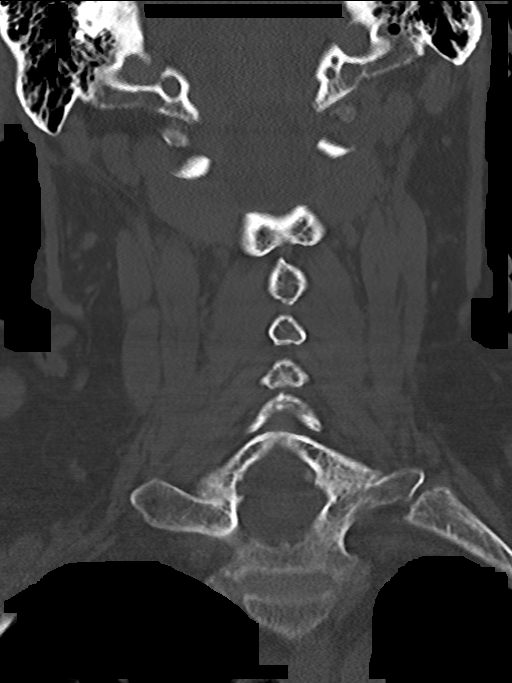
[im 37/61  bone]
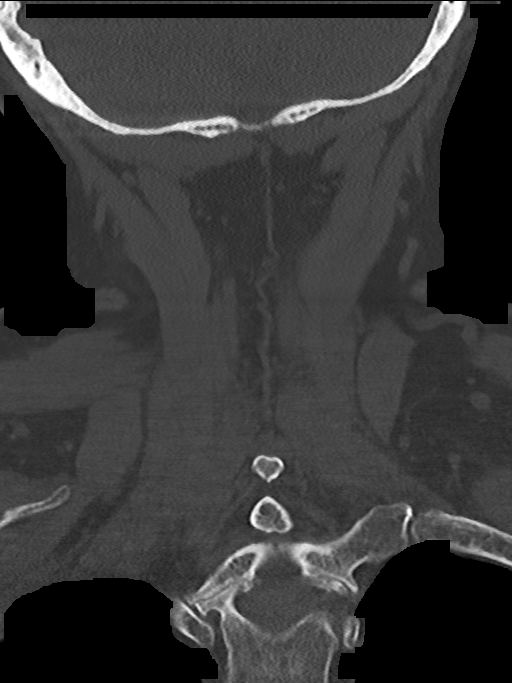

[13 of 33 positions shown; findings below may reference images not displayed]

FINDINGS: Alignment: Normal.

Skull base and vertebrae: No acute fracture. No primary bone lesion
or focal pathologic process.

Soft tissues and spinal canal: No prevertebral fluid or swelling. No
visible canal hematoma.

Disc levels: There is intervertebral disc space narrowing throughout
the cervical spine most significant at C5-C6 where there is a large
posterior disc osteophyte complex causing moderate central canal
stenosis.

Upper chest: Thyroid nodules are present bilaterally measuring up to
9 mm.

Other: None.
IMPRESSION: 1. No evidence for fracture or traumatic subluxation.
2. Moderate multilevel degenerative changes.
3.  Bilateral thyroid nodules measuring up to 9 mm.
Subcentimeter incidental thyroid nodule. No follow-up imaging is
recommended.
Reference: [HOSPITAL]. [DATE]): 143-50

## 2021-09-14 MED ORDER — POTASSIUM CHLORIDE 20 MEQ PO PACK
40.0000 meq | PACK | Freq: Once | ORAL | Status: AC
  Administered 2021-09-14: 40 meq via ORAL
  Filled 2021-09-14: qty 2

## 2021-09-14 MED ORDER — POTASSIUM CHLORIDE CRYS ER 20 MEQ PO TBCR
EXTENDED_RELEASE_TABLET | ORAL | 0 refills | Status: DC
Start: 2021-09-14 — End: 2022-09-12

## 2021-09-14 NOTE — ED Triage Notes (Signed)
Patient fell on Monday and is on thinners.  Patient is on plavix.  Therapist today noticed she is having memory issues and difficulty finding worse and wanted her evaluated.

## 2021-09-14 NOTE — ED Provider Triage Note (Signed)
Emergency Medicine Provider Triage Evaluation Note  Debbie [name] , a 76 y.o. female  was evaluated in triage.  Pt complains of confusion. Pt with hx of PAF and CVA on Plavix who fell 5 days ago, was seen by therapist today who noticed pt is having memory issue and word finding difficulty.  No headache, neck pain, arms or legs weakness  Review of Systems  Positive: As above Negative: As above  Physical Exam  BP (!) 143/75 (BP Location: Left Arm)   Pulse 65   Temp 98.6 F (37 C) (Oral)   Resp 18   Ht '5\' 5"'$  (1.651 m)   Wt 82.6 kg   SpO2 95%   BMI 30.29 kg/m  Gen:   Awake, no distress   Resp:  Normal effort  MSK:   Moves extremities without difficulty  Other:    Medical Decision Making  Medically screening exam initiated at 2:58 PM.  Appropriate orders placed.  Leanna Sato was informed that the remainder of the evaluation will be completed by another provider, this initial triage assessment does not replace that evaluation, and the importance of remaining in the ED until their evaluation is complete.     Domenic Moras, PA-C 09/14/21 1504

## 2021-09-14 NOTE — ED Provider Notes (Signed)
[facility] Provider Note   CSN: [id number] Arrival date & time: 09/14/21  1450     History {Add pertinent medical, surgical, social history, OB history to HPI:1} Chief Complaint  Patient presents with   Sherrlyn Hock is a 76 y.o. female.  Patient with reported mechanical fall 4 days ago - therapist reported noted some memory issue so sent to ED for evaluation. Pt denies any loc with fall. Denies any faintness or dizziness. Since fall indicates feels fine. Denies headache. No neck/back pain. Denies chest pain or discomfort. No abd pain or nv. Normal appetite. No dysuria or gu c/o. No fever or chills. Denies extremtiy pain or injury. On plavix.   The history is provided by the patient and medical records.  Fall Pertinent negatives include no chest pain, no abdominal pain, no headaches and no shortness of breath.      Home Medications Prior to Admission medications   Medication Sig Start Date End Date Taking? Authorizing Provider  acetaminophen (TYLENOL) 500 MG tablet Take 2 tablets (1,000 mg total) by mouth every 8 (eight) hours as needed. 03/30/20   Christophe Louis, MD  amLODipine (NORVASC) 10 MG tablet Take 1 tablet (10 mg total) by mouth daily. 08/02/21 08/02/22  Pokhrel, Corrie Mckusick, MD  apixaban (ELIQUIS) 5 MG TABS tablet Take 1 tablet (5 mg total) by mouth 2 (two) times daily. 07/25/21 08/24/21  Lennice Sites, DO  clopidogrel (PLAVIX) 75 MG tablet Take 1 tablet (75 mg total) by mouth daily with breakfast. 08/01/21 08/01/22  Pokhrel, Corrie Mckusick, MD  ezetimibe (ZETIA) 10 MG tablet Take 1 tablet by mouth once daily 06/15/21   Troy Sine, MD  furosemide (LASIX) 20 MG tablet Take 1 tablet by mouth once daily 06/15/21   Troy Sine, MD  metFORMIN (GLUCOPHAGE) 500 MG tablet Take 1 tablet (500 mg total) by mouth 2 (two) times daily. 08/02/21   Pokhrel, Corrie Mckusick, MD  metoprolol succinate (TOPROL-XL) 100 MG 24 hr tablet Take 1 tablet (100 mg total) by  mouth daily. Take with or immediately following a meal. 07/25/21 08/24/21  Curatolo, Adam, DO  Multiple Vitamins-Minerals (CENTRUM SILVER ADULT 50+) TABS Take 1 tablet by mouth daily.    [provider]  MYRBETRIQ 50 MG TB24 tablet Take 50 mg by mouth daily. 04/12/21   [provider]  nitroGLYCERIN (NITROSTAT) 0.4 MG SL tablet Place 1 tablet (0.4 mg total) under the tongue every 5 (five) minutes as needed for chest pain. 04/05/21 08/02/21  Troy Sine, MD  olmesartan (BENICAR) 20 MG tablet Take 1 tablet (20 mg total) by mouth daily. 03/13/21   Troy Sine, MD  oxyCODONE (OXY IR/ROXICODONE) 5 MG immediate release tablet Take 1 tablet (5 mg total) by mouth every 6 (six) hours as needed for breakthrough pain. 08/05/21   Danford, Suann Larry, MD  polyethylene glycol (MIRALAX / GLYCOLAX) 17 g packet Take 17 g by mouth daily. 08/05/21   Danford, Suann Larry, MD  pregabalin (LYRICA) 25 MG capsule Take 1 capsule (25 mg total) by mouth 2 (two) times daily. 08/05/21   Danford, Suann Larry, MD  rosuvastatin (CRESTOR) 5 MG tablet Take 5 mg by mouth daily. 03/29/21   [provider]      Allergies    Patient has no known allergies.    Review of Systems   Review of Systems  Constitutional:  Negative for chills and fever.  HENT:  Negative for sore throat.  Eyes:  Negative for visual disturbance.  Respiratory:  Negative for shortness of breath.   Cardiovascular:  Negative for chest pain.  Gastrointestinal:  Negative for abdominal pain, blood in stool, diarrhea and vomiting.  Genitourinary:  Negative for dysuria and flank pain.  Musculoskeletal:  Negative for back pain and neck pain.  Skin:  Negative for rash.  Neurological:  Negative for dizziness, light-headedness and headaches.  Hematological:        Denies abnormal bruising or bleeding.   Psychiatric/Behavioral:  Negative for agitation.    Physical Exam Updated Vital Signs BP (!) 133/93   Pulse 60   Temp 98.6  F (37 C) (Oral)   Resp 18   Ht 1.651 m ('5\' 5"'$ )   Wt 82.6 kg   SpO2 90%   BMI 30.29 kg/m  Physical Exam Vitals and nursing note reviewed.  Constitutional:      Appearance: Normal appearance. She is well-developed.  HENT:     Head: Atraumatic.     Nose: Nose normal.     Mouth/Throat:     Mouth: Mucous membranes are moist.  Eyes:     General: No scleral icterus.    Conjunctiva/sclera: Conjunctivae normal.     Pupils: Pupils are equal, round, and reactive to light.  Neck:     Vascular: No carotid bruit.     Trachea: No tracheal deviation.  Cardiovascular:     Rate and Rhythm: Normal rate and regular rhythm.     Pulses: Normal pulses.     Heart sounds: Normal heart sounds. No murmur heard.   No friction rub. No gallop.  Pulmonary:     Effort: Pulmonary effort is normal. No respiratory distress.     Breath sounds: Normal breath sounds.  Abdominal:     General: Bowel sounds are normal. There is no distension.     Palpations: Abdomen is soft.     Tenderness: There is no abdominal tenderness.  Genitourinary:    Comments: No cva tenderness.  Musculoskeletal:        General: No swelling or tenderness.     Cervical back: Normal range of motion and neck supple. No rigidity. No muscular tenderness.  Skin:    General: Skin is warm and dry.     Findings: No rash.  Neurological:     Mental Status: She is alert.     Cranial Nerves: No cranial nerve deficit.     Comments: Alert, speech normal. No dysarthria or aphasia noted. Alert, oriented. GCS 15. Motor/sens grossly intact bil.   Psychiatric:        Mood and Affect: Mood normal.    ED Results / Procedures / Treatments   Labs (all labs ordered are listed, but only abnormal results are displayed) Results for orders placed or performed during the hospital encounter of 09/14/21  CBC with Differential  Result Value Ref Range   WBC 5.8 4.0 - 10.5 K/uL   RBC 3.52 (L) 3.87 - 5.11 MIL/uL   Hemoglobin 10.6 (L) 12.0 - 15.0 g/dL    HCT 32.7 (L) 36.0 - 46.0 %   MCV 92.9 80.0 - 100.0 fL   MCH 30.1 26.0 - 34.0 pg   MCHC 32.4 30.0 - 36.0 g/dL   RDW 14.4 11.5 - 15.5 %   Platelets 352 150 - 400 K/uL   nRBC 0.0 0.0 - 0.2 %   Neutrophils Relative % 44 %   Neutro Abs 2.5 1.7 - 7.7 K/uL   Lymphocytes Relative 45 %  Lymphs Abs 2.6 0.7 - 4.0 K/uL   Monocytes Relative 8 %   Monocytes Absolute 0.5 0.1 - 1.0 K/uL   Eosinophils Relative 2 %   Eosinophils Absolute 0.1 0.0 - 0.5 K/uL   Basophils Relative 1 %   Basophils Absolute 0.0 0.0 - 0.1 K/uL   Immature Granulocytes 0 %   Abs Immature Granulocytes 0.01 0.00 - 0.07 K/uL  Comprehensive metabolic panel  Result Value Ref Range   Sodium 145 135 - 145 mmol/L   Potassium 3.0 (L) 3.5 - 5.1 mmol/L   Chloride 111 98 - 111 mmol/L   CO2 24 22 - 32 mmol/L   Glucose, Bld 116 (H) 70 - 99 mg/dL   BUN 12 8 - 23 mg/dL   Creatinine, Ser 1.05 (H) 0.44 - 1.00 mg/dL   Calcium 9.5 8.9 - 10.3 mg/dL   Total Protein 6.2 (L) 6.5 - 8.1 g/dL   Albumin 3.6 3.5 - 5.0 g/dL   AST 21 15 - 41 U/L   ALT 21 0 - 44 U/L   Alkaline Phosphatase 48 38 - 126 U/L   Total Bilirubin 0.6 0.3 - 1.2 mg/dL   GFR, Estimated 55 (L) >60 mL/min   Anion gap 10 5 - 15   CT Head Wo Contrast  Result Date: 09/14/2021 CLINICAL DATA:  Head trauma, moderate to severe. On blood thinners. Status post fall. EXAM: CT HEAD WITHOUT CONTRAST TECHNIQUE: Contiguous axial images were obtained from the base of the skull through the vertex without intravenous contrast. RADIATION DOSE REDUCTION: This exam was performed according to the departmental dose-optimization program which includes automated exposure control, adjustment of the mA and/or kV according to patient size and/or use of iterative reconstruction technique. COMPARISON:  CT examination dated May 08, 2018 FINDINGS: Brain: No evidence of acute infarction, hemorrhage, hydrocephalus, extra-axial collection or mass lesion/mass effect. 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 Advanced chronic microvascular ischemic changes of the white matter. Vascular: No hyperdense vessel or unexpected calcification. Skull: Hyperostosis frontalis interna. Negative for fracture or focal lesion. Sinuses/Orbits: Partial opacification of the right maxillary sinus. Other: None. IMPRESSION: 1.  No acute intracranial abnormality. 2.  Chronic findings as above. 3.  Paranasal sinus disease. Electronically Signed   By: Keane Police D.O.   On: 09/14/2021 15:50   CT Cervical Spine Wo Contrast  Result Date: 09/14/2021 CLINICAL DATA:  Trauma. EXAM: CT CERVICAL SPINE WITHOUT CONTRAST TECHNIQUE: Multidetector CT imaging of the cervical spine was performed without intravenous contrast. Multiplanar CT image reconstructions were also generated. RADIATION DOSE REDUCTION: This exam was performed according to the departmental dose-optimization program which includes automated exposure control, adjustment of the mA and/or kV according to patient size and/or use of iterative reconstruction technique. COMPARISON:  Cervical spine CT 05/08/2018 FINDINGS: Alignment: Normal. Skull base and vertebrae: No acute fracture. No primary bone lesion or focal pathologic process. Soft tissues and spinal canal: No prevertebral fluid or swelling. No visible canal hematoma. Disc levels: There is intervertebral disc space narrowing throughout the cervical spine most significant at C5-C6 where there is a large posterior disc osteophyte complex causing moderate central canal stenosis. Upper chest: Thyroid nodules are present bilaterally measuring up to 9 mm. Other: None. IMPRESSION: 1. No evidence for fracture or traumatic subluxation. 2. Moderate multilevel degenerative changes. 3.  Bilateral thyroid nodules measuring up to 9 mm. Subcentimeter incidental thyroid nodule. No follow-up imaging is recommended. Reference: J Am Coll Radiol. 2015 Feb;12(2): 143-50 Electronically Signed   By: Warren Lacy  Dagoberto Reef  M.D.   On: 09/14/2021 15:44     EKG EKG Interpretation  Date/Time:  Friday September 14 2021 14:53:06 EDT Ventricular Rate:  63 PR Interval:  136 QRS Duration: 86 QT Interval:  392 QTC Calculation: 401 R Axis:   20 Text Interpretation: Sinus rhythm with Premature atrial complexes Left ventricular hypertrophy with repolarization abnormality Non-specific ST-t changes Confirmed by Lajean Saver [phone]) on 09/14/2021 4:02:20 PM  Radiology CT Head Wo Contrast  Result Date: 09/14/2021 CLINICAL DATA:  Head trauma, moderate to severe. On blood thinners. Status post fall. EXAM: CT HEAD WITHOUT CONTRAST TECHNIQUE: Contiguous axial images were obtained from the base of the skull through the vertex without intravenous contrast. RADIATION DOSE REDUCTION: This exam was performed according to the departmental dose-optimization program which includes automated exposure control, adjustment of the mA and/or kV according to patient size and/or use of iterative reconstruction technique. COMPARISON:  CT examination dated May 08, 2018 FINDINGS: Brain: No evidence of acute infarction, hemorrhage, hydrocephalus, extra-axial collection or mass lesion/mass effect. 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 Advanced chronic microvascular ischemic changes of the white matter. Vascular: No hyperdense vessel or unexpected calcification. Skull: Hyperostosis frontalis interna. Negative for fracture or focal lesion. Sinuses/Orbits: Partial opacification of the right maxillary sinus. Other: None. IMPRESSION: 1.  No acute intracranial abnormality. 2.  Chronic findings as above. 3.  Paranasal sinus disease. Electronically Signed   By: Keane Police D.O.   On: 09/14/2021 15:50   CT Cervical Spine Wo Contrast  Result Date: 09/14/2021 CLINICAL DATA:  Trauma. EXAM: CT CERVICAL SPINE WITHOUT CONTRAST TECHNIQUE: Multidetector CT imaging of the cervical spine was performed without intravenous contrast.  Multiplanar CT image reconstructions were also generated. RADIATION DOSE REDUCTION: This exam was performed according to the departmental dose-optimization program which includes automated exposure control, adjustment of the mA and/or kV according to patient size and/or use of iterative reconstruction technique. COMPARISON:  Cervical spine CT 05/08/2018 FINDINGS: Alignment: Normal. Skull base and vertebrae: No acute fracture. No primary bone lesion or focal pathologic process. Soft tissues and spinal canal: No prevertebral fluid or swelling. No visible canal hematoma. Disc levels: There is intervertebral disc space narrowing throughout the cervical spine most significant at C5-C6 where there is a large posterior disc osteophyte complex causing moderate central canal stenosis. Upper chest: Thyroid nodules are present bilaterally measuring up to 9 mm. Other: None. IMPRESSION: 1. No evidence for fracture or traumatic subluxation. 2. Moderate multilevel degenerative changes. 3.  Bilateral thyroid nodules measuring up to 9 mm. Subcentimeter incidental thyroid nodule. No follow-up imaging is recommended. Reference: J Am Coll Radiol. 2015 Feb;12(2): 143-50 Electronically Signed   By: Ronney Asters M.D.   On: 09/14/2021 15:44    Procedures Procedures  {Document cardiac monitor, telemetry assessment procedure when appropriate:1}  Medications Ordered in ED Medications  potassium chloride (KLOR-CON) packet 40 mEq (has no administration in time range)    ED Course/ Medical Decision Making/ A&P                           Medical Decision Making Risk Prescription drug management.   Iv ns. Continuous pulse ox and cardiac monitoring. Labs ordered/sent. Imaging ordered.   Reviewed nursing notes and prior charts for additional history. External reports reviewed.   Cardiac monitor: sinus rhythm, rate 66.  Labs reviewed/interpreted by me - wbc normal. K mildly low. Kcl po.  CT reviewed/interpreted by me - no  hem.  Po fluids/food.    {Document critical care time when appropriate:1} {Document review of labs and clinical decision tools ie heart score, Chads2Vasc2 etc:1}  {Document your independent review of radiology images, and any outside records:1} {Document your discussion with family members, caretakers, and with consultants:1} {Document social determinants of health affecting pt's care:1} {Document your decision making why or why not admission, treatments were needed:1} Final Clinical Impression(s) / ED Diagnoses Final diagnoses:  None    Rx / DC Orders ED Discharge Orders     None

## 2021-09-14 NOTE — Telephone Encounter (Signed)
RN returned call to patient to reinforce the importance of being taken to the ED since she was on triple therapy. Patient laughed and asked if she really needed to go. Importance again stated. She states that her husband is not home, RN advised her to call her husband and ask him to come home and take her to the ED. Patient verbalizes understanding

## 2021-09-14 NOTE — Discharge Instructions (Addendum)
It was our pleasure to provide your ER care today - we hope that you feel better.  Overall, your ED tests look good. Your potassium level is mildly low (3)- eat plenty of fruits and vegetables, take potassium supplement as prescribed, and follow up with primary care doctor in one week.  Fall precautions.   Follow up with your doctor in the coming week.  Return to ER if worse, new symptoms, fevers, new/severe pain, chest pain, trouble breathing, weak/fainting, change in speech or vision, numbness/weakness, or other concern.

## 2021-09-14 NOTE — Telephone Encounter (Signed)
Can we please try to call her today to reiterate recommendation for ED evaluation due to hitting head while on triple therapy?  Follow up as scheduled.   Loel Dubonnet, NP

## 2021-09-17 ENCOUNTER — Encounter (HOSPITAL_BASED_OUTPATIENT_CLINIC_OR_DEPARTMENT_OTHER): Payer: Self-pay | Admitting: Family

## 2021-09-17 ENCOUNTER — Ambulatory Visit (HOSPITAL_BASED_OUTPATIENT_CLINIC_OR_DEPARTMENT_OTHER): Payer: Medicare PPO | Admitting: Family

## 2021-09-17 VITALS — BP 122/62 | HR 57 | Ht 65.5 in | Wt 174.4 lb

## 2021-09-17 DIAGNOSIS — E876 Hypokalemia: Secondary | ICD-10-CM

## 2021-09-17 DIAGNOSIS — D6859 Other primary thrombophilia: Secondary | ICD-10-CM | POA: Diagnosis not present

## 2021-09-17 DIAGNOSIS — I25118 Atherosclerotic heart disease of native coronary artery with other forms of angina pectoris: Secondary | ICD-10-CM | POA: Diagnosis not present

## 2021-09-17 DIAGNOSIS — I480 Paroxysmal atrial fibrillation: Secondary | ICD-10-CM | POA: Diagnosis not present

## 2021-09-17 DIAGNOSIS — E785 Hyperlipidemia, unspecified: Secondary | ICD-10-CM

## 2021-09-17 DIAGNOSIS — I10 Essential (primary) hypertension: Secondary | ICD-10-CM

## 2021-09-17 MED ORDER — APIXABAN 5 MG PO TABS: 5.0000 mg | ORAL_TABLET | Freq: Two times a day (BID) | ORAL | 5 refills | Status: DC

## 2021-09-17 MED ORDER — METOPROLOL SUCCINATE ER 100 MG PO TB24: 100.0000 mg | ORAL_TABLET | Freq: Every day | ORAL | 3 refills | Status: DC

## 2021-09-17 NOTE — Patient Instructions (Signed)
Medication Instructions:  Your physician has recommended you make the following change in your medication:   STOP Aspirin  CONTINUE Plavix and Eliquis  *If you need a refill on your cardiac medications before your next appointment, please call your pharmacy*  Lab Work: None ordered today.   Testing/Procedures: None ordered today.   Follow-Up: At [facility], you and your health needs are our priority.  As part of our continuing mission to provide you with exceptional heart care, we have created designated Provider Care Teams.  These Care Teams include your primary Cardiologist (physician) and Advanced Practice Providers (APPs -  Physician Assistants and Nurse Practitioners) who all work together to provide you with the care you need, when you need it.  We recommend signing up for the patient portal called "MyChart".  Sign up information is provided on this After Visit Summary.  MyChart is used to connect with patients for Virtual Visits (Telemedicine).  Patients are able to view lab/test results, encounter notes, upcoming appointments, etc.  Non-urgent messages can be sent to your provider as well.   To learn more about what you can do with MyChart, go to [email or web address].    Your next appointment:   3-4 months  The format for your next appointment:   In Person  Provider:   Shelva Majestic, MD {   Other Instructions  Recommend establishing with a primary care provider.  You may call [facility] @ [phone] for a list of primary care providers in your area or visit their website [email or web address] Please have any insurance card available before calling or going online.    Heart Healthy Diet Recommendations: A low-salt diet is recommended. Meats should be grilled, baked, or boiled. Avoid fried foods. Focus on lean protein sources like fish or chicken with vegetables and fruits. The American Heart Association is a Microbiologist!  American Heart  Association Diet and Lifeystyle Recommendations   Exercise recommendations: The American Heart Association recommends 150 minutes of moderate intensity exercise weekly. Try 30 minutes of moderate intensity exercise 4-5 times per week. This could include walking, jogging, or swimming.  Atrial Fibrillation  Atrial fibrillation is a type of heartbeat that is irregular or fast. If you have this condition, your heart beats without any order. This makes it hard for your heart to pump blood in a normal way. Atrial fibrillation may come and go, or it may become a long-lasting problem. If this condition is not treated, it can put you at higher risk for stroke, heart failure, and other heart problems. What are the causes? This condition may be caused by diseases that damage the heart. They include: High blood pressure. Heart failure. Heart valve disease. Heart surgery. Other causes include: Diabetes. Thyroid disease. Being overweight. Kidney disease. Sometimes the cause is not known. What increases the risk? You are more likely to develop this condition if: You are older. You smoke. You exercise often and very hard. You have a family history of this condition. You are a man. You use drugs. You drink a lot of alcohol. You have lung conditions, such as emphysema, pneumonia, or COPD. You have sleep apnea. What are the signs or symptoms? Common symptoms of this condition include: A feeling that your heart is beating very fast. Chest pain or discomfort. Feeling short of breath. Suddenly feeling light-headed or weak. Getting tired easily during activity. Fainting. Sweating. In some cases, there are no symptoms. How is this treated? Treatment for this condition depends on underlying conditions and how  you feel when you have atrial fibrillation. They include: Medicines to: Prevent blood clots. Treat heart rate or heart rhythm problems. Using devices, such as a pacemaker, to correct  heart rhythm problems. Doing surgery to remove the part of the heart that sends bad signals. Closing an area where clots can form in the heart (left atrial appendage). In some cases, your doctor will treat other underlying conditions. Follow these instructions at home: Medicines Take over-the-counter and prescription medicines only as told by your doctor. Do not take any new medicines without first talking to your doctor. If you are taking blood thinners: Talk with your doctor before you take any medicines that have aspirin or NSAIDs, such as ibuprofen, in them. Take your medicine exactly as told by your doctor. Take it at the same time each day. Avoid activities that could hurt or bruise you. Follow instructions about how to prevent falls. Wear a bracelet that says you are taking blood thinners. Or, carry a card that lists what medicines you take. Lifestyle     Do not use any products that have nicotine or tobacco in them. These include cigarettes, e-cigarettes, and chewing tobacco. If you need help quitting, ask your doctor. Eat heart-healthy foods. Talk with your doctor about the right eating plan for you. Exercise regularly as told by your doctor. Do not drink alcohol. Lose weight if you are overweight. Do not use drugs, including cannabis. General instructions If you have a condition that causes breathing to stop for a short period of time (apnea), treat it as told by your doctor. Keep a healthy weight. Do not use diet pills unless your doctor says they are safe for you. Diet pills may make heart problems worse. Keep all follow-up visits as told by your doctor. This is important. Contact a doctor if: You notice a change in the speed, rhythm, or strength of your heartbeat. You are taking a blood-thinning medicine and you get more bruising. You get tired more easily when you move or exercise. You have a sudden change in weight. Get help right away if:  You have pain in your chest  or your belly (abdomen). You have trouble breathing. You have side effects of blood thinners, such as blood in your vomit, poop (stool), or pee (urine), or bleeding that cannot stop. You have any signs of a stroke. "BE FAST" is an easy way to remember the main warning signs: B - Balance. Signs are dizziness, sudden trouble walking, or loss of balance. E - Eyes. Signs are trouble seeing or a change in how you see. F - Face. Signs are sudden weakness or loss of feeling in the face, or the face or eyelid drooping on one side. A - Arms. Signs are weakness or loss of feeling in an arm. This happens suddenly and usually on one side of the body. S - Speech. Signs are sudden trouble speaking, slurred speech, or trouble understanding what people say. T - Time. Time to call emergency services. Write down what time symptoms started. You have other signs of a stroke, such as: A sudden, very bad headache with no known cause. Feeling like you may vomit (nausea). Vomiting. A seizure. These symptoms may be an emergency. Do not wait to see if the symptoms will go away. Get medical help right away. Call your local emergency services (911 in the [place] Do not drive yourself to the hospital. Summary Atrial fibrillation is a type of heartbeat that is irregular or fast. You are at  higher risk of this condition if you smoke, are older, have diabetes, or are overweight. Follow your doctor's instructions about medicines, diet, exercise, and follow-up visits. Get help right away if you have signs or symptoms of a stroke. Get help right away if you cannot catch your breath, or you have chest pain or discomfort. This information is not intended to replace advice given to you by your health care provider. Make sure you discuss any questions you have with your health care provider. Document Revised: 09/23/2018 Document Reviewed: 09/23/2018 Elsevier Patient Education  [place].

## 2021-09-17 NOTE — Progress Notes (Signed)
Patient today is given Eliquis patient assistance forms with 2 box's of '5mg'$  Eliquis Samples   LOT #: [id number] EXP: MARCH 25

## 2021-09-17 NOTE — Progress Notes (Signed)
Office Visit    Patient Name: Debbie [name] Date of Encounter: 09/17/2021  PCP:  Sandi Mariscal, [facility]  Cardiologist:  Shelva Majestic, MD  Advanced Practice Provider:  No care team member to display Electrophysiologist:  None     Chief Complaint    Debbie [name] is a 76 y.o. female with a hx of CAD s/p DES to RCA, PAF, DM2, CVA, HTN, HLD, mild obesity presents today for hospital follow up.    Past Medical History    Past Medical History:  Diagnosis Date   Arthritis    CAD (coronary artery disease) 11/1999   RCA stent X3, patent '06. Nuc low risk 9/13   Cataract    beginning stages   Diabetes mellitus without complication ([place])    Dysrhythmia    History of stress test 01/08/2012   Showed minimal anterior thinning not felt to be significant.   Hx of echocardiogram 12/12/2011   EF>55%   Hypercholesteremia    Hypertension    Myocardial infarction [facility])    18 years ago    age 47   PAF (paroxysmal atrial fibrillation) ([place]) 11/2011   SSS component with some bradycardia   Stroke ([place]) 1998   Vitamin D deficiency    Past Surgical History:  Procedure Laterality Date   ABDOMINAL HYSTERECTOMY     CATARACT EXTRACTION, BILATERAL     CHOLECYSTECTOMY     COLONOSCOPY     CORONARY ANGIOGRAM  08/2004   patent stents   CORONARY ANGIOPLASTY WITH STENT PLACEMENT  11/1999   X 3   CORONARY STENT INTERVENTION N/A 07/31/2021   Procedure: CORONARY STENT INTERVENTION;  Surgeon: Troy Sine, MD;  Location: [place] CV [facility];  Service: Cardiovascular;  Laterality: N/A;   EYE SURGERY     LEFT HEART CATH AND CORONARY ANGIOGRAPHY N/A 07/31/2021   Procedure: LEFT HEART CATH AND CORONARY ANGIOGRAPHY;  Surgeon: Troy Sine, MD;  Location: [place] CV [facility];  Service: Cardiovascular;  Laterality: N/A;   MULTIPLE TOOTH EXTRACTIONS     ROBOTIC ASSISTED TOTAL HYSTERECTOMY WITH BILATERAL SALPINGO OOPHERECTOMY Bilateral 03/29/2020   Procedure: XI ROBOTIC  ASSISTED TOTAL HYSTERECTOMY WITH BILATERAL SALPINGO OOPHORECTOMY;  Surgeon: Christophe Louis, MD;  Location: [place];  Service: Gynecology;  Laterality: Bilateral;  Tracie to RNFA confirmed on 02/16/20 CS    Allergies  No Known Allergies  History of Present Illness    [name] is a 76 y.o. female with a hx of CAD s/p DES to RCA, PAF, DM2, CVA, HTN, HLD, mild obesity last seen while hospitalized.  She has known CAD dating back to 2001 with acute coronary syndrome with occlusion of RCA.  She had 3 stents to RCA.  Subsequent catheterization 2006 with patent stents.  Nuclear perfusion study September 2013 with minimal anterior thinning felt to be not significant.  Underwent lower extremity Doppler due to claudication showing mild aortic insufficiency.  Seen September 2014 diagnosed with DM 2.  She had nuclear perfusion study 01/2016 due to chest pain which was normal with a EF 58%.  Last seen in clinic 05/11/2021.  BP was elevated in clinic but well controlled at home and she was encouraged to continue monitoring.  She was without anginal symptoms and doing overall well from a cardiac perspective.    Since last seen she has had multiple admissions: 07/02/21 underwent posterior lumbar interbody fusion L3-4, L4-5.   3/27 - 07/10/2021 admission HFpEF exacerbation thought to  be related to fluid received during surgery.  Echocardiogram revealed preserved EF.  07/25/2021 ED visit with atrial fibrillation - started on Eliquis 5 mg twice daily and Toprol increased to 100 mg daily. Magnesium and potassium were repleted.  4/16 - 08/01/2021 admission after presenting with chest pain that resolved with nitroglycerin.  Underwent cardiac catheterization 07/31/2021 with 80% proximal prox RCA lesion between prox and mid stent with ISR in mid stent, patent distal third RCA stent. RCA treated with 2.5x24m Medtronic Onyx Frontier stent. Recommended for ASA/plavix/eliquis for 1 month then Plavix/Eliquis only.  4/20-4/23/23  presented with weakness, leg pain. Neurosurgery consulted suspect pain from radiculities from progressive subsidence noted on CT. Discharged with back brace, Lyrica, PT. Plan to follow up outpatient with neurosurgery and cardiology for consideration of surgery with need to hold Plavix.  09/14/21 ED visit after mechanical fall and hitting her head. Potassium was repleted. Head CT unremarkable.   She presents today for follow-up with her husband.  [facility] [place] has since been [place].  She is using her [place] at home and followed with home health nursing and [place] [facility] No chest pain, dyspnea, lightheadedness, dizziness, palpitations. Stamina gradually improving. She has MRI upcoming 09/27/21 and is hopeful for further recommendations from surgery as she is still having significant pain limiting ambulation.   EKGs/Labs/Other Studies Reviewed:   The following studies were reviewed today:  Cardiac cath 07/31/21   Mid RCA lesion is 50% stenosed.   Prox RCA lesion is 80% stenosed.   Previously placed Ost RCA to Prox RCA stent (unknown type) is  widely patent.   Previously placed Dist RCA stent (unknown type) is  widely patent.   A drug-eluting stent was successfully placed.   Post intervention, there is a 0% residual stenosis.   Post intervention, there is a 0% residual stenosis.   Post intervention, there is a 0% residual stenosis.   Normal normal LAD and left circumflex coronary arteries.   The right coronary artery had a previously placed patent proximal stent, 80% stenosis between the proximal and mid stent with 50% in-stent narrowing in the mid stent and a patent distal third stent.   LVEDP 17 mmHg.   Successful percutaneous coronary intervention to the RCA with insertion of a 2.5 x 18 mm Medtronic Onyx Frontier stent was dilated to 2.76 mm and PTCA of the mid in-stent 50 to 60% stenosis.   RECOMMENDATION: Recommend resumption of  Eliquis commencing tomorrow with initial triple drug therapy for 1 month followed by Plavix/Eliquis.  The patient presented with significant hypertension and will need optimal blood pressure control.  Aggressive lipid-lowering therapy with target LDL less than 70.   Echo 07/09/2021  1. Left ventricular ejection fraction, by estimation, is 55 to 60%. The  left ventricle has normal function. The left ventricle has no regional  wall motion abnormalities. Left ventricular diastolic function could not  be evaluated.   2. Right ventricular systolic function is normal. The right ventricular  size is normal.   3. The mitral valve is normal in structure. Trivial mitral valve  regurgitation. No evidence of mitral stenosis.   4. The aortic valve is tricuspid. Aortic valve regurgitation is not  visualized. No aortic stenosis is present.   5. The inferior vena cava is normal in size with greater than 50%  respiratory variability, suggesting right atrial pressure of 3 mmHg.   Comparison(s): No prior Echocardiogram.  EKG: No EKG today  Recent Labs: 07/25/2021: B Natriuretic Peptide 242.1; TSH 1.401 08/03/2021: Magnesium 1.7 09/14/2021: ALT 21; BUN 12; Creatinine, Ser 1.05; Hemoglobin 10.6; Platelets 352; Potassium 3.0; Sodium 145  Recent Lipid Panel    Component Value Date/Time   CHOL 110 07/30/2021 0508   CHOL 143 06/29/2021 0819   TRIG 96 07/30/2021 0508   HDL 44 07/30/2021 0508   HDL 62 06/29/2021 0819   CHOLHDL 2.5 07/30/2021 0508   VLDL 19 07/30/2021 0508   LDLCALC 47 07/30/2021 0508   LDLCALC 63 06/29/2021 0819    Risk Assessment/Calculations:   CHA2DS2-VASc Score = 8   This indicates a 10.8% annual risk of stroke. The patient's score is based upon: CHF History: 0 HTN History: 1 Diabetes History: 1 Stroke History: 2 Vascular Disease History: 1 Age Score: 2 Gender Score: 1     Home Medications   Current Meds  Medication Sig   acetaminophen (TYLENOL) 500 MG tablet Take 2  tablets (1,000 mg total) by mouth every 8 (eight) hours as needed.   amLODipine (NORVASC) 10 MG tablet Take 1 tablet (10 mg total) by mouth daily.   clopidogrel (PLAVIX) 75 MG tablet Take 1 tablet (75 mg total) by mouth daily with breakfast.   ezetimibe (ZETIA) 10 MG tablet Take 1 tablet by mouth once daily   furosemide (LASIX) 20 MG tablet Take 1 tablet by mouth once daily   metFORMIN (GLUCOPHAGE) 500 MG tablet Take 1 tablet (500 mg total) by mouth 2 (two) times daily.   Multiple Vitamins-Minerals (CENTRUM SILVER ADULT 50+) TABS Take 1 tablet by mouth daily.   MYRBETRIQ 50 MG TB24 tablet Take 50 mg by mouth daily.   nitroGLYCERIN (NITROSTAT) 0.4 MG SL tablet Place 1 tablet (0.4 mg total) under the tongue every 5 (five) minutes as needed for chest pain.   olmesartan (BENICAR) 20 MG tablet Take 1 tablet (20 mg total) by mouth daily.   oxyCODONE (OXY IR/ROXICODONE) 5 MG immediate release tablet Take 1 tablet (5 mg total) by mouth every 6 (six) hours as needed for breakthrough pain.   polyethylene glycol (MIRALAX / GLYCOLAX) 17 g packet Take 17 g by mouth daily.   potassium chloride SA (KLOR-CON M) 20 MEQ tablet One po bid x 2 days, then one po once a day   pregabalin (LYRICA) 25 MG capsule Take 1 capsule (25 mg total) by mouth 2 (two) times daily.   rosuvastatin (CRESTOR) 5 MG tablet Take 5 mg by mouth daily.   [DISCONTINUED] apixaban (ELIQUIS) 5 MG TABS tablet Take 1 tablet (5 mg total) by mouth 2 (two) times daily.   [DISCONTINUED] Aspirin 81 MG CAPS Take by mouth.   [DISCONTINUED] metoprolol succinate (TOPROL-XL) 100 MG 24 hr tablet Take 1 tablet (100 mg total) by mouth daily. Take with or immediately following a meal.     Review of Systems     All other systems reviewed and are otherwise negative except as noted above.  Physical Exam    VS:  BP 122/62   Pulse (!) 57   Ht 5' 5.5" (1.664 m)   Wt 174 lb 6.4 oz (79.1 kg)   SpO2 97%   BMI 28.58 kg/m  , BMI Body mass index is 28.58  kg/m.  Wt Readings from Last 3 Encounters:  09/17/21 174 lb 6.4 oz (79.1 kg)  09/14/21 182 lb (82.6 kg)  08/05/21 182 lb 5.1 oz (82.7 kg)    GEN: Well nourished, well developed, in no acute distress. HEENT: normal. Neck: Supple,  no JVD, carotid bruits, or masses. Cardiac: RRR, no murmurs, rubs, or gallops. No clubbing, cyanosis, edema.  Radials/PT 2+ and equal bilaterally.  Respiratory:  Respirations regular and unlabored, clear to auscultation bilaterally. GI: Soft, nontender, nondistended. MS: No deformity or atrophy. Skin: Warm and dry, no rash. Neuro:  Strength and sensation are intact. Psych: Normal affect.  Assessment & Plan    HTN - BP well controlled. Continue current antihypertensive regimen Lasix 20 mg daily, Toprol 100 mg daily, olmesartan 20 mg daily, amlodipine 10 mg daily.  CAD - Stable with no anginal symptoms.  07/31/2021 PCI/DES to mid RCA and PTCA of previously placed stent with 50-60% stenosis. Discontinue Aspirin as she has completed 1 month of triple therapy. GDMT includes Plavix, Toprol, rosuvastatin, Zetia. Heart healthy diet and regular cardiovascular exercise encouraged.    Preop clearance - Upcoming MRI 09/27/21 following with neurosurgery. CT lumbar spine during 07/2021 admission with progressive subsidence (disk heigh loss despite fusion) concerning for radiculitis leading to pain. Presently with brace, pain medication, PT and plan for possible surgery pending upcoming imaging. She had DES 07/31/21 with usual recommendation for uninterrupted Plavix x 6 months. Will route to Dr. Claiborne Billings for his input on potential holding of Plavix. Eliquis would need to be held prior to surgery and reviewed by pharmacy team.   Hypokalemia - 09/14/21 K 3.0 in ED treated with K 20 BID 2 days then daily x 10 days. Repeat BMP in 1 week.   Atrial fibrillation /hypercoagulable state -maintaining sinus rhythm by auscultation today.  Continue Toprol 100 mg daily.  Continue Eliquis 5 mg twice  daily. Refill provided - patient assistance paperwork provided.   09/14/20 Hb 10.6 which is stable compared to previous. Denies bleeding complications.  Does not meet dose reduction criteria. CHA2DS2-VASc Score = 8 [CHF History: 0, HTN History: 1, Diabetes History: 1, Stroke History: 2, Vascular Disease History: 1, Age Score: 2, Gender Score: 1].  Therefore, the patient's annual risk of stroke is 10.8 %.      HLD, LDL goal <70 -07/30/2021 total cholesterol 110, triglyceride 96, HDL 44, LDL 47.  Continue Zetia, rosuvastatin.  Bradycardia -heart rate today 57 bpm.  She is asymptomatic with no lightheadedness, dizziness.  Continue Toprol 100 mg daily.     Cardiac Rehabilitation Eligibility Assessment  The patient is ready to start cardiac rehabilitation from a cardiac standpoint.    Disposition: Follow up in 3 month(s) with Shelva Majestic, MD or APP.  Signed, Loel Dubonnet, NP 09/17/2021, 6:23 PM [facility]

## 2021-09-18 ENCOUNTER — Telehealth (HOSPITAL_BASED_OUTPATIENT_CLINIC_OR_DEPARTMENT_OTHER): Payer: Self-pay

## 2021-09-18 ENCOUNTER — Telehealth: Payer: Self-pay | Admitting: Family

## 2021-09-18 DIAGNOSIS — I10 Essential (primary) hypertension: Secondary | ICD-10-CM

## 2021-09-18 NOTE — Telephone Encounter (Signed)
Patient's husband returned patients Eliquis Patient Assistance forms, RN fax today, awaiting response.

## 2021-09-18 NOTE — Telephone Encounter (Signed)
RN returned call to patient's husband and provided the following recommendations. Mr. nazaria riesen understanding and is agreeable to plan.     NP notified and states that next week is ok.   Also notify patient of the following,   Gilford Rile, Martie Lee, NP  Gerald Stabs, RN Needs repeat potassium after recent ED visit. Recommend BMP next week (if she wants to have it on 09/27/21 when she is in the building for MRI that would be okay)

## 2021-09-18 NOTE — Telephone Encounter (Signed)
Patient's husband called stating [facility] wanted to come in today.  However he has too much going on at the house today.  He told her he couldn't have her come by today, she told him she didn't have any other availability the rest of the week. He advised her he is available any other day, but today he has people coming in to do work at the house. He wanted to make [facility] aware and he was also going to call [facility].

## 2021-09-20 ENCOUNTER — Telehealth: Payer: Self-pay | Admitting: Cardiovascular Disease

## 2021-09-20 NOTE — Telephone Encounter (Signed)
Left message for jim, I have talked to the patient and not making any changes at this time. Spoke with pt, she reports she is not having dizziness and did not have any symptoms with standing. She is having weakness that started after her fall. She feels fine and will call if she has any questions or concerns.

## 2021-09-20 NOTE — Telephone Encounter (Signed)
Spoke with Debbie [name], 2 days ago when she saw the patient she complained of weakness. She reports the patients heart rate was 54-55 bpm. There was no new swelling or SOB. Spoke with pt, she reports she has muscle weakness and is not strong so she is having trouble walking. She reports this started after her fall. She goes to have a MRI on 6/15 and will have her labs rechecked at that time. The patient will call if she has any problems.

## 2021-09-20 NOTE — Telephone Encounter (Signed)
STAT if HR is under 50 or over 120 (normal HR is 60-100 beats per minute)  What is your heart rate? 55-60  Do you have a log of your heart rate readings (document readings)? yes  Do you have any other symptoms? Having irregular heartbeat (possible afib), weakness, and fatigue

## 2021-09-20 NOTE — Telephone Encounter (Signed)
Calling to give patient bp   Pt c/o BP issue: STAT if pt c/o blurred vision, one-sided weakness or slurred speech  1. What are your last 5 BP readings? Seated position 140/60 HR 61 Stood up 94/55 HR 55 Report no symptom  Patient went to the Bathroom and back  bp was 142/82 HR67  2. Are you having any other symptoms (ex. Dizziness, headache, blurred vision, passed out)? Patient state she was fatigue on yesterday   3. What is your BP issue? Had mild irregular heart rate.

## 2021-09-20 NOTE — Telephone Encounter (Signed)
Noted.   Arlett Goold S Oluwaseyi Raffel, NP  

## 2021-09-20 NOTE — Telephone Encounter (Signed)
Received fax that at this time the patient does not qualify for Patient Assistance. Routed to NP as FYI and message sent to patient.

## 2021-09-25 ENCOUNTER — Telehealth: Payer: Self-pay | Admitting: Cardiovascular Disease

## 2021-09-25 NOTE — Telephone Encounter (Addendum)
Beth from [facility] reported that patient's pulse ranges 48-58 and irregular. She confirmed she checks apical pulse for 1 minute. She stated patient is asymptomatic and has BP ranging from 120/70 to 130/70. Patient takes metoprolol succinate 100 mg daily. Beth wants to know if the heart rate parameters should be changed; it is now set at a low of 60. Please advise.

## 2021-09-25 NOTE — Telephone Encounter (Signed)
STAT if HR is under 50 or over 120 (normal HR is 60-100 beats per minute)  What is your heart rate? 52  Do you have a log of your heart rate readings (document readings)? Going between 48 and 58 and irregular  Do you have any other symptoms? No   Beth with [facility] calling leave message, and stated that it wasn't stat and that she just needed to know how they could best manage this.

## 2021-09-27 ENCOUNTER — Ambulatory Visit (HOSPITAL_BASED_OUTPATIENT_CLINIC_OR_DEPARTMENT_OTHER)
Admission: RE | Admit: 2021-09-27 | Discharge: 2021-09-27 | Disposition: A | Discharge status: Home / Self Care | Payer: Medicare PPO | Source: Ambulatory Visit | Attending: Student | Admitting: Student

## 2021-09-27 DIAGNOSIS — R609 Edema, unspecified: Secondary | ICD-10-CM | POA: Insufficient documentation

## 2021-09-27 DIAGNOSIS — M5416 Radiculopathy, lumbar region: Secondary | ICD-10-CM | POA: Diagnosis present

## 2021-09-27 IMAGING — MR MR LUMBAR SPINE WO/W CM
4 of 7 series · 24 of 48 positions shown · IV contrast (GADAVIST)
Comparison: [DATE] lumbar spine CT

CLINICAL DATA: Chronic lower back pain with bilateral leg weakness

EXAM:
MRI LUMBAR SPINE WITHOUT AND WITH CONTRAST
TECHNIQUE: Multiplanar and multiecho pulse sequences of the lumbar spine were
obtained without and with intravenous contrast.
CONTRAST:  7.5mL GADAVIST GADOBUTROL 1 MMOL/ML IV SOLN

[Series 3: T1 · sagittal · 4.0mm · 0.41mm/px · 5 of 15 slices shown (1 of 2)]
[im 1/15]
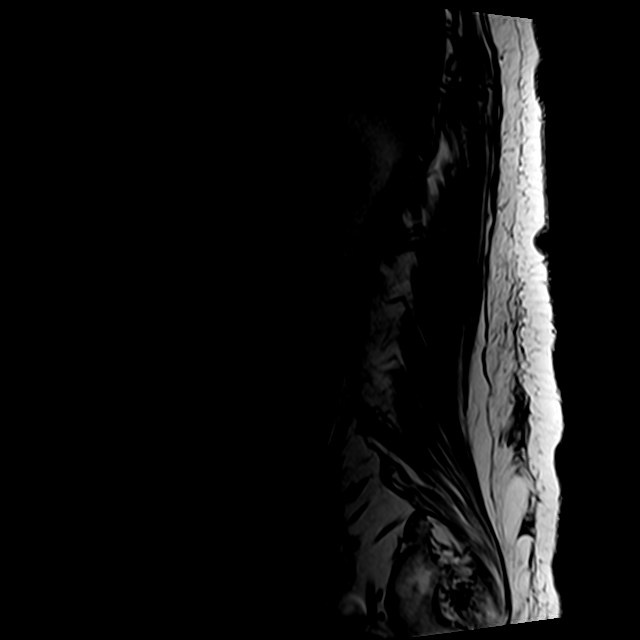
[im 4/15]
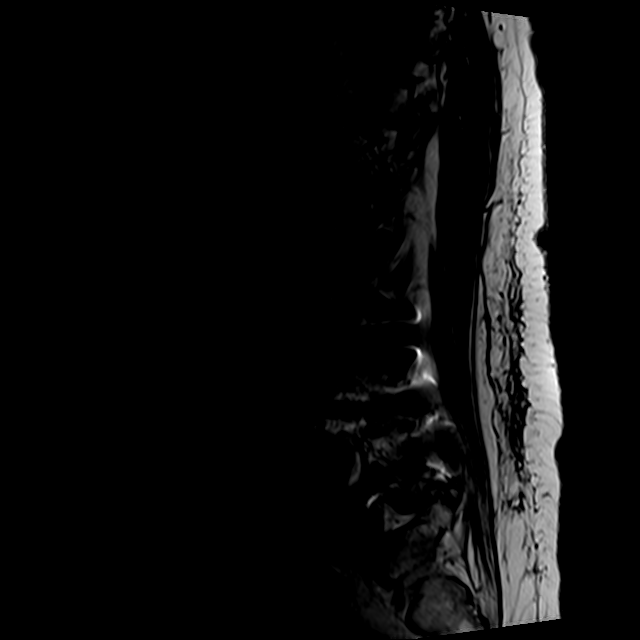
[im 8/15]
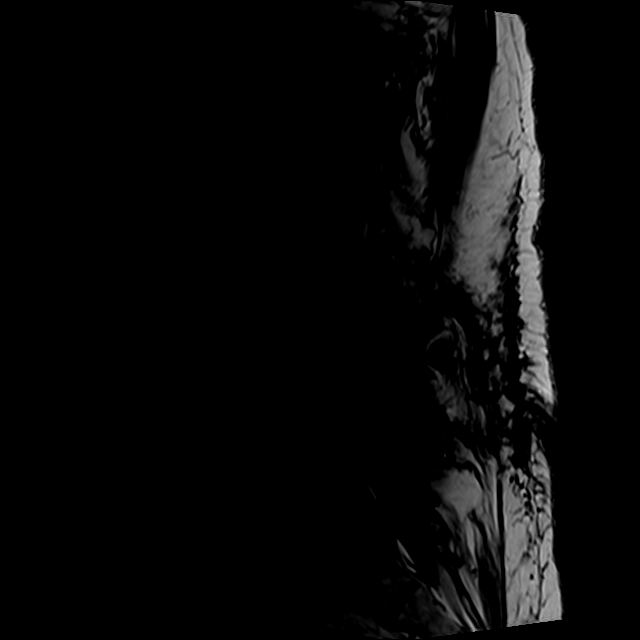
[im 11/15]
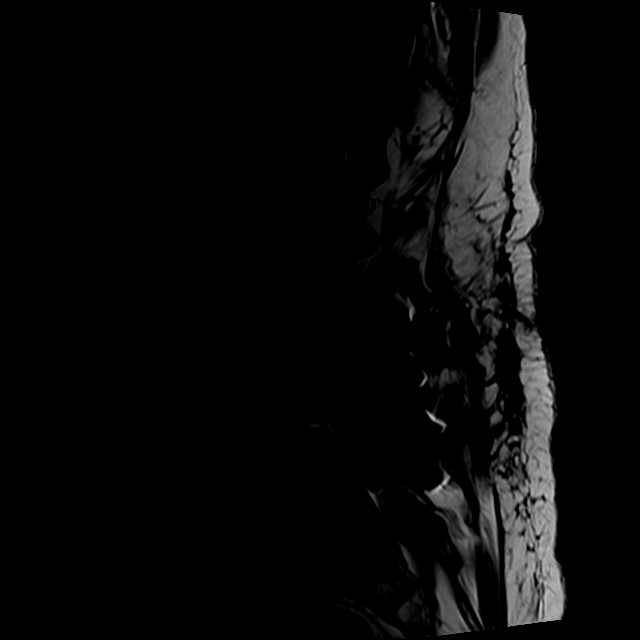
[im 15/15]
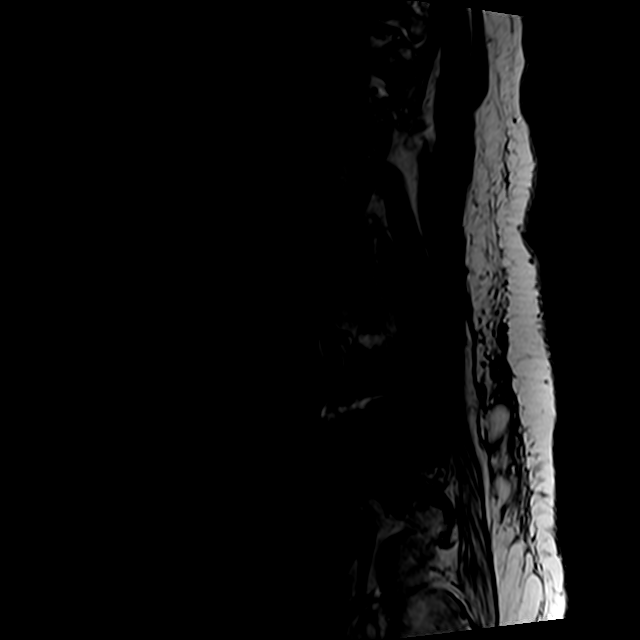

[Series 5: T2 · axial · 4.0mm · 0.78mm/px · z∈[-38,+164]mm · 8 of 36 slices shown (1 of 2)]
[im 1/36]
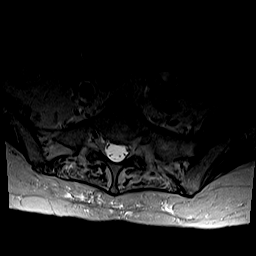
[im 4/36]
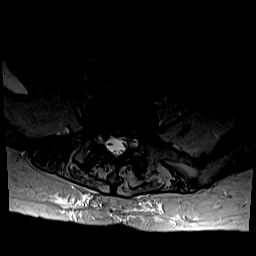
[im 12/36]
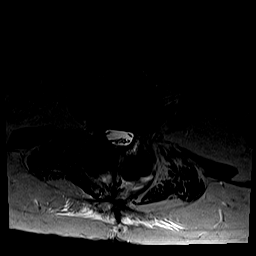
[im 16/36]
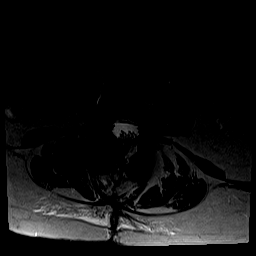
[im 20/36]
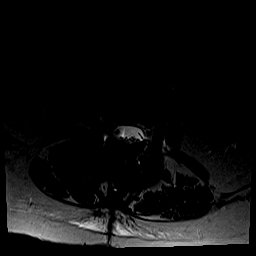
[im 24/36]
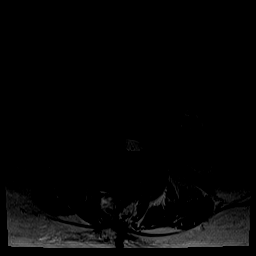
[im 32/36]
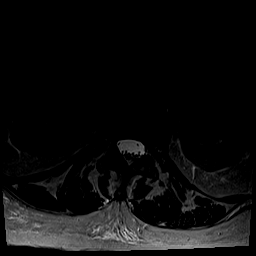
[im 36/36]
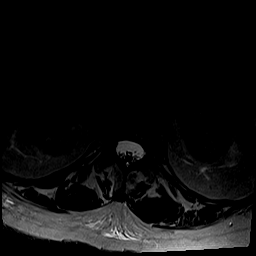

[Series 6: T1 · axial · 4.0mm · 0.39mm/px · z∈[-38,+144]mm · 7 of 36 slices shown (2 of 2)]
[im 1/36]
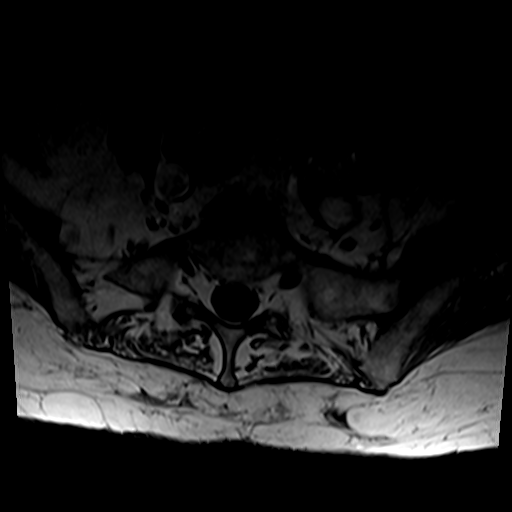
[im 4/36]
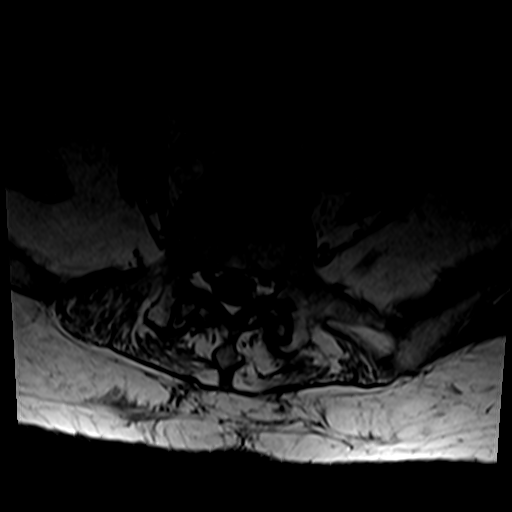
[im 12/36]
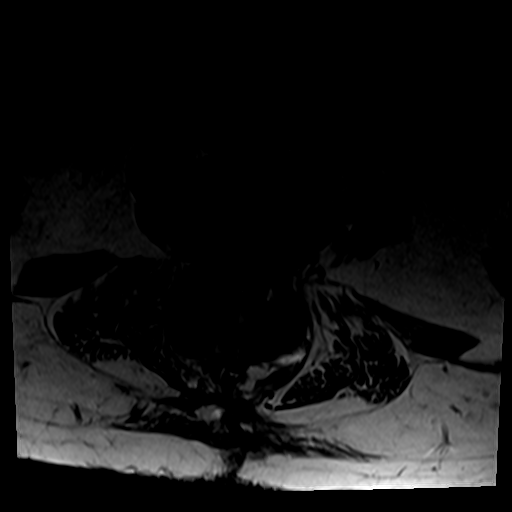
[im 16/36]
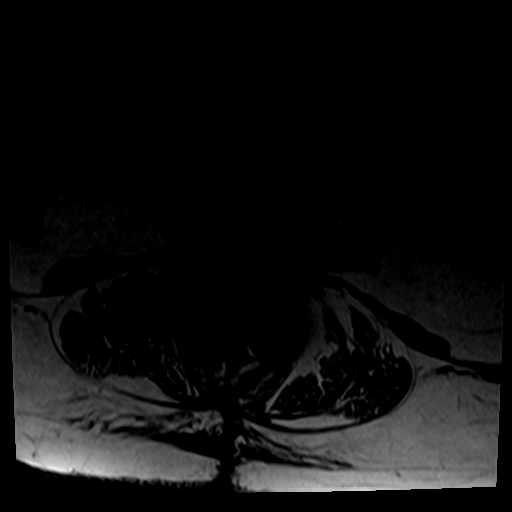
[im 20/36]
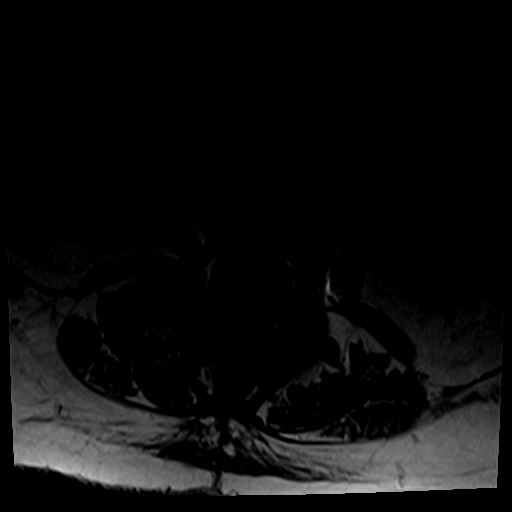
[im 24/36]
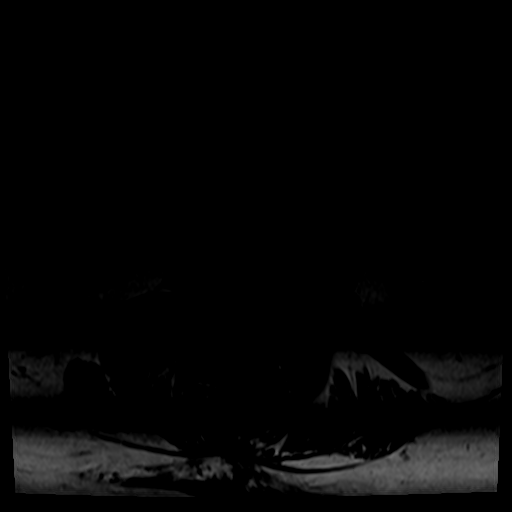
[im 32/36]
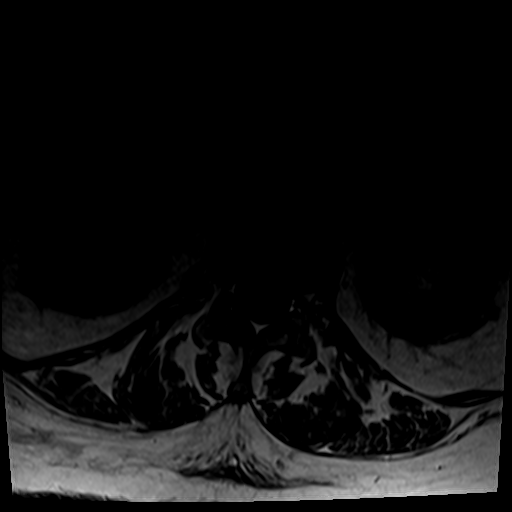

[Series 7: T2 · sagittal · 4.0mm · 1.02mm/px · 4 of 15 slices shown (2 of 2)]
[im 1/15]
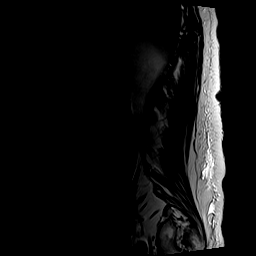
[im 5/15]
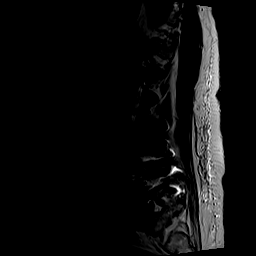
[im 10/15]
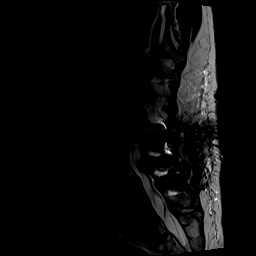
[im 15/15]
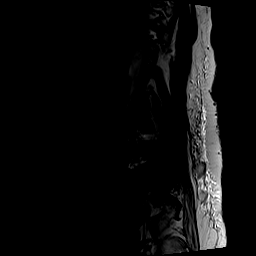

[24 of 48 positions shown; findings below may reference images not displayed]

FINDINGS: Segmentation:  5 lumbar type vertebrae

Alignment:  L4-5 and L5-S1 mild anterolisthesis, unchanged

Vertebrae: L3-4 and L4-5 PLIF. Marrow edema around the fractured
superior endplate of L5 with cage subsidence which appears similar
to before. No discitis or aggressive bone lesion.

Conus medullaris and cauda equina: Conus extends to the L1-2 level.
Conus and cauda equina appear normal.

Paraspinal and other soft tissues: Expected postoperative scarring
in fatty muscular atrophy. Prominent bilateral psoas atrophy.

Disc levels:

T11-12: Notable right-sided disc narrowing and endplate degeneration
with bulge causing moderate right foraminal stenosis.

T12- L1: Unremarkable.

L1-L2: Unremarkable.

L2-L3: Mild foraminal predominant disc bulging.  Mild facet spurring

L3-L4: PLIF and decompression. Hypointensity at the right foramen
which could affect the right L3 nerve root, likely disc material
based on prior CT. Patent spinal canal.

L4-L5: PLIF complicated by L5 cage subsidence with marrow edema
noted above. Residual disc is bulging into the bilateral foramina
with moderate nerve root encroachment on the right. Preserved
perineural fat at the left foramen by axial T1 weighted imaging.

L5-S1:Disc narrowing and bulging with left foraminal protrusion.
Degenerative facet spurring with small joint effusions on both
sides. Noncompressive left foraminal narrowing based on axial T1
weighted imaging.
IMPRESSION: 1. L4-5 PLIF complicated by known cage subsidence into the fractured
superior L5 endplate. Continued marrow edema at L5 from incomplete
healing. Moderate right foraminal narrowing.
2. L3-4 right foraminal disc material which could affect the right
L3 nerve root.
3. Diffusely patent spinal canal.

## 2021-09-27 MED ORDER — GADOBUTROL 1 MMOL/ML IV SOLN
7.5000 mL | Freq: Once | INTRAVENOUS | Status: AC | PRN
Start: 2021-09-27 — End: 2021-09-27
  Administered 2021-09-27: 7.5 mL via INTRAVENOUS
  Filled 2021-09-27: qty 7.5

## 2021-10-03 LAB — BASIC METABOLIC PANEL
BUN/Creatinine Ratio: 14 (ref 12–28)
BUN: 16 mg/dL (ref 8–27)
CO2: 15 mmol/L — ABNORMAL LOW (ref 20–29)
Calcium: 10.4 mg/dL — ABNORMAL HIGH (ref 8.7–10.3)
Chloride: 107 mmol/L — ABNORMAL HIGH (ref 96–106)
Creatinine, Ser: 1.11 mg/dL — ABNORMAL HIGH (ref 0.57–1.00)
Glucose: 142 mg/dL — ABNORMAL HIGH (ref 70–99)
Potassium: 4.1 mmol/L (ref 3.5–5.2)
Sodium: 142 mmol/L (ref 134–144)
eGFR: 51 mL/min/{1.73_m2} — ABNORMAL LOW (ref >59)

## 2021-10-11 ENCOUNTER — Telehealth: Payer: Self-pay | Admitting: Cardiovascular Disease

## 2021-10-11 NOTE — Telephone Encounter (Signed)
STAT if HR is under 50 or over 120 (normal HR is 60-100 beats per minute)  What is your heart rate? N/a  Do you have a log of your heart rate readings (document readings)? 80  Bp122/70 no distress  Do you have any other symptoms? None   Jim from [facility] reports that pt was at PT earlier and had an irregular HR. He states that she denies any symptoms.

## 2021-10-11 NOTE — Telephone Encounter (Signed)
Returned call to Clair Gulling with [facility] no answer.Left message to call back.

## 2021-10-17 NOTE — Telephone Encounter (Signed)
Patient has a hx of atrial fib

## 2021-10-22 NOTE — Telephone Encounter (Signed)
Can recheck; with AF can change parameter to hold if HR < 54.  Pt was asymptomatic at last ov.  If HR remains low, decrease Toprol from 100 to 75 mg.

## 2021-10-23 NOTE — Telephone Encounter (Signed)
Called Beth with [facility], advised of message from [place] states they will try the new parameters and call back if they change dosage of Metoprolol. Thanks!

## 2021-12-03 ENCOUNTER — Other Ambulatory Visit: Payer: Self-pay | Admitting: Internal Medicine

## 2021-12-03 DIAGNOSIS — Z1231 Encounter for screening mammogram for malignant neoplasm of breast: Secondary | ICD-10-CM

## 2021-12-24 ENCOUNTER — Encounter (HOSPITAL_COMMUNITY): Payer: Self-pay

## 2021-12-24 ENCOUNTER — Emergency Department (HOSPITAL_COMMUNITY): Payer: Medicare PPO

## 2021-12-24 ENCOUNTER — Ambulatory Visit: Payer: Medicare PPO | Admitting: General Practice

## 2021-12-24 ENCOUNTER — Emergency Department (HOSPITAL_COMMUNITY)
Admission: EM | Admit: 2021-12-24 | Discharge: 2021-12-24 | Disposition: A | Discharge status: Home / Self Care | Payer: Medicare PPO | Attending: Emergency Medicine | Admitting: Emergency Medicine

## 2021-12-24 ENCOUNTER — Other Ambulatory Visit: Payer: Self-pay

## 2021-12-24 ENCOUNTER — Telehealth: Payer: Self-pay | Admitting: Cardiovascular Disease

## 2021-12-24 DIAGNOSIS — Z7901 Long term (current) use of anticoagulants: Secondary | ICD-10-CM | POA: Diagnosis not present

## 2021-12-24 DIAGNOSIS — I4891 Unspecified atrial fibrillation: Secondary | ICD-10-CM | POA: Insufficient documentation

## 2021-12-24 DIAGNOSIS — I2510 Atherosclerotic heart disease of native coronary artery without angina pectoris: Secondary | ICD-10-CM | POA: Diagnosis not present

## 2021-12-24 DIAGNOSIS — E876 Hypokalemia: Secondary | ICD-10-CM | POA: Insufficient documentation

## 2021-12-24 DIAGNOSIS — R519 Headache, unspecified: Secondary | ICD-10-CM | POA: Diagnosis present

## 2021-12-24 DIAGNOSIS — Z79899 Other long term (current) drug therapy: Secondary | ICD-10-CM | POA: Diagnosis not present

## 2021-12-24 DIAGNOSIS — I10 Essential (primary) hypertension: Secondary | ICD-10-CM | POA: Diagnosis not present

## 2021-12-24 DIAGNOSIS — Z7984 Long term (current) use of oral hypoglycemic drugs: Secondary | ICD-10-CM | POA: Insufficient documentation

## 2021-12-24 DIAGNOSIS — E119 Type 2 diabetes mellitus without complications: Secondary | ICD-10-CM | POA: Diagnosis not present

## 2021-12-24 LAB — CBC WITH DIFFERENTIAL/PLATELET
Abs Immature Granulocytes: 0.03 10*3/uL (ref 0.00–0.07)
Basophils Absolute: 0 10*3/uL (ref 0.0–0.1)
Basophils Relative: 0 %
Eosinophils Absolute: 0.1 10*3/uL (ref 0.0–0.5)
Eosinophils Relative: 1 %
HCT: 32.5 % — ABNORMAL LOW (ref 36.0–46.0)
Hemoglobin: 10.5 g/dL — ABNORMAL LOW (ref 12.0–15.0)
Immature Granulocytes: 0 %
Lymphocytes Relative: 27 %
Lymphs Abs: 2 10*3/uL (ref 0.7–4.0)
MCH: 29.7 pg (ref 26.0–34.0)
MCHC: 32.3 g/dL (ref 30.0–36.0)
MCV: 92.1 fL (ref 80.0–100.0)
Monocytes Absolute: 0.5 10*3/uL (ref 0.1–1.0)
Monocytes Relative: 7 %
Neutro Abs: 4.8 10*3/uL (ref 1.7–7.7)
Neutrophils Relative %: 65 %
Platelets: 314 10*3/uL (ref 150–400)
RBC: 3.53 MIL/uL — ABNORMAL LOW (ref 3.87–5.11)
RDW: 13.9 % (ref 11.5–15.5)
WBC: 7.4 10*3/uL (ref 4.0–10.5)
nRBC: 0 % (ref 0.0–0.2)

## 2021-12-24 LAB — COMPREHENSIVE METABOLIC PANEL
ALT: 18 U/L (ref 0–44)
AST: 18 U/L (ref 15–41)
Albumin: 3.5 g/dL (ref 3.5–5.0)
Alkaline Phosphatase: 52 U/L (ref 38–126)
Anion gap: 10 (ref 5–15)
BUN: 11 mg/dL (ref 8–23)
CO2: 22 mmol/L (ref 22–32)
Calcium: 9.7 mg/dL (ref 8.9–10.3)
Chloride: 109 mmol/L (ref 98–111)
Creatinine, Ser: 1.07 mg/dL — ABNORMAL HIGH (ref 0.44–1.00)
GFR, Estimated: 53 mL/min — ABNORMAL LOW (ref >60)
Glucose, Bld: 135 mg/dL — ABNORMAL HIGH (ref 70–99)
Potassium: 2.7 mmol/L — CL (ref 3.5–5.1)
Sodium: 141 mmol/L (ref 135–145)
Total Bilirubin: 1.1 mg/dL (ref 0.3–1.2)
Total Protein: 6.5 g/dL (ref 6.5–8.1)

## 2021-12-24 LAB — MAGNESIUM: Magnesium: 1.6 mg/dL — ABNORMAL LOW (ref 1.7–2.4)

## 2021-12-24 MED ORDER — AMLODIPINE BESYLATE 5 MG PO TABS
5.0000 mg | ORAL_TABLET | Freq: Once | ORAL | Status: AC
  Administered 2021-12-24: 5 mg via ORAL
  Filled 2021-12-24: qty 1

## 2021-12-24 MED ORDER — POTASSIUM CHLORIDE ER 10 MEQ PO TBCR: 40.0000 meq | EXTENDED_RELEASE_TABLET | Freq: Every day | ORAL | 0 refills | Status: DC

## 2021-12-24 MED ORDER — AMLODIPINE BESYLATE 5 MG PO TABS: 10.0000 mg | ORAL_TABLET | Freq: Once | ORAL | Status: DC

## 2021-12-24 MED ORDER — MAGNESIUM OXIDE -MG SUPPLEMENT 400 (240 MG) MG PO TABS
400.0000 mg | ORAL_TABLET | Freq: Once | ORAL | Status: AC
  Administered 2021-12-24: 400 mg via ORAL
  Filled 2021-12-24: qty 1

## 2021-12-24 MED ORDER — POTASSIUM CHLORIDE CRYS ER 20 MEQ PO TBCR
40.0000 meq | EXTENDED_RELEASE_TABLET | Freq: Once | ORAL | Status: AC
  Administered 2021-12-24: 40 meq via ORAL
  Filled 2021-12-24: qty 2

## 2021-12-24 MED ORDER — SODIUM CHLORIDE 0.9 % IV BOLUS: 1000.0000 mL | Freq: Once | INTRAVENOUS | Status: DC

## 2021-12-24 MED ORDER — SODIUM CHLORIDE 0.9 % IV BOLUS
1000.0000 mL | Freq: Once | INTRAVENOUS | Status: AC
  Administered 2021-12-24: 1000 mL via INTRAVENOUS

## 2021-12-24 MED ORDER — ACETAMINOPHEN 500 MG PO TABS
1000.0000 mg | ORAL_TABLET | Freq: Four times a day (QID) | ORAL | Status: DC | PRN
  Administered 2021-12-24: 1000 mg via ORAL
  Filled 2021-12-24: qty 2

## 2021-12-24 MED ORDER — AMLODIPINE BESYLATE 10 MG PO TABS: 10.0000 mg | ORAL_TABLET | Freq: Every day | ORAL | 0 refills | Status: DC

## 2021-12-24 MED ORDER — POTASSIUM CHLORIDE 10 MEQ/100ML IV SOLN
10.0000 meq | INTRAVENOUS | Status: DC
  Administered 2021-12-24 (×2): 10 meq via INTRAVENOUS
  Filled 2021-12-24 (×2): qty 100

## 2021-12-24 NOTE — Telephone Encounter (Signed)
Pt c/o BP issue: STAT if pt c/o blurred vision, one-sided weakness or slurred speech  1. What are your last 5 BP readings?  9/10  180/119 9/9  194/103 78 9/8 209/116 78 9/5 234/121  9/4 221/141 80 9/3 215/130 75 9/1 231/118 71  2. Are you having any other symptoms (ex. Dizziness, headache, blurred vision, passed out)? Patient has headache.  3. What is your BP issue? Husband called because of her elevated BP.

## 2021-12-24 NOTE — Telephone Encounter (Signed)
Per Coletta Memos, FNP-C, pt to go to the ER, cancel appt at 220pm.  Called pt's husband notified to go to the ER immediately. Verbalized understanding he will take her to the ER immediately. Will cancel appt.

## 2021-12-24 NOTE — Telephone Encounter (Signed)
Called pt's husband. Discussed her symptoms. According to him she just recently started c/o headaches, he compared her log to her symptoms so so wanted to reach out to the office and let us know what is going on. She was added to Jesse's schedule today to be seen. She does have an appt with Dr. Claiborne Billings next month. He will bring her bp log and medications to appt today.

## 2021-12-24 NOTE — ED Provider Notes (Signed)
I provided a substantive portion of the care of this patient.  I personally performed the entirety of the medical decision making for this encounter.  EKG Interpretation  Date/Time:  Monday December 24 2021 10:39:58 EDT Ventricular Rate:  62 PR Interval:  148 QRS Duration: 80 QT Interval:  420 QTC Calculation: 426 R Axis:   70 Text Interpretation: Poor data quality, interpretation may be adversely affected Sinus rhythm with Premature atrial complexes ST & T wave abnormality, consider inferior ischemia Abnormal ECG When compared with ECG of 14-Sep-2021 14:53, PREVIOUS ECG IS PRESENT No significant change since last tracing Confirmed by Lacretia Leigh ([id number]) on 12/24/2021 1:55:55 PM    76 year old female presents with occipital headache.  Patient has been out of her blood pressure medication.  Headache has been worse with position.  CT of head is negative.  Blood pressure improved.  Feels much better.  Neurological deficits.  Will discharge home   Lacretia Leigh, MD 12/24/21 1531

## 2021-12-24 NOTE — ED Provider Notes (Signed)
[facility] EMERGENCY DEPARTMENT Provider Note   CSN: [id number] Arrival date & time: 12/24/21  5009     History PMH:  CAD s/p stent in 2006, Atrial fibrillation on Eliquis, HTN, CVA, Diabetes Chief Complaint  Patient presents with   Headache   Hypertension    Debbie [name] is a 76 y.o. female. Pt complains of headache for 1 week.  Headache is in the posterior region of her head.  It has been constant.  It is a gradual process.  Headache is worse with positional changes.  She has also noticed that has been hypertensive this week with systolics over 381 at times.  She is normally on blood pressure medication she has been compliant with this.  She says that her blood pressure is not normally this high.  She denies any recent illness or any changes. Headaches is not usual for her. She does report being less hydrated than normal.   Her son reports that she is supposed to be on Amlodipine 10 mg daily, but for some reason, she stopped taking this about one month ago because nobody refilled it for her. They report the same thing regarding her potassium supplements. Last cardiology note does not mention anything about this being discontinued.   She has been regularly taking Olmesartan 20 mg daily, Metoprolol 100 mg daily,, Lasix 20 mg daily.  On Eliquis and Plavix. Supposed to be on Amlodipine and Potassium supplementation per medication list.   Denies speech changes, facial droop, numbness, weakness, chest pain, shortness of breath, abdominal pain, nausea, vomiting, dizziness, visual change.   Headache Hypertension Associated symptoms include headaches.       Home Medications Prior to Admission medications   Medication Sig Start Date End Date Taking? Authorizing Provider  amLODipine (NORVASC) 10 MG tablet Take 1 tablet (10 mg total) by mouth daily. 12/24/21 01/23/22 Yes Izzabell Klasen, Adora Fridge, PA-C  potassium chloride (KLOR-CON) 10 MEQ tablet Take 4 tablets (40 mEq total)  by mouth daily. 12/24/21  Yes Anieya Helman, Adora Fridge, PA-C  acetaminophen (TYLENOL) 500 MG tablet Take 2 tablets (1,000 mg total) by mouth every 8 (eight) hours as needed. 03/30/20   Christophe Louis, MD  apixaban (ELIQUIS) 5 MG TABS tablet Take 1 tablet (5 mg total) by mouth 2 (two) times daily. 09/17/21 03/16/22  Loel Dubonnet, NP  clopidogrel (PLAVIX) 75 MG tablet Take 1 tablet (75 mg total) by mouth daily with breakfast. 08/01/21 08/01/22  Pokhrel, Corrie Mckusick, MD  ezetimibe (ZETIA) 10 MG tablet Take 1 tablet by mouth once daily 06/15/21   Troy Sine, MD  furosemide (LASIX) 20 MG tablet Take 1 tablet by mouth once daily 06/15/21   Troy Sine, MD  metFORMIN (GLUCOPHAGE) 500 MG tablet Take 1 tablet (500 mg total) by mouth 2 (two) times daily. 08/02/21   Pokhrel, Corrie Mckusick, MD  metoprolol succinate (TOPROL-XL) 100 MG 24 hr tablet Take 1 tablet (100 mg total) by mouth daily. Take with or immediately following a meal. 09/17/21 09/12/22  Loel Dubonnet, NP  Multiple Vitamins-Minerals (CENTRUM SILVER ADULT 50+) TABS Take 1 tablet by mouth daily.    [provider]  MYRBETRIQ 50 MG TB24 tablet Take 50 mg by mouth daily. 04/12/21   [provider]  nitroGLYCERIN (NITROSTAT) 0.4 MG SL tablet Place 1 tablet (0.4 mg total) under the tongue every 5 (five) minutes as needed for chest pain. 04/05/21   Troy Sine, MD  olmesartan (BENICAR) 20 MG tablet Take 1 tablet (20  mg total) by mouth daily. 03/13/21   Troy Sine, MD  oxyCODONE (OXY IR/ROXICODONE) 5 MG immediate release tablet Take 1 tablet (5 mg total) by mouth every 6 (six) hours as needed for breakthrough pain. 08/05/21   Danford, Suann Larry, MD  polyethylene glycol (MIRALAX / GLYCOLAX) 17 g packet Take 17 g by mouth daily. 08/05/21   Danford, Suann Larry, MD  potassium chloride SA (KLOR-CON M) 20 MEQ tablet One po bid x 2 days, then one po once a day 09/14/21   Lajean Saver, MD  pregabalin (LYRICA) 25 MG capsule Take 1 capsule (25 mg total)  by mouth 2 (two) times daily. 08/05/21   Danford, Suann Larry, MD  rosuvastatin (CRESTOR) 5 MG tablet Take 5 mg by mouth daily. 03/29/21   [provider]      Allergies    Patient has no known allergies.    Review of Systems   Review of Systems  Neurological:  Positive for headaches.  All other systems reviewed and are negative.   Physical Exam Updated Vital Signs BP (!) 172/84   Pulse (!) 53   Temp 98.3 F (36.8 C)   Resp (!) 28   Ht '5\' 5"'$  (1.651 m)   Wt 78.9 kg   SpO2 91%   BMI 28.96 kg/m  Physical Exam Vitals and nursing note reviewed.  Constitutional:      General: She is not in acute distress.    Appearance: Normal appearance. She is well-developed. She is not ill-appearing, toxic-appearing or diaphoretic.  HENT:     Head: Normocephalic and atraumatic.     Nose: No nasal deformity.     Mouth/Throat:     Lips: Pink. No lesions.     Mouth: Mucous membranes are moist.     Pharynx: Oropharynx is clear.  Eyes:     General: Gaze aligned appropriately. No scleral icterus.       Right eye: No discharge.        Left eye: No discharge.     Extraocular Movements: Extraocular movements intact.     Conjunctiva/sclera: Conjunctivae normal.     Right eye: Right conjunctiva is not injected. No exudate or hemorrhage.    Left eye: Left conjunctiva is not injected. No exudate or hemorrhage.    Pupils: Pupils are equal, round, and reactive to light.  Cardiovascular:     Rate and Rhythm: Normal rate and regular rhythm.     Pulses:          Radial pulses are 2+ on the right side and 2+ on the left side.       Dorsalis pedis pulses are 2+ on the right side and 2+ on the left side.     Heart sounds: Normal heart sounds. No murmur heard.    No friction rub. No gallop. No S3 or S4 sounds.  Pulmonary:     Effort: Pulmonary effort is normal. No respiratory distress.     Breath sounds: Normal breath sounds. No stridor. No wheezing or rhonchi.  Abdominal:     General: There  is no distension.     Palpations: Abdomen is soft.     Tenderness: There is no abdominal tenderness. There is no guarding.  Musculoskeletal:     Right lower leg: No edema.     Left lower leg: No edema.  Skin:    General: Skin is warm and dry.  Neurological:     Mental Status: She is alert and oriented to person, place,  and time.     GCS: GCS eye subscore is 4. GCS verbal subscore is 5. GCS motor subscore is 6.     Comments: Alert and Oriented x 3 Speech clear with no aphasia Cranial Nerve testing - Visual Fields grossly intact - PERRLA. EOM intact. No Nystagmus - Facial Sensation grossly intact - No facial asymmetry - Uvula and Tongue Midline - Accessory Muscles intact Motor: - 5/5 motor strength in all four extremities.  Sensation: - Grossly intact in all four extremities.  Coordination:  - Gait without abnormality.   Psychiatric:        Mood and Affect: Mood normal.        Speech: Speech normal.        Behavior: Behavior normal. Behavior is cooperative.     ED Results / Procedures / Treatments   Labs (all labs ordered are listed, but only abnormal results are displayed) Labs Reviewed  COMPREHENSIVE METABOLIC PANEL - Abnormal; Notable for the following components:      Result Value   Potassium 2.7 (*)    Glucose, Bld 135 (*)    Creatinine, Ser 1.07 (*)    GFR, Estimated 53 (*)    All other components within normal limits  CBC WITH DIFFERENTIAL/PLATELET - Abnormal; Notable for the following components:   RBC 3.53 (*)    Hemoglobin 10.5 (*)    HCT 32.5 (*)    All other components within normal limits  MAGNESIUM - Abnormal; Notable for the following components:   Magnesium 1.6 (*)    All other components within normal limits   Radiology CT Head Wo Contrast  Result Date: 12/24/2021 CLINICAL DATA:  Headache. EXAM: CT HEAD WITHOUT CONTRAST TECHNIQUE: Contiguous axial images were obtained from the base of the skull through the vertex without intravenous contrast.  RADIATION DOSE REDUCTION: This exam was performed according to the departmental dose-optimization program which includes automated exposure control, adjustment of the mA and/or kV according to patient size and/or use of iterative reconstruction technique. COMPARISON:  CT head dated September 14, 2021. FINDINGS: Brain: No evidence of acute infarction, hemorrhage, hydrocephalus, extra-axial collection or mass lesion/mass effect. 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 Old left caudate lacunar infarct again noted. Vascular: Atherosclerotic vascular calcification of the carotid siphons. No hyperdense vessel. Skull: Negative for fracture or focal lesion. Sinuses/Orbits: No acute finding. Other: None. IMPRESSION: 1. No acute intracranial abnormality. 2. 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 Electronically Signed   By: Titus Dubin M.D.   On: 12/24/2021 12:22    Procedures Procedures  This patient was on telemetry or cardiac monitoring during their time in the ED.    Medications Ordered in ED Medications  acetaminophen (TYLENOL) tablet 1,000 mg (1,000 mg Oral Given 12/24/21 1241)  potassium chloride SA (KLOR-CON M) CR tablet 40 mEq (40 mEq Oral Given 12/24/21 1241)  sodium chloride 0.9 % bolus 1,000 mL (0 mLs Intravenous Stopped 12/24/21 1340)  amLODipine (NORVASC) tablet 5 mg (5 mg Oral Given 12/24/21 1307)  magnesium oxide (MAG-OX) tablet 400 mg (400 mg Oral Given 12/24/21 1447)    ED Course/ Medical Decision Making/ A&P Clinical Course as of 12/24/21 1611  Mon Dec 24, 2021  1234 She has been out of Amlodipine for about a month.  [GL]  3299 She was taken off of potassium supplement about a month ago.  [GL]    Clinical Course User Index [GL] Kennede Lusk, Adora Fridge, PA-C  Medical Decision Making Amount and/or Complexity of Data Reviewed Labs: ordered. Radiology: ordered.  Risk OTC drugs. Prescription drug management.    MDM  This is a 76 y.o. female who  presents to the ED with a one week posterior headache and elevated blood pressure The differential of this patient includes but is not limited to cranial hemorrhage, primary headache, Giant cell arteritis, hypertensive headache, masslike occupying lesion, electrolyte abnormality, dehydration, medication noncompliance  I personally ordered, reviewed, and interpreted all laboratory work and imaging and agree with radiologist interpretation. Results interpreted below:  Hgb 10.5, stable from baseline K 2.7 Glucose 135, no acidosis or AG Creat 1.07, stable from baseline CT head with no acute abnormalities  Assessment/Plan:  Patient is presenting with 1 week of elevated blood pressures with systolics over 379 as well as an associated headache.  She has had no neurological symptoms or other symptoms of endorgan damage.  She has stable vitals here and her blood pressure is around 175/93.  We obtained labs which revealed a potassium of 2.7, stable renal function, and CT head without any acute abnormalities.    Once I got to talk to patient's family member, we found out that she has not been taking her amlodipine and potassium supplementation for about 1 month due to the refills not getting filled.  I did chart review and do not see anywhere where these were specifically discontinued by provider and her most recent Cardiology note from over the summer states that she should have been on this.  I think that being off the amlodipine is likely was causing her breakthrough hypertension as well as her headaches.  Potassium is low today which we will replace here, but I do think she should continue to be on potassium supplements.  Potassium likely low due to being on Lasix therapy.  She does appear to be euvolemic today without any signs of hypervolemia.  Her blood pressure has remained with systolics less than 024, but still higher than her baseline.  She has no endorgan damage symptoms or laboratory findings.  We  will just restart her amlodipine and give her first dose her today here. Treating headache with Tylenol and IVF which resolved her pain. Plan to discharge here in stable condition.    Charting Requirements Additional history is obtained from:  Independent historian External Records from outside source obtained and reviewed including: Reviewed recent telephone note with BP log and telling her to be sent to the ED Social Determinants of Health:  none Pertinant PMH that complicates patient's illness: HTN, on blood thinners  Patient Care Problems that were addressed during this visit: - Acute Headache: Acute illness with complication - Hypokalemia: Acute illness with complication - HTN: Acute illness with complication This patient was maintained on a cardiac monitor/telemetry. I personally viewed and interpreted the cardiac monitor which reveals an underlying rhythm of NSR Medications given in ED: Tylenol, Kcl infusion and PO, IVF bolus, Mag Ox, Norvasc Reevaluation of the patient after these medicines showed that the patient resolved I have reviewed home medications and made changes accordingly.  Critical Care Interventions: n/a Consultations: n/a Disposition: discharge  This is a shared visit with my attending physician, Dr. Zenia Resides.  We have discussed this patient and they have independently evaluated this patient. The plan was altered or changed as needed.  Portions of this note were generated with [employer]. Dictation errors may occur despite best attempts at proofreading.      Final Clinical Impression(s) / ED Diagnoses  Final diagnoses:  Acute nonintractable headache, unspecified headache type  Hypokalemia  Primary hypertension    Rx / DC Orders ED Discharge Orders          Ordered    amLODipine (NORVASC) 10 MG tablet  Daily        12/24/21 1439    potassium chloride (KLOR-CON) 10 MEQ tablet  Daily        12/24/21 1439              Joelle Roswell, Adora Fridge,  PA-C 12/24/21 1611    Lacretia Leigh, MD 12/26/21 [phone]

## 2021-12-24 NOTE — Discharge Instructions (Addendum)
Please start taking your Amlodipine and Potassium supplement tomorrow.  Please call Dr. Nancy Fetter to schedule an ED follow up visit and retake your blood pressure to see how you are doing with restarting the Amlodipine.   Please return if you develop chest pain, shortness of breath, numbness or weakness of your extremities, or worsening symptoms.

## 2021-12-24 NOTE — ED Provider Triage Note (Signed)
Emergency Medicine Provider Triage Evaluation Note  Debbie [name] , a 76 y.o. female  was evaluated in triage.  Pt complains of headache for 1 week.  Headache is in the occipital region of her head.  Is been constant.  It is a gradual process.  Headache is worse with positional changes.  She is also noticed has been hypertensive this week with systolics over 161.  She is normally on blood pressure medication she has been compliant with this.  She says that her blood pressure is not normally this high.  She denies any recent illness or any changes..  Denies any vision deficits, speech changes, numbness or weakness, fevers.  Review of Systems  Positive:  Negative:   Physical Exam  BP (!) 175/93   Pulse 65   Temp 99.1 F (37.3 C) (Oral)   Resp 18   Ht '5\' 5"'$  (1.651 m)   Wt 78.9 kg   SpO2 96%   BMI 28.96 kg/m  Gen:   Awake, no distress   Resp:  Normal effort  MSK:   Moves extremities without difficulty  Other:  Pupils round equal and reactive, EOMs are intact.  Equal strength in all extremities. No temporal tenderness  Medical Decision Making  Medically screening exam initiated at 10:27 AM.  Appropriate orders placed.  Leanna Sato was informed that the remainder of the evaluation will be completed by another provider, this initial triage assessment does not replace that evaluation, and the importance of remaining in the ED until their evaluation is complete.     Adolphus Birchwood, PA-C 12/24/21 1028

## 2021-12-24 NOTE — ED Triage Notes (Signed)
Patient complains of HTN and headache x 1 week.  Denies unilateral weakness.  No slurred speech or facial droop.

## 2022-01-07 ENCOUNTER — Ambulatory Visit: Payer: Medicare PPO

## 2022-01-31 ENCOUNTER — Ambulatory Visit: Payer: Medicare Other | Attending: Cardiovascular Disease | Admitting: Cardiovascular Disease

## 2022-01-31 ENCOUNTER — Encounter: Payer: Self-pay | Admitting: Cardiovascular Disease

## 2022-01-31 VITALS — BP 132/80 | HR 55 | Ht 65.0 in | Wt 163.4 lb

## 2022-01-31 DIAGNOSIS — Z7901 Long term (current) use of anticoagulants: Secondary | ICD-10-CM

## 2022-01-31 DIAGNOSIS — Z9861 Coronary angioplasty status: Secondary | ICD-10-CM

## 2022-01-31 DIAGNOSIS — G8929 Other chronic pain: Secondary | ICD-10-CM

## 2022-01-31 DIAGNOSIS — E785 Hyperlipidemia, unspecified: Secondary | ICD-10-CM

## 2022-01-31 DIAGNOSIS — M545 Low back pain, unspecified: Secondary | ICD-10-CM

## 2022-01-31 DIAGNOSIS — I2510 Atherosclerotic heart disease of native coronary artery without angina pectoris: Secondary | ICD-10-CM

## 2022-01-31 DIAGNOSIS — I25118 Atherosclerotic heart disease of native coronary artery with other forms of angina pectoris: Secondary | ICD-10-CM | POA: Diagnosis not present

## 2022-01-31 DIAGNOSIS — I10 Essential (primary) hypertension: Secondary | ICD-10-CM | POA: Diagnosis not present

## 2022-01-31 DIAGNOSIS — I480 Paroxysmal atrial fibrillation: Secondary | ICD-10-CM | POA: Diagnosis not present

## 2022-01-31 MED ORDER — OLMESARTAN MEDOXOMIL 20 MG PO TABS: 30.0000 mg | ORAL_TABLET | Freq: Every day | ORAL | 11 refills | Status: DC

## 2022-01-31 NOTE — Progress Notes (Signed)
Patient ID: Debbie [name], female   DOB: [birth year], 76 y.o.   MRN: [id number]     PCP: Dr. [facility]  HPI: Debbie [name], is a 76 y.o. female who presents to the office today for an 11 month followup evaluation.  Debbie [name] has known CAD and in 2001 suffered an acute coronary syndrome and was found to have total occlusion of her RCA. She had 3 stents placed to her RCA. Subsequent catheterization in 2006 showed all 3 stents widely patent. Additionally, she has a history of hypertension as well as hyperlipidemia. She also has a history of atrial tachycardia. A nuclear perfusion study in September 2013 showed minimal anterior thinning not felt to be significant.  Because of mild possible claudication symptoms her lower extremity, she underwent a lower extremity arterial Doppler study which suggested mild arterial insufficiency with ABIs of 0.83 bilaterally.  When I  saw her in September 2014 laboratory suggested that she had evolved into type 2 diabetes mellitus. At that time her hemoglobin A1c was 7.0 and her fasting blood sugar was 128. She had normal renal function. She subsequently saw Dr. Lou Miner who did not initiate medical therapy but to pursue aggressive dietary adjustments initially.   She was evaluated in September 2017 by Kerin Ransom after experiencing left-sided chest pressure which was not exertional and oftentimes occurring at the night.  It did not radiate to her arms, jaw, or back.  She denied associated nausea, vomiting or diaphoresis.  She was referred for nuclear perfusion study which was done on 01/19/2016.  This was a normal study.  Ejection fraction was 58%.  She underwent blood work time and on her dose of Lipitor 80 mg and Zetia 10 mg total cholesterol was 150, triglycerides 110, HDL 64, and LDL 64. There was very minimal renal insufficiency with a creatinine of 1.14.  Her fasting glucose was 150.    I saw her in November 2018 at which time she was remaining  stable and denied chest pain PND orthopnea presyncope or syncope.   I saw her on March 26, 2018 at which time she was continuing to do well and new to be on combination amlodipine/atorvastatin 10/80 in addition to Zetia 10 mg, HCTZ 25 mg, Toprol-XL 50 mg, and ramipril 10 mg daily for hypertension as well as hyperlipidemia.  At that time there was no chest pain or shortness of breath.   I evaluated her on April 08, 2019.  Over the prior year her blood pressure remained fairly stable and she continued to be without recurrent anginal symptomatology.  She started to notice swelling of her ankles bilaterally despite taking HCTZ 25 mg daily.  She continues to be on Caduet 10/80, HCTZ 25 mg daily, and ramipril 10 mg.  She is on Zetia 10 mg for hyperlipidemia.  There was no chest pain PND orthopnea, presyncope or syncope and was unaware of palpitations.  I discussed the importance of increased weight loss and exercise.  I last saw her on March 13, 2021.She denied any chestain. .  She sees Dr. Ebony Hail at [facility].  She is scheduled to undergo MRI of her lumbar spine by Dr. Ronnald Ramp.  She has been evaluated by Dr. Nelva Bush for leg discomfort.  She had undergone a robotic assisted laparoscopic hysterectomy with bilateral salpingo-oophorectomy in December 2021 for an ovarian mass.  Presently, she denies chest pain or shortness of breath.  She had some blood pressure lability.  During that evaluation, I suggested  changing her from ACE inhibition to ARB therapy and discontinued ramipril and started olmesartan 20 mg daily.  I recommended she monitor her blood pressure closely with potential further titration to 40 mg.  Since I last saw her, she has undergone several additional evaluations and admissions.  On July 02, 2021 she underwent posterior lumbar interbody fusion at L3-4, L4-5.  On March 27 through July 10, 2021 she was admitted with HFp EF exacerbation felt secondary to fluid received during surgery.   An echocardiogram revealed preserved LV function.  On July 25, 2021 she presented to the ER with atrial fibrillation and was started on Eliquis 5 mg twice a day and Toprol was increased to 100 mg.  Magnesium and potassium were repleted.  On April 16 through August 11, 2021 she presented with chest pain that resolved with nitroglycerin and underwent repeat cardiac catheterization on July 31, 2021 which showed 80% proximal RCA stenosis between the previously placed proximal and mid stent with in-stent restenosis in the mid stent and she had a patent distal third RCA stent.  She received a new 2.5 x 18 mm Onyx Frontier stent in the RCA.  It was recommended she continue triple therapy for 1 month with aspirin/Plavix/Eliquis and then discontinue aspirin.  On April 05 August 2021 she presented with leg pain and weakness and she was felt to have true radiculopathy and was discharged with a back brace and Lyrica with follow-up neurosurgery evaluation.  On September 14, 2021 she had a mechanical fall and hit her head and was evaluated in the ER.  Head CT was unremarkable.  She was last evaluated by Laurann Montana, NP in the office on September 17, 2021 and felt improved.  She was using her walker at home.  She denied chest pain shortness of breath dizziness or palpitations.  Stamina was gradually improving.  Presently, she continues to experience low back discomfort.  She denies any chest pain.  She states her blood pressure at home has been in the 923 systolic.  She continues to be on amlodipine 10 mg, furosemide 20 mg, metoprolol succinate 100 mg daily and olmesartan 20 mg for hypertension.  She is on Eliquis and Plavix following her stent.  She continues to be on rosuvastatin at 5 mg for hyperlipidemia.  She is on Lyrica for her neuropathy and takes Myrbetriq for bladder issues.  She presents for evaluation.  Past Medical History:  Diagnosis Date   Arthritis    CAD (coronary artery disease) 11/1999   RCA stent X3, patent  '06. Nuc low risk 9/13   Cataract    beginning stages   Diabetes mellitus without complication ([place])    Dysrhythmia    History of stress test 01/08/2012   Showed minimal anterior thinning not felt to be significant.   Hx of echocardiogram 12/12/2011   EF>55%   Hypercholesteremia    Hypertension    Myocardial infarction [facility])    18 years ago    age 84   PAF (paroxysmal atrial fibrillation) ([place]) 11/2011   SSS component with some bradycardia   Stroke ([place]) 1998   Vitamin D deficiency     Past Surgical History:  Procedure Laterality Date   ABDOMINAL HYSTERECTOMY     CATARACT EXTRACTION, BILATERAL     CHOLECYSTECTOMY     COLONOSCOPY     CORONARY ANGIOGRAM  08/2004   patent stents   CORONARY ANGIOPLASTY WITH STENT PLACEMENT  11/1999   X 3   CORONARY STENT INTERVENTION N/A 07/31/2021  Procedure: CORONARY STENT INTERVENTION;  Surgeon: Troy Sine, MD;  Location: [place] [facility];  Service: Cardiovascular;  Laterality: N/A;   EYE SURGERY     LEFT HEART CATH AND CORONARY ANGIOGRAPHY N/A 07/31/2021   Procedure: LEFT HEART CATH AND CORONARY ANGIOGRAPHY;  Surgeon: Troy Sine, MD;  Location: [place] [facility];  Service: Cardiovascular;  Laterality: N/A;   MULTIPLE TOOTH EXTRACTIONS     ROBOTIC ASSISTED TOTAL HYSTERECTOMY WITH BILATERAL SALPINGO OOPHERECTOMY Bilateral 03/29/2020   Procedure: XI ROBOTIC ASSISTED TOTAL HYSTERECTOMY WITH BILATERAL SALPINGO OOPHORECTOMY;  Surgeon: Christophe Louis, MD;  Location: [place];  Service: Gynecology;  Laterality: Bilateral;  Tracie to RNFA confirmed on 02/16/20 CS    No Known Allergies  Current Outpatient Medications  Medication Sig Dispense Refill   acetaminophen (TYLENOL) 500 MG tablet Take 2 tablets (1,000 mg total) by mouth every 8 (eight) hours as needed. 30 tablet 0   amLODipine (NORVASC) 10 MG tablet Take 1 tablet (10 mg total) by mouth daily. 30 tablet 0   apixaban (ELIQUIS) 5 MG TABS tablet Take 1 tablet (5 mg total) by mouth 2 (two)  times daily. 60 tablet 5   clopidogrel (PLAVIX) 75 MG tablet Take 1 tablet (75 mg total) by mouth daily with breakfast. 30 tablet 11   furosemide (LASIX) 20 MG tablet Take 1 tablet by mouth once daily 90 tablet 3   metFORMIN (GLUCOPHAGE) 500 MG tablet Take 1 tablet (500 mg total) by mouth 2 (two) times daily.     metoprolol succinate (TOPROL-XL) 100 MG 24 hr tablet Take 1 tablet (100 mg total) by mouth daily. Take with or immediately following a meal. 90 tablet 3   Multiple Vitamins-Minerals (CENTRUM SILVER ADULT 50+) TABS Take 1 tablet by mouth daily.     MYRBETRIQ 50 MG TB24 tablet Take 50 mg by mouth daily.     nitroGLYCERIN (NITROSTAT) 0.4 MG SL tablet Place 1 tablet (0.4 mg total) under the tongue every 5 (five) minutes as needed for chest pain. 25 tablet 3   oxyCODONE (OXY IR/ROXICODONE) 5 MG immediate release tablet Take 1 tablet (5 mg total) by mouth every 6 (six) hours as needed for breakthrough pain. 15 tablet 0   polyethylene glycol (MIRALAX / GLYCOLAX) 17 g packet Take 17 g by mouth daily. 14 each 0   potassium chloride (KLOR-CON) 10 MEQ tablet Take 4 tablets (40 mEq total) by mouth daily. 30 tablet 0   potassium chloride SA (KLOR-CON M) 20 MEQ tablet One po bid x 2 days, then one po once a day 15 tablet 0   pregabalin (LYRICA) 25 MG capsule Take 1 capsule (25 mg total) by mouth 2 (two) times daily.     rosuvastatin (CRESTOR) 5 MG tablet Take 5 mg by mouth daily.     olmesartan (BENICAR) 20 MG tablet Take 1.5 tablets (30 mg total) by mouth daily. 30 tablet 11   No current facility-administered medications for this visit.    Socially she denies tobacco or alcohol. She does not routinely exercise.   ROS General: Negative; No fevers, chills, or night sweats;  HEENT: Negative; No changes in vision or hearing, sinus congestion, difficulty swallowing Pulmonary: Negative; No cough, wheezing, shortness of breath, hemoptysis Cardiovascular: Negative; No chest pain, presyncope, syncope,  palpitations GI: Negative; No nausea, vomiting, diarrhea, or abdominal pain GU: Negative; No dysuria, hematuria, or difficulty voiding Musculoskeletal: Negative; no myalgias, joint pain, or weakness Hematologic/Oncology: Negative; no easy bruising, bleeding Endocrine: Negative; no heat/cold intolerance;  no diabetes Neuro: Negative; no changes in balance, headaches Skin: Negative; No rashes or skin lesions Psychiatric: Negative; No behavioral problems, depression Sleep: Negative; No snoring, daytime sleepiness, hypersomnolence, bruxism, restless legs, hypnogognic hallucinations, no cataplexy Other comprehensive 14 point system review is negative.   PE BP 132/80   Pulse (!) 55   Ht _0  (1.651 m)   Wt 163 lb 6.4 oz (74.1 kg)   SpO2 97%   BMI 27.19 kg/m    Repeat blood pressure by me was elevated initially at 180/84 and on repeat 162/84.  Wt Readings from Last 3 Encounters:  01/31/22 163 lb 6.4 oz (74.1 kg)  12/24/21 174 lb (78.9 kg)  09/17/21 174 lb 6.4 oz (79.1 kg)   General: Alert, oriented, no distress.  Skin: normal turgor, no rashes, warm and dry HEENT: Normocephalic, atraumatic. Pupils equal round and reactive to light; sclera anicteric; extraocular muscles intact;  Nose without nasal septal hypertrophy Mouth/Parynx benign; Mallinpatti scale 3 Neck: No JVD, no carotid bruits; normal carotid upstroke Lungs: clear to ausculatation and percussion; no wheezing or rales Chest wall: without tenderness to palpitation Heart: PMI not displaced, RRR, s1 s2 normal, 1/6 systolic murmur, no diastolic murmur, no rubs, gallops, thrills, or heaves Abdomen: soft, nontender; no hepatosplenomehaly, BS+; abdominal aorta nontender and not dilated by palpation. Back: no CVA tenderness Pulses 2+ Musculoskeletal: full range of motion, normal strength, no joint deformities Extremities: no clubbing cyanosis or edema, Homan's sign negative  Neurologic: grossly nonfocal; Cranial nerves grossly  wnl Psychologic: Normal mood and affect    January 31, 2022 ECG (independently read by me): Sinus bradycardia at 55, PACs with atrial bigeminy  March 13, 2021 ECG (independently read by me):  Sinus bradycardia at 57; LVH, old IMI  December 2020 ECG (independently read by me): Sinus bradycardia 58 bpm with an isolated PVC.  Nonspecific T wave abnormality inferolaterally.  December 2019 ECG (independently read by me): Sinus bradycardia 59 bpm.  PAC.  November 2018 ECG (independently read by me): Sinus bradycardia 53 with sinus arrhythmia.  Small inferior Q waves.  November 2017 ECG (independently read by me): Sinus bradycardia at 53 with mild sinus arrhythmia.  Nonspecific T changes.  September 2016 ECG (independently read by me): Sinus bradycardia 55 bpm.  No significant ST-T changes.  March 2015 ECG (independentlyby me): Sinus bradycardia 55 beats per minute. No ectopy.  Prior September,16 2014 ECG: Sinus bradycardia 48 beats per minute. Intervals normal  LABS:    Latest Ref Rng & Units 12/24/2021   10:34 AM 10/02/2021    8:03 AM 09/14/2021    3:14 PM  BMP  Glucose 70 - 99 mg/dL 135  142  116   BUN 8 - 23 mg/dL _1 Creatinine 0.44 - 1.00 mg/dL 1.07  1.11  1.05   BUN/Creat Ratio 12 - 28  14    Sodium 135 - 145 mmol/L 141  142  145   Potassium 3.5 - 5.1 mmol/L 2.7  4.1  3.0   Chloride 98 - 111 mmol/L 109  107  111   CO2 22 - 32 mmol/L _2 Calcium 8.9 - 10.3 mg/dL 9.7  10.4  9.5       Latest Ref Rng & Units 12/24/2021   10:34 AM 09/14/2021    3:14 PM 08/02/2021    9:12 PM  Hepatic Function  Total Protein 6.5 - 8.1 g/dL 6.5  6.2  6.9   Albumin  3.5 - 5.0 g/dL 3.5  3.6  4.0   AST 15 - 41 U/L 18  21  34   ALT 0 - 44 U/L _0 Alk Phosphatase 38 - 126 U/L 52  48  119   Total Bilirubin 0.3 - 1.2 mg/dL 1.1  0.6  0.9       Latest Ref Rng & Units 12/24/2021   10:34 AM 09/14/2021    3:14 PM 08/03/2021    3:41 AM  CBC  WBC 4.0 - 10.5 K/uL 7.4  5.8  7.2    Hemoglobin 12.0 - 15.0 g/dL 10.5  10.6  10.2   Hematocrit 36.0 - 46.0 % 32.5  32.7  31.9   Platelets 150 - 400 K/uL 314  352  251    Lab Results  Component Value Date   MCV 92.1 12/24/2021   MCV 92.9 09/14/2021   MCV 95.8 08/03/2021   Lab Results  Component Value Date   TSH 1.401 07/25/2021   Lab Results  Component Value Date   HGBA1C 7.1 (H) 07/02/2021   Lipid Panel     Component Value Date/Time   CHOL 110 07/30/2021 0508   CHOL 143 06/29/2021 0819   TRIG 96 07/30/2021 0508   HDL 44 07/30/2021 0508   HDL 62 06/29/2021 0819   CHOLHDL 2.5 07/30/2021 0508   VLDL 19 07/30/2021 0508   LDLCALC 47 07/30/2021 0508   LDLCALC 63 06/29/2021 0819   RADIOLOGY: No results found.  IMPRESSION:  1. Essential hypertension   2. Coronary artery disease of native artery of native heart with stable angina pectoris ([place])   3. CAD S/P percutaneous coronary angioplasty   4. PAF (paroxysmal atrial fibrillation) ([place])   5. Hyperlipidemia LDL goal <70   6. Chronic low back pain, unspecified back pain laterality, unspecified whether sciatica present   7. Anticoagulation adequate     ASSESSMENT AND PLAN: Ms. Wix is a 76 year old female who is 21 years (2001) following her initial multi lesion intervention to her RCA in the setting of an ACS at which time she was found to have total RCA occlusion and had successful insertion of 3 stents.  On follow-up catheterization in 2006 all stents were widely patent.  Over the past 2 decades she has been aggressively treated with lipid management and has been on atorvastatin 80 mg in addition to Zetia 10 mg.  Lipid studies in December 2019 showed a total cholesterol 154, HDL 62 LDL 71 triglycerides 103.  When I last saw her in 2020, she was having some mild edema despite taking HCTZ and at that time was given a prescription for furosemide.  At her last office visit with me in November 2022 due to blood pressure elevation she was changed to ARB therapy from  ACE inhibition.  Since her evaluation, she has had multiple physician evaluations and hospital evaluations which are outlined in my HPI.  She had developed atrial fibrillation in April 2023 and also developed recurrent anginal symptomatology and underwent repeat catheterization by me on July 31, 2021.  She had normal LAD and left circumflex coronary arteries.  The RCA had a previously placed proximal stent but now had 80% stenosis between the proximal and mid stent with 50% narrowing within the mid stented segment and she had a patent distal third stent.  She underwent successful PCI of the 80% stenosis and PTCA of the mid in-stent renarrowing.  Presently, she is without anginal symptomatology.  She has  had issues with her low back for which she has been evaluated by Dr. Ronnald Ramp of neurosurgery.  Her blood pressure today was elevated on my evaluation.  Her ECG shows atrial bigeminy and her ventricular rate is in the 50s.  Presently I am recommending slight titration of olmesartan from 20 mg up to 30 mg for 2 weeks and if her systolic blood pressure is greater or equal to 140 then she will increase this to 40 mg.  She continues to be on Plavix/Eliquis following her recent stent.  She continues to be on amlodipine 10 mg furosemide 20 mg and metoprolol 100 mg daily in addition to olmesartan for blood pressure and her PAF history.  She is on rosuvastatin 5 mg for hyperlipidemia.  LDL cholesterol in April 2023 was excellent at 47.  I have recommended she have a follow-up evaluation with our APP in 3 months and see me in 6 months for follow-up Cardiologic evaluation.   Troy Sine, MD, [facility]  02/02/2022 11:55 AM

## 2022-01-31 NOTE — Patient Instructions (Addendum)
Medication Instructions:  INCREASE olmesartan to 30 mg (1.5 tablets) for the next 2 weeks. --if blood pressure is still >124 systolic (top number), increase to 2 tablets or 40 mg  (Please let us know so we can update your prescription)  *If you need a refill on your cardiac medications before your next appointment, please call your pharmacy*  Follow-Up: At [facility], you and your health needs are our priority.  As part of our continuing mission to provide you with exceptional heart care, we have created designated Provider Care Teams.  These Care Teams include your primary Cardiologist (physician) and Advanced Practice Providers (APPs -  Physician Assistants and Nurse Practitioners) who all work together to provide you with the care you need, when you need it.  We recommend signing up for the patient portal called "MyChart".  Sign up information is provided on this After Visit Summary.  MyChart is used to connect with patients for Virtual Visits (Telemedicine).  Patients are able to view lab/test results, encounter notes, upcoming appointments, etc.  Non-urgent messages can be sent to your provider as well.   To learn more about what you can do with MyChart, go to [email or web address].    Your next appointment:   3 month(s) with NP or PA 6 months with Dr. Claiborne Billings

## 2022-02-02 ENCOUNTER — Encounter: Payer: Self-pay | Admitting: Cardiovascular Disease

## 2022-02-04 ENCOUNTER — Ambulatory Visit
Admission: RE | Admit: 2022-02-04 | Discharge: 2022-02-04 | Disposition: A | Discharge status: Home / Self Care | Payer: Medicare PPO | Source: Ambulatory Visit | Attending: Internal Medicine | Admitting: Internal Medicine

## 2022-02-04 DIAGNOSIS — Z1231 Encounter for screening mammogram for malignant neoplasm of breast: Secondary | ICD-10-CM

## 2022-05-03 ENCOUNTER — Ambulatory Visit: Payer: Medicare Other | Admitting: Nurse Practitioner

## 2022-05-03 NOTE — Progress Notes (Deleted)
Office Visit    Patient Name: Debbie [name] Date of Encounter: 05/03/2022  Primary Care Provider:  Sandi Mariscal, MD Primary Cardiologist:  Shelva Majestic, MD  Chief Complaint    77 year old female with a history of CAD s/p DES x 3-RCA in 2003 s/p DES to o-pRCA, PTCA-mRCA (ISR) in 07/2021, paroxysmal atrial fibrillation on Eliquis, hypertension, hyperlipidemia, stroke, type 2 diabetes and chronic lower back pain who presents for follow-up related to CAD and atrial fibrillation.  Past Medical History    Past Medical History:  Diagnosis Date   Arthritis    CAD (coronary artery disease) 11/1999   RCA stent X3, patent '06. Nuc low risk 9/13   Cataract    beginning stages   Diabetes mellitus without complication ([place])    Dysrhythmia    History of stress test 01/08/2012   Showed minimal anterior thinning not felt to be significant.   Hx of echocardiogram 12/12/2011   EF>55%   Hypercholesteremia    Hypertension    Myocardial infarction [facility])    18 years ago    age 24   PAF (paroxysmal atrial fibrillation) ([place]) 11/2011   SSS component with some bradycardia   Stroke ([place]) 1998   Vitamin D deficiency    Past Surgical History:  Procedure Laterality Date   ABDOMINAL HYSTERECTOMY     CATARACT EXTRACTION, BILATERAL     CHOLECYSTECTOMY     COLONOSCOPY     CORONARY ANGIOGRAM  08/2004   patent stents   CORONARY ANGIOPLASTY WITH STENT PLACEMENT  11/1999   X 3   CORONARY STENT INTERVENTION N/A 08/07/21   Procedure: CORONARY STENT INTERVENTION;  Surgeon: Troy Sine, MD;  Location: [place] [facility];  Service: Cardiovascular;  Laterality: N/A;   EYE SURGERY     LEFT HEART CATH AND CORONARY ANGIOGRAPHY N/A 2021-08-07   Procedure: LEFT HEART CATH AND CORONARY ANGIOGRAPHY;  Surgeon: Troy Sine, MD;  Location: [place] [facility];  Service: Cardiovascular;  Laterality: N/A;   MULTIPLE TOOTH EXTRACTIONS     ROBOTIC ASSISTED TOTAL HYSTERECTOMY WITH BILATERAL SALPINGO OOPHERECTOMY  Bilateral 03/29/2020   Procedure: XI ROBOTIC ASSISTED TOTAL HYSTERECTOMY WITH BILATERAL SALPINGO OOPHORECTOMY;  Surgeon: Christophe Louis, MD;  Location: [place];  Service: Gynecology;  Laterality: Bilateral;  Tracie to RNFA confirmed on 02/16/20 CS    Allergies  No Known Allergies   Labs/Other Studies Reviewed    The following studies were reviewed today: [facility] 2021-08-07:     Mid RCA lesion is 50% stenosed.   Prox RCA lesion is 80% stenosed.   Previously placed Ost RCA to Prox RCA stent (unknown type) is  widely patent.   Previously placed Dist RCA stent (unknown type) is  widely patent.   A drug-eluting stent was successfully placed.   Post intervention, there is a 0% residual stenosis.   Post intervention, there is a 0% residual stenosis.   Post intervention, there is a 0% residual stenosis.   Normal normal LAD and left circumflex coronary arteries.   The right coronary artery had a previously placed patent proximal stent, 80% stenosis between the proximal and mid stent with 50% in-stent narrowing in the mid stent and a patent distal third stent.   LVEDP 17 mmHg.   Successful percutaneous coronary intervention to the RCA with insertion of a 2.5 x 18 mm Medtronic Onyx Frontier stent was dilated to 2.76 mm and PTCA of the mid in-stent 50 to 60% stenosis.   RECOMMENDATION: Recommend resumption of Eliquis  commencing tomorrow with initial triple drug therapy for 1 month followed by Plavix/Eliquis.  The patient presented with significant hypertension and will need optimal blood pressure control.  Aggressive lipid-lowering therapy with target LDL less than 70.   Echo 07/09/2021: IMPRESSIONS     1. Left ventricular ejection fraction, by estimation, is 55 to 60%. The  left ventricle has normal function. The left ventricle has no regional  wall motion abnormalities. Left ventricular diastolic function could not  be evaluated.   2. Right ventricular systolic function is normal. The right  ventricular  size is normal.   3. The mitral valve is normal in structure. Trivial mitral valve  regurgitation. No evidence of mitral stenosis.   4. The aortic valve is tricuspid. Aortic valve regurgitation is not  visualized. No aortic stenosis is present.   5. The inferior vena cava is normal in size with greater than 50%  respiratory variability, suggesting right atrial pressure of 3 mmHg.   Comparison(s): No prior Echocardiogram.  Recent Labs: 07/25/2021: B Natriuretic Peptide 242.1; TSH 1.401 12/24/2021: ALT 18; BUN 11; Creatinine, Ser 1.07; Hemoglobin 10.5; Magnesium 1.6; Platelets 314; Potassium 2.7; Sodium 141  Recent Lipid Panel    Component Value Date/Time   CHOL 110 07/30/2021 0508   CHOL 143 06/29/2021 0819   TRIG 96 07/30/2021 0508   HDL 44 07/30/2021 0508   HDL 62 06/29/2021 0819   CHOLHDL 2.5 07/30/2021 0508   VLDL 19 07/30/2021 0508   LDLCALC 47 07/30/2021 0508   LDLCALC 63 06/29/2021 0819    History of Present Illness    77 year old female with the above past medical history including CAD s/p DES x 3-RCA in 2003, s/p DES to o-pRCA, PTCA-mRCA (ISR) in 07/2021, paroxysmal atrial fibrillation in Eliquis, hypertension, hyperlipidemia, stroke, type 2 diabetes and chronic lower back pain.  Echocardiogram in 06/2021 showed EF 55 to 60%, normal LV function, no RWMA, normal LV function, no significant valvular abnormalities.  She was hospitalized in April 2023 in the setting of chest pain.  Repeat cardiac catheterization revealed 80% proximal RCA stenosis between the previously placed proximal, mid LAD stent s/p DES as well as ISR-mRCA stents s/p PTCA, patent dRCA stent.  She she was last seen in the office on 01/31/2022 and was stable from a cardiac standpoint.  Olmesartan was increased in setting of mildly elevated BP.  She presents today for follow-up.  Since her last visit  CAD: Paroxysmal atrial fibrillation: Hypertension: Hyperlipidemia: History of CVA: Type 2  diabetes: Disposition:  Home Medications    Current Outpatient Medications  Medication Sig Dispense Refill   acetaminophen (TYLENOL) 500 MG tablet Take 2 tablets (1,000 mg total) by mouth every 8 (eight) hours as needed. 30 tablet 0   amLODipine (NORVASC) 10 MG tablet Take 1 tablet (10 mg total) by mouth daily. 30 tablet 0   apixaban (ELIQUIS) 5 MG TABS tablet Take 1 tablet (5 mg total) by mouth 2 (two) times daily. 60 tablet 5   clopidogrel (PLAVIX) 75 MG tablet Take 1 tablet (75 mg total) by mouth daily with breakfast. 30 tablet 11   furosemide (LASIX) 20 MG tablet Take 1 tablet by mouth once daily 90 tablet 3   metFORMIN (GLUCOPHAGE) 500 MG tablet Take 1 tablet (500 mg total) by mouth 2 (two) times daily.     metoprolol succinate (TOPROL-XL) 100 MG 24 hr tablet Take 1 tablet (100 mg total) by mouth daily. Take with or immediately following a meal. 90 tablet 3  Multiple Vitamins-Minerals (CENTRUM SILVER ADULT 50+) TABS Take 1 tablet by mouth daily.     MYRBETRIQ 50 MG TB24 tablet Take 50 mg by mouth daily.     nitroGLYCERIN (NITROSTAT) 0.4 MG SL tablet Place 1 tablet (0.4 mg total) under the tongue every 5 (five) minutes as needed for chest pain. 25 tablet 3   olmesartan (BENICAR) 20 MG tablet Take 1.5 tablets (30 mg total) by mouth daily. 30 tablet 11   oxyCODONE (OXY IR/ROXICODONE) 5 MG immediate release tablet Take 1 tablet (5 mg total) by mouth every 6 (six) hours as needed for breakthrough pain. 15 tablet 0   polyethylene glycol (MIRALAX / GLYCOLAX) 17 g packet Take 17 g by mouth daily. 14 each 0   potassium chloride (KLOR-CON) 10 MEQ tablet Take 4 tablets (40 mEq total) by mouth daily. 30 tablet 0   potassium chloride SA (KLOR-CON M) 20 MEQ tablet One po bid x 2 days, then one po once a day 15 tablet 0   pregabalin (LYRICA) 25 MG capsule Take 1 capsule (25 mg total) by mouth 2 (two) times daily.     rosuvastatin (CRESTOR) 5 MG tablet Take 5 mg by mouth daily.     No current  facility-administered medications for this visit.     Review of Systems    ***.  All other systems reviewed and are otherwise negative except as noted above.    Physical Exam    VS:  There were no vitals taken for this visit. , BMI There is no height or weight on file to calculate BMI.     GEN: Well nourished, well developed, in no acute distress. HEENT: normal. Neck: Supple, no JVD, carotid bruits, or masses. Cardiac: RRR, no murmurs, rubs, or gallops. No clubbing, cyanosis, edema.  Radials/DP/PT 2+ and equal bilaterally.  Respiratory:  Respirations regular and unlabored, clear to auscultation bilaterally. GI: Soft, nontender, nondistended, BS + x 4. MS: no deformity or atrophy. Skin: warm and dry, no rash. Neuro:  Strength and sensation are intact. Psych: Normal affect.  Accessory Clinical Findings    ECG personally reviewed by me today - *** - no acute changes.   Lab Results  Component Value Date   WBC 7.4 12/24/2021   HGB 10.5 (L) 12/24/2021   HCT 32.5 (L) 12/24/2021   MCV 92.1 12/24/2021   PLT 314 12/24/2021   Lab Results  Component Value Date   CREATININE 1.07 (H) 12/24/2021   BUN 11 12/24/2021   NA 141 12/24/2021   K 2.7 (LL) 12/24/2021   CL 109 12/24/2021   CO2 22 12/24/2021   Lab Results  Component Value Date   ALT 18 12/24/2021   AST 18 12/24/2021   ALKPHOS 52 12/24/2021   BILITOT 1.1 12/24/2021   Lab Results  Component Value Date   CHOL 110 07/30/2021   HDL 44 07/30/2021   LDLCALC 47 07/30/2021   TRIG 96 07/30/2021   CHOLHDL 2.5 07/30/2021    Lab Results  Component Value Date   HGBA1C 7.1 (H) 07/02/2021    Assessment & Plan    1.  ***  No BP recorded.  {Refresh Note OR Click here to enter BP  :1}***   Lenna Sciara, NP 05/03/2022, 4:53 AM

## 2022-06-02 ENCOUNTER — Other Ambulatory Visit: Payer: Self-pay | Admitting: Cardiovascular Disease

## 2022-06-03 ENCOUNTER — Other Ambulatory Visit: Payer: Self-pay | Admitting: Cardiovascular Disease

## 2022-06-20 ENCOUNTER — Other Ambulatory Visit: Payer: Self-pay | Admitting: Cardiovascular Disease

## 2022-07-01 ENCOUNTER — Telehealth: Payer: Self-pay | Admitting: Cardiovascular Disease

## 2022-07-01 NOTE — Telephone Encounter (Signed)
*  STAT* If patient is at the pharmacy, call can be transferred to refill team.   1. Which medications need to be refilled? (please list name of each medication and dose if known)  olmesartan (BENICAR) 20 MG tablet   2. Which pharmacy/location (including street and city if local pharmacy) is medication to be sent to? [place], [place]   3. Do they need a 30 day or 90 day supply? 30 day

## 2022-07-02 MED ORDER — OLMESARTAN MEDOXOMIL 20 MG PO TABS: 20.0000 mg | ORAL_TABLET | Freq: Every day | ORAL | 0 refills | Status: DC

## 2022-07-04 ENCOUNTER — Other Ambulatory Visit: Payer: Self-pay | Admitting: Neurological Surgery

## 2022-07-04 DIAGNOSIS — S32009G Unspecified fracture of unspecified lumbar vertebra, subsequent encounter for fracture with delayed healing: Secondary | ICD-10-CM

## 2022-08-01 ENCOUNTER — Encounter: Payer: Self-pay | Admitting: Nurse Practitioner

## 2022-08-01 ENCOUNTER — Ambulatory Visit: Payer: Medicare Other | Attending: Nurse Practitioner | Admitting: Nurse Practitioner

## 2022-08-01 VITALS — BP 136/70 | HR 69 | Ht 65.0 in | Wt 170.0 lb

## 2022-08-01 DIAGNOSIS — E119 Type 2 diabetes mellitus without complications: Secondary | ICD-10-CM

## 2022-08-01 DIAGNOSIS — I480 Paroxysmal atrial fibrillation: Secondary | ICD-10-CM

## 2022-08-01 DIAGNOSIS — E785 Hyperlipidemia, unspecified: Secondary | ICD-10-CM

## 2022-08-01 DIAGNOSIS — I2510 Atherosclerotic heart disease of native coronary artery without angina pectoris: Secondary | ICD-10-CM

## 2022-08-01 DIAGNOSIS — Z8673 Personal history of transient ischemic attack (TIA), and cerebral infarction without residual deficits: Secondary | ICD-10-CM

## 2022-08-01 DIAGNOSIS — I10 Essential (primary) hypertension: Secondary | ICD-10-CM

## 2022-08-01 MED ORDER — ISOSORBIDE MONONITRATE ER 30 MG PO TB24: 15.0000 mg | ORAL_TABLET | Freq: Every day | ORAL | 3 refills | Status: DC

## 2022-08-01 MED ORDER — CLOPIDOGREL BISULFATE 75 MG PO TABS: 75.0000 mg | ORAL_TABLET | Freq: Every day | ORAL | 3 refills | Status: DC

## 2022-08-01 NOTE — Patient Instructions (Signed)
Medication Instructions:  Start Imdur 15 mg daily   *If you need a refill on your cardiac medications before your next appointment, please call your pharmacy*   Lab Work: Your physician recommends that you return at your convenience. CBC, Fasting Lipid panel, TSH, CMET, A1C   If you have labs (blood work) drawn today and your tests are completely normal, you will receive your results only by: MyChart Message (if you have MyChart) OR A paper copy in the mail If you have any lab test that is abnormal or we need to change your treatment, we will call you to review the results.   Testing/Procedures: NONE ordered at this time of appointment     Follow-Up: At [facility], you and your health needs are our priority.  As part of our continuing mission to provide you with exceptional heart care, we have created designated Provider Care Teams.  These Care Teams include your primary Cardiologist (physician) and Advanced Practice Providers (APPs -  Physician Assistants and Nurse Practitioners) who all work together to provide you with the care you need, when you need it.  We recommend signing up for the patient portal called "MyChart".  Sign up information is provided on this After Visit Summary.  MyChart is used to connect with patients for Virtual Visits (Telemedicine).  Patients are able to view lab/test results, encounter notes, upcoming appointments, etc.  Non-urgent messages can be sent to your provider as well.   To learn more about what you can do with MyChart, go to [email or web address].    Your next appointment:   6 month(s)  Provider:   Nicki Guadalajara, MD     Other Instructions

## 2022-08-01 NOTE — Progress Notes (Signed)
Office Visit    Patient Name: Debbie [name] Date of Encounter: 08/01/2022  Primary Care Provider:  Salli Real, MD Primary Cardiologist:  Nicki Guadalajara, MD  Chief Complaint    77 year old female with a history of CAD s/p DES x 3-RCA in 2003, s/p DES to o-pRCA, PTCA-mRCA (ISR) in 07/2021, paroxysmal atrial fibrillation in Eliquis, hypertension, hyperlipidemia, stroke, type 2 diabetes and chronic lower back pain who presents for follow-up related to CAD and hypertension.   Past Medical History    Past Medical History:  Diagnosis Date   Arthritis    CAD (coronary artery disease) 11/1999   RCA stent X3, patent '06. Nuc low risk 9/13   Cataract    beginning stages   Diabetes mellitus without complication    Dysrhythmia    History of stress test 01/08/2012   Showed minimal anterior thinning not felt to be significant.   Hx of echocardiogram 12/12/2011   EF>55%   Hypercholesteremia    Hypertension    Myocardial infarction    18 years ago    age 81   PAF (paroxysmal atrial fibrillation) 11/2011   SSS component with some bradycardia   Stroke 1998   Vitamin D deficiency    Past Surgical History:  Procedure Laterality Date   ABDOMINAL HYSTERECTOMY     CATARACT EXTRACTION, BILATERAL     CHOLECYSTECTOMY     COLONOSCOPY     CORONARY ANGIOGRAM  08/2004   patent stents   CORONARY ANGIOPLASTY WITH STENT PLACEMENT  11/1999   X 3   CORONARY STENT INTERVENTION N/A 07/31/2021   Procedure: CORONARY STENT INTERVENTION;  Surgeon: Lennette Bihari, MD;  Location: [facility];  Service: Cardiovascular;  Laterality: N/A;   EYE SURGERY     LEFT HEART CATH AND CORONARY ANGIOGRAPHY N/A 07/31/2021   Procedure: LEFT HEART CATH AND CORONARY ANGIOGRAPHY;  Surgeon: Lennette Bihari, MD;  Location: [facility];  Service: Cardiovascular;  Laterality: N/A;   MULTIPLE TOOTH EXTRACTIONS     ROBOTIC ASSISTED TOTAL HYSTERECTOMY WITH BILATERAL SALPINGO OOPHERECTOMY Bilateral 03/29/2020    Procedure: XI ROBOTIC ASSISTED TOTAL HYSTERECTOMY WITH BILATERAL SALPINGO OOPHORECTOMY;  Surgeon: Gerald Leitz, MD;  Location: [facility];  Service: Gynecology;  Laterality: Bilateral;  Tracie to RNFA confirmed on 02/16/20 CS    Allergies  No Known Allergies   Labs/Other Studies Reviewed    The following studies were reviewed today:  [facility] 07/31/2021:     Mid RCA lesion is 50% stenosed.   Prox RCA lesion is 80% stenosed.   Previously placed Ost RCA to Prox RCA stent (unknown type) is  widely patent.   Previously placed Dist RCA stent (unknown type) is  widely patent.   A drug-eluting stent was successfully placed.   Post intervention, there is a 0% residual stenosis.   Post intervention, there is a 0% residual stenosis.   Post intervention, there is a 0% residual stenosis.   Normal normal LAD and left circumflex coronary arteries.   The right coronary artery had a previously placed patent proximal stent, 80% stenosis between the proximal and mid stent with 50% in-stent narrowing in the mid stent and a patent distal third stent.   LVEDP 17 mmHg.   Successful percutaneous coronary intervention to the RCA with insertion of a 2.5 x 18 mm Medtronic Onyx Frontier stent was dilated to 2.76 mm and PTCA of the mid in-stent 50 to 60% stenosis.   RECOMMENDATION: Recommend resumption of Eliquis commencing tomorrow with  initial triple drug therapy for 1 month followed by Plavix/Eliquis.  The patient presented with significant hypertension and will need optimal blood pressure control.  Aggressive lipid-lowering therapy with target LDL less than 70.   Echo 07/09/2021: IMPRESSIONS     1. Left ventricular ejection fraction, by estimation, is 55 to 60%. The  left ventricle has normal function. The left ventricle has no regional  wall motion abnormalities. Left ventricular diastolic function could not  be evaluated.   2. Right ventricular systolic function is normal. The right ventricular  size is  normal.   3. The mitral valve is normal in structure. Trivial mitral valve  regurgitation. No evidence of mitral stenosis.   4. The aortic valve is tricuspid. Aortic valve regurgitation is not  visualized. No aortic stenosis is present.   5. The inferior vena cava is normal in size with greater than 50%  respiratory variability, suggesting right atrial pressure of 3 mmHg.   Comparison(s): No prior Echocardiogram.  Recent Labs: 12/24/2021: ALT 18; BUN 11; Creatinine, Ser 1.07; Hemoglobin 10.5; Magnesium 1.6; Platelets 314; Potassium 2.7; Sodium 141  Recent Lipid Panel    Component Value Date/Time   CHOL 110 07/30/2021 0508   CHOL 143 06/29/2021 0819   TRIG 96 07/30/2021 0508   HDL 44 07/30/2021 0508   HDL 62 06/29/2021 0819   CHOLHDL 2.5 07/30/2021 0508   VLDL 19 07/30/2021 0508   LDLCALC 47 07/30/2021 0508   LDLCALC 63 06/29/2021 0819    History of Present Illness   77 year old female with the above past medical history including CAD s/p DES x 3-RCA in 2003, s/p DES to o-pRCA, PTCA-mRCA (ISR) in 07/2021, paroxysmal atrial fibrillation on Eliquis, hypertension, hyperlipidemia, stroke, type 2 diabetes and chronic lower back pain.   Echocardiogram in 06/2021 showed EF 55 to 60%, normal LV function, no RWMA, normal LV function, no significant valvular abnormalities.  She was hospitalized in April 2023 in the setting of chest pain.  Repeat cardiac catheterization revealed 80% proximal RCA stenosis between the previously placed proximal, mid LAD stent s/p DES as well as ISR-mRCA stents s/p PTCA, patent dRCA stent.  She she was last seen in the office on 01/31/2022 and was stable from a cardiac standpoint.  Olmesartan was increased in setting of mildly elevated BP.   She presents today for follow-up.  Since her last visit she has been stable overall from a cardiac standpoint.  Her BP has been well-controlled with increased olmesartan (she is now taking 40 mg daily).  She notes she had an hour  long episode of chest pain while she was at church last week that resolved with nitroglycerin.  She denies any other symptoms of chest pain.  She denies palpitations, dizziness, presyncope, syncope.  She is searching for a new PCP.    Home Medications    Current Outpatient Medications  Medication Sig Dispense Refill   acetaminophen (TYLENOL) 500 MG tablet Take 2 tablets (1,000 mg total) by mouth every 8 (eight) hours as needed. 30 tablet 0   clopidogrel (PLAVIX) 75 MG tablet Take 1 tablet (75 mg total) by mouth daily with breakfast. 30 tablet 11   furosemide (LASIX) 20 MG tablet Take 1 tablet by mouth once daily 90 tablet 0   metFORMIN (GLUCOPHAGE) 500 MG tablet Take 1 tablet (500 mg total) by mouth 2 (two) times daily.     metoprolol succinate (TOPROL-XL) 100 MG 24 hr tablet Take 1 tablet (100 mg total) by mouth daily. Take with or  immediately following a meal. 90 tablet 3   Multiple Vitamins-Minerals (CENTRUM SILVER ADULT 50+) TABS Take 1 tablet by mouth daily.     MYRBETRIQ 50 MG TB24 tablet Take 50 mg by mouth daily.     nitroGLYCERIN (NITROSTAT) 0.4 MG SL tablet Place 1 tablet (0.4 mg total) under the tongue every 5 (five) minutes as needed for chest pain. 25 tablet 3   olmesartan (BENICAR) 20 MG tablet Take 1 tablet (20 mg total) by mouth daily. 30 tablet 0   oxyCODONE (OXY IR/ROXICODONE) 5 MG immediate release tablet Take 1 tablet (5 mg total) by mouth every 6 (six) hours as needed for breakthrough pain. 15 tablet 0   polyethylene glycol (MIRALAX / GLYCOLAX) 17 g packet Take 17 g by mouth daily. 14 each 0   potassium chloride (KLOR-CON) 10 MEQ tablet Take 4 tablets (40 mEq total) by mouth daily. 30 tablet 0   potassium chloride SA (KLOR-CON M) 20 MEQ tablet One po bid x 2 days, then one po once a day 15 tablet 0   pregabalin (LYRICA) 25 MG capsule Take 1 capsule (25 mg total) by mouth 2 (two) times daily.     rosuvastatin (CRESTOR) 5 MG tablet Take 5 mg by mouth daily.     amLODipine  (NORVASC) 10 MG tablet Take 1 tablet (10 mg total) by mouth daily. 30 tablet 0   apixaban (ELIQUIS) 5 MG TABS tablet Take 1 tablet (5 mg total) by mouth 2 (two) times daily. 60 tablet 5   No current facility-administered medications for this visit.     Review of Systems    She denies chest pain, palpitations, dyspnea, pnd, orthopnea, n, v, dizziness, syncope, edema, weight gain, or early satiety. All other systems reviewed and are otherwise negative except as noted above.   Physical Exam    VS:  BP 136/70   Pulse 69   Ht 5\' 5"  (1.651 m)   Wt 170 lb (77.1 kg)   BMI 28.29 kg/m   GEN: Well nourished, well developed, in no acute distress. HEENT: normal. Neck: Supple, no JVD, carotid bruits, or masses. Cardiac: RRR, no murmurs, rubs, or gallops. No clubbing, cyanosis, edema.  Radials/DP/PT 2+ and equal bilaterally.  Respiratory:  Respirations regular and unlabored, clear to auscultation bilaterally. GI: Soft, nontender, nondistended, BS + x 4. MS: no deformity or atrophy. Skin: warm and dry, no rash. Neuro:  Strength and sensation are intact. Psych: Normal affect.  Accessory Clinical Findings   ECG personally reviewed by me today -NSR, 64 bpm, PACs-no acute change.  Lab Results  Component Value Date   WBC 7.4 12/24/2021   HGB 10.5 (L) 12/24/2021   HCT 32.5 (L) 12/24/2021   MCV 92.1 12/24/2021   PLT 314 12/24/2021   Lab Results  Component Value Date   CREATININE 1.07 (H) 12/24/2021   BUN 11 12/24/2021   NA 141 12/24/2021   K 2.7 (LL) 12/24/2021   CL 109 12/24/2021   CO2 22 12/24/2021   Lab Results  Component Value Date   ALT 18 12/24/2021   AST 18 12/24/2021   ALKPHOS 52 12/24/2021   BILITOT 1.1 12/24/2021   Lab Results  Component Value Date   CHOL 110 07/30/2021   HDL 44 07/30/2021   LDLCALC 47 07/30/2021   TRIG 96 07/30/2021   CHOLHDL 2.5 07/30/2021    Lab Results  Component Value Date   HGBA1C 7.1 (H) 07/02/2021    Assessment & Plan    1. CAD:  S/p  DES x 3-RCA in 2003, s/p DES to o-pRCA, PTCA-mRCA (ISR) in 07/2021.   Recent  episode of chest pain that occurred while she was at church reportedly resolved with nitroglycerin.  She denies any further chest pain, denies any exertional symptoms.  Will start Imdur 15 mg daily given recent chest discomfort.  Discussed ED precautions.  Advised her to continue to monitor symptoms and notify us if she has more frequent symptoms concerning for angina.  No ASA in the setting of chronic DOAC therapy. Continue Plavix, amlodipine, metoprolol, olmesartan, and Crestor.  2. Paroxysmal atrial fibrillation: Maintaining sinus rhythm on exam today.  Will update CMET, CBC, TSH. Continue metoprolol, Eliquis.  3. Hypertension: BP well controlled. Continue current antihypertensive regimen.   4. Hyperlipidemia: LDL was 70 in 07/2021.  Will update fasting lipid panel, CMET.  Continue Crestor.  5. History of CVA: Continue Eliquis.  6. Type 2 diabetes: A1c was 7.1 in 06/2021.  Will update A1c as she is in the process of searching for a new PCP and is due for repeat labs.  7. Disposition: Follow-up in 6 months.     Joylene Grapes, NP 08/01/2022, 1:31 PM

## 2022-08-07 ENCOUNTER — Telehealth: Payer: Self-pay

## 2022-08-07 ENCOUNTER — Other Ambulatory Visit: Payer: Self-pay

## 2022-08-07 DIAGNOSIS — I10 Essential (primary) hypertension: Secondary | ICD-10-CM

## 2022-08-07 DIAGNOSIS — E785 Hyperlipidemia, unspecified: Secondary | ICD-10-CM

## 2022-08-07 LAB — COMPREHENSIVE METABOLIC PANEL
ALT: 12 IU/L (ref 0–32)
AST: 13 IU/L (ref 0–40)
Albumin/Globulin Ratio: 1.6 (ref 1.2–2.2)
Albumin: 4.1 g/dL (ref 3.8–4.8)
Alkaline Phosphatase: 78 IU/L (ref 44–121)
BUN/Creatinine Ratio: 14 (ref 12–28)
BUN: 12 mg/dL (ref 8–27)
Bilirubin Total: 0.6 mg/dL (ref 0.0–1.2)
CO2: 21 mmol/L (ref 20–29)
Calcium: 9.7 mg/dL (ref 8.7–10.3)
Chloride: 105 mmol/L (ref 96–106)
Creatinine, Ser: 0.88 mg/dL (ref 0.57–1.00)
Globulin, Total: 2.5 g/dL (ref 1.5–4.5)
Glucose: 139 mg/dL — ABNORMAL HIGH (ref 70–99)
Potassium: 3.5 mmol/L (ref 3.5–5.2)
Sodium: 141 mmol/L (ref 134–144)
Total Protein: 6.6 g/dL (ref 6.0–8.5)
eGFR: 67 mL/min/{1.73_m2} (ref >59)

## 2022-08-07 LAB — CBC
Hematocrit: 37.5 % (ref 34.0–46.6)
Hemoglobin: 12.2 g/dL (ref 11.1–15.9)
MCH: 29.3 pg (ref 26.6–33.0)
MCHC: 32.5 g/dL (ref 31.5–35.7)
MCV: 90 fL (ref 79–97)
Platelets: 359 10*3/uL (ref 150–450)
RBC: 4.16 x10E6/uL (ref 3.77–5.28)
RDW: 13.3 % (ref 11.7–15.4)
WBC: 7.5 10*3/uL (ref 3.4–10.8)

## 2022-08-07 LAB — LIPID PANEL
Chol/HDL Ratio: 3.4 ratio (ref 0.0–4.4)
Cholesterol, Total: 234 mg/dL — ABNORMAL HIGH (ref 100–199)
HDL: 69 mg/dL (ref >39)
LDL Chol Calc (NIH): 139 mg/dL — ABNORMAL HIGH (ref 0–99)
Triglycerides: 146 mg/dL (ref 0–149)
VLDL Cholesterol Cal: 26 mg/dL (ref 5–40)

## 2022-08-07 LAB — TSH: TSH: 3.31 u[IU]/mL (ref 0.450–4.500)

## 2022-08-07 LAB — HEMOGLOBIN A1C
Est. average glucose Bld gHb Est-mCnc: 137 mg/dL
Hgb A1c MFr Bld: 6.4 % — ABNORMAL HIGH (ref 4.8–5.6)

## 2022-08-07 MED ORDER — ROSUVASTATIN CALCIUM 5 MG PO TABS: 5.0000 mg | ORAL_TABLET | Freq: Every day | ORAL | 3 refills | Status: DC

## 2022-08-07 NOTE — Telephone Encounter (Signed)
Spoke with pt. Pt was notified of lab results and has agreed to see pharm-D clinic as recommended by Bernadene Person NP. Pt has been off of her Crestor 5 mg qd for 1-2 weeks. Refill sent in to pts pharmacy. Order placed for pharm-D.

## 2022-08-08 NOTE — Telephone Encounter (Signed)
  Patient is scheduled with pharmD

## 2022-09-02 ENCOUNTER — Encounter: Payer: Self-pay | Admitting: Gastroenterology

## 2022-09-12 ENCOUNTER — Other Ambulatory Visit (HOSPITAL_COMMUNITY): Payer: Self-pay

## 2022-09-12 ENCOUNTER — Encounter: Payer: Self-pay | Admitting: Pharmacist

## 2022-09-12 ENCOUNTER — Ambulatory Visit: Payer: Medicare Other | Attending: Cardiology | Admitting: Pharmacist

## 2022-09-12 ENCOUNTER — Other Ambulatory Visit: Payer: Self-pay

## 2022-09-12 DIAGNOSIS — E119 Type 2 diabetes mellitus without complications: Secondary | ICD-10-CM | POA: Diagnosis not present

## 2022-09-12 DIAGNOSIS — I2510 Atherosclerotic heart disease of native coronary artery without angina pectoris: Secondary | ICD-10-CM

## 2022-09-12 DIAGNOSIS — I10 Essential (primary) hypertension: Secondary | ICD-10-CM | POA: Diagnosis not present

## 2022-09-12 DIAGNOSIS — Z7984 Long term (current) use of oral hypoglycemic drugs: Secondary | ICD-10-CM

## 2022-09-12 DIAGNOSIS — Z9861 Coronary angioplasty status: Secondary | ICD-10-CM

## 2022-09-12 DIAGNOSIS — E7800 Pure hypercholesterolemia, unspecified: Secondary | ICD-10-CM | POA: Diagnosis not present

## 2022-09-12 MED ORDER — ROSUVASTATIN CALCIUM 10 MG PO TABS
10.0000 mg | ORAL_TABLET | Freq: Every day | ORAL | 1 refills | Status: DC
  Filled 2022-09-12 (×2): qty 90, 90d supply, fill #0

## 2022-09-12 MED ORDER — OLMESARTAN MEDOXOMIL 40 MG PO TABS
40.0000 mg | ORAL_TABLET | Freq: Every day | ORAL | 1 refills | Status: DC
  Filled 2022-09-12 (×2): qty 90, 90d supply, fill #0

## 2022-09-12 NOTE — Patient Instructions (Addendum)
It was nice meeting you today  We would like your LDL to be less than 55  We will increase your rosuvastatin to 10mg  once a day.  Please continue also taking your ezetimibe 10mg  once a day  I will place a lab order and you can update your cholesterol in about 2 months  If your LDL remains high, then we can discuss starting the Repatha  Please message or call with any questions  Laural Golden, PharmD, BCACP, CDCES, CPP [address] [place], [place], [address] Phone: [phone], Fax: [phone]

## 2022-09-12 NOTE — Progress Notes (Signed)
Patient ID: Debbie [name]                 DOB: [birth year]                    MRN: [id number]     HPI: Debbie [name] is a 77 y.o. female patient referred to lipid clinic by Bernadene Person. She is a patient of Dr Tresa Endo. PMH is significant for PAF, HTN, CAD, CVA, T2DM, and CKD.   Patient recent LDL elevated at 139 however she reveleaed she had not picked up her rosuvastatin in 1-2 weeks.  Patient presents today in good spirits. I saw her husband recently and started Repatha. Patient confused regarding her medication regimen and the indications for each.  Currently managed on rosuvastatin 5mg  daily with no adverse effects reported.   Current smoker, denies alcohol.  Current Medications:  Crestor 5mg   Ezetimibe 10mg    Risk Factors:  Smoking CAD CHF T2DM Hx of CVA  LDL goal: <55   Labs: TC 234, Trigs 146, HDL 69, LDL 139 (08/06/22)  Past Medical History:  Diagnosis Date   Arthritis    CAD (coronary artery disease) 11/1999   RCA stent X3, patent '06. Nuc low risk 9/13   Cataract    beginning stages   Diabetes mellitus without complication (HCC)    Dysrhythmia    History of stress test 01/08/2012   Showed minimal anterior thinning not felt to be significant.   Hx of echocardiogram 12/12/2011   EF>55%   Hypercholesteremia    Hypertension    Myocardial infarction [facility])    18 years ago    age 74   PAF (paroxysmal atrial fibrillation) (HCC) 11/2011   SSS component with some bradycardia   Stroke (HCC) 1998   Vitamin D deficiency     Current Outpatient Medications on File Prior to Visit  Medication Sig Dispense Refill   acetaminophen (TYLENOL) 500 MG tablet Take 2 tablets (1,000 mg total) by mouth every 8 (eight) hours as needed. 30 tablet 0   amLODipine (NORVASC) 10 MG tablet Take 1 tablet (10 mg total) by mouth daily. 30 tablet 0   apixaban (ELIQUIS) 5 MG TABS tablet Take 1 tablet (5 mg total) by mouth 2 (two) times daily. 60 tablet 5   clopidogrel (PLAVIX) 75 MG tablet  Take 1 tablet (75 mg total) by mouth daily with breakfast. 90 tablet 3   ezetimibe (ZETIA) 10 MG tablet Take 10 mg by mouth daily.     furosemide (LASIX) 20 MG tablet Take 1 tablet by mouth once daily 90 tablet 0   isosorbide mononitrate (IMDUR) 30 MG 24 hr tablet Take 0.5 tablets (15 mg total) by mouth daily. 45 tablet 3   metFORMIN (GLUCOPHAGE) 500 MG tablet Take 1 tablet (500 mg total) by mouth 2 (two) times daily.     methocarbamol (ROBAXIN) 500 MG tablet Take 500 mg by mouth 4 (four) times daily.     metoprolol succinate (TOPROL-XL) 100 MG 24 hr tablet Take 1 tablet (100 mg total) by mouth daily. Take with or immediately following a meal. 90 tablet 3   Multiple Vitamins-Minerals (CENTRUM SILVER ADULT 50+) TABS Take 1 tablet by mouth daily.     MYRBETRIQ 50 MG TB24 tablet Take 50 mg by mouth daily.     nitroGLYCERIN (NITROSTAT) 0.4 MG SL tablet Place 1 tablet (0.4 mg total) under the tongue every 5 (five) minutes as needed for chest pain. 25 tablet 3  olmesartan (BENICAR) 20 MG tablet Take 1 tablet (20 mg total) by mouth daily. (Patient taking differently: Take 40 mg by mouth daily.) 30 tablet 0   oxyCODONE (OXY IR/ROXICODONE) 5 MG immediate release tablet Take 1 tablet (5 mg total) by mouth every 6 (six) hours as needed for breakthrough pain. 15 tablet 0   polyethylene glycol (MIRALAX / GLYCOLAX) 17 g packet Take 17 g by mouth daily. 14 each 0   potassium chloride (KLOR-CON) 10 MEQ tablet Take 4 tablets (40 mEq total) by mouth daily. 30 tablet 0   potassium chloride SA (KLOR-CON M) 20 MEQ tablet One po bid x 2 days, then one po once a day 15 tablet 0   pregabalin (LYRICA) 25 MG capsule Take 1 capsule (25 mg total) by mouth 2 (two) times daily.     rosuvastatin (CRESTOR) 5 MG tablet Take 1 tablet (5 mg total) by mouth daily. 90 tablet 3   No current facility-administered medications on file prior to visit.    No Known Allergies  Assessment/Plan:  1. Hyperlipidemia - Patient last LDL 139  which is above goal of <55. However she had been off of her rosuvastatin. One year ago LDL was 63. Will increase rosuvastatin to 10mg  daily and recheck lipid panel in 2-3 months. If still above goal will consider increasing further or adding Repatha. Demonstrated how to use Repatha pen in room and counseled on side effects.  Patient voiced understanding.  Increase rosuvastatin to 10mg  daily Continue ezetimibe 10mg  daily Refill olmesartan 40mg  daily Recheck lipid panel in August 2024  Laural Golden, PharmD, BCACP, CDCES, CPP [address] [place], [place], [address] Phone: [phone], Fax: [phone]

## 2022-09-18 ENCOUNTER — Other Ambulatory Visit: Payer: Self-pay | Admitting: Cardiovascular Disease

## 2022-10-07 ENCOUNTER — Other Ambulatory Visit (HOSPITAL_BASED_OUTPATIENT_CLINIC_OR_DEPARTMENT_OTHER): Payer: Self-pay | Admitting: Family

## 2022-10-07 DIAGNOSIS — I480 Paroxysmal atrial fibrillation: Secondary | ICD-10-CM

## 2022-10-07 NOTE — Telephone Encounter (Signed)
Prescription refill request for Eliquis received. Indication: PAF Last office visit: 08/01/22  E Monge NP Scr: 0.88 on 08/06/22  Epic Age: 77 Weight: 77.1kg  Based on above findings Eliquis 5mg  twice daily is the appropriate dose.  Refill approved.

## 2022-11-14 ENCOUNTER — Other Ambulatory Visit (HOSPITAL_BASED_OUTPATIENT_CLINIC_OR_DEPARTMENT_OTHER): Payer: Self-pay | Admitting: Family

## 2022-11-14 DIAGNOSIS — I480 Paroxysmal atrial fibrillation: Secondary | ICD-10-CM

## 2022-11-14 DIAGNOSIS — I25118 Atherosclerotic heart disease of native coronary artery with other forms of angina pectoris: Secondary | ICD-10-CM

## 2022-11-20 ENCOUNTER — Other Ambulatory Visit: Payer: Self-pay | Admitting: Cardiovascular Disease

## 2022-12-29 ENCOUNTER — Other Ambulatory Visit: Payer: Self-pay | Admitting: Cardiovascular Disease

## 2023-01-02 ENCOUNTER — Other Ambulatory Visit: Payer: Self-pay | Admitting: Internal Medicine

## 2023-01-02 DIAGNOSIS — Z1231 Encounter for screening mammogram for malignant neoplasm of breast: Secondary | ICD-10-CM

## 2023-01-05 ENCOUNTER — Other Ambulatory Visit: Payer: Self-pay | Admitting: Cardiovascular Disease

## 2023-02-04 ENCOUNTER — Ambulatory Visit: Payer: Medicare PPO | Attending: Cardiovascular Disease | Admitting: Cardiovascular Disease

## 2023-02-04 ENCOUNTER — Encounter: Payer: Self-pay | Admitting: Cardiovascular Disease

## 2023-02-04 VITALS — BP 134/64 | HR 53 | Ht 65.5 in | Wt 176.0 lb

## 2023-02-04 DIAGNOSIS — E785 Hyperlipidemia, unspecified: Secondary | ICD-10-CM

## 2023-02-04 DIAGNOSIS — I480 Paroxysmal atrial fibrillation: Secondary | ICD-10-CM | POA: Diagnosis not present

## 2023-02-04 DIAGNOSIS — E7800 Pure hypercholesterolemia, unspecified: Secondary | ICD-10-CM

## 2023-02-04 DIAGNOSIS — D6859 Other primary thrombophilia: Secondary | ICD-10-CM

## 2023-02-04 DIAGNOSIS — Z8673 Personal history of transient ischemic attack (TIA), and cerebral infarction without residual deficits: Secondary | ICD-10-CM

## 2023-02-04 DIAGNOSIS — I2510 Atherosclerotic heart disease of native coronary artery without angina pectoris: Secondary | ICD-10-CM

## 2023-02-04 DIAGNOSIS — I10 Essential (primary) hypertension: Secondary | ICD-10-CM | POA: Diagnosis not present

## 2023-02-04 DIAGNOSIS — M545 Low back pain, unspecified: Secondary | ICD-10-CM

## 2023-02-04 DIAGNOSIS — G8929 Other chronic pain: Secondary | ICD-10-CM

## 2023-02-04 DIAGNOSIS — Z9861 Coronary angioplasty status: Secondary | ICD-10-CM

## 2023-02-04 MED ORDER — ASPIRIN 81 MG PO TBEC: 81.0000 mg | DELAYED_RELEASE_TABLET | Freq: Every day | ORAL | Status: DC

## 2023-02-04 NOTE — Progress Notes (Signed)
Patient ID: Debbie [name], female   DOB: 1946-03-18, 77 y.o.   MRN: [id number]     PCP: Dr. [facility]  HPI: Debbie [name], is a 77 y.o. female who presents to the office today for an 12 month followup evaluation.  Debbie [name] has known CAD and in 2001 suffered an acute coronary syndrome and was found to have total occlusion of her RCA. She had 3 stents placed to her RCA. Subsequent catheterization in 2006 showed all 3 stents widely patent. Additionally, she has a history of hypertension as well as hyperlipidemia. She also has a history of atrial tachycardia. A nuclear perfusion study in September 2013 showed minimal anterior thinning not felt to be significant.  Because of mild possible claudication symptoms her lower extremity, she underwent a lower extremity arterial Doppler study which suggested mild arterial insufficiency with ABIs of 0.83 bilaterally.  When I  saw her in September 2014 laboratory suggested that she had evolved into type 2 diabetes mellitus. At that time her hemoglobin A1c was 7.0 and her fasting blood sugar was 128. She had normal renal function. She subsequently saw Dr. Robert Bellow who did not initiate medical therapy but to pursue aggressive dietary adjustments initially.   She was evaluated in September 2017 by Debbie [name] after experiencing left-sided chest pressure which was not exertional and oftentimes occurring at the night.  It did not radiate to her arms, jaw, or back.  She denied associated nausea, vomiting or diaphoresis.  She was referred for nuclear perfusion study which was done on 01/19/2016.  This was a normal study.  Ejection fraction was 58%.  She underwent blood work time and on her dose of Lipitor 80 mg and Zetia 10 mg total cholesterol was 150, triglycerides 110, HDL 64, and LDL 64. There was very minimal renal insufficiency with a creatinine of 1.14.  Her fasting glucose was 150.    I saw her in November 2018 at which time she was remaining  stable and denied chest pain PND orthopnea presyncope or syncope.   I saw her on March 26, 2018 at which time she was continuing to do well and new to be on combination amlodipine/atorvastatin 10/80 in addition to Zetia 10 mg, HCTZ 25 mg, Toprol-XL 50 mg, and ramipril 10 mg daily for hypertension as well as hyperlipidemia.  At that time there was no chest pain or shortness of breath.   I evaluated her on April 08, 2019.  Over the prior year her blood pressure remained fairly stable and she continued to be without recurrent anginal symptomatology.  She started to notice swelling of her ankles bilaterally despite taking HCTZ 25 mg daily.  She continues to be on Caduet 10/80, HCTZ 25 mg daily, and ramipril 10 mg.  She is on Zetia 10 mg for hyperlipidemia.  There was no chest pain PND orthopnea, presyncope or syncope and was unaware of palpitations.  I discussed the importance of increased weight loss and exercise.  When I saw her on March 13, 2021 she denied any chestain.  She sees Dr. Valarie Merino at [facility].  She is scheduled to undergo MRI of her lumbar spine by Dr. Yetta Barre.  She has been evaluated by Dr. Ethelene Hal for leg discomfort.  She had undergone a robotic assisted laparoscopic hysterectomy with bilateral salpingo-oophorectomy in December 2021 for an ovarian mass.  Presently, she denies chest pain or shortness of breath.  She had some blood pressure lability.  During that evaluation, I suggested  changing her from ACE inhibition to ARB therapy and discontinued ramipril and started olmesartan 20 mg daily.  I recommended she monitor her blood pressure closely with potential further titration to 40 mg.  Since her prior visit she has undergone several additional evaluations and admissions.  On July 02, 2021 she underwent posterior lumbar interbody fusion at L3-4, L4-5.  On March 27 through July 10, 2021 she was admitted with HFp EF exacerbation felt secondary to fluid received during surgery.  An  echocardiogram revealed preserved LV function.  On July 25, 2021 she presented to the ER with atrial fibrillation and was started on Eliquis 5 mg twice a day and Toprol was increased to 100 mg.  Magnesium and potassium were repleted.  On April 16 through August 11, 2021 she presented with chest pain that resolved with nitroglycerin and underwent repeat cardiac catheterization on July 31, 2021 which showed 80% proximal RCA stenosis between the previously placed proximal and mid stent with in-stent restenosis in the mid stent and she had a patent distal third RCA stent.  She received a new 2.5 x 18 mm Onyx Frontier stent in the RCA.  It was recommended she continue triple therapy for 1 month with aspirin/Plavix/Eliquis and then discontinue aspirin.  On April 05 August 2021 she presented with leg pain and weakness and she was felt to have true radiculopathy and was discharged with a back brace and Lyrica with follow-up neurosurgery evaluation.  On September 14, 2021 she had a mechanical fall and hit her head and was evaluated in the ER.  Head CT was unremarkable.  She was evaluated by Gillian Shields, NP in the office on September 17, 2021 and felt improved.  She was using her walker at home.  She denied chest pain shortness of breath dizziness or palpitations.  Stamina was gradually improving.  I last saw her on January 31, 2022. She was experiencing low back discomfort.  She denies any chest pain.  She states her blood pressure at home has been in the 140 systolic.  She continues to be on amlodipine 10 mg, furosemide 20 mg, metoprolol succinate 100 mg daily and olmesartan 20 mg for hypertension.  She is on Eliquis and Plavix following her stent.  She continues to be on rosuvastatin at 5 mg for hyperlipidemia.  She is on Lyrica for her neuropathy and takes Myrbetriq for bladder issues.  During that evaluation, her blood pressure was elevated and ECG showed atrial bigeminy with ventricular rate in the 50s.  I recommended slight  titration of olmesartan from 20 mg up to 30 mg for 2 weeks and if her systolic blood pressure remain greater than 140 she was advised to increase to 40 mg.  Since I last saw her, she tells me 1 month ago she had COVID and pneumonia and required antibiotics.  She has not had recurrent anginal symptoms.  Her last coronary intervention was in April 2023 at which time she received a new Onyx frontier stent in the RCA between the previously placed proximal and mid stent with in-stent restenosis in the mid stent and had a patent distal third stent.  She was advised to take triple therapy for 1 month and since that time has continued to be on Plavix and Eliquis.  She presents for reevaluation.   Past Medical History:  Diagnosis Date   Arthritis    CAD (coronary artery disease) 11/1999   RCA stent X3, patent '06. Nuc low risk 9/13   Cataract  beginning stages   Diabetes mellitus without complication (HCC)    Dysrhythmia    History of stress test 01/08/2012   Showed minimal anterior thinning not felt to be significant.   Hx of echocardiogram 12/12/2011   EF>55%   Hypercholesteremia    Hypertension    Myocardial infarction [facility])    18 years ago    age 69   PAF (paroxysmal atrial fibrillation) (HCC) 11/2011   SSS component with some bradycardia   Stroke (HCC) 1998   Vitamin D deficiency     Past Surgical History:  Procedure Laterality Date   ABDOMINAL HYSTERECTOMY     CATARACT EXTRACTION, BILATERAL     CHOLECYSTECTOMY     COLONOSCOPY     CORONARY ANGIOGRAM  08/2004   patent stents   CORONARY ANGIOPLASTY WITH STENT PLACEMENT  11/1999   X 3   CORONARY STENT INTERVENTION N/A 07/31/2021   Procedure: CORONARY STENT INTERVENTION;  Surgeon: Lennette Bihari, MD;  Location: [facility] INVASIVE CV LAB;  Service: Cardiovascular;  Laterality: N/A;   EYE SURGERY     LEFT HEART CATH AND CORONARY ANGIOGRAPHY N/A 07/31/2021   Procedure: LEFT HEART CATH AND CORONARY ANGIOGRAPHY;  Surgeon: Lennette Bihari, MD;   Location: [facility] INVASIVE CV LAB;  Service: Cardiovascular;  Laterality: N/A;   MULTIPLE TOOTH EXTRACTIONS     ROBOTIC ASSISTED TOTAL HYSTERECTOMY WITH BILATERAL SALPINGO OOPHERECTOMY Bilateral 03/29/2020   Procedure: XI ROBOTIC ASSISTED TOTAL HYSTERECTOMY WITH BILATERAL SALPINGO OOPHORECTOMY;  Surgeon: Gerald Leitz, MD;  Location: [facility] OR;  Service: Gynecology;  Laterality: Bilateral;  Tracie to RNFA confirmed on 02/16/20 CS    No Known Allergies  Current Outpatient Medications  Medication Sig Dispense Refill   acetaminophen (TYLENOL) 500 MG tablet Take 2 tablets (1,000 mg total) by mouth every 8 (eight) hours as needed. 30 tablet 0   apixaban (ELIQUIS) 5 MG TABS tablet Take 1 tablet by mouth twice daily 60 tablet 5   aspirin EC 81 MG tablet Take 1 tablet (81 mg total) by mouth daily. Swallow whole.     ezetimibe (ZETIA) 10 MG tablet Take 1 tablet by mouth once daily 90 tablet 3   furosemide (LASIX) 20 MG tablet Take 1 tablet by mouth once daily 90 tablet 0   isosorbide mononitrate (IMDUR) 30 MG 24 hr tablet Take 0.5 tablets (15 mg total) by mouth daily. 45 tablet 3   metFORMIN (GLUCOPHAGE) 500 MG tablet Take 1 tablet (500 mg total) by mouth 2 (two) times daily.     metoprolol succinate (TOPROL-XL) 100 MG 24 hr tablet TAKE 1 TABLET BY MOUTH ONCE DAILY WITH FOOD OR IMMEDIATELY FOLLOWING MEAL 90 tablet 2   Multiple Vitamins-Minerals (CENTRUM SILVER ADULT 50+) TABS Take 1 tablet by mouth daily.     MYRBETRIQ 50 MG TB24 tablet Take 50 mg by mouth daily.     nitroGLYCERIN (NITROSTAT) 0.4 MG SL tablet Place 1 tablet (0.4 mg total) under the tongue every 5 (five) minutes as needed for chest pain. 25 tablet 3   olmesartan (BENICAR) 40 MG tablet Take 1 tablet (40 mg total) by mouth daily. 90 tablet 1   amLODipine (NORVASC) 10 MG tablet Take 1 tablet (10 mg total) by mouth daily. 30 tablet 0   rosuvastatin (CRESTOR) 10 MG tablet Take 1 tablet (10 mg total) by mouth daily. 90 tablet 1   No current  facility-administered medications for this visit.    Socially she denies tobacco or alcohol. She does not routinely exercise.  ROS General: Negative; No fevers, chills, or night sweats;  HEENT: Negative; No changes in vision or hearing, sinus congestion, difficulty swallowing Pulmonary: Negative; No cough, wheezing, shortness of breath, hemoptysis Cardiovascular: Negative; No chest pain, presyncope, syncope, palpitations GI: Negative; No nausea, vomiting, diarrhea, or abdominal pain GU: Negative; No dysuria, hematuria, or difficulty voiding Musculoskeletal: Negative; no myalgias, joint pain, or weakness Hematologic/Oncology: Negative; no easy bruising, bleeding Endocrine: Negative; no heat/cold intolerance; no diabetes Neuro: Negative; no changes in balance, headaches Skin: Negative; No rashes or skin lesions Psychiatric: Negative; No behavioral problems, depression Sleep: Negative; No snoring, daytime sleepiness, hypersomnolence, bruxism, restless legs, hypnogognic hallucinations, no cataplexy Other comprehensive 14 point system review is negative.   PE BP 134/64   Pulse (!) 53   Ht 5' 5.5" (1.664 m)   Wt 176 lb (79.8 kg)   SpO2 96%   BMI 28.84 kg/m    Repeat blood pressure by me was 140/68.  Wt Readings from Last 3 Encounters:  02/04/23 176 lb (79.8 kg)  08/01/22 170 lb (77.1 kg)  01/31/22 163 lb 6.4 oz (74.1 kg)   General: Alert, oriented, no distress.  Skin: normal turgor, no rashes, warm and dry HEENT: Normocephalic, atraumatic. Pupils equal round and reactive to light; sclera anicteric; extraocular muscles intact;  Nose without nasal septal hypertrophy Mouth/Parynx benign; Mallinpatti scale 3 Neck: No JVD, no carotid bruits; normal carotid upstroke Lungs: clear to ausculatation and percussion; no wheezing or rales Chest wall: without tenderness to palpitation Heart: PMI not displaced, RRR, s1 s2 normal, 1/6 systolic murmur, no diastolic murmur, no rubs, gallops,  thrills, or heaves Abdomen: soft, nontender; no hepatosplenomehaly, BS+; abdominal aorta nontender and not dilated by palpation. Back: no CVA tenderness Pulses 2+ Musculoskeletal: full range of motion, normal strength, no joint deformities Extremities: no clubbing cyanosis or edema, Homan's sign negative  Neurologic: grossly nonfocal; Cranial nerves grossly wnl Psychologic: Normal mood and affect   EKG Interpretation Date/Time:  Tuesday February 04 2023 16:17:12 EDT Ventricular Rate:  53 PR Interval:  144 QRS Duration:  88 QT Interval:  422 QTC Calculation: 395 R Axis:   52  Text Interpretation: Sinus bradycardia with sinus arrhythmia Left ventricular hypertrophy with repolarization abnormality ( Sokolow-Lyon ) Possible Inferior infarct , age undetermined When compared with ECG of 24-Dec-2021 10:39, Premature atrial complexes are no longer Present T wave inversion now evident in Lateral leads Confirmed by Nicki Guadalajara ([id number]) on 02/04/2023 4:55:44 PM     January 31, 2022 ECG (independently read by me): Sinus bradycardia at 55, PACs with atrial bigeminy  March 13, 2021 ECG (independently read by me):  Sinus bradycardia at 57; LVH, old IMI  December 2020 ECG (independently read by me): Sinus bradycardia 58 bpm with an isolated PVC.  Nonspecific T wave abnormality inferolaterally.  December 2019 ECG (independently read by me): Sinus bradycardia 59 bpm.  PAC.  November 2018 ECG (independently read by me): Sinus bradycardia 53 with sinus arrhythmia.  Small inferior Q waves.  November 2017 ECG (independently read by me): Sinus bradycardia at 53 with mild sinus arrhythmia.  Nonspecific T changes.  September 2016 ECG (independently read by me): Sinus bradycardia 55 bpm.  No significant ST-T changes.  March 2015 ECG (independentlyby me): Sinus bradycardia 55 beats per minute. No ectopy.  Prior September,16 2014 ECG: Sinus bradycardia 48 beats per minute. Intervals normal  LABS:     Latest Ref Rng & Units 02/05/2023    9:48 AM 08/06/2022    8:20 AM 12/24/2021  10:34 AM  BMP  Glucose 70 - 99 mg/dL 161  096  045   BUN 8 - 27 mg/dL 13  12  11    Creatinine 0.57 - 1.00 mg/dL 4.09  8.11  9.14   BUN/Creat Ratio 12 - 28 13  14     Sodium 134 - 144 mmol/L 143  141  141   Potassium 3.5 - 5.2 mmol/L 3.8  3.5  2.7   Chloride 96 - 106 mmol/L 105  105  109   CO2 20 - 29 mmol/L 24  21  22    Calcium 8.7 - 10.3 mg/dL 9.9  9.7  9.7       Latest Ref Rng & Units 02/05/2023    9:48 AM 08/06/2022    8:20 AM 12/24/2021   10:34 AM  Hepatic Function  Total Protein 6.0 - 8.5 g/dL 6.8  6.6  6.5   Albumin 3.8 - 4.8 g/dL 4.3  4.1  3.5   AST 0 - 40 IU/L 13  13  18    ALT 0 - 32 IU/L 11  12  18    Alk Phosphatase 44 - 121 IU/L 68  78  52   Total Bilirubin 0.0 - 1.2 mg/dL 0.7  0.6  1.1       Latest Ref Rng & Units 08/06/2022    8:20 AM 12/24/2021   10:34 AM 09/14/2021    3:14 PM  CBC  WBC 3.4 - 10.8 x10E3/uL 7.5  7.4  5.8   Hemoglobin 11.1 - 15.9 g/dL 78.2  95.6  21.3   Hematocrit 34.0 - 46.6 % 37.5  32.5  32.7   Platelets 150 - 450 x10E3/uL 359  314  352    Lab Results  Component Value Date   MCV 90 08/06/2022   MCV 92.1 12/24/2021   MCV 92.9 09/14/2021   Lab Results  Component Value Date   TSH 3.310 08/06/2022   Lab Results  Component Value Date   HGBA1C 6.4 (H) 08/06/2022   Lipid Panel     Component Value Date/Time   CHOL 182 02/05/2023 0948   TRIG 91 02/05/2023 0948   HDL 79 02/05/2023 0948   CHOLHDL 2.3 02/05/2023 0948   CHOLHDL 2.5 07/30/2021 0508   VLDL 19 07/30/2021 0508   LDLCALC 87 02/05/2023 0948   RADIOLOGY: No results found.  IMPRESSION:  1. Essential hypertension   2. CAD S/P percutaneous coronary angioplasty   3. Hyperlipidemia LDL goal <70   4. Paroxysmal atrial fibrillation (HCC)   5. Hypercoagulable state (HCC)   6. Chronic low back pain, unspecified back pain laterality, unspecified whether sciatica present   7. History of CVA, 1998       ASSESSMENT AND PLAN: Ms. Batdorf is a 77 year old female who is 22 years (2001) following her initial multi lesion intervention to her RCA in the setting of an ACS at which time she was found to have total RCA occlusion and had successful insertion of 3 stents.  On follow-up catheterization in 2006 all stents were widely patent.  Over the past 2 decades she has been aggressively treated with lipid management and has been on atorvastatin 80 mg in addition to Zetia 10 mg.  Lipid studies in December 2019 showed a total cholesterol 154, HDL 62 LDL 71 triglycerides 103.  When I last saw her in 2020, she was having some mild edema despite taking HCTZ and at that time was given a prescription for furosemide.  At her office visit with me  in November 2022 due to blood pressure elevation she was changed to ARB therapy from ACE inhibition.  Since her evaluation, she has had multiple physician evaluations and hospital evaluations which are outlined in my HPI.  She had developed atrial fibrillation in April 2023 and also developed recurrent anginal symptomatology and underwent repeat catheterization by me on July 31, 2021.  She had normal LAD and left circumflex coronary arteries.  The RCA had a previously placed proximal stent but now had 80% stenosis between the proximal and mid stent with 50% narrowing within the mid stented segment and she had a patent distal third stent.  She underwent successful PCI of the 80% stenosis and PTCA of the mid in-stent renarrowing.  She was treated with triple drug therapy with aspirin/clopidogrel and Eliquis for 1 month and then was advised to discontinue aspirin.  On her more recent evaluation, her blood pressure remained elevated and olmesartan was further titrated.  Her blood pressure today on presentation was stable at 134/64 on amlodipine 10 mg, furosemide 20 mg, metoprolol succinate 100 mg and olmesartan 40 mg.  She has continued to be on Zetia and rosuvastatin 10 mg for  hyperlipidemia.  LDL in April 2023 was excellent at 81, but more recent laboratory April 2024 was elevated at 139.  At that time, she had seen Ginger Organ and had been off rosuvastatin for several months.  Rosuvastatin was resumed at 10 mg.  I am recommending follow-up fasting laboratory with comprehensive metabolic panel, fasting lipid panel, and LP(a).  Target LDL is less than 70 and if LP(a) is elevated I would push for LDL less than 50.  She is now 18 months since her last intervention and I have suggested she discontinue clopidogrel and resume aspirin 81 mg to take with Eliquis 5 mg twice a day.  I will see her in 4 months for follow-up evaluation.  Lennette Bihari, MD, [facility]  02/09/2023 1:53 PM

## 2023-02-04 NOTE — Patient Instructions (Signed)
Medication Instructions:  Your physician has recommended you make the following change in your medication:   -Stop clopidogrel (plavix).  -Start aspirin EC 81mg  once daily.   *If you need a refill on your cardiac medications before your next appointment, please call your pharmacy*   Lab Work: Your physician recommends that you return for lab work in: the next week or 2 for FASTING CMET, Lipids & LP(a)  If you have labs (blood work) drawn today and your tests are completely normal, you will receive your results only by: MyChart Message (if you have MyChart) OR A paper copy in the mail If you have any lab test that is abnormal or we need to change your treatment, we will call you to review the results.   Follow-Up: At [facility], you and your health needs are our priority.  As part of our continuing mission to provide you with exceptional heart care, we have created designated Provider Care Teams.  These Care Teams include your primary Cardiologist (physician) and Advanced Practice Providers (APPs -  Physician Assistants and Nurse Practitioners) who all work together to provide you with the care you need, when you need it.  We recommend signing up for the patient portal called "MyChart".  Sign up information is provided on this After Visit Summary.  MyChart is used to connect with patients for Virtual Visits (Telemedicine).  Patients are able to view lab/test results, encounter notes, upcoming appointments, etc.  Non-urgent messages can be sent to your provider as well.   To learn more about what you can do with MyChart, go to [email or web address].    Your next appointment:   4 month(s)  Provider:   Nicki Guadalajara, MD

## 2023-02-06 LAB — COMPREHENSIVE METABOLIC PANEL
ALT: 11 [IU]/L (ref 0–32)
AST: 13 [IU]/L (ref 0–40)
Albumin: 4.3 g/dL (ref 3.8–4.8)
Alkaline Phosphatase: 68 [IU]/L (ref 44–121)
BUN/Creatinine Ratio: 13 (ref 12–28)
BUN: 13 mg/dL (ref 8–27)
Bilirubin Total: 0.7 mg/dL (ref 0.0–1.2)
CO2: 24 mmol/L (ref 20–29)
Calcium: 9.9 mg/dL (ref 8.7–10.3)
Chloride: 105 mmol/L (ref 96–106)
Creatinine, Ser: 1.03 mg/dL — ABNORMAL HIGH (ref 0.57–1.00)
Globulin, Total: 2.5 g/dL (ref 1.5–4.5)
Glucose: 123 mg/dL — ABNORMAL HIGH (ref 70–99)
Potassium: 3.8 mmol/L (ref 3.5–5.2)
Sodium: 143 mmol/L (ref 134–144)
Total Protein: 6.8 g/dL (ref 6.0–8.5)
eGFR: 56 mL/min/{1.73_m2} — ABNORMAL LOW (ref >59)

## 2023-02-06 LAB — LIPID PANEL
Chol/HDL Ratio: 2.3 ratio (ref 0.0–4.4)
Cholesterol, Total: 182 mg/dL (ref 100–199)
HDL: 79 mg/dL (ref >39)
LDL Chol Calc (NIH): 87 mg/dL (ref 0–99)
Triglycerides: 91 mg/dL (ref 0–149)
VLDL Cholesterol Cal: 16 mg/dL (ref 5–40)

## 2023-02-07 ENCOUNTER — Ambulatory Visit
Admission: RE | Admit: 2023-02-07 | Discharge: 2023-02-07 | Disposition: A | Discharge status: Home / Self Care | Payer: Medicare PPO | Source: Ambulatory Visit | Attending: Internal Medicine | Admitting: Internal Medicine

## 2023-02-07 DIAGNOSIS — Z1231 Encounter for screening mammogram for malignant neoplasm of breast: Secondary | ICD-10-CM

## 2023-02-09 ENCOUNTER — Encounter: Payer: Self-pay | Admitting: Cardiovascular Disease

## 2023-02-14 ENCOUNTER — Other Ambulatory Visit: Payer: Self-pay | Admitting: *Deleted

## 2023-02-14 DIAGNOSIS — E119 Type 2 diabetes mellitus without complications: Secondary | ICD-10-CM

## 2023-02-14 DIAGNOSIS — I2510 Atherosclerotic heart disease of native coronary artery without angina pectoris: Secondary | ICD-10-CM

## 2023-02-14 DIAGNOSIS — E7800 Pure hypercholesterolemia, unspecified: Secondary | ICD-10-CM

## 2023-02-14 MED ORDER — ROSUVASTATIN CALCIUM 20 MG PO TABS: 20.0000 mg | ORAL_TABLET | Freq: Every day | ORAL | 3 refills | Status: DC

## 2023-03-18 ENCOUNTER — Emergency Department (HOSPITAL_COMMUNITY): Payer: Medicare PPO

## 2023-03-18 ENCOUNTER — Encounter (HOSPITAL_COMMUNITY): Payer: Self-pay

## 2023-03-18 ENCOUNTER — Other Ambulatory Visit: Payer: Self-pay

## 2023-03-18 ENCOUNTER — Emergency Department (HOSPITAL_COMMUNITY)
Admission: EM | Admit: 2023-03-18 | Discharge: 2023-03-18 | Disposition: A | Discharge status: Home / Self Care | Payer: Medicare PPO | Attending: Emergency Medicine | Admitting: Emergency Medicine

## 2023-03-18 DIAGNOSIS — Z7982 Long term (current) use of aspirin: Secondary | ICD-10-CM | POA: Diagnosis not present

## 2023-03-18 DIAGNOSIS — Z7902 Long term (current) use of antithrombotics/antiplatelets: Secondary | ICD-10-CM | POA: Diagnosis not present

## 2023-03-18 DIAGNOSIS — Z7901 Long term (current) use of anticoagulants: Secondary | ICD-10-CM | POA: Insufficient documentation

## 2023-03-18 DIAGNOSIS — R079 Chest pain, unspecified: Secondary | ICD-10-CM | POA: Insufficient documentation

## 2023-03-18 DIAGNOSIS — R001 Bradycardia, unspecified: Secondary | ICD-10-CM | POA: Diagnosis not present

## 2023-03-18 LAB — CBC WITH DIFFERENTIAL/PLATELET
Abs Immature Granulocytes: 0.03 10*3/uL (ref 0.00–0.07)
Basophils Absolute: 0.1 10*3/uL (ref 0.0–0.1)
Basophils Relative: 1 %
Eosinophils Absolute: 0.1 10*3/uL (ref 0.0–0.5)
Eosinophils Relative: 1 %
HCT: 34.7 % — ABNORMAL LOW (ref 36.0–46.0)
Hemoglobin: 11.6 g/dL — ABNORMAL LOW (ref 12.0–15.0)
Immature Granulocytes: 0 %
Lymphocytes Relative: 26 %
Lymphs Abs: 2.2 10*3/uL (ref 0.7–4.0)
MCH: 29.4 pg (ref 26.0–34.0)
MCHC: 33.4 g/dL (ref 30.0–36.0)
MCV: 88.1 fL (ref 80.0–100.0)
Monocytes Absolute: 0.6 10*3/uL (ref 0.1–1.0)
Monocytes Relative: 7 %
Neutro Abs: 5.5 10*3/uL (ref 1.7–7.7)
Neutrophils Relative %: 65 %
Platelets: 275 10*3/uL (ref 150–400)
RBC: 3.94 MIL/uL (ref 3.87–5.11)
RDW: 12.5 % (ref 11.5–15.5)
WBC: 8.4 10*3/uL (ref 4.0–10.5)
nRBC: 0 % (ref 0.0–0.2)

## 2023-03-18 LAB — COMPREHENSIVE METABOLIC PANEL
ALT: 25 U/L (ref 0–44)
AST: 24 U/L (ref 15–41)
Albumin: 3.3 g/dL — ABNORMAL LOW (ref 3.5–5.0)
Alkaline Phosphatase: 49 U/L (ref 38–126)
Anion gap: 9 (ref 5–15)
BUN: 13 mg/dL (ref 8–23)
CO2: 23 mmol/L (ref 22–32)
Calcium: 9.5 mg/dL (ref 8.9–10.3)
Chloride: 107 mmol/L (ref 98–111)
Creatinine, Ser: 1.19 mg/dL — ABNORMAL HIGH (ref 0.44–1.00)
GFR, Estimated: 47 mL/min — ABNORMAL LOW (ref >60)
Glucose, Bld: 128 mg/dL — ABNORMAL HIGH (ref 70–99)
Potassium: 3 mmol/L — ABNORMAL LOW (ref 3.5–5.1)
Sodium: 139 mmol/L (ref 135–145)
Total Bilirubin: 0.7 mg/dL (ref <1.2)
Total Protein: 6.3 g/dL — ABNORMAL LOW (ref 6.5–8.1)

## 2023-03-18 LAB — TROPONIN I (HIGH SENSITIVITY)
Troponin I (High Sensitivity): 30 ng/L — ABNORMAL HIGH (ref <18)
Troponin I (High Sensitivity): 32 ng/L — ABNORMAL HIGH (ref <18)

## 2023-03-18 NOTE — ED Provider Notes (Signed)
[facility] EMERGENCY DEPARTMENT AT [facility] Provider Note   CSN: [id number] Arrival date & time: 03/18/23  6295     History  Chief Complaint  Patient presents with   Chest Pain    Debbie [name] is a 77 y.o. female, history of A-fib on Eliquis, 4 stents, who presents to the ED secondary to episode of chest pain, that started around 8:30, and lasted until 9:30 AM.  She states that it was achy, associated with the left breast, and had no radiation.  Denies any nausea, vomiting, dizziness with this or shortness of breath.  States that she did become a little bit sweaty, when this occurred, overall has been doing well, has not had any dyspnea on exertion recently.  Has been compliant with her Eliquis and not missed any doses, as well as been compliant with her blood pressure medicines, is on a beta-blocker.  Is currently pain-free, received 324 of aspirin by EMS.  Home Medications Prior to Admission medications   Medication Sig Start Date End Date Taking? Authorizing Provider  acetaminophen (TYLENOL) 500 MG tablet Take 2 tablets (1,000 mg total) by mouth every 8 (eight) hours as needed. Patient taking differently: Take 1,000 mg by mouth every 8 (eight) hours as needed for moderate pain (pain score 4-6). 03/30/20  Yes Gerald Leitz, MD  albuterol (VENTOLIN HFA) 108 (90 Base) MCG/ACT inhaler Inhale 2 puffs into the lungs every 6 (six) hours as needed for shortness of breath or wheezing. 01/06/23  Yes [provider]  amLODipine (NORVASC) 10 MG tablet Take 1 tablet (10 mg total) by mouth daily. 12/24/21 03/18/23 Yes Loeffler, Finis Bud, PA-C  apixaban (ELIQUIS) 5 MG TABS tablet Take 1 tablet by mouth twice daily 10/07/22  Yes Lennette Bihari, MD  clopidogrel (PLAVIX) 75 MG tablet Take 75 mg by mouth daily.   Yes [provider]  ezetimibe (ZETIA) 10 MG tablet Take 1 tablet by mouth once daily 12/30/22  Yes Lennette Bihari, MD  furosemide (LASIX) 20 MG tablet Take 1 tablet by  mouth once daily 01/06/23  Yes Lennette Bihari, MD  isosorbide mononitrate (IMDUR) 30 MG 24 hr tablet Take 0.5 tablets (15 mg total) by mouth daily. 08/01/22  Yes Monge, Petra Kuba, NP  meloxicam (MOBIC) 7.5 MG tablet Take 7.5 mg by mouth 2 (two) times daily as needed for pain. 11/26/22  Yes [provider]  metFORMIN (GLUCOPHAGE) 500 MG tablet Take 1 tablet (500 mg total) by mouth 2 (two) times daily. Patient taking differently: Take 1,000 mg by mouth 2 (two) times daily with a meal. 08/02/21  Yes Pokhrel, Laxman, MD  metoprolol succinate (TOPROL-XL) 100 MG 24 hr tablet TAKE 1 TABLET BY MOUTH ONCE DAILY WITH FOOD OR IMMEDIATELY FOLLOWING MEAL 11/14/22  Yes Alver Sorrow, NP  nitroGLYCERIN (NITROSTAT) 0.4 MG SL tablet Place 1 tablet (0.4 mg total) under the tongue every 5 (five) minutes as needed for chest pain. 04/05/21  Yes Lennette Bihari, MD  olmesartan (BENICAR) 40 MG tablet Take 1 tablet (40 mg total) by mouth daily. Patient taking differently: Take 20 mg by mouth daily. 09/12/22  Yes Lennette Bihari, MD  rosuvastatin (CRESTOR) 20 MG tablet Take 1 tablet (20 mg total) by mouth daily. 02/14/23 05/15/23 Yes Lennette Bihari, MD  aspirin EC 81 MG tablet Take 1 tablet (81 mg total) by mouth daily. Swallow whole. Patient not taking: Reported on 03/18/2023 02/04/23   Lennette Bihari, MD  Allergies    Patient has no known allergies.    Review of Systems   Review of Systems  Respiratory:  Negative for shortness of breath.   Cardiovascular:  Positive for chest pain.    Physical Exam Updated Vital Signs BP (!) 161/61 (BP Location: Right Arm)   Pulse (!) 51   Temp 98.1 F (36.7 C) (Oral)   Resp 18   Ht 5' 5.5" (1.664 m)   Wt 79.8 kg   SpO2 97%   BMI 28.84 kg/m  Physical Exam Vitals and nursing note reviewed.  Constitutional:      General: She is not in acute distress.    Appearance: She is well-developed.  HENT:     Head: Normocephalic and atraumatic.  Eyes:      Conjunctiva/sclera: Conjunctivae normal.  Cardiovascular:     Rate and Rhythm: Bradycardia present. Rhythm irregular.     Heart sounds: No murmur heard. Pulmonary:     Effort: Pulmonary effort is normal. No respiratory distress.     Breath sounds: Normal breath sounds.  Chest:     Comments: No chest wall tenderness to palpation Abdominal:     Palpations: Abdomen is soft.     Tenderness: There is no abdominal tenderness.  Musculoskeletal:        General: No swelling.     Cervical back: Neck supple.  Skin:    General: Skin is warm and dry.     Capillary Refill: Capillary refill takes less than 2 seconds.  Neurological:     Mental Status: She is alert.  Psychiatric:        Mood and Affect: Mood normal.     ED Results / Procedures / Treatments   Labs (all labs ordered are listed, but only abnormal results are displayed) Labs Reviewed  COMPREHENSIVE METABOLIC PANEL - Abnormal; Notable for the following components:      Result Value   Potassium 3.0 (*)    Glucose, Bld 128 (*)    Creatinine, Ser 1.19 (*)    Total Protein 6.3 (*)    Albumin 3.3 (*)    GFR, Estimated 47 (*)    All other components within normal limits  CBC WITH DIFFERENTIAL/PLATELET - Abnormal; Notable for the following components:   Hemoglobin 11.6 (*)    HCT 34.7 (*)    All other components within normal limits  TROPONIN I (HIGH SENSITIVITY) - Abnormal; Notable for the following components:   Troponin I (High Sensitivity) 30 (*)    All other components within normal limits  TROPONIN I (HIGH SENSITIVITY) - Abnormal; Notable for the following components:   Troponin I (High Sensitivity) 32 (*)    All other components within normal limits    EKG None  Radiology DG Chest 2 View  Result Date: 03/18/2023 CLINICAL DATA:  Chest pain EXAM: CHEST - 2 VIEW COMPARISON:  08/02/2021 FINDINGS: Cardiomegaly. Mild diffuse interstitial opacity. No acute osseous findings. IMPRESSION: Cardiomegaly with mild diffuse  interstitial opacity, likely mild edema. No focal airspace opacity. Electronically Signed   By: Jearld Lesch M.D.   On: 03/18/2023 10:41    Procedures Procedures    Medications Ordered in ED Medications - No data to display  ED Course/ Medical Decision Making/ A&P  Click here for ABCD2, HEART and other calculatorsREFRESH Note before signing :1}          HEART Score: 6  Medical Decision Making Patient is a 77 year old female, here for chest pain episode that lasted for about an hour, which was associated with left breast pain, with no radiation, did get a little bit diaphoretic back, but has not had any nausea, vomiting, or dizziness or shortness of breath.  She is overall very well-appearing, has been compliant with her Eliquis.  She is not tachycardic, and pain-free at my current evaluation.  Reviewed EKG, appears similar to past.  We will obtain troponins, chest x-ray for further evaluation.  She has no abdominal pain on my exam overall very reassuring, aspirin given  Amount and/or Complexity of Data Reviewed Labs: ordered.    Details: Troponins 30-32, fairly flat Radiology: ordered.    Details: Chest x-ray shows some cardiomegaly with mild diffuse interstitial opacity Discussion of management or test interpretation with external provider(s): Discussed with patient, blood work is reassuring, troponin is flat, it has been 3+ hours, since her symptoms, started, and that they are resolved at this time.  She has a heart score of 6, with mild elevation of her troponin likely thought secondary to cardiomegaly.  We will have her follow-up with her PCP, and cardiologist, return if symptoms worsen.  She voiced understanding was agreement with plan.  She is not dyspneic, and asymptomatic, in regards to the cardiomegaly, likely her right baseline. Discussed return precautions she voiced understanding    Final Clinical Impression(s) / ED Diagnoses Final diagnoses:  Chest pain,  unspecified type    Rx / DC Orders ED Discharge Orders          Ordered    Ambulatory referral to Cardiology       Comments: If you have not heard from the Cardiology office within the next 72 hours please call [phone].   03/18/23 1442              Media Pizzini, Harley Alto, PA 03/18/23 1443    Anders Simmonds T, DO 03/19/23 [phone]

## 2023-03-18 NOTE — Discharge Instructions (Addendum)
Your heart enzymes today are reassuring, there is no evidence of a heart attack.  Is unclear what occurred, when you had your chest pain, however you will need to follow-up with a cardiologist for further evaluation.  You have worsening chest pain, shortness of breath please return to the ER.

## 2023-03-18 NOTE — ED Triage Notes (Signed)
Reports was woke out of her sleep with left sided chest pain that felt like a rubber band.  Patient denies pain at this time.  Did take 324mg  ASA prior to EMS arrival.  Denies sob dizziness sweating or pain radiating.

## 2023-03-26 NOTE — Progress Notes (Signed)
Cardiology Office Note    Date:  03/28/2023  ID:  Kerith, Venard [birth year], MRN [id number] PCP:  Salli Real, MD  Cardiologist:  Nicki Guadalajara, MD  Electrophysiologist:  None   Chief Complaint: Hospital follow up for chest pain   History of Present Illness: Marland Kitchen    Debbie [name] is a 77 y.o. female with visit-pertinent history of CAD s/p DES x 3 to the RCA in 2003, s/p DES to o-pRCA and PTCA-mRCA (ISR) in 07/2021, paroxysmal atrial fibrillation on Eliquis, hypertension, hyperlipidemia, stroke, type 2 diabetes and chronic lower back pain.  Echocardiogram in 06/2021 showed EF 55 to 60%, normal LV function, no RWMA, no significant valvular abnormalities.  She was hospitalized in April 2023 in setting of chest pain.  Repeat cardiac catheterization revealed 80% proximal RCA stenosis between the previously placed proximal, mid RCA stent as well as ISR-mRCA stents s/p PTCA, patent dRCA stent.  She had successful PCI to the RCA with insertion of a 2.5 x 18 mm DES and PTCA of the mid in-stent 50 to 60% stenosis.  She was last seen in clinic by Dr. Tresa Endo on 02/04/2023, she remained stable from a cardiac perspective.  Her Plavix was discontinued and she was instructed to resume aspirin 81 mg to take with Eliquis 5 mg twice a day.  On 03/18/2023 she presented to the emergency room for left-sided chest pain that had started around 830 that morning and lasted until 930.  She stated that was achy associate with the left breast and had no radiation.  She denied any nausea, vomiting, dizziness with this or shortness of breath.  She noted that she had become a little bit sweaty but overall had been doing well.  When evaluated in the ED she was chest pain-free. Troponin 30>>32, EKG without acute changes.  She was discharged in stable condition with recommendation for follow-up with cardiology.  Today she presents for follow up. She reports that she is doing well. She denies any further chest pain, she denies  shortness of breath, palpitations, lower extremity edema, orthopnea or PND.  She denies any feeling of atrial fibrillation.  She reports that she has not been regularly checking her blood pressure at home, however when she does checks it is overall "fine".   ROS: .   Today she denies shortness of breath, lower extremity edema, fatigue, palpitations, melena, hematuria, hemoptysis, diaphoresis, weakness, presyncope, syncope, orthopnea, and PND.  All other systems are reviewed and otherwise negative. Studies Reviewed: Marland Kitchen    EKG:  EKG is not ordered today.   CV Studies:  Cardiac Studies & Procedures   CARDIAC CATHETERIZATION  CARDIAC CATHETERIZATION 07/31/2021  Narrative   Mid RCA lesion is 50% stenosed.   Prox RCA lesion is 80% stenosed.   Previously placed Ost RCA to Prox RCA stent (unknown type) is  widely patent.   Previously placed Dist RCA stent (unknown type) is  widely patent.   A drug-eluting stent was successfully placed.   Post intervention, there is a 0% residual stenosis.   Post intervention, there is a 0% residual stenosis.   Post intervention, there is a 0% residual stenosis.  Normal normal LAD and left circumflex coronary arteries.  The right coronary artery had a previously placed patent proximal stent, 80% stenosis between the proximal and mid stent with 50% in-stent narrowing in the mid stent and a patent distal third stent.  LVEDP 17 mmHg.  Successful percutaneous coronary intervention to the RCA with insertion of  a 2.5 x 18 mm Medtronic Onyx Frontier stent was dilated to 2.76 mm and PTCA of the mid in-stent 50 to 60% stenosis.  RECOMMENDATION: Recommend resumption of Eliquis commencing tomorrow with initial triple drug therapy for 1 month followed by Plavix/Eliquis.  The patient presented with significant hypertension and will need optimal blood pressure control.  Aggressive lipid-lowering therapy with target LDL less than 70.  Findings Coronary  Findings Diagnostic  Dominance: Right  Right Coronary Artery Previously placed Ost RCA to Prox RCA stent (unknown type) is  widely patent. Prox RCA lesion is 80% stenosed. Mid RCA lesion is 50% stenosed. The lesion was previously treated . Previously placed Dist RCA stent (unknown type) is  widely patent.  Intervention  Ost RCA to Prox RCA lesion Stent (Also treats lesions: Prox RCA, and Mid RCA) Lesion crossed with guidewire. Pre-stent angioplasty was performed. A drug-eluting stent was successfully placed. Post-Intervention Lesion Assessment The intervention was successful. Pre-interventional TIMI flow is 3. Post-intervention TIMI flow is 3. There is a 0% residual stenosis post intervention.  Prox RCA lesion Stent (Also treats lesions: Ost RCA to Prox RCA, and Mid RCA) See details in Ost RCA to Prox RCA lesion. Post-Intervention Lesion Assessment The intervention was successful. Pre-interventional TIMI flow is 3. Post-intervention TIMI flow is 3. No complications occurred at this lesion. There is a 0% residual stenosis post intervention.  Mid RCA lesion Stent (Also treats lesions: Ost RCA to Prox RCA, and Prox RCA) See details in Ost RCA to Prox RCA lesion. Post-Intervention Lesion Assessment The intervention was successful. Pre-interventional TIMI flow is 3. Post-intervention TIMI flow is 3. No complications occurred at this lesion. There is a 0% residual stenosis post intervention.   STRESS TESTS  MYOCARDIAL PERFUSION IMAGING 01/19/2016  Narrative  The left ventricular ejection fraction is normal (55-65%).  There was no ST segment deviation noted during stress.  The study is normal.  This is a low risk study.  Normal resting and stress perfusion. No ischemia or infarction EF 58%  ECHOCARDIOGRAM  ECHOCARDIOGRAM COMPLETE 07/09/2021  Narrative ECHOCARDIOGRAM REPORT    Patient Name:   Debbie [name] [facility] Date of Exam: 07/09/2021 Medical Rec #:  [id number]        Height:       60.5 in Accession #:    [id number]      Weight:       197.5 lb Date of Birth:  [birth year]        BSA:          1.868 m Patient Age:    76 years        BP:           100/62 mmHg Patient Gender: F               HR:           75 bpm. Exam Location:  Inpatient  Procedure: 2D Echo, Cardiac Doppler and Color Doppler  Indications:    I 38 ENDOCARDITIS  History:        Patient has prior history of Echocardiogram examinations, most recent 02/06/2021. CHF, CAD; Risk Factors:Hypertension, Dyslipidemia and Diabetes. 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  Sonographer:    Festus Barren Referring Phys: [id number] Deno Lunger SHALHOUB  IMPRESSIONS   1. Left ventricular ejection fraction, by estimation, is 55 to 60%. The left ventricle has normal function. The left ventricle has no regional wall motion abnormalities. Left ventricular diastolic function could not be evaluated.  2. Right ventricular systolic function is normal. The right ventricular size is normal. 3. The mitral valve is normal in structure. Trivial mitral valve regurgitation. No evidence of mitral stenosis. 4. The aortic valve is tricuspid. Aortic valve regurgitation is not visualized. No aortic stenosis is present. 5. The inferior vena cava is normal in size with greater than 50% respiratory variability, suggesting right atrial pressure of 3 mmHg.  Comparison(s): No prior Echocardiogram.  FINDINGS Left Ventricle: Left ventricular ejection fraction, by estimation, is 55 to 60%. The left ventricle has normal function. The left ventricle has no regional wall motion abnormalities. The left ventricular internal cavity size was normal in size. There is no left ventricular hypertrophy. Left ventricular diastolic function could not be evaluated.  Right Ventricle: The right ventricular size is normal. Right ventricular systolic function is normal.  Left Atrium: Left atrial size was normal in  size.  Right Atrium: Right atrial size was normal in size.  Pericardium: Trivial pericardial effusion is present.  Mitral Valve: The mitral valve is normal in structure. Trivial mitral valve regurgitation. No evidence of mitral valve stenosis.  Tricuspid Valve: The tricuspid valve is normal in structure. Tricuspid valve regurgitation is trivial. No evidence of tricuspid stenosis.  Aortic Valve: The aortic valve is tricuspid. Aortic valve regurgitation is not visualized. No aortic stenosis is present. Aortic valve mean gradient measures 7.0 mmHg. Aortic valve peak gradient measures 11.8 mmHg.  Pulmonic Valve: The pulmonic valve was normal in structure. Pulmonic valve regurgitation is trivial. No evidence of pulmonic stenosis.  Aorta: The aortic root is normal in size and structure.  Venous: The inferior vena cava is normal in size with greater than 50% respiratory variability, suggesting right atrial pressure of 3 mmHg.  IAS/Shunts: No atrial level shunt detected by color flow Doppler.   LEFT VENTRICLE PLAX 2D LVIDd:         5.00 cm LVIDs:         2.70 cm LV PW:         1.00 cm LV IVS:        0.90 cm LVOT diam:     1.70 cm LV SV:         35 LV SV Index:   19 LVOT Area:     2.27 cm  LV Volumes (MOD) LV vol d, MOD A2C: 60.1 ml LV vol d, MOD A4C: 85.4 ml LV vol s, MOD A2C: 31.6 ml LV vol s, MOD A4C: 49.2 ml LV SV MOD A2C:     28.5 ml LV SV MOD A4C:     85.4 ml LV SV MOD BP:      31.4 ml  RIGHT VENTRICLE             IVC RV S prime:     16.60 cm/s  IVC diam: 2.20 cm RVOT diam:      2.20 cm TAPSE (M-mode): 3.0 cm  LEFT ATRIUM             Index        RIGHT ATRIUM           Index LA diam:        4.10 cm 2.19 cm/m   RA Area:     11.00 cm LA Vol (A2C):   53.9 ml 28.85 ml/m  RA Volume:   21.80 ml  11.67 ml/m LA Vol (A4C):   52.4 ml 28.05 ml/m LA Biplane Vol: 53.5 ml 28.64 ml/m AORTIC VALVE  PULMONIC VALVE AV Area (Vmax):    1.06 cm      PV Area  (Vmax):  5.31 cm AV Area (Vmean):   1.01 cm      PV Area (Vmean): 5.22 cm AV Area (VTI):     1.09 cm      PV Area (VTI):   4.60 cm AV Vmax:           172.00 cm/s   PV Vmax:         0.77 m/s AV Vmean:          118.000 cm/s  PV Vmean:        57.700 cm/s AV VTI:            0.322 m       PV VTI:          0.218 m AV Peak Grad:      11.8 mmHg     PV Peak grad:    2.4 mmHg AV Mean Grad:      7.0 mmHg      PV Mean grad:    1.0 mmHg LVOT Vmax:         80.40 cm/s    RVOT Peak grad:  5 mmHg LVOT Vmean:        52.500 cm/s LVOT VTI:          0.154 m LVOT/AV VTI ratio: 0.48  AORTA Ao Root diam: 2.50 cm Ao Asc diam:  2.70 cm  MITRAL VALVE                TRICUSPID VALVE MV Area (PHT): 4.06 cm     TR Peak grad:   22.8 mmHg MV Decel Time: 187 msec     TR Vmax:        239.00 cm/s MV E velocity: 111.00 cm/s MV A velocity: 86.95 cm/s   SHUNTS MV E/A ratio:  1.28         Systemic VTI:  0.15 m Systemic Diam: 1.70 cm Pulmonic VTI:  0.264 m Pulmonic Diam: 2.20 cm Qp/Qs:         2.87  Olga Millers MD Electronically signed by Olga Millers MD Signature Date/Time: 07/09/2021/2:44:24 PM    Final               Current Reported Medications:.    Current Meds  Medication Sig   acetaminophen (TYLENOL) 500 MG tablet Take 2 tablets (1,000 mg total) by mouth every 8 (eight) hours as needed. (Patient taking differently: Take 1,000 mg by mouth every 8 (eight) hours as needed for moderate pain (pain score 4-6).)   albuterol (VENTOLIN HFA) 108 (90 Base) MCG/ACT inhaler Inhale 2 puffs into the lungs every 6 (six) hours as needed for shortness of breath or wheezing.   apixaban (ELIQUIS) 5 MG TABS tablet Take 1 tablet by mouth twice daily   aspirin EC 81 MG tablet Take 1 tablet (81 mg total) by mouth daily. Swallow whole.   ezetimibe (ZETIA) 10 MG tablet Take 1 tablet by mouth once daily   furosemide (LASIX) 20 MG tablet Take 1 tablet by mouth once daily   meloxicam (MOBIC) 7.5 MG tablet Take 7.5 mg by  mouth 2 (two) times daily as needed for pain.   metFORMIN (GLUCOPHAGE) 500 MG tablet Take 1 tablet (500 mg total) by mouth 2 (two) times daily. (Patient taking differently: Take 1,000 mg by mouth 2 (two) times daily with a meal.)   metoprolol succinate (TOPROL-XL) 100 MG 24  hr tablet TAKE 1 TABLET BY MOUTH ONCE DAILY WITH FOOD OR IMMEDIATELY FOLLOWING MEAL   nitroGLYCERIN (NITROSTAT) 0.4 MG SL tablet Place 1 tablet (0.4 mg total) under the tongue every 5 (five) minutes as needed for chest pain.   olmesartan (BENICAR) 40 MG tablet Take 1 tablet (40 mg total) by mouth daily.   rosuvastatin (CRESTOR) 5 MG tablet 3 tablet Orally Once a day   [DISCONTINUED] clopidogrel (PLAVIX) 75 MG tablet Take 75 mg by mouth daily.   [DISCONTINUED] isosorbide mononitrate (IMDUR) 30 MG 24 hr tablet Take 0.5 tablets (15 mg total) by mouth daily.    Physical Exam:    VS:  BP (!) 146/74   Pulse (!) 58   Ht 5' 5.5" (1.664 m)   Wt 176 lb (79.8 kg)   SpO2 96%   BMI 28.84 kg/m    Wt Readings from Last 3 Encounters:  03/28/23 176 lb (79.8 kg)  03/18/23 176 lb (79.8 kg)  02/04/23 176 lb (79.8 kg)    GEN: Well nourished, well developed in no acute distress NECK: No JVD; No carotid bruits CARDIAC: RRR, no murmurs, rubs, gallops RESPIRATORY:  Clear to auscultation without rales, wheezing or rhonchi  ABDOMEN: Soft, non-tender, non-distended EXTREMITIES:  No edema; No acute deformity   Asessement and Plan:.    CAD: S/p DES x 3 to the RCA in 2003 (ost-mid RCA, Dist RCA), s/p DES to RCA between previous proximal and mid stent with PTCA-M RCA for ISR in 07/2021.  Presented to ED on 03/23/2023 for left-sided chest pain that was nonradiating and lasting for 1 hour.  She denied overall any associated symptoms aside from feeling a little sweaty. Troponin 30>>32, EKG without acute changes, she has some chronic ST/T wave abnormalities in inferior and lateral leads.  Today she denies any further chest discomfort, she questions if  it was musculoskeletal in nature.  She denies any shortness of breath or chest pain with exertion.  She reports that she has been doing very well overall.  She will continue to monitor for symptoms, if she has repeat episodes will need to consider ischemic evaluation.  Reviewed ED precautions.  Patient reports that she did not stop her Plavix after her last visit with Dr. Tresa Endo, instructed to stop Plavix and start aspirin 81 mg daily per Dr. Landry Dyke last note in 01/2023.  Continue amlodipine 10 mg daily, Eliquis 5 mg daily, ezetimibe 10 mg daily, furosemide 20 mg daily, Toprol-XL 100 mg daily, olmesartan 40 mg daily, Crestor 20 mg daily and sublingual nitroglycerin as needed.  Will increase Imdur to 30 mg daily for better blood pressure control as well.  Paroxysmal atrial fibrillation: EKG in the emergency room shows sinus rhythm, her rate and rhythm is regular on auscultation today.  Denies any feelings of palpitations or atrial fibrillation.  She denies any significant bleeding problems on Eliquis. CHA2DS2-VASc Score = 8 [CHF History: 0, HTN History: 1, Diabetes History: 1, Stroke History: 2, Vascular Disease History: 1, Age Score: 2, Gender Score: 1].  Therefore, the patient's annual risk of stroke is 10.8 %.   Continue metoprolol and Eliquis.  Hypokalemia: Potassium at ED visit was 3.0, will recheck BMP today.  Hypertension: Initial blood pressure today 159/70, on recheck was 146/74.  Will continue current antihypertensive regimen and increase Imdur to 30 mg daily.  Refill of her amlodipine provided.  Instructed patient to monitor her blood pressure for the next 2 weeks and notify the office if consistently elevated above 130/80.  Salty 6 sheet provided.  Hyperlipidemia: Last lipid profile on 02/05/2023 indicated total cholesterol 182, triglycerides 91, HDL 79 and LDL 87.  Rosuvastatin increased to 20 mg daily and she was continued on Zetia.  She will have repeat labs completed in January.  History of  CVA: Continue Eliquis, denies any recurrence.  Type 2 diabetes: Last hemoglobin A1c 6.4 on 08/06/2022.  Monitored and managed per PCP.    Disposition: F/u with Dr. Tresa Endo in February, 2025 as scheduled or sooner if needed.   Signed, Rip Harbour, NP

## 2023-03-28 ENCOUNTER — Encounter: Payer: Self-pay | Admitting: Cardiology

## 2023-03-28 ENCOUNTER — Ambulatory Visit: Payer: Medicare PPO | Attending: Cardiology | Admitting: Cardiology

## 2023-03-28 VITALS — BP 146/74 | HR 58 | Ht 65.5 in | Wt 176.0 lb

## 2023-03-28 DIAGNOSIS — Z8673 Personal history of transient ischemic attack (TIA), and cerebral infarction without residual deficits: Secondary | ICD-10-CM

## 2023-03-28 DIAGNOSIS — E876 Hypokalemia: Secondary | ICD-10-CM

## 2023-03-28 DIAGNOSIS — I10 Essential (primary) hypertension: Secondary | ICD-10-CM | POA: Diagnosis not present

## 2023-03-28 DIAGNOSIS — Z9861 Coronary angioplasty status: Secondary | ICD-10-CM

## 2023-03-28 DIAGNOSIS — I480 Paroxysmal atrial fibrillation: Secondary | ICD-10-CM | POA: Diagnosis not present

## 2023-03-28 DIAGNOSIS — E119 Type 2 diabetes mellitus without complications: Secondary | ICD-10-CM

## 2023-03-28 DIAGNOSIS — E7800 Pure hypercholesterolemia, unspecified: Secondary | ICD-10-CM

## 2023-03-28 DIAGNOSIS — I2510 Atherosclerotic heart disease of native coronary artery without angina pectoris: Secondary | ICD-10-CM | POA: Diagnosis not present

## 2023-03-28 MED ORDER — AMLODIPINE BESYLATE 10 MG PO TABS: 10.0000 mg | ORAL_TABLET | Freq: Every day | ORAL | 3 refills | Status: DC

## 2023-03-28 MED ORDER — ISOSORBIDE MONONITRATE ER 30 MG PO TB24: 30.0000 mg | ORAL_TABLET | Freq: Every day | ORAL | 3 refills | Status: DC

## 2023-03-28 NOTE — Patient Instructions (Addendum)
Medication Instructions:  Stop Plavix  Start aspirin 81 mg once a day  Increase Imdur to 30 mg once a day *If you need a refill on your cardiac medications before your next appointment, please call your pharmacy*  Lab Work: Today we will draw BMP Remember to have fasting Lipid panel and LFT's drawn for January If you have labs (blood work) drawn today and your tests are completely normal, you will receive your results only by: MyChart Message (if you have MyChart) OR A paper copy in the mail If you have any lab test that is abnormal or we need to change your treatment, we will call you to review the results.  Testing/Procedures: No testing  Follow-Up: At [facility], you and your health needs are our priority.  As part of our continuing mission to provide you with exceptional heart care, we have created designated Provider Care Teams.  These Care Teams include your primary Cardiologist (physician) and Advanced Practice Providers (APPs -  Physician Assistants and Nurse Practitioners) who all work together to provide you with the care you need, when you need it.  We recommend signing up for the patient portal called "MyChart".  Sign up information is provided on this After Visit Summary.  MyChart is used to connect with patients for Virtual Visits (Telemedicine).  Patients are able to view lab/test results, encounter notes, upcoming appointments, etc.  Non-urgent messages can be sent to your provider as well.   To learn more about what you can do with MyChart, go to [email or web address].    Your next appointment:   February 25th at 8:20 AM with Nicki Guadalajara, MD     Other Instructions Keep blood pressure log for the next 2 weeks. Call our office for blood pressure greater than 130/80 consistently.

## 2023-03-29 LAB — BASIC METABOLIC PANEL
BUN/Creatinine Ratio: 12 (ref 12–28)
BUN: 15 mg/dL (ref 8–27)
CO2: 25 mmol/L (ref 20–29)
Calcium: 10 mg/dL (ref 8.7–10.3)
Chloride: 104 mmol/L (ref 96–106)
Creatinine, Ser: 1.29 mg/dL — ABNORMAL HIGH (ref 0.57–1.00)
Glucose: 154 mg/dL — ABNORMAL HIGH (ref 70–99)
Potassium: 3.1 mmol/L — ABNORMAL LOW (ref 3.5–5.2)
Sodium: 143 mmol/L (ref 134–144)
eGFR: 42 mL/min/{1.73_m2} — ABNORMAL LOW (ref >59)

## 2023-03-31 ENCOUNTER — Telehealth: Payer: Self-pay

## 2023-03-31 DIAGNOSIS — E876 Hypokalemia: Secondary | ICD-10-CM

## 2023-03-31 NOTE — Telephone Encounter (Signed)
Called patient to advise of below got husband he will tell wife to call our office back please message me if patient calls back.

## 2023-03-31 NOTE — Telephone Encounter (Signed)
-----   Message from Rip Harbour sent at 03/31/2023  7:50 AM EST ----- Please let Ms. Sorrels know that her kidney function is slightly decreased, she needs to stay well hydrated. Her potassium level remains decreased please have her start potassium chloride twice daily for three days then once daily after. Please recheck a BMET in two weeks.

## 2023-04-01 MED ORDER — POTASSIUM CHLORIDE CRYS ER 20 MEQ PO TBCR: 20.0000 meq | EXTENDED_RELEASE_TABLET | Freq: Every day | ORAL | 3 refills | Status: DC

## 2023-05-15 ENCOUNTER — Other Ambulatory Visit: Payer: Self-pay | Admitting: Cardiovascular Disease

## 2023-05-20 ENCOUNTER — Encounter (HOSPITAL_COMMUNITY): Payer: Self-pay | Admitting: Emergency Medicine

## 2023-05-20 ENCOUNTER — Other Ambulatory Visit: Payer: Self-pay

## 2023-05-20 ENCOUNTER — Telehealth: Payer: Self-pay | Admitting: Cardiology

## 2023-05-20 ENCOUNTER — Emergency Department (HOSPITAL_COMMUNITY)
Admission: EM | Admit: 2023-05-20 | Discharge: 2023-05-21 | Disposition: A | Discharge status: Home / Self Care | Payer: Medicare PPO | Attending: Emergency Medicine | Admitting: Emergency Medicine

## 2023-05-20 DIAGNOSIS — I509 Heart failure, unspecified: Secondary | ICD-10-CM | POA: Insufficient documentation

## 2023-05-20 DIAGNOSIS — R799 Abnormal finding of blood chemistry, unspecified: Secondary | ICD-10-CM | POA: Diagnosis present

## 2023-05-20 DIAGNOSIS — E1122 Type 2 diabetes mellitus with diabetic chronic kidney disease: Secondary | ICD-10-CM | POA: Insufficient documentation

## 2023-05-20 DIAGNOSIS — I130 Hypertensive heart and chronic kidney disease with heart failure and stage 1 through stage 4 chronic kidney disease, or unspecified chronic kidney disease: Secondary | ICD-10-CM | POA: Diagnosis not present

## 2023-05-20 DIAGNOSIS — N179 Acute kidney failure, unspecified: Secondary | ICD-10-CM | POA: Diagnosis not present

## 2023-05-20 DIAGNOSIS — Z7901 Long term (current) use of anticoagulants: Secondary | ICD-10-CM | POA: Diagnosis not present

## 2023-05-20 DIAGNOSIS — Z79899 Other long term (current) drug therapy: Secondary | ICD-10-CM | POA: Diagnosis not present

## 2023-05-20 DIAGNOSIS — Z7984 Long term (current) use of oral hypoglycemic drugs: Secondary | ICD-10-CM | POA: Diagnosis not present

## 2023-05-20 DIAGNOSIS — N189 Chronic kidney disease, unspecified: Secondary | ICD-10-CM | POA: Diagnosis not present

## 2023-05-20 DIAGNOSIS — I2510 Atherosclerotic heart disease of native coronary artery without angina pectoris: Secondary | ICD-10-CM | POA: Insufficient documentation

## 2023-05-20 LAB — LIPID PANEL
Chol/HDL Ratio: 3.1 {ratio} (ref 0.0–4.4)
Cholesterol, Total: 251 mg/dL — ABNORMAL HIGH (ref 100–199)
HDL: 82 mg/dL (ref >39)
LDL Chol Calc (NIH): 147 mg/dL — ABNORMAL HIGH (ref 0–99)
Triglycerides: 125 mg/dL (ref 0–149)
VLDL Cholesterol Cal: 22 mg/dL (ref 5–40)

## 2023-05-20 LAB — COMPREHENSIVE METABOLIC PANEL
ALT: 12 [IU]/L (ref 0–32)
ALT: 15 U/L (ref 0–44)
AST: 13 [IU]/L (ref 0–40)
AST: 17 U/L (ref 15–41)
Albumin: 4.3 g/dL (ref 3.8–4.8)
Albumin: 3.7 g/dL (ref 3.5–5.0)
Alkaline Phosphatase: 86 [IU]/L (ref 44–121)
Alkaline Phosphatase: 63 U/L (ref 38–126)
Anion gap: 9 (ref 5–15)
BUN/Creatinine Ratio: 20 (ref 12–28)
BUN: 38 mg/dL — ABNORMAL HIGH (ref 8–27)
BUN: 44 mg/dL — ABNORMAL HIGH (ref 8–23)
Bilirubin Total: 0.3 mg/dL (ref 0.0–1.2)
CO2: 16 mmol/L — ABNORMAL LOW (ref 20–29)
CO2: 20 mmol/L — ABNORMAL LOW (ref 22–32)
Calcium: 10.4 mg/dL — ABNORMAL HIGH (ref 8.7–10.3)
Calcium: 10.1 mg/dL (ref 8.9–10.3)
Chloride: 106 mmol/L (ref 96–106)
Chloride: 108 mmol/L (ref 98–111)
Creatinine, Ser: 1.89 mg/dL — ABNORMAL HIGH (ref 0.57–1.00)
Creatinine, Ser: 2 mg/dL — ABNORMAL HIGH (ref 0.44–1.00)
GFR, Estimated: 25 mL/min — ABNORMAL LOW (ref >60)
Globulin, Total: 2.4 g/dL (ref 1.5–4.5)
Glucose, Bld: 119 mg/dL — ABNORMAL HIGH (ref 70–99)
Glucose: 115 mg/dL — ABNORMAL HIGH (ref 70–99)
Potassium: 5.8 mmol/L (ref 3.5–5.2)
Potassium: 4.5 mmol/L (ref 3.5–5.1)
Sodium: 137 mmol/L (ref 134–144)
Sodium: 137 mmol/L (ref 135–145)
Total Bilirubin: 0.5 mg/dL (ref 0.0–1.2)
Total Protein: 6.7 g/dL (ref 6.0–8.5)
Total Protein: 6.7 g/dL (ref 6.5–8.1)
eGFR: 27 mL/min/{1.73_m2} — ABNORMAL LOW (ref >59)

## 2023-05-20 LAB — URINALYSIS, ROUTINE W REFLEX MICROSCOPIC
Bilirubin Urine: NEGATIVE
Glucose, UA: NEGATIVE mg/dL
Hgb urine dipstick: NEGATIVE
Ketones, ur: NEGATIVE mg/dL
Leukocytes,Ua: NEGATIVE
Nitrite: NEGATIVE
Protein, ur: NEGATIVE mg/dL
Specific Gravity, Urine: 1.009 (ref 1.005–1.030)
pH: 5 (ref 5.0–8.0)

## 2023-05-20 LAB — CBC
HCT: 33.6 % — ABNORMAL LOW (ref 36.0–46.0)
Hemoglobin: 11 g/dL — ABNORMAL LOW (ref 12.0–15.0)
MCH: 29.3 pg (ref 26.0–34.0)
MCHC: 32.7 g/dL (ref 30.0–36.0)
MCV: 89.4 fL (ref 80.0–100.0)
Platelets: 292 10*3/uL (ref 150–400)
RBC: 3.76 MIL/uL — ABNORMAL LOW (ref 3.87–5.11)
RDW: 13.9 % (ref 11.5–15.5)
WBC: 9.3 10*3/uL (ref 4.0–10.5)
nRBC: 0 % (ref 0.0–0.2)

## 2023-05-20 NOTE — ED Triage Notes (Signed)
Patient sent from PCP for elevated potassium of 5.8 and AKI. Denies pain.

## 2023-05-20 NOTE — ED Provider Triage Note (Signed)
 Emergency Medicine Provider Triage Evaluation Note  Debbie [name] , a 78 y.o. female  was evaluated in triage.  Pt complains of abnormal labs.  Patient was advised to come to the emergency department for evaluation for concerns of hyperkalemia and AKI.  Patient denies any symptoms at this time.  No chest pain or shortness of breath.  No leg swelling, abdominal pain or distention, nausea, vomiting or diarrhea..  Review of Systems  Positive: As above Negative: As above  Physical Exam  BP 117/79 (BP Location: Right Arm)   Pulse 78   Temp 98.3 F (36.8 C) (Oral)   Resp 18   Ht 5' 5 (1.651 m)   Wt 78 kg   SpO2 100%   BMI 28.62 kg/m  Gen:   Awake, no distress   Resp:  Normal effort, CTAB  MSK:   Moves extremities without difficulty  Other:  No leg swelling  Medical Decision Making  Medically screening exam initiated at 10:43 PM.  Appropriate orders placed.  Debbie [name] was informed that the remainder of the evaluation will be completed by another provider, this initial triage assessment does not replace that evaluation, and the importance of remaining in the ED until their evaluation is complete.  Chart review shows evidence of AKI and hyperkalemia.   Jensine Luz A, PA-C 05/20/23 [id number]

## 2023-05-20 NOTE — Telephone Encounter (Signed)
 Telephone outpatient note  Patient ID: Debbie [name] MRN: [id number]; DOB: February 03, 1946 [facility] Providers Cardiologist:  Debby Sor, MD        Debbie [name] is a 78 y.o. female.  Notified by [facility] regarding critically elevated K. Labs reviewed with evidence of new AKI with metabolic acidosis and hyperkalemia. Called patient's mobile number at [phone]. Spoke with patient's partner and recommended she present to the [facility] or local ER for repeat labs and further evaluation and treatment.    Current Outpatient Medications  Medication Instructions   acetaminophen  (TYLENOL ) 1,000 mg, Oral, Every 8 hours PRN   albuterol  (VENTOLIN  HFA) 108 (90 Base) MCG/ACT inhaler 2 puffs, Every 6 hours PRN   amLODipine  (NORVASC ) 10 mg, Oral, Daily   aspirin  EC 81 mg, Oral, Daily, Swallow whole.   Eliquis  5 mg, Oral, 2 times daily   ezetimibe  (ZETIA ) 10 mg, Oral, Daily   furosemide  (LASIX ) 20 mg, Oral, Daily   isosorbide  mononitrate (IMDUR ) 30 mg, Oral, Daily   meloxicam (MOBIC) 7.5 mg, 2 times daily PRN   metFORMIN  (GLUCOPHAGE ) 500 mg, Oral, 2 times daily   metoprolol  succinate (TOPROL -XL) 100 MG 24 hr tablet TAKE 1 TABLET BY MOUTH ONCE DAILY WITH FOOD OR IMMEDIATELY FOLLOWING MEAL   nitroGLYCERIN  (NITROSTAT ) 0.4 mg, Sublingual, Every 5 min PRN   olmesartan  (BENICAR ) 40 mg, Oral, Daily   potassium chloride  SA (KLOR-CON  M) 20 MEQ tablet 20 mEq, Oral, Daily   rosuvastatin  (CRESTOR ) 5 MG tablet 3 tablet Orally Once a day   rosuvastatin  (CRESTOR ) 20 mg, Oral, Daily   No Known Allergies   For questions or updates, please contact [facility] Please consult [email or web address] for contact info under   Earlene CHRISTELLA Cluster, MD  05/20/2023 8:29 PM

## 2023-05-21 LAB — BASIC METABOLIC PANEL
Anion gap: 8 (ref 5–15)
BUN: 35 mg/dL — ABNORMAL HIGH (ref 8–23)
CO2: 17 mmol/L — ABNORMAL LOW (ref 22–32)
Calcium: 9.7 mg/dL (ref 8.9–10.3)
Chloride: 107 mmol/L (ref 98–111)
Creatinine, Ser: 1.59 mg/dL — ABNORMAL HIGH (ref 0.44–1.00)
GFR, Estimated: 33 mL/min — ABNORMAL LOW (ref >60)
Glucose, Bld: 89 mg/dL (ref 70–99)
Potassium: 4.9 mmol/L (ref 3.5–5.1)
Sodium: 132 mmol/L — ABNORMAL LOW (ref 135–145)

## 2023-05-21 MED ORDER — SODIUM CHLORIDE 0.9 % IV BOLUS
1000.0000 mL | Freq: Once | INTRAVENOUS | Status: AC
Start: 2023-05-21 — End: 2023-05-21
  Administered 2023-05-21: 1000 mL via INTRAVENOUS

## 2023-05-21 NOTE — ED Provider Notes (Signed)
 [place] [facility] Provider Note   CSN: [id number] Arrival date & time: 05/20/23  2051     History  Chief Complaint  Patient presents with   Abnormal Labs    Debbie [name] is a 78 y.o. female.  With a history of heart failure, hypertension, type 2 diabetes, CKD and CAD who presents to the ED for laboratory abnormality.  Patient was seen yesterday by her cardiologist and sent here for further evaluation of hyperkalemia and AKI.  She is asymptomatic.  Denies fevers chills chest pain recent illness shortness of breath nausea vomiting abdominal pain.  No new urinary symptoms.  HPI     Home Medications Prior to Admission medications   Medication Sig Start Date End Date Taking? Authorizing Provider  acetaminophen  (TYLENOL ) 500 MG tablet Take 2 tablets (1,000 mg total) by mouth every 8 (eight) hours as needed. Patient taking differently: Take 1,000 mg by mouth every 8 (eight) hours as needed for moderate pain (pain score 4-6). 03/30/20   Rosalva Sawyer, MD  albuterol  (VENTOLIN  HFA) 108 [phone] Base) MCG/ACT inhaler Inhale 2 puffs into the lungs every 6 (six) hours as needed for shortness of breath or wheezing. 01/06/23   [provider]  amLODipine  (NORVASC ) 10 MG tablet Take 1 tablet (10 mg total) by mouth daily. 03/28/23 04/27/23  West, Katlyn D, NP  apixaban  (ELIQUIS ) 5 MG TABS tablet Take 1 tablet by mouth twice daily 10/07/22   Burnard Debby LABOR, MD  aspirin  EC 81 MG tablet Take 1 tablet (81 mg total) by mouth daily. Swallow whole. 02/04/23   Burnard Debby LABOR, MD  ezetimibe  (ZETIA ) 10 MG tablet Take 1 tablet by mouth once daily 12/30/22   Burnard Debby LABOR, MD  furosemide  (LASIX ) 20 MG tablet Take 1 tablet by mouth once daily 05/15/23   Burnard Debby LABOR, MD  isosorbide  mononitrate (IMDUR ) 30 MG 24 hr tablet Take 1 tablet (30 mg total) by mouth daily. 03/28/23   West, Katlyn D, NP  meloxicam (MOBIC) 7.5 MG tablet Take 7.5 mg by mouth 2 (two) times daily as  needed for pain. 11/26/22   [provider]  metFORMIN  (GLUCOPHAGE ) 500 MG tablet Take 1 tablet (500 mg total) by mouth 2 (two) times daily. Patient taking differently: Take 1,000 mg by mouth 2 (two) times daily with a meal. 08/02/21   Pokhrel, Laxman, MD  metoprolol  succinate (TOPROL -XL) 100 MG 24 hr tablet TAKE 1 TABLET BY MOUTH ONCE DAILY WITH FOOD OR IMMEDIATELY FOLLOWING MEAL 11/14/22   Walker, Caitlin S, NP  nitroGLYCERIN  (NITROSTAT ) 0.4 MG SL tablet Place 1 tablet (0.4 mg total) under the tongue every 5 (five) minutes as needed for chest pain. 04/05/21   Burnard Debby LABOR, MD  olmesartan  (BENICAR ) 40 MG tablet Take 1 tablet (40 mg total) by mouth daily. 09/12/22   Burnard Debby LABOR, MD  potassium chloride  SA (KLOR-CON  M) 20 MEQ tablet Take 1 tablet (20 mEq total) by mouth daily. 04/01/23   West, Katlyn D, NP  rosuvastatin  (CRESTOR ) 20 MG tablet Take 1 tablet (20 mg total) by mouth daily. Patient not taking: Reported on 03/28/2023 02/14/23 05/15/23  Burnard Debby LABOR, MD  rosuvastatin  (CRESTOR ) 5 MG tablet 3 tablet Orally Once a day    [provider]      Allergies    Patient has no known allergies.    Review of Systems   Review of Systems  Physical Exam Updated Vital Signs BP (!) 154/67 (BP  Location: Right Arm)   Pulse 66   Temp 98.2 F (36.8 C) (Oral)   Resp 18   Ht 5' 5 (1.651 m)   Wt 78 kg   SpO2 100%   BMI 28.62 kg/m  Physical Exam Vitals and nursing note reviewed.  HENT:     Head: Normocephalic and atraumatic.  Eyes:     Pupils: Pupils are equal, round, and reactive to light.  Cardiovascular:     Rate and Rhythm: Normal rate and regular rhythm.  Pulmonary:     Effort: Pulmonary effort is normal.     Breath sounds: Normal breath sounds.  Abdominal:     Palpations: Abdomen is soft.     Tenderness: There is no abdominal tenderness.  Skin:    General: Skin is warm and dry.  Neurological:     Mental Status: She is alert.  Psychiatric:        Mood and  Affect: Mood normal.     ED Results / Procedures / Treatments   Labs (all labs ordered are listed, but only abnormal results are displayed) Labs Reviewed  CBC - Abnormal; Notable for the following components:      Result Value   RBC 3.76 (*)    Hemoglobin 11.0 (*)    HCT 33.6 (*)    All other components within normal limits  COMPREHENSIVE METABOLIC PANEL - Abnormal; Notable for the following components:   CO2 20 (*)    Glucose, Bld 119 (*)    BUN 44 (*)    Creatinine, Ser 2.00 (*)    GFR, Estimated 25 (*)    All other components within normal limits  URINALYSIS, ROUTINE W REFLEX MICROSCOPIC - Abnormal; Notable for the following components:   Color, Urine STRAW (*)    All other components within normal limits  BASIC METABOLIC PANEL - Abnormal; Notable for the following components:   Sodium 132 (*)    CO2 17 (*)    BUN 35 (*)    Creatinine, Ser 1.59 (*)    GFR, Estimated 33 (*)    All other components within normal limits    EKG EKG Interpretation Date/Time:  Tuesday May 20 2023 23:03:46 EST Ventricular Rate:  77 PR Interval:  142 QRS Duration:  86 QT Interval:  352 QTC Calculation: 398 R Axis:   44  Text Interpretation: Sinus rhythm with Premature atrial complexes with Abberant conduction Possible Lateral infarct , age undetermined Possible Inferior infarct , age undetermined Abnormal ECG When compared with ECG of 18-Mar-2023 09:41, HEART RATE has increased Premature ventricular complexes are now present Confirmed by Raford Lenis ([id number]) on 05/21/2023 12:25:01 AM  Radiology No results found.  Procedures Procedures    Medications Ordered in ED Medications  sodium chloride  0.9 % bolus 1,000 mL (0 mLs Intravenous Stopped 05/21/23 1220)    ED Course/ Medical Decision Making/ A&P Clinical Course as of 05/21/23 1423  Wed May 21, 2023  1419 Patient has received 1 L IV fluids and has been drinking plenty of water  here.  Repeat BMP shows improvement in creatinine.   Down to 1.59 from 2.0.  Sodium is slightly low now at 132 probably from IV fluids and drinking water .  Informed patient of laboratory findings.  At this time she has been here for nearly 18 hours and wants to go home with a plan to have her labs rechecked by her primary care doctor.  Given the drastic improvement in her renal function I think this is reasonable.  The most likely cause of the AKI would be prerenal dehydration.  Counseled the patient to drink plenty of fluids and follow-up with her primary care doctor as discussed.  Stable for discharge at this time [MP]    Clinical Course User Index [MP] Pamella Ozell LABOR, DO                                 Medical Decision Making 78 year old female sent in from cardiologist office given concern for AKI and hyperkalemia.  Asymptomatic.  No chest pain shortness of breath nausea vomiting fevers chills or recent illness.  Benign physical exam.  Afebrile and normotensive.  Has been waiting overnight in the waiting room for over 12 hours.  Initial laboratory workup revealed no hyperkalemia perhaps this was a laboratory error from outpatient labs.  There does appear to be an AKI for which we will give IV fluids and recheck metabolic panel.  EKG shows no evidence of dysrhythmia, ischemic changes or findings consistent with hyperkalemia.  CBC is unremarkable and UA is negative.  Amount and/or Complexity of Data Reviewed Labs: ordered.           Final Clinical Impression(s) / ED Diagnoses Final diagnoses:  AKI (acute kidney injury) [facility])    Rx / DC Orders ED Discharge Orders     None         Pamella Ozell LABOR, DO 05/21/23 1423

## 2023-05-21 NOTE — ED Notes (Signed)
 Pt. Debbie [name] out to sit with her family in the waiting room

## 2023-05-21 NOTE — Discharge Instructions (Addendum)
 He was seen in the emergency department for elevated potassium and elevated kidney function Your potassium was normal here Your kidney function was elevated but then improved after IV fluids and hydration On repeat testing your sodium was a little low Is important to keep well-hydrated at home Follow-up with your primary care doctor and cardiologist for reevaluation and to have your blood work rechecked Continue taking all previous prescribed medications Return to the emergency department for chest pain trouble breathing or any other concerns

## 2023-05-21 NOTE — ED Notes (Addendum)
 Pt. Ambulated to the bathroom, gait steady, denies any problems with urinating.

## 2023-06-03 ENCOUNTER — Encounter: Payer: Self-pay | Admitting: Obstetrics

## 2023-06-03 ENCOUNTER — Ambulatory Visit: Payer: Medicare PPO | Admitting: Obstetrics

## 2023-06-03 VITALS — BP 123/80 | HR 73 | Ht 62.6 in | Wt 174.0 lb

## 2023-06-03 DIAGNOSIS — N3941 Urge incontinence: Secondary | ICD-10-CM

## 2023-06-03 DIAGNOSIS — R159 Full incontinence of feces: Secondary | ICD-10-CM | POA: Insufficient documentation

## 2023-06-03 DIAGNOSIS — R351 Nocturia: Secondary | ICD-10-CM | POA: Diagnosis not present

## 2023-06-03 DIAGNOSIS — N3946 Mixed incontinence: Secondary | ICD-10-CM | POA: Insufficient documentation

## 2023-06-03 DIAGNOSIS — N952 Postmenopausal atrophic vaginitis: Secondary | ICD-10-CM

## 2023-06-03 DIAGNOSIS — Z8673 Personal history of transient ischemic attack (TIA), and cerebral infarction without residual deficits: Secondary | ICD-10-CM | POA: Diagnosis not present

## 2023-06-03 LAB — POCT URINALYSIS DIPSTICK
Bilirubin, UA: NEGATIVE
Blood, UA: NEGATIVE
Glucose, UA: NEGATIVE
Ketones, UA: NEGATIVE
Leukocytes, UA: NEGATIVE
Nitrite, UA: NEGATIVE
Protein, UA: NEGATIVE
Spec Grav, UA: 1.015 (ref 1.010–1.025)
Urobilinogen, UA: 0.2 U/dL
pH, UA: 5.5 (ref 5.0–8.0)

## 2023-06-03 MED ORDER — GEMTESA 75 MG PO TABS: 75.0000 mg | ORAL_TABLET | Freq: Every day | ORAL | 2 refills | Status: DC

## 2023-06-03 MED ORDER — GEMTESA 75 MG PO TABS: 75.0000 mg | ORAL_TABLET | Freq: Every day | ORAL | Status: DC

## 2023-06-03 MED ORDER — ESTRADIOL 0.1 MG/GM VA CREA: 0.5000 g | TOPICAL_CREAM | VAGINAL | 3 refills | Status: DC

## 2023-06-03 NOTE — Patient Instructions (Addendum)
 For treatment of stress urinary incontinence, which is leakage with physical activity/movement/strainging/coughing, we discussed expectant management versus nonsurgical options versus surgery. Nonsurgical options include weight loss, physical therapy, as well as a pessary.  Surgical options include a midurethral sling, which is a synthetic mesh sling that acts like a hammock under the urethra to prevent leakage of urine, a Burch urethropexy, and transurethral injection of a bulking agent.   We discussed the symptoms of overactive bladder (OAB), which include urinary urgency, urinary frequency, night-time urination, with or without urge incontinence.  We discussed management including behavioral therapy (decreasing bladder irritants by following a bladder diet, urge suppression strategies, timed voids, bladder retraining), physical therapy, medication; and for refractory cases posterior tibial nerve stimulation, sacral neuromodulation, and intravesical botulinum toxin injection.   For Beta-3 agonist medication, we discussed the potential side effect of elevated blood pressure which is more likely to occur in individuals with uncontrolled hypertension. You were given samples for Myrbetriq 75 mg.  It can take a month to start working so give it time, but if you have bothersome side effects call sooner and we can try a different medication.  Call us if you have trouble filling the prescription or if it's not covered by your insurance.  For night time frequency: - avoid fluid intake after 6pm - elevated your feet during the day or use compression socks to reduce lower extremity swelling - switch your diuretic (e.g. furosemide) dosing to 2pm - due to snoring/continue CPAP for sleep apnea  Please return for bladder testing with a full bladder.  For symptomatic vaginal atrophy options include lubrication with a water-based lubricant, personal hygiene measures and barrier protection against wetness, and  estrogen replacement in the form of vaginal cream, vaginal tablets, or a time-released vaginal ring.     For vaginal atrophy (thinning of the vaginal tissue that can cause dryness and burning) and UTI prevention we discussed estrogen replacement in the form of vaginal cream.   Start vaginal estrogen therapy nightly for two weeks then 2 times weekly at night. This can be placed with your finger or an applicator inside the vagina and around the urethra.  Please let us know if the prescription is too expensive and we can look for alternative options.   Is vaginal estrogen therapy safe for me? Vaginal estrogen preparations act on the vaginal skin, and only a very tiny amount is absorbed into the bloodstream (0.01%).  They work in a similar way to hand or face cream.  There is minimal absorption and they are therefore perfectly safe. If you have had breast cancer and have persistent troublesome symptoms which aren't settling with vaginal moisturisers and lubricants, local estrogen treatment may be a possibility, but consultation with your oncologist should take place first.

## 2023-06-03 NOTE — Progress Notes (Unsigned)
 New Patient Evaluation and Consultation  Referring Provider: Gerald Leitz, MD PCP: Salli Real, MD Date of Service: 06/03/2023  SUBJECTIVE Chief Complaint: New Patient (Initial Visit) Debbie [name] is a 78 y.o. female here today for urinary incontinence.)  History of Present Illness: Debbie [name] is a 78 y.o. Black or African-American female seen in consultation at the request of Dr Richardson Dopp for evaluation of urinary incontinence.    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 Richardson Dopp, reports leakage started 2 months postop. Wears depends for the past 4 years, myrbetriq 50mg  with some relief around 2 years ago. Stopped working 1 year ago 07/04/21 underwent spinal surgery with allo and autograft and hardware by Dr. Yetta Barre for Spondylolisthesis L3-4 L4-5, spinal stenosis L3-4 L4-5, back pain with leg pain  Recent ED visit for AKI and hyperkalemia 5.8 on 05/20/23 and 03/18/23 for chest pain Fall in 09/14/21  Unable to walk in 2023, now ambulates without assistance T2DM on metformin, last HbA1C 6.4 on 08/06/22 Eliquis with h/o CVA around 27 yrs ago per pt, MI in 2001 CAD s/p DES x 3-RCA in 2003, s/p DES to o-pRCA, PTCA-mRCA (ISR) in 07/2021, paroxysmal atrial fibrillation on Eliquis  Takes Lasix in am Stage III CKD, last Cr 1.59, GFR 34 on 05/21/23 History of spinal stenosis with leg weakness and low back pain  Urinary Symptoms: Leaks urine with going from sitting to standing and with movement to the bathroom, getting out of the car with urgency Leaks 3-4 time(s) per days with urgency, with larger volume leakage and more bothersome Leakage 1x/day with coughing/sneezing/laughing Pad use:  2-3  adult diapers per day.  Morning > night Patient is bothered by UI symptoms.  Day time voids 5.  Nocturia: 5 times per night to void. Leaks at night Reports snoring, denies OSA Drinks glass  of milk or soda at night Sleeps at 8pm Voiding dysfunction: unclear if she empties bladder well due to dribbling Patient does not use a catheter to empty bladder.  When urinating, patient feels dribbling after finishing and the need to urinate multiple times in a row Intermittent double voiding with improvement  Drinks: 48oz water per day, 1 can of coke soda at night  UTIs:  0  UTI's in the last year.   Denies history of blood in urine, kidney or bladder stones, bladder cancer, and kidney cancer Outpatient treatment for pyelonephritis with "sulfur" drugs No results found for the last 90 days.   Pelvic Organ Prolapse Symptoms:                  Patient Denies a feeling of a bulge the vaginal area.   Bowel Symptom: Bowel movements: 1 time(s) per day Stool consistency: soft  Straining: no.  Splinting: no.  Incomplete evacuation: no.  Patient denies accidental bowel leakage / fecal incontinence Bowel regimen: none Last colonoscopy: 12/2022 per pt, Date 07/16/17 with 4 sessile polyps, diverticulosis, internal hemorrhoids, Results Tubular adenoma and hyperplastic polyp. Repeat in 3-5 years or 10 years if benign per report [facility]          Current Care Gaps     Colonoscopy (Every 5 Years) Overdue since 07/17/2022    07/16/2017  COLONOSCOPY   Only the first 1 history entries have been loaded, but more history exists.  Sexual Function Sexually active: no.  Sexual orientation: Straight Pain with sex: has discomfort due to dryness  Pelvic Pain Denies pelvic pain   Past Medical History:  Past Medical History:  Diagnosis Date   Arthritis    CAD (coronary artery disease) 11/1999   RCA stent X3, patent '06. Nuc low risk 9/13   Cataract    beginning stages   Diabetes mellitus without complication (HCC)    Dysrhythmia    History of stress test 01/08/2012   Showed minimal anterior thinning not felt to be significant.   Hx of echocardiogram 12/12/2011    EF>55%   Hypercholesteremia    Hypertension    Myocardial infarction [facility])    18 years ago    age 25   PAF (paroxysmal atrial fibrillation) (HCC) 11/2011   SSS component with some bradycardia   Stroke (HCC) 1998   Vitamin D deficiency      Past Surgical History:   Past Surgical History:  Procedure Laterality Date   ABDOMINAL HYSTERECTOMY  2021   CATARACT EXTRACTION, BILATERAL     CHOLECYSTECTOMY     COLONOSCOPY     CORONARY ANGIOGRAM  08/2004   patent stents   CORONARY ANGIOPLASTY WITH STENT PLACEMENT  11/1999   X 3   CORONARY STENT INTERVENTION N/A 07/31/2021   Procedure: CORONARY STENT INTERVENTION;  Surgeon: Lennette Bihari, MD;  Location: [facility] INVASIVE CV [facility];  Service: Cardiovascular;  Laterality: N/A;   EYE SURGERY     LEFT HEART CATH AND CORONARY ANGIOGRAPHY N/A 07/31/2021   Procedure: LEFT HEART CATH AND CORONARY ANGIOGRAPHY;  Surgeon: Lennette Bihari, MD;  Location: [facility] CV [facility];  Service: Cardiovascular;  Laterality: N/A;   MULTIPLE TOOTH EXTRACTIONS     ROBOTIC ASSISTED TOTAL HYSTERECTOMY WITH BILATERAL SALPINGO OOPHERECTOMY Bilateral 03/29/2020   Procedure: XI ROBOTIC ASSISTED TOTAL HYSTERECTOMY WITH BILATERAL SALPINGO OOPHORECTOMY;  Surgeon: Gerald Leitz, MD;  Location: [facility];  Service: Gynecology;  Laterality: Bilateral;  Tracie to RNFA confirmed on 02/16/20 CS     Past OB/GYN History: OB History  Gravida Para Term Preterm AB Living  1 1 1   1   SAB IAB Ectopic Multiple Live Births      1    # Outcome Date GA Lbr Len/2nd Weight Sex Type Anes PTL Lv  1 Term     M Vag-Spont   LIV    Vaginal deliveries: 1, 6lb 7oz Forceps/ Vacuum deliveries: 0, Cesarean section: 0 Menopausal: Yes, at age 47, Denies vaginal bleeding since menopause Contraception: BTL. Last pap smear was prior to hysterectomy.  Any history of abnormal pap smears: no. No results found for: "DIAGPAP", "HPVHIGH", "ADEQPAP"  Medications: Patient has a current medication list which includes the  following prescription(s): acetaminophen, albuterol, amlodipine, eliquis, aspirin ec, [START ON 06/05/2023] estradiol, ezetimibe, furosemide, isosorbide mononitrate, meloxicam, metformin, metoprolol succinate, multivitamin, nitroglycerin, olmesartan, potassium chloride sa, rosuvastatin, gemtesa, and gemtesa.   Allergies: Patient has no known allergies.   Social History:  Social History   Tobacco Use   Smoking status: Some Days    Types: Cigarettes   Smokeless tobacco: Never   Tobacco comments:    2-3 cigarettes per day.  Vaping Use   Vaping status: Never Used  Substance Use Topics   Alcohol use: Never   Drug use: No    Relationship status: married Patient lives with her husband.   Patient is not employed. Regular exercise: No History of abuse: No  Family History:   Family History  Problem Relation Age of Onset   Uterine cancer Mother    Diabetes type II Sister    Heart disease Sister    Colon cancer Neg Hx    Colon polyps Neg Hx    Esophageal cancer Neg Hx    Rectal cancer Neg Hx    Stomach cancer Neg Hx    Breast cancer Neg Hx    Bladder Cancer Neg Hx      Review of Systems: Review of Systems  Constitutional:  Negative for fever, malaise/fatigue and weight loss.  Respiratory:  Negative for cough, shortness of breath and wheezing.   Cardiovascular:  Positive for chest pain, palpitations and leg swelling.  Gastrointestinal:  Negative for abdominal pain and blood in stool.  Genitourinary:  Negative for hematuria.  Skin:  Negative for rash.  Neurological:  Negative for dizziness, weakness and headaches.  Endo/Heme/Allergies:  Does not bruise/bleed easily.  Psychiatric/Behavioral:  Negative for depression. The patient is not nervous/anxious.      OBJECTIVE Physical Exam: Vitals:   06/03/23 0811  BP: 123/80  Pulse: 73  Weight: 174 lb (78.9 kg)  Height: 5' 2.6" (1.59 m)    Physical Exam Constitutional:      General: She is not in acute distress.     Appearance: Normal appearance.  Genitourinary:     Bladder and urethral meatus normal.     No lesions in the vagina.     Right Labia: No rash, tenderness, lesions, skin changes or Bartholin's cyst.    Left Labia: No tenderness, lesions, skin changes, Bartholin's cyst or rash.    No vaginal discharge, erythema, tenderness, bleeding, ulceration or granulation tissue.     No vaginal prolapse present.    Moderate vaginal atrophy present.     Right Adnexa: not tender, not full and no mass present.    Left Adnexa: not tender, not full and no mass present.    Cervix is absent.     Uterus is absent.     Urethral meatus caruncle not present.    No urethral prolapse, tenderness, mass, hypermobility, discharge or stress urinary incontinence with cough stress test present.     Bladder is not tender, urgency on palpation not present and masses not present.      Pelvic Floor: Levator muscle strength is 3/5.    Levator ani not tender, obturator internus not tender, no asymmetrical contractions present and no pelvic spasms present.    Symmetrical pelvic sensation, anal wink present and BC reflex present. Cardiovascular:     Rate and Rhythm: Normal rate.  Pulmonary:     Effort: Pulmonary effort is normal. No respiratory distress.  Abdominal:     General: There is no distension.     Palpations: Abdomen is soft. There is no mass.     Tenderness: There is no abdominal tenderness.     Hernia: No hernia is present.    Neurological:     Mental Status: She is alert.  Vitals reviewed. Exam conducted with a chaperone present.      POP-Q:   POP-Q  -3                                            Aa   -3  Ba  -7                                              C   1                                            Gh  4                                            Pb  7                                            tvl   -3                                             Ap  -3                                            Bp                                                 D   Post-Void Residual (PVR) by Bladder Scan: In order to evaluate bladder emptying, we discussed obtaining a postvoid residual and patient agreed to this procedure.  Procedure: The ultrasound unit was placed on the patient's abdomen in the suprapubic region after the patient had voided.    Post Void Residual - 06/03/23 0814       Post Void Residual   Post Void Residual 48 mL            Straight Catheterization Procedure for PVR: After verbal consent was obtained from the patient for catheterization to assess bladder emptying and residual volume the urethra and surrounding tissues were prepped with betadine and an in and out catheterization was performed.  PVR was 40mL.  Urine appeared clear yellow. The patient tolerated the procedure well.   Laboratory Results: Lab Results  Component Value Date   COLORU Yellow 06/03/2023   CLARITYU Clear 06/03/2023   GLUCOSEUR Negative 06/03/2023   BILIRUBINUR Negative 06/03/2023   KETONESU Negative 06/03/2023   SPECGRAV 1.015 06/03/2023   RBCUR Negative 06/03/2023   PHUR 5.5 06/03/2023   PROTEINUR Negative 06/03/2023   UROBILINOGEN 0.2 06/03/2023   LEUKOCYTESUR Negative 06/03/2023    Lab Results  Component Value Date   CREATININE 1.59 (H) 05/21/2023   CREATININE 2.00 (H) 05/20/2023   CREATININE 1.89 (H) 05/20/2023    Lab Results  Component Value Date   HGBA1C 6.4 (H) 08/06/2022    Lab Results  Component Value Date   HGB 11.0 (L) 05/20/2023     ASSESSMENT AND PLAN Ms. Keel is a  78 y.o. with:  1. Mixed stress and urge urinary incontinence   2. Nocturia   3. History of CVA, 1998    4. Vaginal atrophy     Mixed stress and urge urinary incontinence Assessment & Plan: - POCT catheterized UA negative, PVR 40mL - history of CVA,  - CKD with Cr 1.59 on 05/21/23 with recent ED visit for AKI and hyperkalemia - cannot use  anti-cholinergics due to age  - We discussed the symptoms of overactive bladder (OAB), which include urinary urgency, urinary frequency, nocturia, with or without urge incontinence.  While we do not know the exact etiology of OAB, several treatment options exist. We discussed management including behavioral therapy (decreasing bladder irritants, urge suppression strategies, timed voids, bladder retraining), physical therapy, medication; for refractory cases posterior tibial nerve stimulation, sacral neuromodulation, and intravesical botulinum toxin injection.  For anticholinergic medications, we discussed the potential side effects of anticholinergics including dry eyes, dry mouth, constipation, cognitive impairment and urinary retention. For Beta-3 agonist medication, we discussed the potential side effect of elevated blood pressure which is more likely to occur in individuals with uncontrolled hypertension. - GFR > 30, Rx and samples provided for Gemtesa - urodynamics for history of CVA, T2DM with peripheral neuropathy, spinal stenosis with leg pain. Encouraged to optimize glycemic control - behavioral modification and bladder training - encouraged Kegel exercises - For treatment of stress urinary incontinence,  non-surgical options include expectant management, weight loss, physical therapy, as well as a pessary.  Surgical options include a midurethral sling, Burch urethropexy, and transurethral injection of a bulking agent. - start vaginal estrogen  Orders: -     Estradiol; Place 0.5 g vaginally 2 (two) times a week. Place 0.5g nightly for two weeks then twice a week after  Dispense: 30 g; Refill: 3  Nocturia Assessment & Plan: - avoid fluid intake after 6pm - elevated your feet during the day or use compression socks to reduce lower extremity swelling - switch your diuretic (e.g. furosemide) dosing to 2pm - reports snoring, consider evaluation for sleep apnea - trial of gemtesa to reassess  symptoms   Orders: -     POCT urinalysis dipstick -     Estradiol; Place 0.5 g vaginally 2 (two) times a week. Place 0.5g nightly for two weeks then twice a week after  Dispense: 30 g; Refill: 3  History of CVA, 1998  Assessment & Plan: - discussed risk of neurogenic bladder due to CVA, T2DM with peripheral neuropathy - recommended urodynamic testing, scheduled today - pt to return with full bladder   Vaginal atrophy Assessment & Plan: - For symptomatic vaginal atrophy options include lubrication with a water-based lubricant, personal hygiene measures and barrier protection against wetness, and estrogen replacement in the form of vaginal cream, vaginal tablets, or a time-released vaginal ring.   - Rx to start vaginal estrogen  Orders: -     Estradiol; Place 0.5 g vaginally 2 (two) times a week. Place 0.5g nightly for two weeks then twice a week after  Dispense: 30 g; Refill: 3  Other orders -     Gemtesa; Take 1 tablet (75 mg total) by mouth daily for 28 days. Leslye Peer; Take 1 tablet (75 mg total) by mouth daily.  Dispense: 30 tablet; Refill: 2  Time spent: I spent 64 minutes dedicated to the care of this patient on the date of this encounter to include pre-visit review of records, face-to-face time with the patient discussing mixed  urinary incontinence, history of CVA, vaginal atrophy, nocturia, and post visit documentation and ordering medication/ testing.    Loleta Chance, MD

## 2023-06-04 DIAGNOSIS — N952 Postmenopausal atrophic vaginitis: Secondary | ICD-10-CM | POA: Insufficient documentation

## 2023-06-04 NOTE — Assessment & Plan Note (Addendum)
-   discussed risk of neurogenic bladder due to CVA, T2DM with peripheral neuropathy - recommended urodynamic testing, scheduled today - pt to return with full bladder

## 2023-06-04 NOTE — Assessment & Plan Note (Signed)
-   avoid fluid intake after 6pm - elevated your feet during the day or use compression socks to reduce lower extremity swelling - switch your diuretic (e.g. furosemide) dosing to 2pm - reports snoring, consider evaluation for sleep apnea - trial of gemtesa to reassess symptoms

## 2023-06-04 NOTE — Assessment & Plan Note (Addendum)
-   POCT catheterized UA negative, PVR 40mL - history of CVA,  - CKD with Cr 1.59 on 05/21/23 with recent ED visit for AKI and hyperkalemia - cannot use anti-cholinergics due to age  - We discussed the symptoms of overactive bladder (OAB), which include urinary urgency, urinary frequency, nocturia, with or without urge incontinence.  While we do not know the exact etiology of OAB, several treatment options exist. We discussed management including behavioral therapy (decreasing bladder irritants, urge suppression strategies, timed voids, bladder retraining), physical therapy, medication; for refractory cases posterior tibial nerve stimulation, sacral neuromodulation, and intravesical botulinum toxin injection.  For anticholinergic medications, we discussed the potential side effects of anticholinergics including dry eyes, dry mouth, constipation, cognitive impairment and urinary retention. For Beta-3 agonist medication, we discussed the potential side effect of elevated blood pressure which is more likely to occur in individuals with uncontrolled hypertension. - GFR > 30, Rx and samples provided for Gemtesa - urodynamics for history of CVA, T2DM with peripheral neuropathy, spinal stenosis with leg pain. Encouraged to optimize glycemic control - behavioral modification and bladder training - encouraged Kegel exercises - For treatment of stress urinary incontinence,  non-surgical options include expectant management, weight loss, physical therapy, as well as a pessary.  Surgical options include a midurethral sling, Burch urethropexy, and transurethral injection of a bulking agent. - start vaginal estrogen

## 2023-06-04 NOTE — Assessment & Plan Note (Signed)
-   For symptomatic vaginal atrophy options include lubrication with a water-based lubricant, personal hygiene measures and barrier protection against wetness, and estrogen replacement in the form of vaginal cream, vaginal tablets, or a time-released vaginal ring.   - Rx to start vaginal estrogen

## 2023-06-10 ENCOUNTER — Encounter: Payer: Self-pay | Admitting: Cardiovascular Disease

## 2023-06-10 ENCOUNTER — Ambulatory Visit: Payer: Medicare PPO | Attending: Cardiovascular Disease | Admitting: Cardiovascular Disease

## 2023-06-10 DIAGNOSIS — D6859 Other primary thrombophilia: Secondary | ICD-10-CM | POA: Diagnosis not present

## 2023-06-10 DIAGNOSIS — Z8673 Personal history of transient ischemic attack (TIA), and cerebral infarction without residual deficits: Secondary | ICD-10-CM

## 2023-06-10 DIAGNOSIS — I2510 Atherosclerotic heart disease of native coronary artery without angina pectoris: Secondary | ICD-10-CM | POA: Diagnosis not present

## 2023-06-10 DIAGNOSIS — I10 Essential (primary) hypertension: Secondary | ICD-10-CM

## 2023-06-10 DIAGNOSIS — E119 Type 2 diabetes mellitus without complications: Secondary | ICD-10-CM

## 2023-06-10 DIAGNOSIS — E785 Hyperlipidemia, unspecified: Secondary | ICD-10-CM

## 2023-06-10 DIAGNOSIS — I480 Paroxysmal atrial fibrillation: Secondary | ICD-10-CM | POA: Diagnosis not present

## 2023-06-10 DIAGNOSIS — Z9861 Coronary angioplasty status: Secondary | ICD-10-CM

## 2023-06-10 DIAGNOSIS — N1832 Chronic kidney disease, stage 3b: Secondary | ICD-10-CM

## 2023-06-10 LAB — CBC

## 2023-06-10 MED ORDER — EZETIMIBE 10 MG PO TABS: 10.0000 mg | ORAL_TABLET | Freq: Every day | ORAL | 3 refills | Status: DC

## 2023-06-10 MED ORDER — ROSUVASTATIN CALCIUM 20 MG PO TABS: 20.0000 mg | ORAL_TABLET | Freq: Every day | ORAL | 3 refills | Status: DC

## 2023-06-10 NOTE — Progress Notes (Unsigned)
 Patient ID: Debbie [name], female   DOB: [birth year], 78 y.o.   MRN: [id number]     PCP: Dr. [facility]  HPI: Debbie [name], is a 78 y.o. female who presents to the office today for an 12 month followup evaluation.  Debbie [name] has known CAD and in 2001 suffered an acute coronary syndrome and was found to have total occlusion of her RCA. She had 3 stents placed to her RCA. Subsequent catheterization in 2006 showed all 3 stents widely patent. Additionally, she has a history of hypertension as well as hyperlipidemia. She also has a history of atrial tachycardia. A nuclear perfusion study in September 2013 showed minimal anterior thinning not felt to be significant.  Because of mild possible claudication symptoms her lower extremity, she underwent a lower extremity arterial Doppler study which suggested mild arterial insufficiency with ABIs of 0.83 bilaterally.  When I  saw her in September 2014 laboratory suggested that she had evolved into type 2 diabetes mellitus. At that time her hemoglobin A1c was 7.0 and her fasting blood sugar was 128. She had normal renal function. She subsequently saw Debbie [name] who did not initiate medical therapy but to pursue aggressive dietary adjustments initially.   She was evaluated in September 2017 by Debbie [name] after experiencing left-sided chest pressure which was not exertional and oftentimes occurring at the night.  It did not radiate to her arms, jaw, or back.  She denied associated nausea, vomiting or diaphoresis.  She was referred for nuclear perfusion study which was done on 01/19/2016.  This was a normal study.  Ejection fraction was 58%.  She underwent blood work time and on her dose of Lipitor 80 mg and Zetia 10 mg total cholesterol was 150, triglycerides 110, HDL 64, and LDL 64. There was very minimal renal insufficiency with a creatinine of 1.14.  Her fasting glucose was 150.    I saw her in November 2018 at which time she was remaining  stable and denied chest pain PND orthopnea presyncope or syncope.   I saw her on March 26, 2018 at which time she was continuing to do well and new to be on combination amlodipine/atorvastatin 10/80 in addition to Zetia 10 mg, HCTZ 25 mg, Toprol-XL 50 mg, and ramipril 10 mg daily for hypertension as well as hyperlipidemia.  At that time there was no chest pain or shortness of breath.   I evaluated her on April 08, 2019.  Over the prior year her blood pressure remained fairly stable and she continued to be without recurrent anginal symptomatology.  She started to notice swelling of her ankles bilaterally despite taking HCTZ 25 mg daily.  She continues to be on Caduet 10/80, HCTZ 25 mg daily, and ramipril 10 mg.  She is on Zetia 10 mg for hyperlipidemia.  There was no chest pain PND orthopnea, presyncope or syncope and was unaware of palpitations.  I discussed the importance of increased weight loss and exercise.  When I saw her on March 13, 2021 she denied any chestain.  She sees Debbie [name] at [facility].  She is scheduled to undergo MRI of her lumbar spine by Debbie [name].  She has been evaluated by Debbie [name] for leg discomfort.  She had undergone a robotic assisted laparoscopic hysterectomy with bilateral salpingo-oophorectomy in December 2021 for an ovarian mass.  Presently, she denies chest pain or shortness of breath.  She had some blood pressure lability.  During that evaluation, I suggested  changing her from ACE inhibition to ARB therapy and discontinued ramipril and started olmesartan 20 mg daily.  I recommended she monitor her blood pressure closely with potential further titration to 40 mg.  Since her prior visit she has undergone several additional evaluations and admissions.  On July 02, 2021 she underwent posterior lumbar interbody fusion at L3-4, L4-5.  On March 27 through July 10, 2021 she was admitted with HFp EF exacerbation felt secondary to fluid received during surgery.  An  echocardiogram revealed preserved LV function.  On July 25, 2021 she presented to the ER with atrial fibrillation and was started on Eliquis 5 mg twice a day and Toprol was increased to 100 mg.  Magnesium and potassium were repleted.  On April 16 through August 11, 2021 she presented with chest pain that resolved with nitroglycerin and underwent repeat cardiac catheterization on July 31, 2021 which showed 80% proximal RCA stenosis between the previously placed proximal and mid stent with in-stent restenosis in the mid stent and she had a patent distal third RCA stent.  She received a new 2.5 x 18 mm Onyx Frontier stent in the RCA.  It was recommended she continue triple therapy for 1 month with aspirin/Plavix/Eliquis and then discontinue aspirin.  On April 05 August 2021 she presented with leg pain and weakness and she was felt to have true radiculopathy and was discharged with a back brace and Lyrica with follow-up neurosurgery evaluation.  On September 14, 2021 she had a mechanical fall and hit her head and was evaluated in the ER.  Head CT was unremarkable.  She was evaluated by Debbie Shields, NP in the office on September 17, 2021 and felt improved.  She was using her walker at home.  She denied chest pain shortness of breath dizziness or palpitations.  Stamina was gradually improving.  I saw her on January 31, 2022. She was experiencing low back discomfort.  She denies any chest pain.  She states her blood pressure at home has been in the 140 systolic.  She continues to be on amlodipine 10 mg, furosemide 20 mg, metoprolol succinate 100 mg daily and olmesartan 20 mg for hypertension.  She is on Eliquis and Plavix following her stent.  She continues to be on rosuvastatin at 5 mg for hyperlipidemia.  She is on Lyrica for her neuropathy and takes Myrbetriq for bladder issues.  During that evaluation, her blood pressure was elevated and ECG showed atrial bigeminy with ventricular rate in the 50s.  I recommended slight  titration of olmesartan from 20 mg up to 30 mg for 2 weeks and if her systolic blood pressure remain greater than 140 she was advised to increase to 40 mg.  I last saw her on February 04, 2023.  One month prior to that evaluation she had COVID and pneumonia and required antibiotics.  She has not had recurrent anginal symptoms.  Her last coronary intervention was in April 2023 at which time she received a new Onyx frontier stent in the RCA between the previously placed proximal and mid stent with in-stent restenosis in the mid stent and had a patent distal third stent.  She was advised to take triple therapy for 1 month and since that time has continued to be on Plavix and Eliquis.    Since I last saw her,, she was evaluated by Debbie Contras, NP for March 28, 2023 for ER follow-up after she had presented to emergency room for left-sided chest pain on December 3.  During that evaluation, she was started on Imdur 30 mg.  She was evaluated in the emergency room in February 4,2025 with hyperkalemia and AKI. She was treated with IV fluids with improvement in creatinine from 2.0 down to 1.59.    Presently, she feels well.  She denies any chest pain or significant shortness of breath.  She continues to be on amlodipine 10 mg, furosemide 20 mg, isosorbide 30 mg, metoprolol succinate 100 mg and has still been taking olmesartan 40 mg.  She is on rosuvastatin at only 15 mg and apparently has not been taking Zetia   She continues to be on Eliquis and low-dose aspirin.   Past Medical History:  Diagnosis Date   Arthritis    CAD (coronary artery disease) 11/1999   RCA stent X3, patent '06. Nuc low risk 9/13   Cataract    beginning stages   Diabetes mellitus without complication (HCC)    Dysrhythmia    History of stress test 01/08/2012   Showed minimal anterior thinning not felt to be significant.   Hx of echocardiogram 12/12/2011   EF>55%   Hypercholesteremia    Hypertension    Myocardial infarction [facility])     18 years ago    age 7   PAF (paroxysmal atrial fibrillation) (HCC) 11/2011   SSS component with some bradycardia   Stroke (HCC) 1998   Vitamin D deficiency     Past Surgical History:  Procedure Laterality Date   ABDOMINAL HYSTERECTOMY  2021   CATARACT EXTRACTION, BILATERAL     CHOLECYSTECTOMY     COLONOSCOPY     CORONARY ANGIOGRAM  08/2004   patent stents   CORONARY ANGIOPLASTY WITH STENT PLACEMENT  11/1999   X 3   CORONARY STENT INTERVENTION N/A 07/31/2021   Procedure: CORONARY STENT INTERVENTION;  Surgeon: Lennette Bihari, MD;  Location: [facility] INVASIVE CV LAB;  Service: Cardiovascular;  Laterality: N/A;   EYE SURGERY     LEFT HEART CATH AND CORONARY ANGIOGRAPHY N/A 07/31/2021   Procedure: LEFT HEART CATH AND CORONARY ANGIOGRAPHY;  Surgeon: Lennette Bihari, MD;  Location: [facility] INVASIVE CV LAB;  Service: Cardiovascular;  Laterality: N/A;   MULTIPLE TOOTH EXTRACTIONS     ROBOTIC ASSISTED TOTAL HYSTERECTOMY WITH BILATERAL SALPINGO OOPHERECTOMY Bilateral 03/29/2020   Procedure: XI ROBOTIC ASSISTED TOTAL HYSTERECTOMY WITH BILATERAL SALPINGO OOPHORECTOMY;  Surgeon: Gerald Leitz, MD;  Location: [facility] OR;  Service: Gynecology;  Laterality: Bilateral;  Debbie [name] to RNFA confirmed on 02/16/20 CS    No Known Allergies  Current Outpatient Medications  Medication Sig Dispense Refill   acetaminophen (TYLENOL) 500 MG tablet Take 2 tablets (1,000 mg total) by mouth every 8 (eight) hours as needed. (Patient taking differently: Take 1,000 mg by mouth every 8 (eight) hours as needed for moderate pain (pain score 4-6).) 30 tablet 0   albuterol (VENTOLIN HFA) 108 (90 Base) MCG/ACT inhaler Inhale 2 puffs into the lungs every 6 (six) hours as needed for shortness of breath or wheezing.     apixaban (ELIQUIS) 5 MG TABS tablet Take 1 tablet by mouth twice daily 60 tablet 5   aspirin EC 81 MG tablet Take 1 tablet (81 mg total) by mouth daily. Swallow whole.     clopidogrel (PLAVIX) 75 MG tablet Take 75 mg by mouth  daily.     estradiol (ESTRACE) 0.1 MG/GM vaginal cream Place 0.5 g vaginally 2 (two) times a week. Place 0.5g nightly for two weeks then twice a week after 30 g 3  furosemide (LASIX) 20 MG tablet Take 1 tablet by mouth once daily 90 tablet 0   isosorbide mononitrate (IMDUR) 30 MG 24 hr tablet Take 1 tablet (30 mg total) by mouth daily. 90 tablet 3   meloxicam (MOBIC) 7.5 MG tablet Take 7.5 mg by mouth 2 (two) times daily as needed for pain.     metFORMIN (GLUCOPHAGE) 500 MG tablet Take 1 tablet (500 mg total) by mouth 2 (two) times daily. (Patient taking differently: Take 1,000 mg by mouth 2 (two) times daily with a meal.)     metoprolol succinate (TOPROL-XL) 100 MG 24 hr tablet TAKE 1 TABLET BY MOUTH ONCE DAILY WITH FOOD OR IMMEDIATELY FOLLOWING MEAL 90 tablet 2   Multiple Vitamin (MULTIVITAMIN) capsule Take 1 capsule by mouth daily.     nitroGLYCERIN (NITROSTAT) 0.4 MG SL tablet Place 1 tablet (0.4 mg total) under the tongue every 5 (five) minutes as needed for chest pain. 25 tablet 3   olmesartan (BENICAR) 40 MG tablet Take 1 tablet (40 mg total) by mouth daily. 90 tablet 1   potassium chloride SA (KLOR-CON M) 20 MEQ tablet Take 1 tablet (20 mEq total) by mouth daily. 90 tablet 3   rosuvastatin (CRESTOR) 20 MG tablet Take 1 tablet (20 mg total) by mouth daily. 90 tablet 3   rosuvastatin (CRESTOR) 5 MG tablet 3 tablet Orally Once a day     Vibegron (GEMTESA) 75 MG TABS Take 1 tablet (75 mg total) by mouth daily for 28 days.     amLODipine (NORVASC) 10 MG tablet Take 1 tablet (10 mg total) by mouth daily. 90 tablet 3   ezetimibe (ZETIA) 10 MG tablet Take 1 tablet (10 mg total) by mouth daily. 90 tablet 3   sulfamethoxazole-trimethoprim (BACTRIM DS) 800-160 MG tablet Take 1 tablet by mouth 2 (two) times daily for 3 days. 6 tablet 0   No current facility-administered medications for this visit.    Socially she denies tobacco or alcohol. She does not routinely exercise.   ROS General:  Negative; No fevers, chills, or night sweats;  HEENT: Negative; No changes in vision or hearing, sinus congestion, difficulty swallowing Pulmonary: Negative; No cough, wheezing, shortness of breath, hemoptysis Cardiovascular: Negative; No chest pain, presyncope, syncope, palpitations GI: Negative; No nausea, vomiting, diarrhea, or abdominal pain GU: Negative; No dysuria, hematuria, or difficulty voiding Musculoskeletal: Negative; no myalgias, joint pain, or weakness Hematologic/Oncology: Negative; no easy bruising, bleeding Endocrine: Negative; no heat/cold intolerance; no diabetes Neuro: Negative; no changes in balance, headaches Skin: Negative; No rashes or skin lesions Psychiatric: Negative; No behavioral problems, depression Sleep: Negative; No snoring, daytime sleepiness, hypersomnolence, bruxism, restless legs, hypnogognic hallucinations, no cataplexy Other comprehensive 14 point system review is negative.   PE BP (!) 152/64 (BP Location: Left Arm, Patient Position: Sitting, Cuff Size: Normal)   Pulse 62   Ht 5' 2.6" (1.59 m)   Wt 177 lb (80.3 kg)   SpO2 93%   BMI 31.76 kg/m    Repeat blood pressure by me was 130/72  Wt Readings from Last 3 Encounters:  06/10/23 177 lb (80.3 kg)  06/03/23 174 lb (78.9 kg)  05/20/23 172 lb (78 kg)   General: Alert, oriented, no distress.  Skin: normal turgor, no rashes, warm and dry HEENT: Normocephalic, atraumatic. Pupils equal round and reactive to light; sclera anicteric; extraocular muscles intact; Nose without nasal septal hypertrophy Mouth/Parynx benign; Mallinpatti scale 3 Neck: No JVD, no carotid bruits; normal carotid upstroke Lungs: clear to ausculatation and percussion;  no wheezing or rales Chest wall: without tenderness to palpitation Heart: PMI not displaced, RRR, s1 s2 normal, 1/6 systolic murmur, no diastolic murmur, no rubs, gallops, thrills, or heaves Abdomen: soft, nontender; no hepatosplenomehaly, BS+; abdominal aorta  nontender and not dilated by palpation. Back: no CVA tenderness Pulses 2+ Musculoskeletal: full range of motion, normal strength, no joint deformities Extremities: no clubbing cyanosis or edema, Homan's sign negative  Neurologic: grossly nonfocal; Cranial nerves grossly wnl Psychologic: Normal mood and affect      EKG Interpretation Date/Time:  Tuesday June 10 2023 08:17:28 EST Ventricular Rate:  62 PR Interval:  150 QRS Duration:  94 QT Interval:  386 QTC Calculation: 391 R Axis:   18  Text Interpretation: Sinus rhythm with Premature atrial complexes Inferior infarct (cited on or before 20-May-2023) Cannot rule out Anterior infarct (cited on or before 20-May-2023) When compared with ECG of 20-May-2023 23:03, No significant change was found Confirmed by Debbie [name] ([id number]) on 06/12/2023 9:01:09 AM    January 31, 2022 ECG (independently read by me): Sinus bradycardia at 55, PACs with atrial bigeminy  March 13, 2021 ECG (independently read by me):  Sinus bradycardia at 57; LVH, old IMI  December 2020 ECG (independently read by me): Sinus bradycardia 58 bpm with an isolated PVC.  Nonspecific T wave abnormality inferolaterally.  December 2019 ECG (independently read by me): Sinus bradycardia 59 bpm.  PAC.  November 2018 ECG (independently read by me): Sinus bradycardia 53 with sinus arrhythmia.  Small inferior Q waves.  November 2017 ECG (independently read by me): Sinus bradycardia at 53 with mild sinus arrhythmia.  Nonspecific T changes.  September 2016 ECG (independently read by me): Sinus bradycardia 55 bpm.  No significant ST-T changes.  March 2015 ECG (independentlyby me): Sinus bradycardia 55 beats per minute. No ectopy.  Prior September,16 2014 ECG: Sinus bradycardia 48 beats per minute. Intervals normal  LABS:    Latest Ref Rng & Units 06/10/2023    9:54 AM 05/21/2023    1:20 PM 05/20/2023   10:39 PM  BMP  Glucose 70 - 99 mg/dL 604  89  540   BUN 8 - 27 mg/dL  25  35  44   Creatinine 0.57 - 1.00 mg/dL 9.81  1.91  4.78   BUN/Creat Ratio 12 - 28 16     Sodium 134 - 144 mmol/L 140  132  137   Potassium 3.5 - 5.2 mmol/L 5.2  4.9  4.5   Chloride 96 - 106 mmol/L 108  107  108   CO2 20 - 29 mmol/L 18  17  20    Calcium 8.7 - 10.3 mg/dL 29.5  9.7  62.1       Latest Ref Rng & Units 06/10/2023    9:54 AM 05/20/2023   10:39 PM 05/20/2023    8:55 AM  Hepatic Function  Total Protein 6.0 - 8.5 g/dL 7.0  6.7  6.7   Albumin 3.8 - 4.8 g/dL 4.5  3.7  4.3   AST 0 - 40 IU/L 17  17  13    ALT 0 - 32 IU/L 14  15  12    Alk Phosphatase 44 - 121 IU/L 87  63  86   Total Bilirubin 0.0 - 1.2 mg/dL 0.3  0.5  0.3       Latest Ref Rng & Units 06/10/2023    9:54 AM 05/20/2023   10:39 PM 03/18/2023   10:14 AM  CBC  WBC 3.4 - 10.8 x10E3/uL 9.3  9.3  8.4   Hemoglobin 11.1 - 15.9 g/dL 16.1  09.6  04.5   Hematocrit 34.0 - 46.6 % 36.5  33.6  34.7   Platelets 150 - 450 x10E3/uL 382  292  275    Lab Results  Component Value Date   MCV 91 06/10/2023   MCV 89.4 05/20/2023   MCV 88.1 03/18/2023   Lab Results  Component Value Date   TSH 3.310 08/06/2022   Lab Results  Component Value Date   HGBA1C 6.4 (H) 08/06/2022   Lipid Panel     Component Value Date/Time   CHOL 251 (H) 05/20/2023 0855   TRIG 125 05/20/2023 0855   HDL 82 05/20/2023 0855   CHOLHDL 3.1 05/20/2023 0855   CHOLHDL 2.5 07/30/2021 0508   VLDL 19 07/30/2021 0508   LDLCALC 147 (H) 05/20/2023 0855   RADIOLOGY: No results found.  IMPRESSION:  1. Essential hypertension   2. CAD S/P percutaneous coronary angioplasty   3. PAF (paroxysmal atrial fibrillation) (HCC)   4. Hypercoagulable state (HCC)   5. Hyperlipidemia LDL goal < 50   6. Stage 3b chronic kidney disease (HCC)   7. Type 2 diabetes mellitus without complication, without long-term current use of insulin (HCC)   8. History of CVA, 1998      ASSESSMENT AND PLAN: Ms. Leeth is a 78 year old female who is 23 years (2001) following her  initial multi lesion intervention to her RCA in the setting of an ACS at which time she was found to have total RCA occlusion and had successful insertion of 3 stents.  On follow-up catheterization in 2006 all stents were widely patent.  Over the past 2 decades she has been aggressively treated with lipid management and has been on atorvastatin 80 mg in addition to Zetia 10 mg.  Lipid studies in December 2019 showed a total cholesterol 154, HDL 62 LDL 71 triglycerides 103.  When evaluated in 2020, she was having some mild edema despite taking HCTZ and at that time was given a prescription for furosemide.  At her office visit with me in November 2022 due to blood pressure elevation she was changed to ARB therapy from ACE inhibition.  Since her evaluation, she has had multiple physician evaluations and hospital evaluations which are outlined in my HPI.  She had developed atrial fibrillation in April 2023 and also developed recurrent anginal symptomatology and underwent repeat catheterization by me on July 31, 2021.  She had normal LAD and left circumflex coronary arteries.  The RCA had a previously placed proximal stent but now had 80% stenosis between the proximal and mid stent with 50% narrowing within the mid stented segment and she had a patent distal third stent.  She underwent successful PCI of the 80% stenosis and PTCA of the mid in-stent renarrowing.  She was treated with triple drug therapy with aspirin/clopidogrel and Eliquis for 1 month and then was advised to discontinue aspirin.  On her more recent evaluation, her blood pressure remained elevated and olmesartan was further titrated.  When seen by me in October 2023 her blood pressure was stable at 134/64 on amlodipine 10 mg, furosemide 20 mg, metoprolol succinate 100 mg and olmesartan 40 mg.  She has continued to be on Zetia and rosuvastatin 10 mg for hyperlipidemia.  LDL in April 2023 was excellent at 18, but more recent laboratory April 2024 was  elevated at 139.  At that time, she had seen Laural Golden and had been off rosuvastatin for several months.  Rosuvastatin was resumed at 10 mg.  When I last saw her in October 2024, she was 18 months since her last intervention I suggested discontinuance of Plavix with resumption of 81 mg aspirin to take with her Eliquis.  Apparently, she has undergone 2 brief ER evaluations with most recent on May 20, 2023 1 laboratory had shown potassium 5.8 and creatinine increased to 1.89.  She was treated with IV fluids.  Subsequent laboratory same day showed potassium 4.5 with creatinine 2.0.  She has not had repeat laboratory and I am recommending she undergo comprehensive metabolic panel, CBC, and LP(a) today.  With her lipid panel 3 weeks ago showing LDL increased to 147 she was advised to increase rosuvastatin to 20 mg and resume Zetia 10 mg.  If creatinine is elevated today olmesartan may need to be discontinued.  If LP(a) is elevated, target LDL is less than 50 and she may benefit from the addition of PCSK9 inhibition to her rosuvastatin and Zetia.  I am recommending follow-up laboratory be repeated in 3 months or sooner after today depending upon results.  She is aware of my retirement in several months.  Her husband sees Dr. Swaziland.  I will transition her to the care of Dr. Swaziland with follow-up in 4 to 6 months.    Lennette Bihari, MD, [facility]  06/12/2023 9:23 AM

## 2023-06-10 NOTE — Patient Instructions (Signed)
 Medication Instructions:  Begin Rosuvastatin 20mg  daily.  Resume the Zetia 10mg  daily   *If you need a refill on your cardiac medications before your next appointment, please call your pharmacy*   Lab Work: Labs will be drawn today. ALSO, you will return for fasting labs in 3 months. If you have labs (blood work) drawn today and your tests are completely normal, you will receive your results only by: MyChart Message (if you have MyChart) OR A paper copy in the mail If you have any lab test that is abnormal or we need to change your treatment, we will call you to review the results.   Testing/Procedures: No procedures ordered today.    Follow-Up: At [facility], you and your health needs are our priority.  As part of our continuing mission to provide you with exceptional heart care, we have created designated Provider Care Teams.  These Care Teams include your primary Cardiologist (physician) and Advanced Practice Providers (APPs -  Physician Assistants and Nurse Practitioners) who all work together to provide you with the care you need, when you need it.  We recommend signing up for the patient portal called "MyChart".  Sign up information is provided on this After Visit Summary.  MyChart is used to connect with patients for Virtual Visits (Telemedicine).  Patients are able to view lab/test results, encounter notes, upcoming appointments, etc.  Non-urgent messages can be sent to your provider as well.   To learn more about what you can do with MyChart, go to [email or web address].    Your next appointment:   5-6 month(s)  Provider:   Peter Swaziland, MD     Other Instructions        1st Floor: - Lobby - Registration  - Pharmacy  - Lab - Cafe   2nd Floor: - PV Lab - Diagnostic Testing (echo, CT, nuclear med)   3rd Floor: - Vacant   4th Floor: - TCTS (cardiothoracic surgery) - AFib Clinic - Structural Heart Clinic - Vascular Surgery  - Vascular  Ultrasound   5th Floor: - [facility] (general and EP) - Clinical Pharmacy for coumadin, hypertension, lipid, weight-loss medications, and med management appointments      Valet parking services will be available as well.

## 2023-06-10 NOTE — Progress Notes (Unsigned)
 Marland Kitchen

## 2023-06-11 ENCOUNTER — Ambulatory Visit (INDEPENDENT_AMBULATORY_CARE_PROVIDER_SITE_OTHER): Payer: Medicare PPO | Admitting: Obstetrics and Gynecology

## 2023-06-11 ENCOUNTER — Encounter: Payer: Self-pay | Admitting: Obstetrics and Gynecology

## 2023-06-11 ENCOUNTER — Other Ambulatory Visit (HOSPITAL_COMMUNITY)
Admission: RE | Admit: 2023-06-11 | Discharge: 2023-06-11 | Disposition: A | Discharge status: Home / Self Care | Payer: Medicare PPO | Source: Other Acute Inpatient Hospital | Attending: Obstetrics and Gynecology | Admitting: Obstetrics and Gynecology

## 2023-06-11 VITALS — BP 151/82 | HR 69

## 2023-06-11 DIAGNOSIS — R319 Hematuria, unspecified: Secondary | ICD-10-CM

## 2023-06-11 DIAGNOSIS — R82998 Other abnormal findings in urine: Secondary | ICD-10-CM | POA: Insufficient documentation

## 2023-06-11 DIAGNOSIS — N390 Urinary tract infection, site not specified: Secondary | ICD-10-CM

## 2023-06-11 DIAGNOSIS — R350 Frequency of micturition: Secondary | ICD-10-CM

## 2023-06-11 LAB — URINALYSIS, ROUTINE W REFLEX MICROSCOPIC
Bilirubin Urine: NEGATIVE
Glucose, UA: NEGATIVE mg/dL
Hgb urine dipstick: NEGATIVE
Ketones, ur: NEGATIVE mg/dL
Nitrite: POSITIVE — AB
Protein, ur: NEGATIVE mg/dL
Specific Gravity, Urine: 1.015 (ref 1.005–1.030)
pH: 5 (ref 5.0–8.0)

## 2023-06-11 LAB — COMPREHENSIVE METABOLIC PANEL
ALT: 14 IU/L (ref 0–32)
AST: 17 IU/L (ref 0–40)
Albumin: 4.5 g/dL (ref 3.8–4.8)
Alkaline Phosphatase: 87 IU/L (ref 44–121)
BUN/Creatinine Ratio: 16 (ref 12–28)
BUN: 25 mg/dL (ref 8–27)
Bilirubin Total: 0.3 mg/dL (ref 0.0–1.2)
CO2: 18 mmol/L — ABNORMAL LOW (ref 20–29)
Calcium: 10.5 mg/dL — ABNORMAL HIGH (ref 8.7–10.3)
Chloride: 108 mmol/L — ABNORMAL HIGH (ref 96–106)
Creatinine, Ser: 1.52 mg/dL — ABNORMAL HIGH (ref 0.57–1.00)
Globulin, Total: 2.5 g/dL (ref 1.5–4.5)
Glucose: 104 mg/dL — ABNORMAL HIGH (ref 70–99)
Potassium: 5.2 mmol/L (ref 3.5–5.2)
Sodium: 140 mmol/L (ref 134–144)
Total Protein: 7 g/dL (ref 6.0–8.5)
eGFR: 35 mL/min/{1.73_m2} — ABNORMAL LOW (ref >59)

## 2023-06-11 LAB — CBC
Hematocrit: 36.5 % (ref 34.0–46.6)
Hemoglobin: 12.2 g/dL (ref 11.1–15.9)
MCH: 30.4 pg (ref 26.6–33.0)
MCHC: 33.4 g/dL (ref 31.5–35.7)
MCV: 91 fL (ref 79–97)
Platelets: 382 10*3/uL (ref 150–450)
RBC: 4.01 x10E6/uL (ref 3.77–5.28)
RDW: 14.6 % (ref 11.7–15.4)
WBC: 9.3 10*3/uL (ref 3.4–10.8)

## 2023-06-11 LAB — POCT URINALYSIS DIPSTICK
Bilirubin, UA: NEGATIVE
Blood, UA: POSITIVE
Glucose, UA: NEGATIVE
Ketones, UA: NEGATIVE
Nitrite, UA: POSITIVE
Protein, UA: NEGATIVE
Spec Grav, UA: 1.02 (ref 1.010–1.025)
Urobilinogen, UA: 0.2 U/dL
pH, UA: 5.5 (ref 5.0–8.0)

## 2023-06-11 LAB — LIPOPROTEIN A (LPA): Lipoprotein (a): 490 nmol/L — ABNORMAL HIGH (ref <75.0)

## 2023-06-11 MED ORDER — SULFAMETHOXAZOLE-TRIMETHOPRIM 800-160 MG PO TABS: 1.0000 | ORAL_TABLET | Freq: Two times a day (BID) | ORAL | 0 refills | Status: AC

## 2023-06-11 NOTE — Progress Notes (Signed)
 Patient initially presented for Urodynamics. Patient had a positive findings for UTI on sample. Patient to be rescheduled.

## 2023-06-12 ENCOUNTER — Encounter: Payer: Self-pay | Admitting: Cardiovascular Disease

## 2023-06-14 LAB — URINE CULTURE: Culture: 80000 — AB

## 2023-06-16 ENCOUNTER — Ambulatory Visit: Payer: Medicare PPO | Admitting: Obstetrics

## 2023-06-17 ENCOUNTER — Encounter: Payer: Self-pay | Admitting: Obstetrics and Gynecology

## 2023-06-17 ENCOUNTER — Encounter: Payer: Medicare PPO | Admitting: Obstetrics and Gynecology

## 2023-06-17 ENCOUNTER — Telehealth: Payer: Self-pay | Admitting: Pharmacist

## 2023-06-17 ENCOUNTER — Ambulatory Visit: Attending: Cardiovascular Disease | Admitting: Pharmacist

## 2023-06-17 ENCOUNTER — Telehealth: Payer: Self-pay | Admitting: Pharmacy Technician

## 2023-06-17 ENCOUNTER — Ambulatory Visit (INDEPENDENT_AMBULATORY_CARE_PROVIDER_SITE_OTHER): Admitting: Obstetrics and Gynecology

## 2023-06-17 ENCOUNTER — Encounter: Payer: Self-pay | Admitting: Pharmacist

## 2023-06-17 ENCOUNTER — Other Ambulatory Visit (HOSPITAL_COMMUNITY): Payer: Self-pay

## 2023-06-17 DIAGNOSIS — I2510 Atherosclerotic heart disease of native coronary artery without angina pectoris: Secondary | ICD-10-CM | POA: Diagnosis not present

## 2023-06-17 DIAGNOSIS — R350 Frequency of micturition: Secondary | ICD-10-CM

## 2023-06-17 DIAGNOSIS — Z9861 Coronary angioplasty status: Secondary | ICD-10-CM | POA: Diagnosis not present

## 2023-06-17 DIAGNOSIS — E1169 Type 2 diabetes mellitus with other specified complication: Secondary | ICD-10-CM

## 2023-06-17 DIAGNOSIS — N3281 Overactive bladder: Secondary | ICD-10-CM | POA: Diagnosis not present

## 2023-06-17 DIAGNOSIS — E785 Hyperlipidemia, unspecified: Secondary | ICD-10-CM

## 2023-06-17 DIAGNOSIS — N3946 Mixed incontinence: Secondary | ICD-10-CM

## 2023-06-17 DIAGNOSIS — E782 Mixed hyperlipidemia: Secondary | ICD-10-CM

## 2023-06-17 LAB — POCT URINALYSIS DIPSTICK
Bilirubin, UA: NEGATIVE
Blood, UA: NEGATIVE
Glucose, UA: NEGATIVE
Ketones, UA: NEGATIVE
Leukocytes, UA: NEGATIVE
Nitrite, UA: NEGATIVE
Protein, UA: NEGATIVE
Spec Grav, UA: 1.01 (ref 1.010–1.025)
Urobilinogen, UA: 0.2 U/dL
pH, UA: 5 (ref 5.0–8.0)

## 2023-06-17 MED ORDER — REPATHA SURECLICK 140 MG/ML ~~LOC~~ SOAJ: 1.0000 mL | SUBCUTANEOUS | 1 refills | Status: DC

## 2023-06-17 MED ORDER — ROSUVASTATIN CALCIUM 20 MG PO TABS: 20.0000 mg | ORAL_TABLET | Freq: Every day | ORAL | 1 refills | Status: DC

## 2023-06-17 NOTE — Telephone Encounter (Signed)
 Pharmacy Patient Advocate Encounter   Received notification from Pt Calls Messages that prior authorization for Repatha is required/requested.   Insurance verification completed.   The patient is insured through [employer] .   Per test claim: PA required; PA submitted to above mentioned insurance via CoverMyMeds Key/confirmation #/EOC [id number] Status is pending

## 2023-06-17 NOTE — Telephone Encounter (Signed)
 Please complete PA for repatha and enroll n [employer]

## 2023-06-17 NOTE — Progress Notes (Signed)
 Acknowledged.

## 2023-06-17 NOTE — Progress Notes (Signed)
 [facility] Urodynamics Procedure  Referring Physician: Salli Real, MD Date of Procedure: 06/17/2023  Debbie [name] is a 78 y.o. female who presents for urodynamic evaluation. Indication(s) for study: mixed incontinence  Vital Signs: There were no vitals taken for this visit.  Laboratory Results: A catheterized urine specimen revealed:  POC urine:  Lab Results  Component Value Date   COLORU yellow 06/17/2023   CLARITYU clear 06/17/2023   GLUCOSEUR Negative 06/17/2023   BILIRUBINUR negative 06/17/2023   KETONESU negative 06/17/2023   SPECGRAV 1.010 06/17/2023   RBCUR negative 06/17/2023   PHUR 5.0 06/17/2023   PROTEINUR Negative 06/17/2023   UROBILINOGEN 0.2 06/17/2023   LEUKOCYTESUR Negative 06/17/2023      Voiding Diary: Not done  Procedure Timeout:  The correct patient was verified and the correct procedure was verified. The patient was in the correct position and safety precautions were reviewed based on at the patient's history.  Urodynamic Procedure A 91F dual lumen urodynamics catheter was placed under sterile conditions into the patient's bladder. A 91F catheter was placed into the rectum in order to measure abdominal pressure. EMG patches were placed in the appropriate position.  All connections were confirmed and calibrations/adjusted made. Saline was instilled into the bladder through the dual lumen catheters.  Cough/valsalva pressures were measured periodically during filling.  Patient was allowed to void.  The bladder was then emptied of its residual.  UROFLOW: Revealed a Qmax of 8.2 mL/sec.  She voided 67 mL and had a residual of 260 mL.  It was a interrupted pattern and represented normal habits though interpretation limited due to low voided volume.  CMG: This was performed with sterile water in the sitting position at a fill rate of 20-30 mL/min.    First sensation of fullness was 182 mLs,  First urge was 189 mLs,  Strong urge was 189 mLs and   Capacity was 336 mLs  Stress incontinence was not demonstrated Highest negative CLPP was 126 cmH20 at 165 ml. Highest negative VLPP was 89 cmH20at 165 ml.   Detrusor function was overactive, with phasic contractions seen.  The first occurred at 35 mL to 6 cm of water and was not associated with urge.  Compliance:  Low . End fill detrusor pressure was 34cmH20.  Calculated compliance was 52mL/cmH20  UPP: MUCP without barrier reduction was 69 cm of water.    MICTURITION STUDY: Voiding was performed without reduction in the sitting position.  Pdet at Qmax was 35.2 cm of water.  Qmax was 16.6 mL/sec.  It was a prolonged normal pattern.  She voided 266 mL and had a residual of 70 mL.  It was a volitional void, sustained detrusor contraction was present and abdominal straining was not present  EMG: This was performed with patches.  She had voluntary contractions, recruitment with fill was present and urethral sphincter was relaxed with void.  The details of the procedure with the study tracings have been scanned into EPIC.   Urodynamic Impression:  1. Sensation was normal; capacity was reduced 2. Stress Incontinence was not demonstrated at normal pressures; 3. Detrusor Overactivity was demonstrated with leakage. 4. Emptying was normal with a normal PVR during micturition study, but elevated PVR during initial void. A sustained detrusor contraction present,  abdominal straining not present, normal urethral sphincter activity on EMG.  Plan: - Patient has been on Gemtesa 75mg  daily. She reports significant decrease in leakage and increased ability to hold her urine and make it to the bathroom.  -  Patient has a follow up with Dr. Olena Leatherwood.

## 2023-06-17 NOTE — Progress Notes (Signed)
 Patient ID: Debbie [name]                 DOB: August 03, 1945                    MRN: [id number]     HPI: Debbie [name] is a 78 y.o. female patient referred to lipid clinic by Dr Tresa Endo. PMH is significant for PAF, HTN, CAD, CHF, T2DM, CKD, CVA, LPA, and urinary incontinence.  Patient presents today to discuss hyperlipidemia treatment. Currently managed on rosuvastatin and ezetimibe. Her rosuvastatin dose is 20mg  however she only has 5mg  tablets so she needs to take 4 at a time. This frustrates her. No adverse effects reported.   Husband is currently on Repatha with grant coverage.   Currently being treated by urogyn for urinary frequency and incontinence. Is on furosemide 20mg  daily. Had procedure this morning.   Current Medications:  Rosuvastatin 20mg  daily Ezetimibe 10mg  daily  Intolerances: N/A  Risk Factors:  CAD T2DM Hx of CVA Elevated LPA CHF  LDL goal: <55  Labs: LPA 490.0, TC 251, Trigs 125, HDL 82, LDL 147 (05/20/23)  Past Medical History:  Diagnosis Date   Arthritis    CAD (coronary artery disease) 11/1999   RCA stent X3, patent '06. Nuc low risk 9/13   Cataract    beginning stages   Diabetes mellitus without complication (HCC)    Dysrhythmia    History of stress test 01/08/2012   Showed minimal anterior thinning not felt to be significant.   Hx of echocardiogram 12/12/2011   EF>55%   Hypercholesteremia    Hypertension    Myocardial infarction [facility])    18 years ago    age 71   PAF (paroxysmal atrial fibrillation) (HCC) 11/2011   SSS component with some bradycardia   Stroke (HCC) 1998   Vitamin D deficiency     Current Outpatient Medications on File Prior to Visit  Medication Sig Dispense Refill   acetaminophen (TYLENOL) 500 MG tablet Take 2 tablets (1,000 mg total) by mouth every 8 (eight) hours as needed. (Patient taking differently: Take 1,000 mg by mouth every 8 (eight) hours as needed for moderate pain (pain score 4-6).) 30 tablet 0   albuterol  (VENTOLIN HFA) 108 (90 Base) MCG/ACT inhaler Inhale 2 puffs into the lungs every 6 (six) hours as needed for shortness of breath or wheezing.     amLODipine (NORVASC) 10 MG tablet Take 1 tablet (10 mg total) by mouth daily. 90 tablet 3   apixaban (ELIQUIS) 5 MG TABS tablet Take 1 tablet by mouth twice daily 60 tablet 5   aspirin EC 81 MG tablet Take 1 tablet (81 mg total) by mouth daily. Swallow whole.     clopidogrel (PLAVIX) 75 MG tablet Take 75 mg by mouth daily.     estradiol (ESTRACE) 0.1 MG/GM vaginal cream Place 0.5 g vaginally 2 (two) times a week. Place 0.5g nightly for two weeks then twice a week after 30 g 3   ezetimibe (ZETIA) 10 MG tablet Take 1 tablet (10 mg total) by mouth daily. 90 tablet 3   furosemide (LASIX) 20 MG tablet Take 1 tablet by mouth once daily 90 tablet 0   isosorbide mononitrate (IMDUR) 30 MG 24 hr tablet Take 1 tablet (30 mg total) by mouth daily. 90 tablet 3   meloxicam (MOBIC) 7.5 MG tablet Take 7.5 mg by mouth 2 (two) times daily as needed for pain.     metFORMIN (GLUCOPHAGE) 500  MG tablet Take 1 tablet (500 mg total) by mouth 2 (two) times daily. (Patient taking differently: Take 1,000 mg by mouth 2 (two) times daily with a meal.)     metoprolol succinate (TOPROL-XL) 100 MG 24 hr tablet TAKE 1 TABLET BY MOUTH ONCE DAILY WITH FOOD OR IMMEDIATELY FOLLOWING MEAL 90 tablet 2   Multiple Vitamin (MULTIVITAMIN) capsule Take 1 capsule by mouth daily.     nitroGLYCERIN (NITROSTAT) 0.4 MG SL tablet Place 1 tablet (0.4 mg total) under the tongue every 5 (five) minutes as needed for chest pain. 25 tablet 3   olmesartan (BENICAR) 40 MG tablet Take 1 tablet (40 mg total) by mouth daily. 90 tablet 1   potassium chloride SA (KLOR-CON M) 20 MEQ tablet Take 1 tablet (20 mEq total) by mouth daily. 90 tablet 3   rosuvastatin (CRESTOR) 20 MG tablet Take 1 tablet (20 mg total) by mouth daily. 90 tablet 3   rosuvastatin (CRESTOR) 5 MG tablet 3 tablet Orally Once a day     Vibegron  (GEMTESA) 75 MG TABS Take 1 tablet (75 mg total) by mouth daily for 28 days.     No current facility-administered medications on file prior to visit.    No Known Allergies  Assessment/Plan:  1. Hyperlipidemia - Patient LDL 147 which is above goal of <55. LPA above goal of <75. Aggressive goal due to history of CVA, T2DM, LPA, and CAD. Recommend addition of PCSK9i.  Using demo pen, educated patent on mechanism of action storage, site selection, administration and possible adverse events. Will complete PA and patient requests grant assistance since her husband also receives this. Recheck lipid panel in 3 months.  Changing rosuvastatin prescription from four 5mg  tablets to one 20mg  tablets.  Advised that she can hold furosemide since she is experiencing no swelling and may be contributing to her urinary incontinence.  Continue rosuvastatin 20mg  daiky Continue ezetimibe 10mg  daily Start Repatha 140mg  q 2 weeks Hold furosemide for now due to urinary incontinence Recheck fasting lipid panel in 2-3 months  Laural Golden, PharmD, BCACP, CDCES, CPP [address] [place], [place], [address] Phone: [phone], Fax: [phone]

## 2023-06-17 NOTE — Addendum Note (Signed)
 Addended by: Cheree Ditto on: 06/17/2023 04:41 PM   Modules accepted: Orders

## 2023-06-17 NOTE — Telephone Encounter (Signed)
 Patient Advocate Encounter   The patient was approved for a [employer] grant that will help cover the cost of repatha Total amount awarded, 2500.00.  Effective: 05/18/23 - 05/16/24   ZOX:[id number] WUJ:[id number] [id number] [id number] [employer] ID: [id number]   Pharmacy provided with approval and processing information. Patient informed via telephone

## 2023-06-17 NOTE — Patient Instructions (Addendum)
 It was seeing you again today  We would like your LDL (bad cholesterol) to be less than 55  I have changed your multiple rosuvastatin tablets to one 20mg  tablet. Please also continue your ezetimibe 10mg  daily  The medication we discussed today is called Repatha which is an injection you would take once every 2 weeks  I will complete the prior authorization and enroll you in the savings program  Once you start the medication we will recheck your fasting lipid panel in about 3 months  You can hold your furosemide for now since there is no swelling. You can take it as needed for future swelling.  Please let us know if you have any questions  Laural Golden, PharmD, BCACP, CDCES, CPP [address] [place], [place], [address] Phone: [phone], Fax: [phone]

## 2023-06-17 NOTE — Telephone Encounter (Signed)
 Pharmacy Patient Advocate Encounter  Received notification from [facility] that Prior Authorization for Repatha has been APPROVED from 04/16/23 to 04/13/24. Ran test claim, Copay is $40.00- one month. This test claim was processed through [facility]- copay amounts may vary at other pharmacies due to pharmacy/plan contracts, or as the patient moves through the different stages of their insurance plan.   PA #/Case ID/Reference #: [id number]

## 2023-06-25 ENCOUNTER — Other Ambulatory Visit (HOSPITAL_COMMUNITY)
Admission: RE | Admit: 2023-06-25 | Discharge: 2023-06-25 | Disposition: A | Discharge status: Home / Self Care | Source: Ambulatory Visit | Attending: Obstetrics | Admitting: Obstetrics

## 2023-06-25 ENCOUNTER — Encounter: Payer: Self-pay | Admitting: Obstetrics

## 2023-06-25 ENCOUNTER — Ambulatory Visit: Admitting: Obstetrics

## 2023-06-25 VITALS — BP 137/77 | HR 68

## 2023-06-25 DIAGNOSIS — N952 Postmenopausal atrophic vaginitis: Secondary | ICD-10-CM

## 2023-06-25 DIAGNOSIS — N3946 Mixed incontinence: Secondary | ICD-10-CM | POA: Diagnosis not present

## 2023-06-25 DIAGNOSIS — Z8673 Personal history of transient ischemic attack (TIA), and cerebral infarction without residual deficits: Secondary | ICD-10-CM

## 2023-06-25 DIAGNOSIS — N1831 Chronic kidney disease, stage 3a: Secondary | ICD-10-CM

## 2023-06-25 DIAGNOSIS — N9089 Other specified noninflammatory disorders of vulva and perineum: Secondary | ICD-10-CM | POA: Insufficient documentation

## 2023-06-25 NOTE — Progress Notes (Addendum)
 [facility] Return Visit  SUBJECTIVE  History of Present Illness: Debbie [name] is a 78 y.o. female seen in follow-up for mixed urinary incontinence, nocturia, history of CVA, and vaginal atrophy. Plan at last visit was Gemtesa, vaginal estrogen.   Reports gemtesa with relief for 1 week that subsided. Prior use of mirabegron with relief, stopped working around 1 year ago.  Cannot use anti-cholinergics due to potassium supplementation Using vaginal estrogen 1g twice a week.  GYN exam by Dr. Richardson Dopp every 2 years  Urodynamic Impression 06/17/23:  Uroflow low Qmax 8.64mL/sec, voided 67mL with PVR Abnormal compliance 55mL/cmH2O 1. Sensation was normal; capacity was reduced 2. Stress Incontinence was not demonstrated at normal pressures; 3. Detrusor Overactivity was demonstrated with leakage. 4. Emptying was normal with a normal PVR during micturition study, but elevated PVR during initial void. A sustained detrusor contraction present,  abdominal straining not present, normal urethral sphincter activity on EMG. Pdet at Qmax 35.2 cm of water.  Qmax was 16.6 mL/sec.  It was a prolonged normal pattern.  She voided 266 mL and had a residual of 70 mL.   Past Medical History: Patient  has a past medical history of Arthritis, CAD (coronary artery disease) (11/1999), Cataract, Diabetes mellitus without complication (HCC), Dysrhythmia, History of stress test (01/08/2012), echocardiogram (12/12/2011), Hypercholesteremia, Hypertension, Myocardial infarction (HCC), PAF (paroxysmal atrial fibrillation) (HCC) (11/2011), Stroke (HCC) (1998), and Vitamin D deficiency.   Past Surgical History: She  has a past surgical history that includes Coronary angioplasty with stent (11/1999); Coronary angiogram (08/2004); Colonoscopy; Cholecystectomy; Multiple tooth extractions; Eye surgery; Cataract extraction, bilateral; Robotic assisted total hysterectomy with bilateral salpingo oophorectomy (Bilateral,  03/29/2020); Abdominal hysterectomy (2021); LEFT HEART CATH AND CORONARY ANGIOGRAPHY (N/A, 07/31/2021); and CORONARY STENT INTERVENTION (N/A, 07/31/2021).   Medications: She has a current medication list which includes the following prescription(s): acetaminophen, albuterol, amlodipine, eliquis, aspirin ec, clopidogrel, estradiol, repatha sureclick, ezetimibe, furosemide, isosorbide mononitrate, meloxicam, metformin, metoprolol succinate, multivitamin, nitroglycerin, olmesartan, potassium chloride sa, and rosuvastatin.   Allergies: Patient has no known allergies.   Social History: Patient  reports that she has been smoking cigarettes. She has never used smokeless tobacco. She reports that she does not drink alcohol and does not use drugs.     OBJECTIVE     Physical Exam: Vitals:   06/25/23 0939  BP: 137/77  Pulse: 68    Physical Exam Constitutional:      General: She is not in acute distress.    Appearance: Normal appearance.  Genitourinary:     No lesions in the vagina.     Genitourinary Comments: Skin separation with bleeding edges after manipulation of vulva for urethral meatus visualization for CIC teaching     Right Labia: skin changes.     Right Labia: No rash, tenderness, lesions or Bartholin's cyst.    Left Labia: skin changes.     Left Labia: No tenderness, lesions, Bartholin's cyst or rash.       Vaginal bleeding and ulceration (at the posterior fourchette after patient was taught CIC) present.     No vaginal discharge, erythema, tenderness or granulation tissue.     Moderate vaginal atrophy present.    Urethral meatus caruncle not present.    No urethral prolapse, tenderness, mass, hypermobility or discharge present.  Cardiovascular:     Rate and Rhythm: Normal rate.  Pulmonary:     Effort: Pulmonary effort is normal. No respiratory distress.  Abdominal:     General: There is no distension.  Neurological:     Mental Status: She is alert.  Vitals reviewed.  Exam conducted with a chaperone present.    CIC teaching: Reviewed anatomy, handwashing, supplies needed for CIC. After verbal consent was obtained from the patient for catheterization prior to bladder botox injection. Patient performed an in and out catheterization with minimal assistance.  The patient tolerated the procedure well. PVR 40mL after voiding .  Lab Results  Component Value Date   CREATININE 1.52 (H) 06/10/2023   CREATININE 1.59 (H) 05/21/2023   CREATININE 2.00 (H) 05/20/2023         No data to display             ASSESSMENT AND PLAN    Debbie [name] is a 78 y.o. with:  1. History of CVA (cerebrovascular accident)   2. Mixed stress and urge urinary incontinence   3. Stage 3a chronic kidney disease (CKD) (HCC)   4. Vulvar lesion   5. Vaginal atrophy     History of CVA (cerebrovascular accident) Assessment & Plan: - discussed risk of neurogenic bladder due to CVA, T2DM with peripheral neuropathy - urodynamic testing with abnormal compliance, DO, and concerns for incomplete bladder emptying - pending renal US - referral sent to nephrology, patient desires to postpone repeat Cr until after referral  Orders: -     Basic metabolic panel -     US RENAL; Future -     Ambulatory Referral to Primary Care -     Ambulatory referral to Nephrology  Mixed stress and urge urinary incontinence Assessment & Plan: - prior POCT catheterized UA negative, PVR 40mL - 06/11/23 UA + heme, culture 80K klebsiella resistant to macrobid and ampicillin. Denies change in symptoms after antibiotic treatment - history of CVA, MI, denies urology workup - CKD with Cr 1.59 on 05/21/23 with recent ED visit for AKI and hyperkalemia - cannot use anti-cholinergics due to age and K supplementation - We discussed the symptoms of overactive bladder (OAB), which include urinary urgency, urinary frequency, nocturia, with or without urge incontinence.  While we do not know the exact etiology of OAB,  several treatment options exist. We discussed management including behavioral therapy (decreasing bladder irritants, urge suppression strategies, timed voids, bladder retraining), physical therapy, medication; for refractory cases posterior tibial nerve stimulation, sacral neuromodulation, and intravesical botulinum toxin injection.  For anticholinergic medications, we discussed the potential side effects of anticholinergics including dry eyes, dry mouth, constipation, cognitive impairment and urinary retention. For Beta-3 agonist medication, we discussed the potential side effect of elevated blood pressure which is more likely to occur in individuals with uncontrolled hypertension. - GFR > 30, Rx and samples provided for Gemtesa with relief for 1 week only and cost prohibitive - avoid anticholinergics due to age and potassium supplementation - urodynamics for history of CVA, T2DM with peripheral neuropathy, spinal stenosis with leg pain. Encouraged to optimize glycemic control - UDS with concerns for poor compliance, DO. Pending renal ultrasound. Discussed repeat Cr, patient declined and desired to postpone until nephrology referral. - encouraged behavioral modification and bladder training - encouraged Kegel exercises - performed CIC teaching, patient was able to perform CIC with some assistance For refractory OAB we reviewed the procedure for intravesical Botox injection with cystoscopy in the office and reviewed the risks, benefits and alternatives of treatment including but not limited to infection, need for self-catheterization and need for repeat therapy.  We discussed that there is a 5-15% chance of needing to catheterize with Botox and that  this usually resolves in a few months; however can persist for longer periods of time.  Typically Botox injections would need to be repeated every 3-12 months since this is not a permanent therapy.   We discussed the role of sacral neuromodulation and how it  works. It requires a test phase, and documentation of bladder function via diary. After a successful test period, a permanent wire and generator are placed in the OR. The battery lasts 5 years on average and would need to be replaced surgically.  The goal of this therapy is at least a 50% improvement in symptoms. It is NOT realistic to expect a 100% cure.  We reviewed the fact that about 30% of patients fail the test phase and are not candidates for permanent generator placement.  We discussed the risk of infection and that the patient would not be able to get an MRI once the device is placed. There are two companies that provide this therapy: [employer] and [employer]. [employer]' product is new and is similar to [employer]'s, but has advantages of a smaller and rechargeable battery and being able to have an MRI with the implant. For all procedures, we discussed risks of bleeding, infection, damage to surrounding organs including bowel, bladder, blood vessels, ureters and nerves, need for further surgery, risk of postoperative urinary incontinence or retention with need to catheterize, recurrent prolapse, numbness and weakness at any body site, buttock pain, and the rarer risks of blood clot, heart attack, pneumonia, death.    We also discussed the role of percutaneous tibial nerve stimulation and how it works.  She understands it requires 12 weekly visits for temporary neuromodulation of the sacral nerve roots via the tibial nerve and that she may then require continued tapered treatment.  She will return for the procedure. All questions were answered.  - For treatment of stress urinary incontinence,  non-surgical options include expectant management, weight loss, physical therapy, as well as a pessary.  Surgical options include a midurethral sling, Burch urethropexy, and transurethral injection of a bulking agent. - continue vaginal estrogen  Orders: -     Basic metabolic panel -     US RENAL; Future -      Ambulatory Referral to Primary Care -     Ambulatory referral to Nephrology  Stage 3a chronic kidney disease (CKD) (HCC) Assessment & Plan: - pt denies nephrology follow-up - referral sent to nephrology due to h/o stage III CKD - most recent Cr 1.52  - pending renal ultrasound to r/o hydronephrosis due to concerns of abnormal bladder compliance, intermittent incomplete bladder emptying and history of CVA in the setting of neurogenic bladder - discussed possible need for lasix renal scan  Orders: -     Ambulatory Referral to Primary Care -     Ambulatory referral to Nephrology  Vulvar lesion Assessment & Plan: - denies history of lichen sclerosus - provided pt with mirror and reviewed vulvar findings - provided [employer] booklet on vulvar diseases, reviewed proper vulvar care, comfort measures, treatments  - discussed importance of topical steroid use due to risk of malignancy and low threshold for repeat biopsy if biopsy results positive  - discussed possible referral to dermatology if insurance does not cover annual gyn exams - stop spray use for vulva and change to pH balanced unscented soaps - pending vulvar biopsies   Orders: -     Surgical pathology -     Surgical pathology  Vaginal atrophy Assessment & Plan: - For  symptomatic vaginal atrophy options include lubrication with a water-based lubricant, personal hygiene measures and barrier protection against wetness, and estrogen replacement in the form of vaginal cream, vaginal tablets, or a time-released vaginal ring.   - continue vaginal estrogen 1g 2x/week - reviewed anatomy and discuss applying some to posterior fourchette in 2-3 days after topical barrier ointment use for 2-3 days.   Time spent: I spent 92 minutes dedicated to the care of this patient on the date of this encounter to include pre-visit review of records, face-to-face time with the patient discussing mixed urinary incontinence, history of CVA,  CKD, vulvar lesion, vaginal atrophy and post visit documentation and ordering medication/ testing and referral to PCP and nephrology.  Loleta Chance, MD

## 2023-06-25 NOTE — Assessment & Plan Note (Signed)
-   discussed risk of neurogenic bladder due to CVA, T2DM with peripheral neuropathy - urodynamic testing with abnormal compliance, DO, and concerns for incomplete bladder emptying - pending renal US - referral sent to nephrology, patient desires to postpone repeat Cr until after referral

## 2023-06-25 NOTE — Assessment & Plan Note (Signed)
-   For symptomatic vaginal atrophy options include lubrication with a water-based lubricant, personal hygiene measures and barrier protection against wetness, and estrogen replacement in the form of vaginal cream, vaginal tablets, or a time-released vaginal ring.   - continue vaginal estrogen 1g 2x/week - reviewed anatomy and discuss applying some to posterior fourchette in 2-3 days after topical barrier ointment use for 2-3 days.

## 2023-06-25 NOTE — Assessment & Plan Note (Addendum)
 - prior POCT catheterized UA negative, PVR 40mL - 06/11/23 UA + heme, culture 80K klebsiella resistant to macrobid and ampicillin. Denies change in symptoms after antibiotic treatment - history of CVA, MI, denies urology workup - CKD with Cr 1.59 on 05/21/23 with recent ED visit for AKI and hyperkalemia - cannot use anti-cholinergics due to age and K supplementation - We discussed the symptoms of overactive bladder (OAB), which include urinary urgency, urinary frequency, nocturia, with or without urge incontinence.  While we do not know the exact etiology of OAB, several treatment options exist. We discussed management including behavioral therapy (decreasing bladder irritants, urge suppression strategies, timed voids, bladder retraining), physical therapy, medication; for refractory cases posterior tibial nerve stimulation, sacral neuromodulation, and intravesical botulinum toxin injection.  For anticholinergic medications, we discussed the potential side effects of anticholinergics including dry eyes, dry mouth, constipation, cognitive impairment and urinary retention. For Beta-3 agonist medication, we discussed the potential side effect of elevated blood pressure which is more likely to occur in individuals with uncontrolled hypertension. - GFR > 30, Rx and samples provided for Gemtesa with relief for 1 week only and cost prohibitive - avoid anticholinergics due to age and potassium supplementation - urodynamics for history of CVA, T2DM with peripheral neuropathy, spinal stenosis with leg pain. Encouraged to optimize glycemic control - UDS with concerns for poor compliance, DO. Pending renal ultrasound. Discussed repeat Cr, patient declined and desired to postpone until nephrology referral. - encouraged behavioral modification and bladder training - encouraged Kegel exercises - performed CIC teaching, patient was able to perform CIC with some assistance For refractory OAB we reviewed the procedure for  intravesical Botox injection with cystoscopy in the office and reviewed the risks, benefits and alternatives of treatment including but not limited to infection, need for self-catheterization and need for repeat therapy.  We discussed that there is a 5-15% chance of needing to catheterize with Botox and that this usually resolves in a few months; however can persist for longer periods of time.  Typically Botox injections would need to be repeated every 3-12 months since this is not a permanent therapy.   We discussed the role of sacral neuromodulation and how it works. It requires a test phase, and documentation of bladder function via diary. After a successful test period, a permanent wire and generator are placed in the OR. The battery lasts 5 years on average and would need to be replaced surgically.  The goal of this therapy is at least a 50% improvement in symptoms. It is NOT realistic to expect a 100% cure.  We reviewed the fact that about 30% of patients fail the test phase and are not candidates for permanent generator placement.  We discussed the risk of infection and that the patient would not be able to get an MRI once the device is placed. There are two companies that provide this therapy: Medtr[employer] and [employer]. [employer]' product is new and is similar to Medtronic's, but has advantages of a smaller and rechargeable battery and being able to have an MRI with the implant. For all procedures, we discussed risks of bleeding, infection, damage to surrounding organs including bowel, bladder, blood vessels, ureters and nerves, need for further surgery, risk of postoperative urinary incontinence or retention with need to catheterize, recurrent prolapse, numbness and weakness at any body site, buttock pain, and the rarer risks of blood clot, heart attack, pneumonia, death.    We also discussed the role of percutaneous tibial nerve stimulation and how it  works.  She understands it requires 12 weekly visits for  temporary neuromodulation of the sacral nerve roots via the tibial nerve and that she may then require continued tapered treatment.  She will return for the procedure. All questions were answered.  - For treatment of stress urinary incontinence,  non-surgical options include expectant management, weight loss, physical therapy, as well as a pessary.  Surgical options include a midurethral sling, Burch urethropexy, and transurethral injection of a bulking agent. - continue vaginal estrogen

## 2023-06-25 NOTE — Assessment & Plan Note (Signed)
-   pt denies nephrology follow-up - referral sent to nephrology due to h/o stage III CKD - most recent Cr 1.52  - pending renal ultrasound to r/o hydronephrosis due to concerns of abnormal bladder compliance, intermittent incomplete bladder emptying and history of CVA in the setting of neurogenic bladder - discussed possible need for lasix renal scan

## 2023-06-25 NOTE — Patient Instructions (Addendum)
 Taking Care of Yourself after catheterization   Drink plenty of water for a day or two following your procedure. Try to have about 8 ounces (one cup) at a time, and do this 6 times or more per day unless you have fluid restrictitons AVOID irritative beverages such as coffee, tea, soda, alcoholic or citrus drinks for a day or two, as this may cause burning with urination.  For the first 1-2 days after the procedure, your urine may be pink or red in color. You may have some blood in your urine as a normal side effect of the procedure. Large amounts of bleeding or difficulty urinating are NOT normal. Call the nurse line if this happens or go to the nearest Emergency Room if the bleeding is heavy or you cannot urinate at all and it is after hours. If you had a Bulkamid injection in the urethra and need to be catheterized, ask for a pediatric catheter to be used (size 10 or 12-French) so the material is not pushed out of place.   You may experience some discomfort or a burning sensation with urination after having this procedure. You can use over the counter Azo or pyridium to help with burning and follow the instructions on the packaging. If it does not improve within 1-2 days, or other symptoms appear (fever, chills, or difficulty urinating) call the office to speak to a nurse.  You may return to normal daily activities such as work, school, driving, exercising and housework on the day of the procedure.  - discussed proper vulvar care, warm compression, avoid pad use, cotton only underwear and barrier ointment if needed   A referral to primary care provider to establish care and kidney doctor (Nephrology).   Please follow-up with Dr. Richardson Dopp (GYN) if your biopsy results return for lichen sclerosus every 12 months.  - provided pt with mirror and reviewed vulvar findings - provided [employer] booklet on vulvar diseases, reviewed proper vulvar care, comfort measures, treatments  - discussed  importance of topical steroid use due to risk of malignancy and low threshold for repeat biopsy  For refractory OAB we reviewed the procedure for intravesical Botox injection with cystoscopy in the office and reviewed the risks, benefits and alternatives of treatment including but not limited to infection, need for self-catheterization and need for repeat therapy.  We discussed that there is a 5-15% chance of needing to catheterize with Botox and that this usually resolves in a few months; however can persist for longer periods of time.  Typically Botox injections would need to be repeated every 3-12 months since this is not a permanent therapy.   We discussed the role of sacral neuromodulation and how it works. It requires a test phase, and documentation of bladder function via diary. After a successful test period, a permanent wire and generator are placed in the OR. The battery lasts 5 years on average and would need to be replaced surgically.  The goal of this therapy is at least a 50% improvement in symptoms. It is NOT realistic to expect a 100% cure.  We reviewed the fact that about 30% of patients fail the test phase and are not candidates for permanent generator placement.  We discussed the risk of infection and that the patient would not be able to get an MRI once the device is placed. There are two companies that provide this therapy: [employer] and [employer]. [employer]' product is new and is similar to Med[employer]'s, but has advantages of a  smaller and rechargeable battery and being able to have an MRI with the implant. For all procedures, we discussed risks of bleeding, infection, damage to surrounding organs including bowel, bladder, blood vessels, ureters and nerves, need for further surgery, risk of postoperative urinary incontinence or retention with need to catheterize, recurrent prolapse, numbness and weakness at any body site, buttock pain, and the rarer risks of blood clot, heart attack, pneumonia,  death.    We also discussed the role of percutaneous tibial nerve stimulation and how it works.  She understands it requires 12 weekly visits for temporary neuromodulation of the sacral nerve roots via the tibial nerve and that she may then require continued tapered treatment.  She will return for the procedure. All questions were answered.

## 2023-06-25 NOTE — Assessment & Plan Note (Addendum)
-   denies history of lichen sclerosus - provided pt with mirror and reviewed vulvar findings - provided [employer] booklet on vulvar diseases, reviewed proper vulvar care, comfort measures, treatments  - discussed importance of topical steroid use due to risk of malignancy and low threshold for repeat biopsy if biopsy results positive  - discussed possible referral to dermatology if insurance does not cover annual gyn exams - stop spray use for vulva and change to pH balanced unscented soaps - pending vulvar biopsies

## 2023-06-26 LAB — SURGICAL PATHOLOGY

## 2023-06-26 MED ORDER — CLOBETASOL PROPIONATE E 0.05 % EX CREA: 0.5000 g | TOPICAL_CREAM | Freq: Every evening | CUTANEOUS | 1 refills | Status: DC

## 2023-06-26 NOTE — Addendum Note (Signed)
 Addended byWyatt Haste T on: 06/26/2023 01:20 PM   Modules accepted: Orders

## 2023-06-27 ENCOUNTER — Ambulatory Visit (HOSPITAL_COMMUNITY)
Admission: RE | Admit: 2023-06-27 | Discharge: 2023-06-27 | Disposition: A | Discharge status: Home / Self Care | Source: Ambulatory Visit | Attending: Obstetrics | Admitting: Obstetrics

## 2023-06-27 DIAGNOSIS — N281 Cyst of kidney, acquired: Secondary | ICD-10-CM | POA: Insufficient documentation

## 2023-06-27 DIAGNOSIS — R319 Hematuria, unspecified: Secondary | ICD-10-CM | POA: Diagnosis not present

## 2023-06-27 DIAGNOSIS — Z8673 Personal history of transient ischemic attack (TIA), and cerebral infarction without residual deficits: Secondary | ICD-10-CM | POA: Diagnosis present

## 2023-06-27 DIAGNOSIS — N3946 Mixed incontinence: Secondary | ICD-10-CM | POA: Insufficient documentation

## 2023-07-02 ENCOUNTER — Ambulatory Visit: Payer: Medicare PPO | Admitting: Obstetrics

## 2023-07-07 ENCOUNTER — Encounter: Payer: Self-pay | Admitting: Obstetrics

## 2023-07-30 ENCOUNTER — Telehealth: Payer: Self-pay | Admitting: Cardiovascular Disease

## 2023-07-30 DIAGNOSIS — I2510 Atherosclerotic heart disease of native coronary artery without angina pectoris: Secondary | ICD-10-CM

## 2023-07-30 DIAGNOSIS — E1169 Type 2 diabetes mellitus with other specified complication: Secondary | ICD-10-CM

## 2023-07-30 DIAGNOSIS — E785 Hyperlipidemia, unspecified: Secondary | ICD-10-CM

## 2023-07-30 NOTE — Telephone Encounter (Signed)
  Pt c/o medication issue:  1. Name of Medication: Evolocumab (REPATHA SURECLICK) 140 MG/ML SOAJ   2. How are you currently taking this medication (dosage and times per day)?   Inject 140 mg into the skin every 14 (fourteen) days.    3. Are you having a reaction (difficulty breathing--STAT)? No   4. What is your medication issue? Patient said that when she tried to take her Repatha shot but she messed it up. She called Repatha and was told they would send her a replacement, and she was advised to call us  so that she can get the replacement sooner.

## 2023-07-30 NOTE — Telephone Encounter (Signed)
 Called pt she states that she has 3 pens available will call pharmacy to adjust.  Called pharmacy they said to call the manufacturer to let them know since she has pens.  Called [employer] spoke with [facility]. She states that she "just spoke with her" and she started the process already. Verified rx with Debbie and ok'd to ship to patient.

## 2023-08-09 ENCOUNTER — Other Ambulatory Visit: Payer: Self-pay | Admitting: Cardiovascular Disease

## 2023-08-28 ENCOUNTER — Telehealth: Payer: Self-pay | Admitting: Cardiovascular Disease

## 2023-08-28 DIAGNOSIS — E785 Hyperlipidemia, unspecified: Secondary | ICD-10-CM

## 2023-08-28 DIAGNOSIS — E1169 Type 2 diabetes mellitus with other specified complication: Secondary | ICD-10-CM

## 2023-08-28 DIAGNOSIS — I2510 Atherosclerotic heart disease of native coronary artery without angina pectoris: Secondary | ICD-10-CM

## 2023-08-28 NOTE — Telephone Encounter (Signed)
 Pt c/o medication issue:  1. Name of Medication: Evolocumab  (REPATHA  SURECLICK) 140 MG/ML SOAJ   2. How are you currently taking this medication (dosage and times per day)? No  3. Are you having a reaction (difficulty breathing--STAT)? No  4. What is your medication issue? Pt went to get this medication from the pharmacy and she was told the medication would cost her $80. Please advise

## 2023-08-28 NOTE — Telephone Encounter (Signed)
 Patient identification verified by 2 forms. Sims Duck, RN     Called and spoke to patient  Patient states:  - Repatha  medication increased since last month.   Patient denies:              Interventions/Plan: - Recommended patient contact insurance company regarding change in cost. Potentially could be a matter of being in donut hole.  - She will call back to update us  if she needs further assistance from our office.   Patient agrees with plan, no questions at this time

## 2023-08-28 NOTE — Telephone Encounter (Signed)
 Pt c/o medication issue:  1. Name of Medication:   Evolocumab  (REPATHA  SURECLICK) 140 MG/ML SOAJ   2. How are you currently taking this medication (dosage and times per day)?   3. Are you having a reaction (difficulty breathing--STAT)?   4. What is your medication issue?   Patient stated she cannot afford the $80 for this medication and wants a call back from Pharmacist Larinda Plover to discuss next steps.

## 2023-08-29 ENCOUNTER — Ambulatory Visit: Admitting: Obstetrics

## 2023-09-11 ENCOUNTER — Other Ambulatory Visit (HOSPITAL_COMMUNITY): Payer: Self-pay

## 2023-09-11 MED ORDER — REPATHA SURECLICK 140 MG/ML ~~LOC~~ SOAJ
1.0000 mL | SUBCUTANEOUS | 3 refills | Status: DC
Start: 2023-09-11 — End: 2023-12-09
  Filled 2023-09-11 – 2023-09-18 (×2): qty 6, 84d supply, fill #0

## 2023-09-11 NOTE — Telephone Encounter (Signed)
 Spoke with patient and renewed her grant.  Card No. [id number] Card Status Active BIN [id number] PCN [id number] PC Group [id number]

## 2023-09-11 NOTE — Addendum Note (Signed)
 Addended by: Sunny English on: 09/11/2023 10:16 AM   Modules accepted: Orders

## 2023-09-16 ENCOUNTER — Inpatient Hospital Stay (HOSPITAL_COMMUNITY)
Admission: EM | Admit: 2023-09-16 | Discharge: 2023-09-18 | DRG: 683 | Disposition: A | Discharge status: Home / Self Care | Attending: Internal Medicine | Admitting: Internal Medicine

## 2023-09-16 ENCOUNTER — Emergency Department (HOSPITAL_COMMUNITY)

## 2023-09-16 ENCOUNTER — Other Ambulatory Visit: Payer: Self-pay

## 2023-09-16 ENCOUNTER — Encounter (HOSPITAL_COMMUNITY): Payer: Self-pay

## 2023-09-16 DIAGNOSIS — E875 Hyperkalemia: Secondary | ICD-10-CM | POA: Diagnosis not present

## 2023-09-16 DIAGNOSIS — F17210 Nicotine dependence, cigarettes, uncomplicated: Secondary | ICD-10-CM | POA: Diagnosis present

## 2023-09-16 DIAGNOSIS — Z91013 Allergy to seafood: Secondary | ICD-10-CM

## 2023-09-16 DIAGNOSIS — Z833 Family history of diabetes mellitus: Secondary | ICD-10-CM

## 2023-09-16 DIAGNOSIS — R001 Bradycardia, unspecified: Secondary | ICD-10-CM | POA: Diagnosis present

## 2023-09-16 DIAGNOSIS — Z7982 Long term (current) use of aspirin: Secondary | ICD-10-CM

## 2023-09-16 DIAGNOSIS — N1831 Chronic kidney disease, stage 3a: Secondary | ICD-10-CM | POA: Diagnosis present

## 2023-09-16 DIAGNOSIS — I252 Old myocardial infarction: Secondary | ICD-10-CM

## 2023-09-16 DIAGNOSIS — Z955 Presence of coronary angioplasty implant and graft: Secondary | ICD-10-CM

## 2023-09-16 DIAGNOSIS — Z8673 Personal history of transient ischemic attack (TIA), and cerebral infarction without residual deficits: Secondary | ICD-10-CM

## 2023-09-16 DIAGNOSIS — N179 Acute kidney failure, unspecified: Secondary | ICD-10-CM | POA: Diagnosis not present

## 2023-09-16 DIAGNOSIS — Z8249 Family history of ischemic heart disease and other diseases of the circulatory system: Secondary | ICD-10-CM

## 2023-09-16 DIAGNOSIS — E861 Hypovolemia: Secondary | ICD-10-CM | POA: Diagnosis present

## 2023-09-16 DIAGNOSIS — Z90710 Acquired absence of both cervix and uterus: Secondary | ICD-10-CM

## 2023-09-16 DIAGNOSIS — I959 Hypotension, unspecified: Secondary | ICD-10-CM | POA: Diagnosis present

## 2023-09-16 DIAGNOSIS — R072 Precordial pain: Secondary | ICD-10-CM | POA: Diagnosis not present

## 2023-09-16 DIAGNOSIS — Z79899 Other long term (current) drug therapy: Secondary | ICD-10-CM

## 2023-09-16 DIAGNOSIS — M545 Low back pain, unspecified: Secondary | ICD-10-CM | POA: Diagnosis present

## 2023-09-16 DIAGNOSIS — E860 Dehydration: Secondary | ICD-10-CM | POA: Diagnosis present

## 2023-09-16 DIAGNOSIS — Z66 Do not resuscitate: Secondary | ICD-10-CM | POA: Diagnosis present

## 2023-09-16 DIAGNOSIS — R197 Diarrhea, unspecified: Secondary | ICD-10-CM | POA: Diagnosis present

## 2023-09-16 DIAGNOSIS — I2510 Atherosclerotic heart disease of native coronary artery without angina pectoris: Secondary | ICD-10-CM | POA: Diagnosis present

## 2023-09-16 DIAGNOSIS — R339 Retention of urine, unspecified: Secondary | ICD-10-CM | POA: Diagnosis present

## 2023-09-16 DIAGNOSIS — T50995A Adverse effect of other drugs, medicaments and biological substances, initial encounter: Secondary | ICD-10-CM | POA: Diagnosis present

## 2023-09-16 DIAGNOSIS — Z7901 Long term (current) use of anticoagulants: Secondary | ICD-10-CM

## 2023-09-16 DIAGNOSIS — E1122 Type 2 diabetes mellitus with diabetic chronic kidney disease: Secondary | ICD-10-CM | POA: Diagnosis present

## 2023-09-16 DIAGNOSIS — I480 Paroxysmal atrial fibrillation: Secondary | ICD-10-CM | POA: Diagnosis present

## 2023-09-16 DIAGNOSIS — E872 Acidosis, unspecified: Secondary | ICD-10-CM | POA: Diagnosis present

## 2023-09-16 DIAGNOSIS — Z8049 Family history of malignant neoplasm of other genital organs: Secondary | ICD-10-CM

## 2023-09-16 DIAGNOSIS — Z7902 Long term (current) use of antithrombotics/antiplatelets: Secondary | ICD-10-CM

## 2023-09-16 DIAGNOSIS — Z7984 Long term (current) use of oral hypoglycemic drugs: Secondary | ICD-10-CM

## 2023-09-16 DIAGNOSIS — E7800 Pure hypercholesterolemia, unspecified: Secondary | ICD-10-CM | POA: Diagnosis present

## 2023-09-16 DIAGNOSIS — I129 Hypertensive chronic kidney disease with stage 1 through stage 4 chronic kidney disease, or unspecified chronic kidney disease: Secondary | ICD-10-CM | POA: Diagnosis present

## 2023-09-16 LAB — CHLORIDE, URINE, RANDOM: Chloride Urine: 79 mmol/L

## 2023-09-16 LAB — COMPREHENSIVE METABOLIC PANEL WITH GFR
ALT: 11 U/L (ref 0–44)
AST: 15 U/L (ref 15–41)
Albumin: 3.8 g/dL (ref 3.5–5.0)
Alkaline Phosphatase: 53 U/L (ref 38–126)
Anion gap: 6 (ref 5–15)
BUN: 68 mg/dL — ABNORMAL HIGH (ref 8–23)
CO2: 15 mmol/L — ABNORMAL LOW (ref 22–32)
Calcium: 10.4 mg/dL — ABNORMAL HIGH (ref 8.9–10.3)
Chloride: 114 mmol/L — ABNORMAL HIGH (ref 98–111)
Creatinine, Ser: 2.98 mg/dL — ABNORMAL HIGH (ref 0.44–1.00)
GFR, Estimated: 15 mL/min — ABNORMAL LOW (ref >60)
Glucose, Bld: 126 mg/dL — ABNORMAL HIGH (ref 70–99)
Potassium: >7.5 mmol/L (ref 3.5–5.1)
Sodium: 135 mmol/L (ref 135–145)
Total Bilirubin: 0.4 mg/dL (ref 0.0–1.2)
Total Protein: 6.7 g/dL (ref 6.5–8.1)

## 2023-09-16 LAB — CBC WITH DIFFERENTIAL/PLATELET
Abs Immature Granulocytes: 0.03 10*3/uL (ref 0.00–0.07)
Basophils Absolute: 0 10*3/uL (ref 0.0–0.1)
Basophils Relative: 0 %
Eosinophils Absolute: 0.1 10*3/uL (ref 0.0–0.5)
Eosinophils Relative: 1 %
HCT: 35.8 % — ABNORMAL LOW (ref 36.0–46.0)
Hemoglobin: 11.4 g/dL — ABNORMAL LOW (ref 12.0–15.0)
Immature Granulocytes: 0 %
Lymphocytes Relative: 20 %
Lymphs Abs: 2.1 10*3/uL (ref 0.7–4.0)
MCH: 31.2 pg (ref 26.0–34.0)
MCHC: 31.8 g/dL (ref 30.0–36.0)
MCV: 98.1 fL (ref 80.0–100.0)
Monocytes Absolute: 0.4 10*3/uL (ref 0.1–1.0)
Monocytes Relative: 4 %
Neutro Abs: 7.8 10*3/uL — ABNORMAL HIGH (ref 1.7–7.7)
Neutrophils Relative %: 75 %
Platelets: 306 10*3/uL (ref 150–400)
RBC: 3.65 MIL/uL — ABNORMAL LOW (ref 3.87–5.11)
RDW: 12.7 % (ref 11.5–15.5)
WBC: 10.4 10*3/uL (ref 4.0–10.5)
nRBC: 0 % (ref 0.0–0.2)

## 2023-09-16 LAB — URINALYSIS, ROUTINE W REFLEX MICROSCOPIC
Bilirubin Urine: NEGATIVE
Glucose, UA: 50 mg/dL — AB
Hgb urine dipstick: NEGATIVE
Ketones, ur: NEGATIVE mg/dL
Nitrite: NEGATIVE
Protein, ur: NEGATIVE mg/dL
Specific Gravity, Urine: 1.012 (ref 1.005–1.030)
pH: 5 (ref 5.0–8.0)

## 2023-09-16 LAB — BASIC METABOLIC PANEL WITH GFR
Anion gap: 6 (ref 5–15)
Anion gap: 8 (ref 5–15)
Anion gap: 8 (ref 5–15)
BUN: 67 mg/dL — ABNORMAL HIGH (ref 8–23)
BUN: 63 mg/dL — ABNORMAL HIGH (ref 8–23)
BUN: 61 mg/dL — ABNORMAL HIGH (ref 8–23)
CO2: 16 mmol/L — ABNORMAL LOW (ref 22–32)
CO2: 12 mmol/L — ABNORMAL LOW (ref 22–32)
CO2: 13 mmol/L — ABNORMAL LOW (ref 22–32)
Calcium: 10.6 mg/dL — ABNORMAL HIGH (ref 8.9–10.3)
Calcium: 10.1 mg/dL (ref 8.9–10.3)
Calcium: 10.1 mg/dL (ref 8.9–10.3)
Chloride: 112 mmol/L — ABNORMAL HIGH (ref 98–111)
Chloride: 114 mmol/L — ABNORMAL HIGH (ref 98–111)
Chloride: 114 mmol/L — ABNORMAL HIGH (ref 98–111)
Creatinine, Ser: 2.86 mg/dL — ABNORMAL HIGH (ref 0.44–1.00)
Creatinine, Ser: 2.75 mg/dL — ABNORMAL HIGH (ref 0.44–1.00)
Creatinine, Ser: 2.66 mg/dL — ABNORMAL HIGH (ref 0.44–1.00)
GFR, Estimated: 16 mL/min — ABNORMAL LOW (ref >60)
GFR, Estimated: 17 mL/min — ABNORMAL LOW (ref >60)
GFR, Estimated: 18 mL/min — ABNORMAL LOW (ref >60)
Glucose, Bld: 124 mg/dL — ABNORMAL HIGH (ref 70–99)
Glucose, Bld: 145 mg/dL — ABNORMAL HIGH (ref 70–99)
Glucose, Bld: 139 mg/dL — ABNORMAL HIGH (ref 70–99)
Potassium: >7.5 mmol/L (ref 3.5–5.1)
Potassium: 6.7 mmol/L (ref 3.5–5.1)
Potassium: 5.8 mmol/L — ABNORMAL HIGH (ref 3.5–5.1)
Sodium: 134 mmol/L — ABNORMAL LOW (ref 135–145)
Sodium: 134 mmol/L — ABNORMAL LOW (ref 135–145)
Sodium: 135 mmol/L (ref 135–145)

## 2023-09-16 LAB — LIPASE, BLOOD: Lipase: 51 U/L (ref 11–51)

## 2023-09-16 LAB — GLUCOSE, CAPILLARY: Glucose-Capillary: 173 mg/dL — ABNORMAL HIGH (ref 70–99)

## 2023-09-16 LAB — TROPONIN I (HIGH SENSITIVITY)
Troponin I (High Sensitivity): 18 ng/L — ABNORMAL HIGH (ref <18)
Troponin I (High Sensitivity): 17 ng/L (ref <18)

## 2023-09-16 LAB — BRAIN NATRIURETIC PEPTIDE: B Natriuretic Peptide: 170.8 pg/mL — ABNORMAL HIGH (ref 0.0–100.0)

## 2023-09-16 LAB — NA AND K (SODIUM & POTASSIUM), RAND UR
Potassium Urine: 44 mmol/L
Sodium, Ur: 57 mmol/L

## 2023-09-16 LAB — CBG MONITORING, ED
Glucose-Capillary: 116 mg/dL — ABNORMAL HIGH (ref 70–99)
Glucose-Capillary: 110 mg/dL — ABNORMAL HIGH (ref 70–99)

## 2023-09-16 MED ORDER — SODIUM CHLORIDE 0.9 % IV BOLUS
500.0000 mL | INTRAVENOUS | Status: AC
  Administered 2023-09-16: 500 mL via INTRAVENOUS

## 2023-09-16 MED ORDER — SODIUM CHLORIDE 0.9 % IV BOLUS
1000.0000 mL | Freq: Once | INTRAVENOUS | Status: AC
  Administered 2023-09-16: 1000 mL via INTRAVENOUS

## 2023-09-16 MED ORDER — CALCIUM GLUCONATE-NACL 1-0.675 GM/50ML-% IV SOLN
1.0000 g | Freq: Once | INTRAVENOUS | Status: AC
  Administered 2023-09-16: 1000 mg via INTRAVENOUS
  Filled 2023-09-16 (×2): qty 50

## 2023-09-16 MED ORDER — GABAPENTIN 600 MG PO TABS: 600.0000 mg | ORAL_TABLET | Freq: Every day | ORAL | Status: DC

## 2023-09-16 MED ORDER — METOPROLOL SUCCINATE ER 50 MG PO TB24
50.0000 mg | ORAL_TABLET | Freq: Every day | ORAL | Status: DC
  Administered 2023-09-16: 50 mg via ORAL
  Filled 2023-09-16 (×2): qty 1

## 2023-09-16 MED ORDER — APIXABAN 5 MG PO TABS
5.0000 mg | ORAL_TABLET | Freq: Two times a day (BID) | ORAL | Status: DC
  Administered 2023-09-16 – 2023-09-18 (×4): 5 mg via ORAL
  Filled 2023-09-16 (×4): qty 1

## 2023-09-16 MED ORDER — INSULIN ASPART 100 UNIT/ML IV SOLN
5.0000 [IU] | INTRAVENOUS | Status: AC
  Administered 2023-09-16: 5 [IU] via INTRAVENOUS

## 2023-09-16 MED ORDER — ALBUTEROL SULFATE (2.5 MG/3ML) 0.083% IN NEBU
10.0000 mg | INHALATION_SOLUTION | RESPIRATORY_TRACT | Status: AC
  Administered 2023-09-16: 10 mg via RESPIRATORY_TRACT
  Filled 2023-09-16: qty 12

## 2023-09-16 MED ORDER — ACETAMINOPHEN 500 MG PO TABS
1000.0000 mg | ORAL_TABLET | Freq: Three times a day (TID) | ORAL | Status: DC | PRN
  Administered 2023-09-16: 1000 mg via ORAL
  Filled 2023-09-16: qty 2

## 2023-09-16 MED ORDER — SODIUM ZIRCONIUM CYCLOSILICATE 10 G PO PACK
10.0000 g | PACK | Freq: Once | ORAL | Status: AC
  Administered 2023-09-16: 10 g via ORAL
  Filled 2023-09-16: qty 1

## 2023-09-16 MED ORDER — AMLODIPINE BESYLATE 10 MG PO TABS
10.0000 mg | ORAL_TABLET | Freq: Every day | ORAL | Status: DC
  Administered 2023-09-16: 10 mg via ORAL
  Filled 2023-09-16: qty 2
  Filled 2023-09-16: qty 1

## 2023-09-16 MED ORDER — GABAPENTIN 300 MG PO CAPS
300.0000 mg | ORAL_CAPSULE | Freq: Every day | ORAL | Status: DC
  Administered 2023-09-16 – 2023-09-17 (×2): 300 mg via ORAL
  Filled 2023-09-16 (×2): qty 1

## 2023-09-16 MED ORDER — CLOPIDOGREL BISULFATE 75 MG PO TABS
75.0000 mg | ORAL_TABLET | Freq: Every day | ORAL | Status: DC
  Administered 2023-09-16 – 2023-09-18 (×3): 75 mg via ORAL
  Filled 2023-09-16 (×3): qty 1

## 2023-09-16 MED ORDER — POLYETHYLENE GLYCOL 3350 17 G PO PACK
17.0000 g | PACK | Freq: Every day | ORAL | Status: DC
  Administered 2023-09-18: 17 g via ORAL
  Filled 2023-09-16 (×2): qty 1

## 2023-09-16 MED ORDER — FENTANYL CITRATE PF 50 MCG/ML IJ SOSY
50.0000 ug | PREFILLED_SYRINGE | Freq: Once | INTRAMUSCULAR | Status: AC
  Administered 2023-09-16: 50 ug via INTRAVENOUS
  Filled 2023-09-16: qty 1

## 2023-09-16 MED ORDER — ASPIRIN 81 MG PO TBEC
81.0000 mg | DELAYED_RELEASE_TABLET | Freq: Every day | ORAL | Status: DC
  Administered 2023-09-16 – 2023-09-17 (×2): 81 mg via ORAL
  Filled 2023-09-16 (×2): qty 1

## 2023-09-16 MED ORDER — SODIUM ZIRCONIUM CYCLOSILICATE 10 G PO PACK
10.0000 g | PACK | ORAL | Status: DC
  Filled 2023-09-16: qty 1

## 2023-09-16 MED ORDER — SENNOSIDES-DOCUSATE SODIUM 8.6-50 MG PO TABS
1.0000 | ORAL_TABLET | Freq: Every day | ORAL | Status: DC
  Filled 2023-09-16: qty 1

## 2023-09-16 MED ORDER — INSULIN ASPART 100 UNIT/ML IV SOLN
5.0000 [IU] | Freq: Once | INTRAVENOUS | Status: AC
  Administered 2023-09-16: 5 [IU] via INTRAVENOUS

## 2023-09-16 MED ORDER — DEXTROSE 50 % IV SOLN
1.0000 | Freq: Once | INTRAVENOUS | Status: AC
  Administered 2023-09-16: 50 mL via INTRAVENOUS
  Filled 2023-09-16: qty 50

## 2023-09-16 MED ORDER — ROSUVASTATIN CALCIUM 5 MG PO TABS
25.0000 mg | ORAL_TABLET | Freq: Every day | ORAL | Status: DC
  Administered 2023-09-16 – 2023-09-18 (×3): 25 mg via ORAL
  Filled 2023-09-16 (×3): qty 1

## 2023-09-16 MED ORDER — ONDANSETRON HCL 4 MG/2ML IJ SOLN
4.0000 mg | Freq: Once | INTRAMUSCULAR | Status: AC
  Administered 2023-09-16: 4 mg via INTRAVENOUS
  Filled 2023-09-16: qty 2

## 2023-09-16 MED ORDER — CALCIUM GLUCONATE 10 % IV SOLN
1.0000 g | Freq: Once | INTRAVENOUS | Status: AC
  Administered 2023-09-16: 1 g via INTRAVENOUS
  Filled 2023-09-16: qty 10

## 2023-09-16 MED ORDER — TRIMETHOBENZAMIDE HCL 100 MG/ML IM SOLN
200.0000 mg | Freq: Once | INTRAMUSCULAR | Status: AC
  Administered 2023-09-16: 200 mg via INTRAMUSCULAR
  Filled 2023-09-16: qty 2

## 2023-09-16 MED ORDER — ISOSORBIDE MONONITRATE ER 30 MG PO TB24
30.0000 mg | ORAL_TABLET | Freq: Every day | ORAL | Status: DC
  Administered 2023-09-16: 30 mg via ORAL
  Filled 2023-09-16 (×2): qty 1

## 2023-09-16 MED ORDER — DEXTROSE 50 % IV SOLN
1.0000 | INTRAVENOUS | Status: AC
  Administered 2023-09-16: 50 mL via INTRAVENOUS
  Filled 2023-09-16: qty 50

## 2023-09-16 MED ORDER — LIDOCAINE 5 % EX PTCH
1.0000 | MEDICATED_PATCH | CUTANEOUS | Status: DC
  Administered 2023-09-16 – 2023-09-17 (×2): 1 via TRANSDERMAL
  Filled 2023-09-16 (×2): qty 1

## 2023-09-16 NOTE — ED Triage Notes (Signed)
 PT BIB [facility] from home after PT experienced CP and SOB that began around 0330. PT has Hx of MI, received 324 aspirin  0.4 nitroglycerin  sublingual, 4mg  Zofran . Aox4.  EMS VS: BP 137/60, HR 68, 99% on RA, RR 20, CBG 124

## 2023-09-16 NOTE — Hospital Course (Addendum)
 Debbie [name] is a 78 y.o. female with a past medical history of T2DM, HLD, HTN, CAD w/MI s/p stent placement x3, CVA, and stage 3a CKD who presented after acute onset of chest pain and shortness of breath with several days of low back pain, cleared from a cardiology perspective, then admitted for AKI with associated hyperkalemia.   Below is a description of the patient's hospitalization by problem.   Hyperkalemia  Patient found to be hyperkalemic to > 7.5. Suspect multifactorial given history of documented CKD 3a, combination of several renally toxic medications in home regimen including Lasix , Metformin , daily oxycodone , olmesartan , potassium, and Ketorolac , as well as decreased intake. All nephrotoxic medications were held throughout admission. Her EKGs did not show any changes. Her K+ initially remained elevated despite several rounds of temporizing measures including Lokelma/calcium  gluconate/insulin /albuterol/IVF. Nephrology was consulted, who providing assistance in continuing temporizing measures including bicarb infusion. With continued temporizing measures, her K+ decreased to 4.0 by the day of discharge and she was tolerating PO well.   AKI  Stage 3a CKD  Non-anion gap metabolic acidosis  Hx urinary retention  Cr elevated to 2.98 on admission from baseline 1.5 in 05/2023. Likely a combination of prerenal and intrarenal etiology given reduced PO intake over the last week and renally toxic medications as described above. Given prior history of urinary retention, she was monitored closely but maintained adequate UOP. Urine lytes were obtained given non-anion gap metabolic acidosis noted on labs, however these were unremarkable. On day of discharge, her Cr had improved close to her baseline.   Bilateral low back pain  Presented with several days of dull, bilateral low back pain. Suspect musculoskeletal etiology given tenderness to palpation was localized to the bilateral paraspinal region. No  red flag symptoms such as nighttime awakenings, bowel or bladder incontinence, or midline spinal tenderness. CT renal stone study was unremarkable. Her pain improved on day of discharge and was managed with PRN Tylenol  and lidocaine  patches.   Chest pain, resolved  Hx CAD s/p PCI RCA (2003, 2023) Initial trop 18, repeat stable at 17. EKG without ischemic changes. Cardiology was consulted in the ED, who supported low suspicion for ACS given negative delta trop. Her chest pain resolved throughout admission. Her home Plavix  was continued.   Atrial fibrillation: Continued home Eliquis . HELD metoprolol  due to soft blood pressures.  Hypertension: HELD home amlodipine  and Imdur  due to soft blood pressures.  T2DM: Held home Metformin  as described above.  Hyperlipidemia: Switched home rosuvastatin  to atorvastatin  due to AKI. Will continue atorvastatin  at time of discharge.

## 2023-09-16 NOTE — ED Provider Notes (Addendum)
 [place] EMERGENCY DEPARTMENT AT [facility] Provider Note   CSN: [id number] Arrival date & time: 09/16/23  0831     History  Chief Complaint  Patient presents with   Chest Pain    Debbie [name] is a 78 y.o. female.  Patient here with chest pain and shortness of breath that occurred in the middle the night.  Lasted for several hours went away and then came back again this morning.  She got aspirin  nitroglycerin  with EMS without much relief.  She is given Zofran .  She had normal vitals with EMS.  She has history of CAD hypertension high cholesterol stroke A-fib on Eliquis .  Former smoker states that she quit 6 months ago.  Does not really use inhaler often.  She states that she has noticed some chest pain with exertion the last couple weeks and saw her primary care doctor about this a couple of times.  She has history of diabetes.  She denies any cough fever or sputum production.  She is having ongoing pain now.  The history is provided by the patient.       Home Medications Prior to Admission medications   Medication Sig Start Date End Date Taking? Authorizing Provider  acetaminophen  (TYLENOL ) 500 MG tablet Take 2 tablets (1,000 mg total) by mouth every 8 (eight) hours as needed. Patient taking differently: Take 1,000 mg by mouth every 8 (eight) hours as needed for moderate pain (pain score 4-6). 03/30/20   Cole, Tara, MD  albuterol (VENTOLIN HFA) 108 (90 Base) MCG/ACT inhaler Inhale 2 puffs into the lungs every 6 (six) hours as needed for shortness of breath or wheezing. 01/06/23   [provider]  amLODipine  (NORVASC ) 10 MG tablet Take 1 tablet (10 mg total) by mouth daily. 03/28/23 06/25/23  West, Katlyn D, NP  apixaban  (ELIQUIS ) 5 MG TABS tablet Take 1 tablet by mouth twice daily 10/07/22   Millicent Ally, MD  aspirin  EC 81 MG tablet Take 1 tablet (81 mg total) by mouth daily. Swallow whole. 02/04/23   Millicent Ally, MD  Clobetasol  Prop Emollient Base  (CLOBETASOL  PROPIONATE E) 0.05 % emollient cream Apply 1 Application topically at bedtime. 06/26/23   Darlene Ehlers, MD  clopidogrel  (PLAVIX ) 75 MG tablet Take 75 mg by mouth daily. 04/27/23   [provider]  estradiol  (ESTRACE ) 0.1 MG/GM vaginal cream Place 0.5 g vaginally 2 (two) times a week. Place 0.5g nightly for two weeks then twice a week after 06/05/23   Wyonia Hefty T, MD  Evolocumab  (REPATHA  SURECLICK) 140 MG/ML SOAJ Inject 140 mg into the skin every 14 (fourteen) days. 09/11/23   Pavero, Veryl Gottron, RPH  ezetimibe  (ZETIA ) 10 MG tablet Take 1 tablet (10 mg total) by mouth daily. 06/10/23   Millicent Ally, MD  furosemide  (LASIX ) 20 MG tablet Take 1 tablet by mouth once daily 08/12/23   Millicent Ally, MD  isosorbide  mononitrate (IMDUR ) 30 MG 24 hr tablet Take 1 tablet (30 mg total) by mouth daily. 03/28/23   West, Katlyn D, NP  meloxicam (MOBIC) 7.5 MG tablet Take 7.5 mg by mouth 2 (two) times daily as needed for pain. 11/26/22   [provider]  metFORMIN  (GLUCOPHAGE ) 500 MG tablet Take 1 tablet (500 mg total) by mouth 2 (two) times daily. Patient taking differently: Take 1,000 mg by mouth 2 (two) times daily with a meal. 08/02/21   Pokhrel, Laxman, MD  metoprolol  succinate (TOPROL -XL) 100 MG 24 hr tablet TAKE  1 TABLET BY MOUTH ONCE DAILY WITH FOOD OR IMMEDIATELY FOLLOWING MEAL 11/14/22   Walker, Caitlin S, NP  Multiple Vitamin (MULTIVITAMIN) capsule Take 1 capsule by mouth daily.    [provider]  nitroGLYCERIN  (NITROSTAT ) 0.4 MG SL tablet Place 1 tablet (0.4 mg total) under the tongue every 5 (five) minutes as needed for chest pain. 04/05/21   Millicent Ally, MD  olmesartan  (BENICAR ) 40 MG tablet Take 1 tablet (40 mg total) by mouth daily. 09/12/22   Millicent Ally, MD  potassium chloride  SA (KLOR-CON  M) 20 MEQ tablet Take 1 tablet (20 mEq total) by mouth daily. 04/01/23   West, Katlyn D, NP  rosuvastatin  (CRESTOR ) 20 MG tablet Take 1 tablet (20 mg total) by mouth daily.  06/17/23   Millicent Ally, MD      Allergies    Patient has no known allergies.    Review of Systems   Review of Systems  Physical Exam Updated Vital Signs BP (!) 173/55   Pulse 63   Temp (!) 97.2 F (36.2 C) (Axillary)   Resp (!) 21   Ht 5\' 6"  (1.676 m)   Wt 79.4 kg   SpO2 100%   BMI 28.25 kg/m  Physical Exam Vitals and nursing note reviewed.  Constitutional:      General: She is not in acute distress.    Appearance: She is well-developed. She is not ill-appearing.  HENT:     Head: Normocephalic and atraumatic.  Eyes:     Extraocular Movements: Extraocular movements intact.     Conjunctiva/sclera: Conjunctivae normal.     Pupils: Pupils are equal, round, and reactive to light.  Cardiovascular:     Rate and Rhythm: Normal rate and regular rhythm.     Pulses:          Radial pulses are 2+ on the right side and 2+ on the left side.     Heart sounds: Normal heart sounds. No murmur heard. Pulmonary:     Effort: Pulmonary effort is normal. No respiratory distress.     Breath sounds: Normal breath sounds.  Chest:     Comments: Mild tenderness to palpation of the left side of her chest Abdominal:     Palpations: Abdomen is soft.     Tenderness: There is no abdominal tenderness.  Musculoskeletal:        General: No swelling. Normal range of motion.     Cervical back: Normal range of motion and neck supple.     Right lower leg: No edema.     Left lower leg: No edema.  Skin:    General: Skin is warm and dry.     Capillary Refill: Capillary refill takes less than 2 seconds.  Neurological:     Mental Status: She is alert.  Psychiatric:        Mood and Affect: Mood normal.     ED Results / Procedures / Treatments   Labs (all labs ordered are listed, but only abnormal results are displayed) Labs Reviewed  CBC WITH DIFFERENTIAL/PLATELET - Abnormal; Notable for the following components:      Result Value   RBC 3.65 (*)    Hemoglobin 11.4 (*)    HCT 35.8 (*)     Neutro Abs 7.8 (*)    All other components within normal limits  BRAIN NATRIURETIC PEPTIDE - Abnormal; Notable for the following components:   B Natriuretic Peptide 170.8 (*)    All other components within normal limits  COMPREHENSIVE METABOLIC PANEL WITH GFR - Abnormal; Notable for the following components:   Potassium >7.5 (*)    Chloride 114 (*)    CO2 15 (*)    Glucose, Bld 126 (*)    BUN 68 (*)    Creatinine, Ser 2.98 (*)    Calcium  10.4 (*)    GFR, Estimated 15 (*)    All other components within normal limits  TROPONIN I (HIGH SENSITIVITY) - Abnormal; Notable for the following components:   Troponin I (High Sensitivity) 18 (*)    All other components within normal limits  LIPASE, BLOOD  URINALYSIS, ROUTINE W REFLEX MICROSCOPIC  TROPONIN I (HIGH SENSITIVITY)    EKG EKG Interpretation Date/Time:  Tuesday September 16 2023 08:41:06 EDT Ventricular Rate:  64 PR Interval:  164 QRS Duration:  88 QT Interval:  348 QTC Calculation: 359 R Axis:   53  Text Interpretation: Sinus rhythm Confirmed by Lowery Rue [phone]) on 09/16/2023 8:47:14 AM  Radiology DG Chest Portable 1 View Result Date: 09/16/2023 CLINICAL DATA:  Chest pain EXAM: PORTABLE CHEST 1 VIEW COMPARISON:  March 18, 2023 FINDINGS: The heart size and mediastinal contours are within normal limits. Both lungs are clear. The visualized skeletal structures are unremarkable. IMPRESSION: No active disease. Electronically Signed   By: Fredrich Jefferson M.D.   On: 09/16/2023 09:13    Procedures .Critical Care  Performed by: Lowery Rue, DO Authorized by: Lowery Rue, DO   Critical care provider statement:    Critical care time (minutes):  35   Critical care was necessary to treat or prevent imminent or life-threatening deterioration of the following conditions: hyperkalemia.   Critical care was time spent personally by me on the following activities:  Blood draw for specimens, development of treatment plan with patient or  surrogate, discussions with consultants, evaluation of patient's response to treatment, examination of patient, obtaining history from patient or surrogate, ordering and performing treatments and interventions, ordering and review of laboratory studies, ordering and review of radiographic studies, pulse oximetry, re-evaluation of patient's condition and review of old charts   I assumed direction of critical care for this patient from another provider in my specialty: no     Care discussed with: admitting provider       Medications Ordered in ED Medications  sodium chloride  0.9 % bolus 1,000 mL (has no administration in time range)  calcium  gluconate inj 10% (1 g) URGENT USE ONLY! (has no administration in time range)  insulin  aspart (novoLOG ) injection 5 Units (has no administration in time range)    And  dextrose  50 % solution 50 mL (has no administration in time range)  sodium zirconium cyclosilicate (LOKELMA) packet 10 g (has no administration in time range)  ondansetron  (ZOFRAN ) injection 4 mg (has no administration in time range)  fentaNYL  (SUBLIMAZE ) injection 50 mcg (50 mcg Intravenous Given 09/16/23 0959)    ED Course/ Medical Decision Making/ A&P                                 Medical Decision Making Amount and/or Complexity of Data Reviewed Labs: ordered. Radiology: ordered.  Risk OTC drugs. Prescription drug management. Decision regarding hospitalization.   Jeppie Moles is here with chest pain shortness of breath.  History of CAD hypertension high cholesterol stroke A-fib on Eliquis .  History of diabetes.  Seems like she had cardiac intervention a couple years ago on a heart cath.  She states to me that she has had some exertional chest pain here the last few weeks.  Saw her primary care doctor.  Has not seen cardiology.  Woke up this morning around 3:30 AM with chest pain that lasted until 8 AM and then came back again at 9.  She denies any cough sputum production fever  or chills.  Denies any change in activities.  I do appreciate some reproducible tenderness on exam but she states that she does not think that it is musculoskeletal.  She does not know if this was like her prior heart attacks.  She denies asthma wheezing.  No leg swelling.  Overall differential is wide could be MSK pain could be ACS I doubt PE given that she is on anticoagulation, does not appear to be in any heart failure.  I do not appreciate any wheezing on exam and doubt reactive airway process.  Does not sound like pneumonia pneumothorax but will get CBC CMP lipase BNP troponin chest x-ray and EKG.  Will give IV fentanyl .  Nitroglycerin  and aspirin  did not help her pain with EMS.  Reassuring vitals otherwise on exam.  EKG shows sinus rhythm.  No obvious ischemic changes per my review interpretation.  Troponin 19 and 17.  Seen by cardiology who did not think things were cardiac in nature and I agree however her potassium came back twice at greater than 6.  Was 6.2 and 7.5.  Creatinine is 2.98.  Lab work otherwise unremarkable.  Will give her dextrose  insulin  Lokelma IV fluids and admit to hospitalist for further care.  Will get renal CT urinalysis but she is not having any pain at this time.  Will admit for further hyperkalemia and AKI care.  This chart was dictated using voice recognition software.  Despite best efforts to proofread,  errors can occur which can change the documentation meaning.         Final Clinical Impression(s) / ED Diagnoses Final diagnoses:  Hyperkalemia  AKI (acute kidney injury) [facility])    Rx / DC Orders ED Discharge Orders     None         Lowery Rue, DO 09/16/23 1414    Lowery Rue, DO 09/16/23 1427

## 2023-09-16 NOTE — H&P (Signed)
 Date: 09/16/2023               Patient Name:  Debbie [name] MRN: [id number]  DOB: 1945-10-24 Age / Sex: 78 y.o., female   PCP: [name], MD              Medical Service: Internal Medicine Teaching Service              Attending Physician: Dr. Driscilla George, MD    First Contact: Harlie Lieu, MS4 Pager:   Second Contact: Dr. Diann Forth  Pager: [phone]  Third Contact Dr. Rozelle Corning Pager: [phone]       After Hours (After 5p/  First Contact Pager: [phone]  weekends / holidays): Second Contact Pager: [phone]   Chief Complaint: Back and chest pain   History of Present Illness:   Patient reports onset of bilateral low back pain yesterday afternoon with no clear inciting incident. No preceding injury or trauma to the region. She was seen at her PCP, where she received an injection of pain medication and was sent home with a new oral pain medication to start (unable to remember the name). Her pain improved somewhat for the rest of the day, but she woke up with chest pain and shortness of breath in the middle of the night, prompting her to call EMS. Describes a sharp pain to the center of her chest and feeling like she needed to gasp for air. She received aspirin  and nitroglycerin  with EMS which helped her pain an hour or two later. Notes that she experienced some dizziness with a "fuzzy" feeling in her head during onset of pain, however denies room-spinning sensation or presyncope. Denies diaphoresis, abdominal pain, or vomiting. She has felt nauseous since arriving in the ED and has been spitting up/gagging with some hiccups as well (unusual for her).   Denies any recent illness, cough, congestion, or rhinorrhea. No recent fevers or chills. Does endorse approximately 1 week of looser stools however without visualized blood and not watery. Feels that she has been urinating somewhat more than usual. Has been eating less due to feeling unwell but is still drinking fluids at baseline.   Medical history  includes T2DM, HLD, HTN, CAD s/p MI, CVA, CKD stage 3a, and Vitamin D deficiency. No known allergies. She has been taking potassium supplements for the last several months. Only new medictions include Repatha  (for HLD) and pain medication given at PCP (Toradol , per chart review). Of note, reports that she passed out ~3 weeks ago, hit the back of her head, and needed staples in her head. No falls or episodes of syncope since then.    Past Medical History:  Past Medical History:  Diagnosis Date   Arthritis    CAD (coronary artery disease) 11/1999   RCA stent X3, patent '06. Nuc low risk 9/13   Cataract    beginning stages   Diabetes mellitus without complication (HCC)    Dysrhythmia    History of stress test 01/08/2012   Showed minimal anterior thinning not felt to be significant.   Hx of echocardiogram 12/12/2011   EF>55%   Hypercholesteremia    Hypertension    Myocardial infarction [facility])    18 years ago    age 52   PAF (paroxysmal atrial fibrillation) (HCC) 11/2011   SSS component with some bradycardia   Stroke (HCC) 1998   Vitamin D deficiency    Meds:  Current Meds  Medication Sig   acetaminophen  (TYLENOL ) 500 MG tablet Take 2 tablets (  1,000 mg total) by mouth every 8 (eight) hours as needed. (Patient taking differently: Take 1,000 mg by mouth every 8 (eight) hours as needed for moderate pain (pain score 4-6).)   albuterol (VENTOLIN HFA) 108 (90 Base) MCG/ACT inhaler Inhale 2 puffs into the lungs every 6 (six) hours as needed for shortness of breath or wheezing.   amLODipine  (NORVASC ) 10 MG tablet Take 1 tablet (10 mg total) by mouth daily.   apixaban  (ELIQUIS ) 5 MG TABS tablet Take 1 tablet by mouth twice daily   aspirin  EC 81 MG tablet Take 1 tablet (81 mg total) by mouth daily. Swallow whole.   clopidogrel  (PLAVIX ) 75 MG tablet Take 75 mg by mouth daily.   estradiol  (ESTRACE ) 0.1 MG/GM vaginal cream Place 0.5 g vaginally 2 (two) times a week. Place 0.5g nightly for two weeks  then twice a week after   Evolocumab  (REPATHA  SURECLICK) 140 MG/ML SOAJ Inject 140 mg into the skin every 14 (fourteen) days.   ezetimibe  (ZETIA ) 10 MG tablet Take 1 tablet (10 mg total) by mouth daily.   furosemide  (LASIX ) 20 MG tablet Take 1 tablet by mouth once daily   gabapentin  (NEURONTIN ) 600 MG tablet Take 600 mg by mouth at bedtime.   isosorbide  mononitrate (IMDUR ) 30 MG 24 hr tablet Take 1 tablet (30 mg total) by mouth daily.   ketorolac  (TORADOL ) 10 MG tablet Take 10 mg by mouth 3 (three) times daily.   metFORMIN  (GLUCOPHAGE ) 1000 MG tablet Take 1,000 mg by mouth 2 (two) times daily.   metoprolol  succinate (TOPROL -XL) 50 MG 24 hr tablet Take 50 mg by mouth daily.   Multiple Vitamin (MULTIVITAMIN) capsule Take 1 capsule by mouth daily.   nitroGLYCERIN  (NITROSTAT ) 0.4 MG SL tablet Place 1 tablet (0.4 mg total) under the tongue every 5 (five) minutes as needed for chest pain.   olmesartan  (BENICAR ) 20 MG tablet Take 20 mg by mouth daily.   oxyCODONE  (OXY IR/ROXICODONE ) 5 MG immediate release tablet Take 5 mg by mouth 4 (four) times daily as needed.   potassium chloride  SA (KLOR-CON  M) 20 MEQ tablet Take 1 tablet (20 mEq total) by mouth daily.   rosuvastatin  (CRESTOR ) 20 MG tablet Take 1 tablet (20 mg total) by mouth daily.   rosuvastatin  (CRESTOR ) 5 MG tablet Take 5 mg by mouth at bedtime.    Allergies: Allergies as of 09/16/2023   (No Known Allergies)   Past Medical History:  Diagnosis Date   Arthritis    CAD (coronary artery disease) 11/1999   RCA stent X3, patent '06. Nuc low risk 9/13   Cataract    beginning stages   Diabetes mellitus without complication (HCC)    Dysrhythmia    History of stress test 01/08/2012   Showed minimal anterior thinning not felt to be significant.   Hx of echocardiogram 12/12/2011   EF>55%   Hypercholesteremia    Hypertension    Myocardial infarction [facility])    18 years ago    age 67   PAF (paroxysmal atrial fibrillation) (HCC) 11/2011   SSS  component with some bradycardia   Stroke (HCC) 1998   Vitamin D deficiency    Family History:  Mother - Uterine cancer Sister - T2DM, CAD  Social History:  Lives at home with her husband. Previously smoked 1/2 pack a day for about 60 years. Stopped smoking about 6 months ago. Denies alcohol or drug use. Reports that she is independent with most of her ADLs.   Review of Systems:  A complete ROS was negative except as per HPI.   Physical Exam: Blood pressure (!) 161/104, pulse 63, temperature (!) 97.2 F (36.2 C), temperature source Axillary, resp. rate (!) 27, height 5\' 6"  (1.676 m), weight 79.4 kg, SpO2 100%. General: Well-appearing, sitting up on side of the bed in NAD, intermittently spitting up into bag  HEENT: Normocephalic, atraumatic. Mucous membranes moist.  CV: Regular rate, regular rhythm. Normal S1/S2. No murmurs, rubs, gallops. No chest wall tenderness to palpation. Pulm: Lungs clear to auscultation bilaterally. No wheezing or increased WOB on room air.  Abd: Soft, non-distended, non-tender in all quadrants. Normoactive bowel sounds.  MSK: No midline spinal tenderness, step-offs, or deformities. Bilateral paraspinal tenderness to palpation.  Ext: Moving all extremities equally. No edema or tenderness.  Skin: Warm and dry. No rashes.  Neuro: Alert and grossly oriented. No gross neurologic deficits.   Labs: K+ > 7.5, Glucose 126, BUN 68, Cr 2.98, Calcium  10.4 BNP 170.8, hsTrop 18, Hgb 11.4,   EKG: personally reviewed, my interpretation is normal sinus rhythm without ischemic changes.    CXR: personally reviewed, my interpretation is clear lungs with normal mediastinal contours.   Assessment & Plan by Problem: Principal Problem:   Hyperkalemia Active Problems:   AKI (acute kidney injury) (HCC)  Jalynne Persico is a 78 y.o. female with a past medical history of T2DM, HLD, HTN, CAD w/MI s/p stent placement x3, CVA, and stage 3a CKD who presented after acute onset of chest  pain and shortness of breath with several days of low back pain, cleared from a cardiology perspective now admitted for AKI with associated hyperkalemia.   Hyperkalemia  Patient found to be hyperkalemic to > 7.5. Suspect multifactorial given history of documented CKD 3a, combination of several renally toxic medications in home regimen including Lasix , Metformin , daily oxycodone , olmesartan , potassium, and Ketorolac , as well as decreased food/fluid intake over the last week. She is now s/p Lokelma, 1L bolus, dextrose , insulin , IV calcium  gluconate in the ED. EKG x 2 reviewed and no changes noted. Repeat BMP on admission demonstrated persistent K > 7.5. Will re-check labs in 2 hours and consider nephrology consult if elevated despite multiple attempts to temporize.  - Give additional Lokelma and 500 mL bolus - BMP and EKG in 2h, then likely q4 BMP until K+ stabilizes - HOLD nephrotoxic meds (Lasix , Metformin , oxycodone , olmesartan , K+, Ketorolac ) - Continuous telemetry  - Encourage oral fluid intake as tolerated   AKI  Stage 3a CKD  Non-anion gap metabolic acidosis  Hx urinary retention  Cr. 2.98 (increased from baseline 1.5 in 05/2023). Likely a combination of prerenal and intrarenal etiology given reduced PO intake over the last week and renally toxic medications as described above. BUN/Cr ratio of 23 supports potential prerenal etiology as well. Low suspicion for postrenal etiology given no changes in urinating per patient report, however patient has been seen for urinary retention earlier this year due to concerns of abnormal bladder compliance, intermittent incomplete bladder emptying, and neurogenic bladder iso history of CVA. Thus, will monitor closely for retention and maintain and low threshold to obtain bladder scans. Will obtain urine lytes given non-anion gap metabolic acidosis noted on labs.  - Follow up UA, urine Na/Cl/K - Serial BMPs as described above  - Holding renally toxic meds as  described above   Bilateral low back pain  Suspect musculoskeletal etiology given tenderness to palpation is localized to the bilateral paraspinal region. No red flag symptoms such as nighttime awakenings, bowel  or bladder incontinence, or midline spinal tenderness. Low concern for nephrolithiasis given dull nature of pain and lack of urinary sx, however difficult to assess CVA tenderness in the setting of ongoing paraspinal tenderness. Will continue supportive care to manage pain while awaiting CT renal stone study ordered in the ED.  - Follow up CT renal stone study - Pain management: Tylenol  1000 mg q8h PRN, lidocaine  patches, gabapentin  800 mg nightly   Chest pain, resolved  Hx CAD s/p PCI RCA (2003, 2023) Initial trop 18, repeat stable at 17. EKG without ischemic changes. Cardiology was consulted in the ED, who supported low suspicion for ACS given negative delta trop. At the time of admission, patient's chest pain had resolved, however will continue to monitor closely while on telemetry.  - Cardiology consulted in the ED, appreciate recommendations  - Telemetry as described above - Continue home aspirin  81 mg - Continue home Plavix  75 mg   Atrial fibrillation  Regular rate and rhythm appreciated on EKG x 2 and physical examination. Will continue home Eliquis  and monitor via tele as described above.  - Continue home Eliquis  5 mg BID - Continue home metoprolol  50 mg  T2DM Glucose mildly elevated to 126 on admission. Takes Metformin  at home but does not use insulin .  - Continue to monitor glucose on routine BMPs - HOLD home Metformin    Hypertension  - Continue home amlodipine  10 mg  - Continue home Imdur  30 mg   Hyperlipidemia  - Continue home rosuvastatin  25 mg   Bowel regimen: Miralax  1 packet daily, senna nightly  Diet: Heart healthy  IVF: Bolus as described above.  VTE: home Eliquis   Code: DNR/DNI  Dispo: Admit patient to Observation with expected length of stay less than  2 midnights.  Signed:  Harlie Lieu, MS4  Attestation for Student Documentation:  I personally was present and re-performed the history, physical exam and medical decision-making activities of this service and have verified that the service and findings are accurately documented in the student's note.  Carleen Chary, DO 09/16/2023, 6:16 PM

## 2023-09-16 NOTE — Consult Note (Addendum)
 Cardiology Consultation   Patient ID: Debbie [name] MRN: [id number]; DOB: 09/25/1945  Admit date: 09/16/2023 Date of Consult: 09/16/2023  PCP:  [name], MD   [facility] Providers Cardiologist:  [name], MD   Patient Profile: Debbie [name] is a 78 y.o. female with a hx of CAD s/p DES x 3 to the RCA in 2003, s/p DES to o-pRCA and PTCA-mRCA (ISR) in 07/2021, paroxysmal atrial fibrillation on Eliquis , hypertension, hyperlipidemia, stroke, type 2 diabetes and chronic lower back pain  who is being seen 09/16/2023 for the evaluation of chest pain at the request of Dr. Colonel Dears.  History of Present Illness: [name] has previous history of RCA disease with stent placement x 3 in 2003.  She was hospitalized for chest pain in April 2023.  She had cardiac catheterization demonstrating 80% proximal RCA stenosis between the previously placed proximal, mid RCA stent.  The proximal and distal stents were patent.  Underwent successful PCI to the RCA with DES/PTCA of the mid RCA stent.  Seen again December 2024 for chest pain, mildly elevated troponin.  Discharged without further workup, discharged on Imdur .  Seen in follow-up and Imdur  was increased to 30 mg.  At follow-up with Dr. Loetta Ringer in February she had no chest pain and doing well.  Follows with pharmacy team to help manage her cholesterol.  Currently patient being evaluated for chest pain and back pain.  First troponin 18 and chronically has elevation, this is actually less than her baseline.  She is kind of a poor historian.  We discussed her chest pain extensively but then at the very end of the interview she actually describes that her back pain is her primary issue and that she sustained a fall 3 weeks ago that precipitated her current presentation.  Reports also fall 3 months ago and needing stitches for her head but I do not see any records of this.  She does not remember the exact events on why she fell and not able to provide much  detail of anything.  But she says she fell somehow and hit her back.  Since then she has had back pain that radiates to the front side of her chest.  Has pain only when she is moving.  Pain occurs almost nonstop.  No radiating pain to her arms.  No accompanying nausea, vomiting, diaphoresis.  Reports mild shortness of breath with exertion.  3 out of 10 pain.  Somewhat reproducible to palpation.   Otherwise patient has had no significant issues of chest pain since her hospitalization in 2023.  Patient denying any other complaints such as peripheral edema, syncope, palpitations, orthopnea.  Lives at home with her husband, reports being somewhat functional and able to go to her mailbox and back.  Chest x-ray negative.  Hemoglobin 11.4.  BNP 170.  BMP is pending second troponin pending.   Past Medical History:  Diagnosis Date   Arthritis    CAD (coronary artery disease) 11/1999   RCA stent X3, patent '06. Nuc low risk 9/13   Cataract    beginning stages   Diabetes mellitus without complication (HCC)    Dysrhythmia    History of stress test 01/08/2012   Showed minimal anterior thinning not felt to be significant.   Hx of echocardiogram 12/12/2011   EF>55%   Hypercholesteremia    Hypertension    Myocardial infarction (HCC)    18 years ago    age 13   PAF (paroxysmal atrial fibrillation) (HCC)  11/2011   SSS component with some bradycardia   Stroke (HCC) 1998   Vitamin D deficiency     Past Surgical History:  Procedure Laterality Date   ABDOMINAL HYSTERECTOMY  2021   CATARACT EXTRACTION, BILATERAL     CHOLECYSTECTOMY     COLONOSCOPY     CORONARY ANGIOGRAM  08/2004   patent stents   CORONARY ANGIOPLASTY WITH STENT PLACEMENT  11/1999   X 3   CORONARY STENT INTERVENTION N/A 07/31/2021   Procedure: CORONARY STENT INTERVENTION;  Surgeon: Millicent Ally, MD;  Location: [facility];  Service: Cardiovascular;  Laterality: N/A;   EYE SURGERY     LEFT HEART CATH AND CORONARY  ANGIOGRAPHY N/A 07/31/2021   Procedure: LEFT HEART CATH AND CORONARY ANGIOGRAPHY;  Surgeon: Millicent Ally, MD;  Location: [facility];  Service: Cardiovascular;  Laterality: N/A;   MULTIPLE TOOTH EXTRACTIONS     ROBOTIC ASSISTED TOTAL HYSTERECTOMY WITH BILATERAL SALPINGO OOPHERECTOMY Bilateral 03/29/2020   Procedure: XI ROBOTIC ASSISTED TOTAL HYSTERECTOMY WITH BILATERAL SALPINGO OOPHORECTOMY;  Surgeon: Arlee Lace, MD;  Location: [facility] OR;  Service: Gynecology;  Laterality: Bilateral;  Tracie to RNFA confirmed on 02/16/20 CS     Scheduled Meds:  Continuous Infusions:  PRN Meds:   Allergies:   No Known Allergies  Social History:   Social History   Socioeconomic History   Marital status: Married    Spouse name: Debbie [name]   Number of children: 1   Years of education: 13   Highest education level: Not on file  Occupational History   Occupation: Retired    Comment: Engineer, manufacturing at [employer]  Tobacco Use   Smoking status: Some Days    Types: Cigarettes   Smokeless tobacco: Never   Tobacco comments:    2-3 cigarettes per day.  Vaping Use   Vaping status: Never Used  Substance and Sexual Activity   Alcohol use: Never   Drug use: No   Sexual activity: Not Currently    Partners: Male  Other Topics Concern   Not on file  Social History Narrative   Right handed   Lives with husband   Caffeine use: Tea twice per week   Social Drivers of Health   Financial Resource Strain: Low Risk  (07/10/2021)   Overall Financial Resource Strain (CARDIA)    Difficulty of Paying Living Expenses: Not hard at all  Food Insecurity: No Food Insecurity (07/10/2021)   Hunger Vital Sign    Worried About Running Out of Food in the Last Year: Never true    Ran Out of Food in the Last Year: Never true  Transportation Needs: No Transportation Needs (07/10/2021)   PRAPARE - Administrator, Civil Service (Medical): No    Lack of Transportation (Non-Medical): No  Physical Activity: Not  on file  Stress: Not on file  Social Connections: Not on file  Intimate Partner Violence: Not on file    Family History:   Family History  Problem Relation Age of Onset   Uterine cancer Mother    Diabetes type II Sister    Heart disease Sister    Colon cancer Neg Hx    Colon polyps Neg Hx    Esophageal cancer Neg Hx    Rectal cancer Neg Hx    Stomach cancer Neg Hx    Breast cancer Neg Hx    Bladder Cancer Neg Hx      ROS:  Please see the history of present  illness.  All other ROS reviewed and negative.     Physical Exam/Data: Vitals:   09/16/23 1145 09/16/23 1215 09/16/23 1230 09/16/23 1245  BP:      Pulse: (!) 55 (!) 115 66 63  Resp: 18 13 (!) 21 19  Temp:      TempSrc:      SpO2: 100% 98% 100% 100%  Weight:      Height:       No intake or output data in the 24 hours ending 09/16/23 1256    09/16/2023    8:38 AM 06/10/2023    8:09 AM 06/03/2023    8:11 AM  Last 3 Weights  Weight (lbs) 175 lb 177 lb 174 lb  Weight (kg) 79.379 kg 80.287 kg 78.926 kg     Body mass index is 28.25 kg/m.  General:  Well nourished, well developed, in no acute distress HEENT: normal Neck: no JVD Vascular: No carotid bruits; Distal pulses 2+ bilaterally Cardiac:  normal S1, S2; RRR; 2/6 murmur LSB Lungs:  clear to auscultation bilaterally, no wheezing, rhonchi or rales  Abd: soft, nontender, no hepatomegaly  Ext: no edema Musculoskeletal:  No deformities, BUE and BLE strength normal and equal Skin: warm and dry  Neuro:  CNs 2-12 intact, no focal abnormalities noted Psych:  Normal affect   EKG:  The EKG was personally reviewed and demonstrates: Sinus rhythm heart rate 64.  No acute ST-T wave changes.  Some ST sloping inferiorly.  No significant changes from February. Telemetry:  Telemetry was personally reviewed and demonstrates: Sinus arrhythmia heart rate in the 60s Relevant CV Studies: Left heart catheterization 07/31/2021  Mid RCA lesion is 50% stenosed.   Prox RCA lesion is  80% stenosed.   Previously placed Ost RCA to Prox RCA stent (unknown type) is  widely patent.   Previously placed Dist RCA stent (unknown type) is  widely patent.   A drug-eluting stent was successfully placed.   Post intervention, there is a 0% residual stenosis.   Post intervention, there is a 0% residual stenosis.   Post intervention, there is a 0% residual stenosis.   Normal normal LAD and left circumflex coronary arteries.   The right coronary artery had a previously placed patent proximal stent, 80% stenosis between the proximal and mid stent with 50% in-stent narrowing in the mid stent and a patent distal third stent.   LVEDP 17 mmHg.   Successful percutaneous coronary intervention to the RCA with insertion of a 2.5 x 18 mm Medtronic Onyx Frontier stent was dilated to 2.76 mm and PTCA of the mid in-stent 50 to 60% stenosis.   RECOMMENDATION: Recommend resumption of Eliquis  commencing tomorrow with initial triple drug therapy for 1 month followed by Plavix /Eliquis .  The patient presented with significant hypertension and will need optimal blood pressure control.  Aggressive lipid-lowering therapy with target LDL less than 70.   Echocardiogram 07/09/2021 1. Left ventricular ejection fraction, by estimation, is 55 to 60%. The  left ventricle has normal function. The left ventricle has no regional  wall motion abnormalities. Left ventricular diastolic function could not  be evaluated.   2. Right ventricular systolic function is normal. The right ventricular  size is normal.   3. The mitral valve is normal in structure. Trivial mitral valve  regurgitation. No evidence of mitral stenosis.   4. The aortic valve is tricuspid. Aortic valve regurgitation is not  visualized. No aortic stenosis is present.   5. The inferior vena cava is normal  in size with greater than 50%  respiratory variability, suggesting right atrial pressure of 3 mmHg.   Comparison(s): No prior Echocardiogram.     Laboratory Data: High Sensitivity Troponin:   Recent Labs  Lab 09/16/23 0918  TROPONINIHS 18*     ChemistryNo results for input(s): "NA", "K", "CL", "CO2", "GLUCOSE", "BUN", "CREATININE", "CALCIUM ", "MG", "GFRNONAA", "GFRAA", "ANIONGAP" in the last 168 hours.  No results for input(s): "PROT", "ALBUMIN ", "AST", "ALT", "ALKPHOS", "BILITOT" in the last 168 hours. Lipids No results for input(s): "CHOL", "TRIG", "HDL", "LABVLDL", "LDLCALC", "CHOLHDL" in the last 168 hours.  Hematology Recent Labs  Lab 09/16/23 0918  WBC 10.4  RBC 3.65*  HGB 11.4*  HCT 35.8*  MCV 98.1  MCH 31.2  MCHC 31.8  RDW 12.7  PLT 306   Thyroid  No results for input(s): "TSH", "FREET4" in the last 168 hours.  BNP Recent Labs  Lab 09/16/23 0918  BNP 170.8*    DDimer No results for input(s): "DDIMER" in the last 168 hours.  Radiology/Studies:  DG Chest Portable 1 View Result Date: 09/16/2023 CLINICAL DATA:  Chest pain EXAM: PORTABLE CHEST 1 VIEW COMPARISON:  March 18, 2023 FINDINGS: The heart size and mediastinal contours are within normal limits. Both lungs are clear. The visualized skeletal structures are unremarkable. IMPRESSION: No active disease. Electronically Signed   By: Fredrich Jefferson M.D.   On: 09/16/2023 09:13     Assessment and Plan:  Chest pain/back pain CAD with DES to RCA x 3 Patient actually reports her primary complaint is back pain with radiation to her chest.  Reports sustaining a fall 3 weeks ago which was the inciting event for her symptoms today.  She reports exertional symptoms which could be MSK given fall. EKG with no acute ST-T wave changes.  Troponins are chronically elevated and first troponin minimally elevated 18.  She has previous cardiac catheterization in April 2023 where she had in-stent restenosis of her mid RCA stent which was stented again and had PTCA.  Patent stents in the proximal/distal segments.  No other disease in other major vessels.  I think with a clear  inciting event and overall negative workup would continue to manage her back pain and continue to treat her medically before considering cardiac catheterization again.  Can reevaluate after back pain issues resolve. First troponin 18, waiting for second troponin.  Will check a delta if not significantly changed likely can discharge.  We can follow her outpatient to talk about further workup if still indicated. At home she still appears to be on triple therapy with aspirin , Eliquis , Plavix .   Defer to MD whether Plavix  is still needed this far out but with ISR.  Prior notes had recommended discontinuation but this continues to remain on her med list. Also on rosuvastatin  20 mg, Repatha  and followed by pharmacy, Zetia , Imdur  30 mg, Toprol -XL 100 mg.  May consider uptitrating rosuvastatin  to 40 mg if she tolerates.   LDL 147 3 months ago.  Update lipid panel at follow-up.  PAF History of CVA I do not see any recurrences of this since 2013.  She is in sinus today. Continue with Eliquis  and Toprol -XL  Type 2 diabetes A1c 6.4% 1 year ago  Hypertension Blood pressure in the 140s to 150s here.  But with some extraneous readings. Continue with amlodipine  10 mg, olmesartan  40 mg.  And the above medications. Keep blood pressure log and bring to upcoming appointment  Will arrange follow-up.  Still waiting on BMP.  Risk Assessment/Risk  Scores:  CHA2DS2-VASc Score = 8   This indicates a 10.8% annual risk of stroke. The patient's score is based upon: CHF History: 0 HTN History: 1 Diabetes History: 1 Stroke History: 2 Vascular Disease History: 1 Age Score: 2 Gender Score: 1     For questions or updates, please contact [facility] Please consult [email or web address] for contact info under    Signed, Burnetta Cart, PA-C  09/16/2023 12:56 PM

## 2023-09-16 NOTE — ED Notes (Signed)
 Call attempted to floor, no response. PT transported with nebulizer.

## 2023-09-16 NOTE — ED Notes (Signed)
 Pt ambulated to the bathroom with me on standby and tolerated it well.

## 2023-09-17 ENCOUNTER — Other Ambulatory Visit: Payer: Self-pay

## 2023-09-17 DIAGNOSIS — E860 Dehydration: Secondary | ICD-10-CM | POA: Diagnosis present

## 2023-09-17 DIAGNOSIS — Z79899 Other long term (current) drug therapy: Secondary | ICD-10-CM | POA: Diagnosis not present

## 2023-09-17 DIAGNOSIS — Z8049 Family history of malignant neoplasm of other genital organs: Secondary | ICD-10-CM | POA: Diagnosis not present

## 2023-09-17 DIAGNOSIS — I252 Old myocardial infarction: Secondary | ICD-10-CM | POA: Diagnosis not present

## 2023-09-17 DIAGNOSIS — Z90710 Acquired absence of both cervix and uterus: Secondary | ICD-10-CM | POA: Diagnosis not present

## 2023-09-17 DIAGNOSIS — F17210 Nicotine dependence, cigarettes, uncomplicated: Secondary | ICD-10-CM | POA: Diagnosis present

## 2023-09-17 DIAGNOSIS — I2510 Atherosclerotic heart disease of native coronary artery without angina pectoris: Secondary | ICD-10-CM | POA: Diagnosis present

## 2023-09-17 DIAGNOSIS — Z8673 Personal history of transient ischemic attack (TIA), and cerebral infarction without residual deficits: Secondary | ICD-10-CM | POA: Diagnosis not present

## 2023-09-17 DIAGNOSIS — Z955 Presence of coronary angioplasty implant and graft: Secondary | ICD-10-CM | POA: Diagnosis not present

## 2023-09-17 DIAGNOSIS — N1831 Chronic kidney disease, stage 3a: Secondary | ICD-10-CM | POA: Diagnosis present

## 2023-09-17 DIAGNOSIS — E872 Acidosis, unspecified: Secondary | ICD-10-CM | POA: Diagnosis present

## 2023-09-17 DIAGNOSIS — N179 Acute kidney failure, unspecified: Secondary | ICD-10-CM | POA: Diagnosis present

## 2023-09-17 DIAGNOSIS — I480 Paroxysmal atrial fibrillation: Secondary | ICD-10-CM | POA: Diagnosis present

## 2023-09-17 DIAGNOSIS — E1122 Type 2 diabetes mellitus with diabetic chronic kidney disease: Secondary | ICD-10-CM | POA: Diagnosis present

## 2023-09-17 DIAGNOSIS — Z833 Family history of diabetes mellitus: Secondary | ICD-10-CM | POA: Diagnosis not present

## 2023-09-17 DIAGNOSIS — E875 Hyperkalemia: Secondary | ICD-10-CM | POA: Diagnosis present

## 2023-09-17 DIAGNOSIS — I129 Hypertensive chronic kidney disease with stage 1 through stage 4 chronic kidney disease, or unspecified chronic kidney disease: Secondary | ICD-10-CM | POA: Diagnosis present

## 2023-09-17 DIAGNOSIS — Z7984 Long term (current) use of oral hypoglycemic drugs: Secondary | ICD-10-CM | POA: Diagnosis not present

## 2023-09-17 DIAGNOSIS — Z7901 Long term (current) use of anticoagulants: Secondary | ICD-10-CM | POA: Diagnosis not present

## 2023-09-17 DIAGNOSIS — E7800 Pure hypercholesterolemia, unspecified: Secondary | ICD-10-CM | POA: Diagnosis present

## 2023-09-17 DIAGNOSIS — Z8249 Family history of ischemic heart disease and other diseases of the circulatory system: Secondary | ICD-10-CM | POA: Diagnosis not present

## 2023-09-17 DIAGNOSIS — Z66 Do not resuscitate: Secondary | ICD-10-CM | POA: Diagnosis present

## 2023-09-17 DIAGNOSIS — Z7902 Long term (current) use of antithrombotics/antiplatelets: Secondary | ICD-10-CM | POA: Diagnosis not present

## 2023-09-17 DIAGNOSIS — Z7982 Long term (current) use of aspirin: Secondary | ICD-10-CM | POA: Diagnosis not present

## 2023-09-17 LAB — BASIC METABOLIC PANEL WITH GFR
Anion gap: 10 (ref 5–15)
Anion gap: 5 (ref 5–15)
Anion gap: 8 (ref 5–15)
Anion gap: 8 (ref 5–15)
BUN: 58 mg/dL — ABNORMAL HIGH (ref 8–23)
BUN: 56 mg/dL — ABNORMAL HIGH (ref 8–23)
BUN: 53 mg/dL — ABNORMAL HIGH (ref 8–23)
BUN: 51 mg/dL — ABNORMAL HIGH (ref 8–23)
CO2: 12 mmol/L — ABNORMAL LOW (ref 22–32)
CO2: 16 mmol/L — ABNORMAL LOW (ref 22–32)
CO2: 17 mmol/L — ABNORMAL LOW (ref 22–32)
CO2: 19 mmol/L — ABNORMAL LOW (ref 22–32)
Calcium: 9.8 mg/dL (ref 8.9–10.3)
Calcium: 9.8 mg/dL (ref 8.9–10.3)
Calcium: 9.7 mg/dL (ref 8.9–10.3)
Calcium: 9.1 mg/dL (ref 8.9–10.3)
Chloride: 111 mmol/L (ref 98–111)
Chloride: 113 mmol/L — ABNORMAL HIGH (ref 98–111)
Chloride: 111 mmol/L (ref 98–111)
Chloride: 110 mmol/L (ref 98–111)
Creatinine, Ser: 2.62 mg/dL — ABNORMAL HIGH (ref 0.44–1.00)
Creatinine, Ser: 2.46 mg/dL — ABNORMAL HIGH (ref 0.44–1.00)
Creatinine, Ser: 2.58 mg/dL — ABNORMAL HIGH (ref 0.44–1.00)
Creatinine, Ser: 2.15 mg/dL — ABNORMAL HIGH (ref 0.44–1.00)
GFR, Estimated: 18 mL/min — ABNORMAL LOW (ref >60)
GFR, Estimated: 19 mL/min — ABNORMAL LOW (ref >60)
GFR, Estimated: 18 mL/min — ABNORMAL LOW (ref >60)
GFR, Estimated: 23 mL/min — ABNORMAL LOW (ref >60)
Glucose, Bld: 91 mg/dL (ref 70–99)
Glucose, Bld: 90 mg/dL (ref 70–99)
Glucose, Bld: 88 mg/dL (ref 70–99)
Glucose, Bld: 133 mg/dL — ABNORMAL HIGH (ref 70–99)
Potassium: 6.8 mmol/L (ref 3.5–5.1)
Potassium: 5.6 mmol/L — ABNORMAL HIGH (ref 3.5–5.1)
Potassium: 5.3 mmol/L — ABNORMAL HIGH (ref 3.5–5.1)
Potassium: 4.8 mmol/L (ref 3.5–5.1)
Sodium: 133 mmol/L — ABNORMAL LOW (ref 135–145)
Sodium: 134 mmol/L — ABNORMAL LOW (ref 135–145)
Sodium: 136 mmol/L (ref 135–145)
Sodium: 137 mmol/L (ref 135–145)

## 2023-09-17 LAB — GLUCOSE, CAPILLARY: Glucose-Capillary: 93 mg/dL (ref 70–99)

## 2023-09-17 MED ORDER — SODIUM CHLORIDE 0.9% FLUSH: 10.0000 mL | INTRAVENOUS | Status: DC | PRN

## 2023-09-17 MED ORDER — SODIUM ZIRCONIUM CYCLOSILICATE 10 G PO PACK
10.0000 g | PACK | Freq: Three times a day (TID) | ORAL | Status: DC
  Administered 2023-09-17 (×3): 10 g via ORAL
  Filled 2023-09-17 (×3): qty 1

## 2023-09-17 MED ORDER — SODIUM BICARBONATE 8.4 % IV SOLN: 50.0000 meq | Freq: Once | INTRAVENOUS | Status: DC

## 2023-09-17 MED ORDER — INSULIN ASPART 100 UNIT/ML IJ SOLN
10.0000 [IU] | Freq: Once | INTRAMUSCULAR | Status: AC
  Administered 2023-09-17: 10 [IU] via SUBCUTANEOUS

## 2023-09-17 MED ORDER — SODIUM ZIRCONIUM CYCLOSILICATE 10 G PO PACK: 10.0000 g | PACK | Freq: Two times a day (BID) | ORAL | Status: DC

## 2023-09-17 MED ORDER — SODIUM CHLORIDE 0.9 % IV BOLUS
1000.0000 mL | Freq: Once | INTRAVENOUS | Status: AC
  Administered 2023-09-17: 1000 mL via INTRAVENOUS

## 2023-09-17 MED ORDER — DEXTROSE 50 % IV SOLN
1.0000 | Freq: Once | INTRAVENOUS | Status: AC
  Administered 2023-09-17: 50 mL via INTRAVENOUS
  Filled 2023-09-17: qty 50

## 2023-09-17 MED ORDER — SODIUM ZIRCONIUM CYCLOSILICATE 10 G PO PACK
10.0000 g | PACK | Freq: Once | ORAL | Status: AC
  Administered 2023-09-17: 10 g via ORAL
  Filled 2023-09-17: qty 1

## 2023-09-17 MED ORDER — STERILE WATER FOR INJECTION IV SOLN
INTRAVENOUS | Status: DC
  Filled 2023-09-17 (×3): qty 1000

## 2023-09-17 MED ORDER — STERILE WATER FOR INJECTION IV SOLN
INTRAVENOUS | Status: DC
  Filled 2023-09-17: qty 150

## 2023-09-17 MED ORDER — CALCIUM GLUCONATE-NACL 1-0.675 GM/50ML-% IV SOLN
1.0000 g | Freq: Once | INTRAVENOUS | Status: AC
  Administered 2023-09-17: 1000 mg via INTRAVENOUS
  Filled 2023-09-17: qty 50

## 2023-09-17 MED ORDER — DEXTROSE 10 % IV SOLN: INTRAVENOUS | Status: AC

## 2023-09-17 MED ORDER — SODIUM BICARBONATE 8.4 % IV SOLN
50.0000 meq | Freq: Once | INTRAVENOUS | Status: AC
  Administered 2023-09-17: 50 meq via INTRAVENOUS
  Filled 2023-09-17: qty 50

## 2023-09-17 MED ORDER — CHLORHEXIDINE GLUCONATE CLOTH 2 % EX PADS
6.0000 | MEDICATED_PAD | Freq: Every day | CUTANEOUS | Status: DC
  Administered 2023-09-17 – 2023-09-18 (×2): 6 via TOPICAL

## 2023-09-17 NOTE — Care Management Obs Status (Signed)
 MEDICARE OBSERVATION STATUS NOTIFICATION   Patient Details  Name: Debbie [name] MRN: [id number] Date of Birth: [birth year]   Medicare Observation Status Notification Given:     Moon/Obs letter sign and copy given  Wynonia Hedges 09/17/2023, 10:52 AM

## 2023-09-17 NOTE — Progress Notes (Signed)
 Subjective:  Patient Summary: Debbie [name] is a 78 y.o. female with a past medical history of T2DM, HLD, HTN, CAD w/MI s/p stent placement x3, CVA, and stage 3a CKD who presented after acute onset of chest pain and shortness of breath with several days of low back pain, cleared from a cardiology perspective now admitted for AKI with associated hyperkalemia.   Overnight Events/Interim History: Patient reports that she is feeling much better today. Denies continued nausea, emesis, or any abdominal discomfort. Notes that she has been eating and drinking very well with good appetite. Reports that her back pain has resolved and she no longer has any pain at rest or with movement. Denies continued chest pain, shortness of breath, dizziness, or lightheadedness. She is urinating at her baseline with no pain.   Obtained updated history from the patient now that she is feeling better. Notes that she had approximately 1 episode of diarrhea per day over the last 10 days, but was still eating and drinking at her baseline. She ate slightly less than usual over the last day or two prior to coming into the hospital.   Significant Hospital Events: N/A  Objective:  Vital signs in last 24 hours: Vitals:   09/16/23 2024 09/17/23 0034 09/17/23 0347 09/17/23 0819  BP: (!) 130/49 (!) 130/59 (!) 149/49 (!) 110/40  Pulse: 75 (!) 56 (!) 57 (!) 48  Resp: 19 18 18    Temp: 98.3 F (36.8 C) 98.3 F (36.8 C) 99.2 F (37.3 C) (!) 97.3 F (36.3 C)  TempSrc:  Oral Oral Oral  SpO2: 99% 99% 100%   Weight:      Height:       Weight change:   Intake/Output Summary (Last 24 hours) at 09/17/2023 1328 Last data filed at 09/17/2023 6295 Gross per 24 hour  Intake 576.47 ml  Output --  Net 576.47 ml   Physical Exam -  General: Well-appearing, laying comfortably in NAD HEENT: Normocephalic, atraumatic. Mucous membranes moist.  CV: Regular rate, irregularly irregular rhythm. No murmurs, rubs, gallops. No chest wall  tenderness to palpation. Pulm: Lungs clear to auscultation bilaterally. No wheezing or increased WOB on room air.  Abd: Soft, non-distended, non-tender in all quadrants. Normoactive bowel sounds.  MSK: No paraspinal tenderness to palpation. Moving all extremities equally. No edema or tenderness.  Skin: Warm and dry. No rashes.  Neuro: Alert and grossly oriented. No gross neurologic deficits.  Assessment/Plan:  Principal Problem:   Hyperkalemia Active Problems:   AKI (acute kidney injury) (HCC)  Debbie [name] is a 78 y.o. female with a past medical history of T2DM, HLD, HTN, CAD w/MI s/p stent placement x3, CVA, and stage 3a CKD who presented after acute onset of chest pain and shortness of breath with several days of low back pain, cleared from a cardiology perspective now admitted for AKI with associated hyperkalemia.   Hyperkalemia  Suspect multifactorial given history of documented CKD 3a, combination of several renally toxic medications in home regimen including Lasix , Metformin , daily oxycodone , olmesartan , potassium, and Ketorolac , as well as mild dehydration. K+ initially decreased to 5.8 overnight however persistently elevated to 6.8 this AM despite several rounds of temporizing measures including Lokelma/calcium  gluconate/insulin /albuterol/IVF. Will consult nephrology given persistent hyperkalemia and continue serial BMPs.  - Lokelma TID, additional temporizing measures as needed  - 1 amp bicarb, additional bicarb infusion  - HOLD nephrotoxic meds (Lasix , Metformin , oxycodone , olmesartan , K+, Ketorolac ) - Continuous telemetry  - Encourage oral fluid intake as tolerated  AKI  Stage 3a CKD  Non-anion gap metabolic acidosis  Hx urinary retention  Likely a combination of prerenal and intrarenal etiology given reduced PO intake and renally toxic medications as described above. Cr improved somewhat to 2.62 this morning with IV hydration. Urine AG 22 with appropriate K, suggestive of  extrarenal cause. Difficult to assess for RTA in the setting of active AKI, however will continue to consider. No need for serologies or biopsy per Nephrology recommendations. Her metabolic acidosis is continuing to improve with bicarb as described above.  - Nephrology consulted, appreciate recommendations  - Serial BMPs as described above  - Holding renally toxic meds as described above  - Strict I&Os  Bilateral low back pain, resolved  Suspect musculoskeletal etiology given tenderness to palpation was previously localized to the bilateral paraspinal region. CT renal stone study was unremarkable without evidence of nephrolithiasis. Patient reports that her pain has resolved this afternoon, however will continue to monitor.  - Pain management: Tylenol  1000 mg q8h PRN, lidocaine  patches, gabapentin  800 mg nightly    Chest pain, resolved  Hx CAD s/p PCI RCA (2003, 2023) EKG without ischemic changes and negative trop x 2. Cardiology was consulted in the ED, who supported low suspicion for ACS given negative delta trop.  - Telemetry as described above - Continue home Plavix  75 mg    Atrial fibrillation  Will continue home Eliquis  and monitor via tele as described above. Holding metoprolol  given soft blood pressure this morning.  - Continue home Eliquis  5 mg BID - DC home metoprolol  50 mg   T2DM Takes Metformin  at home but does not use insulin . Glucoses remain well-controlled here on serial BMPs.  - Continue to monitor glucose on routine BMPs - HOLD home Metformin     Hypertension  Initially normotensive, found to have soft BP to 110/40 without associated symptoms this morning. Will hold home antihypertensives at this time and continue to monitor.  - DC home amlodipine  10 mg  - DC home Imdur  30 mg    Hyperlipidemia  - Continue home rosuvastatin  25 mg    Bowel regimen: Miralax  1 packet daily, senna nightly  Diet: Heart healthy  IVF: Bolus as described above.  VTE: home Eliquis   Code:  DNR/DNI   LOS: 0 days   Debbie [name], Medical Student 09/17/2023, 1:28 PM

## 2023-09-17 NOTE — Progress Notes (Signed)
 IVT consult by primary RN for midline placement. Patient's GFR (<30) excludes her from being able to have ML placed. Secure chat sent to IM team to suggest PICC or central line due to IV needs and frequent lab draws to monitor potassium levels. Primary team is going to discuss during rounds and per Dr. Rozelle Corning, nephrology is ok with PICC placement per previous discussion. Patient currently has 1 PIV in place to be utilized until alternative access can be placed.   If PICC is to be ordered, please make note by nephrology that it is ok given her CKD staging and low GFR.   Primary nurse notified of plan. IM team will let IVT know of next steps.   Tonyia Marschall R Vali Capano, RN

## 2023-09-17 NOTE — Progress Notes (Signed)
 Potassium trend overnight >7.5 => 6.7 => 5.8 => 6.8.  Assessed at bedside, she is well-appearing.  She did not have any complaints except for weakness.  Her EKG shows a sinus rhythm with some PACs but no QRS complex or T wave changes.  Repeating treatment with 10 units insulin  and an ampule of D50 with a brief D10 infusion to follow.  CBG now is 93, down from 173 at 8 PM.  She has a little bit of cardiac ectopy with PACs, will give her some more calcium  gluconate as well.  A dose of Lokelma was ordered which she has not yet received, we will go ahead and give this as well.  Adria Hopkins MD 09/17/2023, 4:05 AM

## 2023-09-17 NOTE — Progress Notes (Signed)
 Peripherally Inserted Central Catheter Placement  The IV Nurse has discussed with the patient and/or persons authorized to consent for the patient, the purpose of this procedure and the potential benefits and risks involved with this procedure.  The benefits include less needle sticks, lab draws from the catheter, and the patient may be discharged home with the catheter. Risks include, but not limited to, infection, bleeding, blood clot (thrombus formation), and puncture of an artery; nerve damage and irregular heartbeat and possibility to perform a PICC exchange if needed/ordered by physician.  Alternatives to this procedure were also discussed.  Bard Power PICC patient education guide, fact sheet on infection prevention and patient information card has been provided to patient /or left at bedside.    PICC Placement Documentation  PICC Double Lumen 09/17/23 Right Brachial 38 cm 0 cm (Active)  Indication for Insertion or Continuance of Line Poor Vasculature-patient has had multiple peripheral attempts or PIVs lasting less than 24 hours 09/17/23 1158  Exposed Catheter (cm) 0 cm 09/17/23 1158  Site Assessment Clean, Dry, Intact 09/17/23 1158  Lumen #1 Status Flushed;Saline locked;Blood return noted 09/17/23 1158  Lumen #2 Status Flushed;Saline locked;Blood return noted 09/17/23 1158  Dressing Type Transparent;Securing device 09/17/23 1158  Dressing Status Antimicrobial disc/dressing in place;Clean, Dry, Intact 09/17/23 1158  Line Care Connections checked and tightened 09/17/23 1158  Line Adjustment (NICU/IV Team Only) No 09/17/23 1158  Dressing Intervention New dressing;Adhesive placed at insertion site (IV team only) 09/17/23 1158  Dressing Change Due 09/24/23 09/17/23 1158    OK with nephrology to place a PICC   Unk Garb 09/17/2023, 11:59 AM

## 2023-09-17 NOTE — Consult Note (Signed)
 [facility]  INPATIENT CONSULTATION  Reason for Consultation: AKI and hyperkalemia Requesting Provider: Dr. Jarvis Mesa  HPI: Debbie [name] is an 78 y.o. female with CAD s/p PCI, A fib, HTN, HL, DM, h/o CVA who is currently admitted for back pain and nephrology is consulted for AKI on CKD and hyperkalemia.   Pt presented through ED yesterday it appears for back pain and CP - Cardiology consulted and thought atypical CP, CT renal stone protocol no issue, CXR normal.  Labs with Na 135, K > 7.5, bicarb 15, BUN 68, Cr 3, gluc 104. UA neg protein and blood, did have many bacteria.  Home meds inc lasix  20 daily, KCl 20 dail, metformin  1 BID, olmesartan  20 daily, statin, recently rx'd ketorolac . She's received heavy med management of K.    This morning she is comfortable in bed with husband present.  She denies any myalgias, dysuria, hematuria, difficulty voiding. No tea colored urine, rashes, oral ulcers. She reports vertigo but no orthostatic symptoms.  Hasn't been checking home BPs recently.   PMH: Past Medical History:  Diagnosis Date   Arthritis    CAD (coronary artery disease) 11/1999   RCA stent X3, patent '06. Nuc low risk 9/13   Cataract    beginning stages   Diabetes mellitus without complication (HCC)    Dysrhythmia    History of stress test 01/08/2012   Showed minimal anterior thinning not felt to be significant.   Hx of echocardiogram 12/12/2011   EF>55%   Hypercholesteremia    Hypertension    Myocardial infarction [facility])    18 years ago    age 70   PAF (paroxysmal atrial fibrillation) (HCC) 11/2011   SSS component with some bradycardia   Stroke (HCC) 1998   Vitamin D deficiency    PSH: Past Surgical History:  Procedure Laterality Date   ABDOMINAL HYSTERECTOMY  2021   CATARACT EXTRACTION, BILATERAL     CHOLECYSTECTOMY     COLONOSCOPY     CORONARY ANGIOGRAM  08/2004   patent stents   CORONARY ANGIOPLASTY WITH STENT PLACEMENT  11/1999   X 3   CORONARY  STENT INTERVENTION N/A 07/31/2021   Procedure: CORONARY STENT INTERVENTION;  Surgeon: Millicent Ally, MD;  Location: [facility] INVASIVE CV LAB;  Service: Cardiovascular;  Laterality: N/A;   EYE SURGERY     HAMMER TOE SURGERY Left    L 2nd toe   LEFT HEART CATH AND CORONARY ANGIOGRAPHY N/A 07/31/2021   Procedure: LEFT HEART CATH AND CORONARY ANGIOGRAPHY;  Surgeon: Millicent Ally, MD;  Location: [facility] INVASIVE CV LAB;  Service: Cardiovascular;  Laterality: N/A;   MULTIPLE TOOTH EXTRACTIONS     ROBOTIC ASSISTED TOTAL HYSTERECTOMY WITH BILATERAL SALPINGO OOPHERECTOMY Bilateral 03/29/2020   Procedure: XI ROBOTIC ASSISTED TOTAL HYSTERECTOMY WITH BILATERAL SALPINGO OOPHORECTOMY;  Surgeon: Arlee Lace, MD;  Location: [facility] OR;  Service: Gynecology;  Laterality: Bilateral;  Tracie to RNFA confirmed on 02/16/20 CS    Past Medical History:  Diagnosis Date   Arthritis    CAD (coronary artery disease) 11/1999   RCA stent X3, patent '06. Nuc low risk 9/13   Cataract    beginning stages   Diabetes mellitus without complication (HCC)    Dysrhythmia    History of stress test 01/08/2012   Showed minimal anterior thinning not felt to be significant.   Hx of echocardiogram 12/12/2011   EF>55%   Hypercholesteremia    Hypertension    Myocardial infarction (HCC)    18 years  ago    age 59   PAF (paroxysmal atrial fibrillation) (HCC) 11/2011   SSS component with some bradycardia   Stroke (HCC) 1998   Vitamin D deficiency     Medications:  I have reviewed the patient's current medications.  Medications Prior to Admission  Medication Sig Dispense Refill   acetaminophen  (TYLENOL ) 500 MG tablet Take 2 tablets (1,000 mg total) by mouth every 8 (eight) hours as needed. (Patient taking differently: Take 1,000 mg by mouth every 8 (eight) hours as needed for moderate pain (pain score 4-6).) 30 tablet 0   albuterol (VENTOLIN HFA) 108 (90 Base) MCG/ACT inhaler Inhale 2 puffs into the lungs every 6 (six) hours as needed for  shortness of breath or wheezing.     amLODipine  (NORVASC ) 10 MG tablet Take 1 tablet (10 mg total) by mouth daily. 90 tablet 3   apixaban  (ELIQUIS ) 5 MG TABS tablet Take 1 tablet by mouth twice daily 60 tablet 5   aspirin  EC 81 MG tablet Take 1 tablet (81 mg total) by mouth daily. Swallow whole.     clopidogrel  (PLAVIX ) 75 MG tablet Take 75 mg by mouth daily.     estradiol  (ESTRACE ) 0.1 MG/GM vaginal cream Place 0.5 g vaginally 2 (two) times a week. Place 0.5g nightly for two weeks then twice a week after 30 g 3   Evolocumab  (REPATHA  SURECLICK) 140 MG/ML SOAJ Inject 140 mg into the skin every 14 (fourteen) days. 6 mL 3   ezetimibe  (ZETIA ) 10 MG tablet Take 1 tablet (10 mg total) by mouth daily. 90 tablet 3   furosemide  (LASIX ) 20 MG tablet Take 1 tablet by mouth once daily 90 tablet 3   gabapentin  (NEURONTIN ) 600 MG tablet Take 600 mg by mouth at bedtime.     isosorbide  mononitrate (IMDUR ) 30 MG 24 hr tablet Take 1 tablet (30 mg total) by mouth daily. 90 tablet 3   ketorolac  (TORADOL ) 10 MG tablet Take 10 mg by mouth 3 (three) times daily.     metFORMIN  (GLUCOPHAGE ) 1000 MG tablet Take 1,000 mg by mouth 2 (two) times daily.     metoprolol  succinate (TOPROL -XL) 50 MG 24 hr tablet Take 50 mg by mouth daily.     Multiple Vitamin (MULTIVITAMIN) capsule Take 1 capsule by mouth daily.     nitroGLYCERIN  (NITROSTAT ) 0.4 MG SL tablet Place 1 tablet (0.4 mg total) under the tongue every 5 (five) minutes as needed for chest pain. 25 tablet 3   olmesartan  (BENICAR ) 20 MG tablet Take 20 mg by mouth daily.     oxyCODONE  (OXY IR/ROXICODONE ) 5 MG immediate release tablet Take 5 mg by mouth 4 (four) times daily as needed.     potassium chloride  SA (KLOR-CON  M) 20 MEQ tablet Take 1 tablet (20 mEq total) by mouth daily. 90 tablet 3   rosuvastatin  (CRESTOR ) 20 MG tablet Take 1 tablet (20 mg total) by mouth daily. 90 tablet 1   rosuvastatin  (CRESTOR ) 5 MG tablet Take 5 mg by mouth at bedtime.      ALLERGIES:    Allergies  Allergen Reactions   Fish-Derived Products     FAM HX: Family History  Problem Relation Age of Onset   Uterine cancer Mother    Diabetes type II Sister    Heart disease Sister    Colon cancer Neg Hx    Colon polyps Neg Hx    Esophageal cancer Neg Hx    Rectal cancer Neg Hx    Stomach cancer Neg  Hx    Breast cancer Neg Hx    Bladder Cancer Neg Hx     Social History:   reports that she has been smoking cigarettes. She has never used smokeless tobacco. She reports that she does not drink alcohol and does not use drugs.  ROS: 12 system ROS neg except per HPI above  Blood pressure (!) 110/40, pulse (!) 48, temperature (!) 97.3 F (36.3 C), temperature source Oral, resp. rate 18, height 5\' 6"  (1.676 m), weight 79.4 kg, SpO2 100%. PHYSICAL EXAM: Gen: well appearing elderly woman  Eyes: anicteric, EOMI ENT:MMM Neck: supple, no JVD CV:  RRR Abd: soft Lungs: clear ant [id number] with clear yellow urine Extr:  no edema Neuro: some difficulty with medical details, nonfocal grossly Skin: no rashes   Results for orders placed or performed during the hospital encounter of 09/16/23 (from the past 48 hours)  CBC with Differential     Status: Abnormal   Collection Time: 09/16/23  9:18 AM  Result Value Ref Range   WBC 10.4 4.0 - 10.5 K/uL   RBC 3.65 (L) 3.87 - 5.11 MIL/uL   Hemoglobin 11.4 (L) 12.0 - 15.0 g/dL   HCT 09.8 (L) 11.9 - 14.7 %   MCV 98.1 80.0 - 100.0 fL   MCH 31.2 26.0 - 34.0 pg   MCHC 31.8 30.0 - 36.0 g/dL   RDW 82.9 56.2 - 13.0 %   Platelets 306 150 - 400 K/uL   nRBC 0.0 0.0 - 0.2 %   Neutrophils Relative % 75 %   Neutro Abs 7.8 (H) 1.7 - 7.7 K/uL   Lymphocytes Relative 20 %   Lymphs Abs 2.1 0.7 - 4.0 K/uL   Monocytes Relative 4 %   Monocytes Absolute 0.4 0.1 - 1.0 K/uL   Eosinophils Relative 1 %   Eosinophils Absolute 0.1 0.0 - 0.5 K/uL   Basophils Relative 0 %   Basophils Absolute 0.0 0.0 - 0.1 K/uL   Immature Granulocytes 0 %   Abs Immature  Granulocytes 0.03 0.00 - 0.07 K/uL    Comment: Performed at [facility], [address] [place], [place] [address]  Troponin I (High Sensitivity)     Status: Abnormal   Collection Time: 09/16/23  9:18 AM  Result Value Ref Range   Troponin I (High Sensitivity) 18 (H) <18 ng/L    Comment: (NOTE) Elevated high sensitivity troponin I (hsTnI) values and significant  changes across serial measurements may suggest ACS but many other  chronic and acute conditions are known to elevate hsTnI results.  Refer to the "Links" section for chest pain algorithms and additional  guidance. Performed at [facility], [address], [place], [place] [address]   Brain natriuretic peptide     Status: Abnormal   Collection Time: 09/16/23  9:18 AM  Result Value Ref Range   B Natriuretic Peptide 170.8 (H) 0.0 - 100.0 pg/mL    Comment: Performed at [facility], [address] [place], [place] [address]  Troponin I (High Sensitivity)     Status: None   Collection Time: 09/16/23 12:47 PM  Result Value Ref Range   Troponin I (High Sensitivity) 17 <18 ng/L    Comment: (NOTE) Elevated high sensitivity troponin I (hsTnI) values and significant  changes across serial measurements may suggest ACS but many other  chronic and acute conditions are known to elevate hsTnI results.  Refer to the "Links" section for chest pain algorithms and additional  guidance.  Performed at [facility], [address], [place], [place] [address]   Comprehensive metabolic panel with GFR     Status: Abnormal   Collection Time: 09/16/23 12:47 PM  Result Value Ref Range   Sodium 135 135 - 145 mmol/L   Potassium >7.5 (HH) 3.5 - 5.1 mmol/L    Comment: CRITICAL RESULT CALLED TO, READ BACK BY AND VERIFIED WITH C. APPLEGATE, RN AT 1339 06.03.25 D. BLU RECOLLECTED TO VERIFY    Chloride 114 (H) 98 - 111 mmol/L   CO2 15 (L) 22 - 32 mmol/L   Glucose, Bld 126 (H) 70 - 99 mg/dL    Comment: Glucose reference range  applies only to samples taken after fasting for at least 8 hours.   BUN 68 (H) 8 - 23 mg/dL   Creatinine, Ser 6.04 (H) 0.44 - 1.00 mg/dL   Calcium  10.4 (H) 8.9 - 10.3 mg/dL   Total Protein 6.7 6.5 - 8.1 g/dL   Albumin  3.8 3.5 - 5.0 g/dL   AST 15 15 - 41 U/L   ALT 11 0 - 44 U/L   Alkaline Phosphatase 53 38 - 126 U/L   Total Bilirubin 0.4 0.0 - 1.2 mg/dL   GFR, Estimated 15 (L) >60 mL/min    Comment: (NOTE) Calculated using the CKD-EPI Creatinine Equation (2021)    Anion gap 6 5 - 15    Comment: Performed at [facility], [address], [place], [place] [address]  Lipase, blood     Status: None   Collection Time: 09/16/23 12:47 PM  Result Value Ref Range   Lipase 51 11 - 51 U/L    Comment: Performed at [facility], [address] [place], [place] [address]  Basic metabolic panel     Status: Abnormal   Collection Time: 09/16/23  3:25 PM  Result Value Ref Range   Sodium 134 (L) 135 - 145 mmol/L   Potassium >7.5 (HH) 3.5 - 5.1 mmol/L    Comment: CRITICAL RESULT CALLED TO, READ BACK BY AND VERIFIED WITH C. APPLEGATE, RN AT 1615 06.03.25 D. BLU   Chloride 112 (H) 98 - 111 mmol/L   CO2 16 (L) 22 - 32 mmol/L   Glucose, Bld 124 (H) 70 - 99 mg/dL    Comment: Glucose reference range applies only to samples taken after fasting for at least 8 hours.   BUN 67 (H) 8 - 23 mg/dL   Creatinine, Ser 7.82 (H) 0.44 - 1.00 mg/dL   Calcium  10.6 (H) 8.9 - 10.3 mg/dL   GFR, Estimated 16 (L) >60 mL/min    Comment: (NOTE) Calculated using the CKD-EPI Creatinine Equation (2021)    Anion gap 6 5 - 15    Comment: Performed at [facility], [address] [place], [place] [address]  CBG monitoring, ED     Status: Abnormal   Collection Time: 09/16/23  3:26 PM  Result Value Ref Range   Glucose-Capillary 116 (H) 70 - 99 mg/dL    Comment: Glucose reference range applies only to samples taken after fasting for at least 8 hours.  Urinalysis, Routine w reflex microscopic -Urine, Clean  Catch     Status: Abnormal   Collection Time: 09/16/23  4:30 PM  Result Value Ref Range   Color, Urine YELLOW YELLOW   APPearance CLOUDY (A) CLEAR   Specific Gravity, Urine 1.012 1.005 - 1.030   pH 5.0 5.0 - 8.0   Glucose, UA 50 (A) NEGATIVE mg/dL  Hgb urine dipstick NEGATIVE NEGATIVE   Bilirubin Urine NEGATIVE NEGATIVE   Ketones, ur NEGATIVE NEGATIVE mg/dL   Protein, ur NEGATIVE NEGATIVE mg/dL   Nitrite NEGATIVE NEGATIVE   Leukocytes,Ua TRACE (A) NEGATIVE   RBC / HPF 0-5 0 - 5 RBC/hpf   WBC, UA 0-5 0 - 5 WBC/hpf   Bacteria, UA MANY (A) NONE SEEN   Squamous Epithelial / HPF 0-5 0 - 5 /HPF   Mucus PRESENT    Granular Casts, UA PRESENT     Comment: Performed at [facility], [address], [place], [place] [address]  Na and K (sodium & potassium), rand urine     Status: None   Collection Time: 09/16/23  4:38 PM  Result Value Ref Range   Sodium, Ur 57 mmol/L   Potassium Urine 44 mmol/L    Comment: Performed at [facility], [address], [place], [place] [address]  Chloride, urine, random     Status: None   Collection Time: 09/16/23  4:38 PM  Result Value Ref Range   Chloride Urine 79 mmol/L    Comment: Performed at [facility], [address] [place], [place] [address]  CBG monitoring, ED     Status: Abnormal   Collection Time: 09/16/23  5:43 PM  Result Value Ref Range   Glucose-Capillary 110 (H) 70 - 99 mg/dL    Comment: Glucose reference range applies only to samples taken after fasting for at least 8 hours.  Basic metabolic panel     Status: Abnormal   Collection Time: 09/16/23  6:54 PM  Result Value Ref Range   Sodium 134 (L) 135 - 145 mmol/L   Potassium 6.7 (HH) 3.5 - 5.1 mmol/L    Comment: CRITICAL RESULT CALLED TO, READ BACK BY AND VERIFIED WITH A. SIMS, RN AT 1954 06.03.25 D. BLU   Chloride 114 (H) 98 - 111 mmol/L   CO2 12 (L) 22 - 32 mmol/L   Glucose, Bld 145 (H) 70 - 99 mg/dL    Comment: Glucose reference range applies only to samples  taken after fasting for at least 8 hours.   BUN 63 (H) 8 - 23 mg/dL   Creatinine, Ser 2.13 (H) 0.44 - 1.00 mg/dL   Calcium  10.1 8.9 - 10.3 mg/dL   GFR, Estimated 17 (L) >60 mL/min    Comment: (NOTE) Calculated using the CKD-EPI Creatinine Equation (2021)    Anion gap 8 5 - 15    Comment: Performed at [facility], [address] [place], [place] [address]  Glucose, capillary     Status: Abnormal   Collection Time: 09/16/23  8:27 PM  Result Value Ref Range   Glucose-Capillary 173 (H) 70 - 99 mg/dL    Comment: Glucose reference range applies only to samples taken after fasting for at least 8 hours.  Basic metabolic panel     Status: Abnormal   Collection Time: 09/16/23  9:39 PM  Result Value Ref Range   Sodium 135 135 - 145 mmol/L   Potassium 5.8 (H) 3.5 - 5.1 mmol/L   Chloride 114 (H) 98 - 111 mmol/L   CO2 13 (L) 22 - 32 mmol/L   Glucose, Bld 139 (H) 70 - 99 mg/dL    Comment: Glucose reference range applies only to samples taken after fasting for at least 8 hours.   BUN 61 (H) 8 - 23 mg/dL   Creatinine, Ser 8.46 (H) 0.44 - 1.00 mg/dL   Calcium   10.1 8.9 - 10.3 mg/dL   GFR, Estimated 18 (L) >60 mL/min    Comment: (NOTE) Calculated using the CKD-EPI Creatinine Equation (2021)    Anion gap 8 5 - 15    Comment: Performed at [facility], [address], [place], [place] [address]  Basic metabolic panel     Status: Abnormal   Collection Time: 09/17/23  1:41 AM  Result Value Ref Range   Sodium 133 (L) 135 - 145 mmol/L   Potassium 6.8 (HH) 3.5 - 5.1 mmol/L    Comment: CRITICAL RESULT CALLED TO, READ BACK BY AND VERIFIED WITH SIMS A, RN 0205 09/17/2023 SANDOVAL K   Chloride 111 98 - 111 mmol/L   CO2 12 (L) 22 - 32 mmol/L   Glucose, Bld 91 70 - 99 mg/dL    Comment: Glucose reference range applies only to samples taken after fasting for at least 8 hours.   BUN 58 (H) 8 - 23 mg/dL   Creatinine, Ser 6.04 (H) 0.44 - 1.00 mg/dL   Calcium  9.8 8.9 - 10.3 mg/dL   GFR,  Estimated 18 (L) >60 mL/min    Comment: (NOTE) Calculated using the CKD-EPI Creatinine Equation (2021)    Anion gap 10 5 - 15    Comment: Performed at [facility], [address], [place], [place] [address]  Glucose, capillary     Status: None   Collection Time: 09/17/23  3:11 AM  Result Value Ref Range   Glucose-Capillary 93 70 - 99 mg/dL    Comment: Glucose reference range applies only to samples taken after fasting for at least 8 hours.  Basic metabolic panel     Status: Abnormal   Collection Time: 09/17/23  7:50 AM  Result Value Ref Range   Sodium 134 (L) 135 - 145 mmol/L   Potassium 5.6 (H) 3.5 - 5.1 mmol/L   Chloride 113 (H) 98 - 111 mmol/L   CO2 16 (L) 22 - 32 mmol/L   Glucose, Bld 90 70 - 99 mg/dL    Comment: Glucose reference range applies only to samples taken after fasting for at least 8 hours.   BUN 56 (H) 8 - 23 mg/dL   Creatinine, Ser 1.19 (H) 0.44 - 1.00 mg/dL   Calcium  9.8 8.9 - 10.3 mg/dL   GFR, Estimated 19 (L) >60 mL/min    Comment: (NOTE) Calculated using the CKD-EPI Creatinine Equation (2021)    Anion gap 5 5 - 15    Comment: ELECTROLYTES REPEATED TO VERIFY Performed at [facility], [address], [place], [place] [address]   Basic metabolic panel     Status: Abnormal   Collection Time: 09/17/23 11:04 AM  Result Value Ref Range   Sodium 136 135 - 145 mmol/L   Potassium 5.3 (H) 3.5 - 5.1 mmol/L   Chloride 111 98 - 111 mmol/L   CO2 17 (L) 22 - 32 mmol/L   Glucose, Bld 88 70 - 99 mg/dL    Comment: Glucose reference range applies only to samples taken after fasting for at least 8 hours.   BUN 53 (H) 8 - 23 mg/dL   Creatinine, Ser 9.56 (H) 0.44 - 1.00 mg/dL   Calcium  9.7 8.9 - 10.3 mg/dL   GFR, Estimated 18 (L) >60 mL/min    Comment: (NOTE) Calculated using the CKD-EPI Creatinine Equation (2021)    Anion gap 8 5 - 15    Comment: Performed at [facility], [address],  [place], [place] [address]    US  EKG SITE RITE Result  Date: 09/17/2023 If Site Rite image not attached, placement could not be confirmed due to current cardiac rhythm.  CT Renal Stone Study Result Date: 09/16/2023 CLINICAL DATA:  Flank pain. EXAM: CT ABDOMEN AND PELVIS WITHOUT CONTRAST TECHNIQUE: Multidetector CT imaging of the abdomen and pelvis was performed following the standard protocol without IV contrast. RADIATION DOSE REDUCTION: This exam was performed according to the departmental dose-optimization program which includes automated exposure control, adjustment of the mA and/or kV according to patient size and/or use of iterative reconstruction technique. COMPARISON:  July 09, 2021. FINDINGS: Lower chest: No acute abnormality. Hepatobiliary: No focal liver abnormality is seen. Status post cholecystectomy. No biliary dilatation. Pancreas: Unremarkable. No pancreatic ductal dilatation or surrounding inflammatory changes. Spleen: Normal in size without focal abnormality. Adrenals/Urinary Tract: Adrenal glands are unremarkable. No hydronephrosis or renal obstruction is noted. Bilateral renal cortical scarring is noted. Stable exophytic cyst seen involving upper pole of right kidney. Bladder is unremarkable. Stomach/Bowel: Stomach is within normal limits. Appendix appears normal. No evidence of bowel wall thickening, distention, or inflammatory changes. Vascular/Lymphatic: Aortic atherosclerosis. No enlarged abdominal or pelvic lymph nodes. Reproductive: Status post hysterectomy. No adnexal masses. Other: No ascites or hernia is noted. Musculoskeletal: Status post surgical posterior fusion of L3-4 and L4-5. No acute osseous abnormality is noted. IMPRESSION: No acute abnormality seen in the abdomen or pelvis. Aortic Atherosclerosis (ICD10-I70.0). Electronically Signed   By: Rosalene Colon M.D.   On: 09/16/2023 16:36   DG Chest Portable 1 View Result Date: 09/16/2023 CLINICAL DATA:  Chest pain EXAM: PORTABLE CHEST 1 VIEW COMPARISON:  March 18, 2023 FINDINGS:  The heart size and mediastinal contours are within normal limits. Both lungs are clear. The visualized skeletal structures are unremarkable. IMPRESSION: No active disease. Electronically Signed   By: Fredrich Jefferson M.D.   On: 09/16/2023 09:13    Assessment/PlanMary Teyonna [name] is an 78 y.o. female with CAD s/p PCI, A fib, HTN, HL, DM, h/o CVA who is currently admitted for back pain and nephrology is consulted for AKI on CKD and hyperkalemia.   **AKI on CKD: Baseline Cr 1.2-1.5mg /dL over past 6 months, now presenting with Cr ~3 which is improving to 2.5 with IV hydration. UA bland, imaging unrevealing.  Suspect underlying arterionephrosclerosis possibly now with a prerenal insult + ARB + NSAID.  With bland UA no need for serologies or biopsy.  Continue IV hydration, hold ARB, avoiding hypotension as able, avoid nephrotoxins.  Continue serial labs, strict I/Os.     **Hyperkalemia:  Multifactorial - hypovolemia/AKI, KCl supplement, ARB.   Finally improving with heavy med management - cont IVF and lokelma for now.   Continue serial labs - q6h for now.  Team to r/o adrenal insufficiency.   Not able to dx RTA in setting of AKI but labs in 05/2023 did show metabolic acidosis out of proportion to GFR so plausible in a longstanding diabetic.  Management wont' be changed in the acute setting either way.   **NAGMA: in setting of AKI, as above r/o RTA in future.  For now on bicarb gtt.    **DM: well controlled, holding metformin .   **Anemia: mild, chronic and at baseline.   **HTN: meds on hold with AKI and low BP this AM.    **CAD,Atypical CP: cardiology has signed off, not ACS.   Will follow, please reach out with concerns.   Baron Border 09/17/2023, 12:36 PM

## 2023-09-17 NOTE — Plan of Care (Signed)

## 2023-09-18 ENCOUNTER — Other Ambulatory Visit (HOSPITAL_COMMUNITY): Payer: Self-pay

## 2023-09-18 DIAGNOSIS — E875 Hyperkalemia: Secondary | ICD-10-CM | POA: Diagnosis not present

## 2023-09-18 DIAGNOSIS — N179 Acute kidney failure, unspecified: Secondary | ICD-10-CM | POA: Diagnosis not present

## 2023-09-18 DIAGNOSIS — I480 Paroxysmal atrial fibrillation: Secondary | ICD-10-CM

## 2023-09-18 LAB — BASIC METABOLIC PANEL WITH GFR
Anion gap: 6 (ref 5–15)
Anion gap: 7 (ref 5–15)
BUN: 43 mg/dL — ABNORMAL HIGH (ref 8–23)
BUN: 48 mg/dL — ABNORMAL HIGH (ref 8–23)
CO2: 21 mmol/L — ABNORMAL LOW (ref 22–32)
CO2: 22 mmol/L (ref 22–32)
Calcium: 8.9 mg/dL (ref 8.9–10.3)
Calcium: 8.9 mg/dL (ref 8.9–10.3)
Chloride: 111 mmol/L (ref 98–111)
Chloride: 111 mmol/L (ref 98–111)
Creatinine, Ser: 1.96 mg/dL — ABNORMAL HIGH (ref 0.44–1.00)
Creatinine, Ser: 2 mg/dL — ABNORMAL HIGH (ref 0.44–1.00)
GFR, Estimated: 25 mL/min — ABNORMAL LOW (ref >60)
GFR, Estimated: 26 mL/min — ABNORMAL LOW (ref >60)
Glucose, Bld: 115 mg/dL — ABNORMAL HIGH (ref 70–99)
Glucose, Bld: 98 mg/dL (ref 70–99)
Potassium: 4 mmol/L (ref 3.5–5.1)
Potassium: 4.3 mmol/L (ref 3.5–5.1)
Sodium: 138 mmol/L (ref 135–145)
Sodium: 140 mmol/L (ref 135–145)

## 2023-09-18 MED ORDER — GABAPENTIN 600 MG PO TABS
600.0000 mg | ORAL_TABLET | Freq: Every day | ORAL | 0 refills | Status: DC
  Filled 2023-09-18: qty 30, 30d supply, fill #0

## 2023-09-18 MED ORDER — AMLODIPINE BESYLATE 10 MG PO TABS
10.0000 mg | ORAL_TABLET | Freq: Every day | ORAL | 0 refills | Status: DC
  Filled 2023-09-18: qty 30, 30d supply, fill #0

## 2023-09-18 MED ORDER — ATORVASTATIN CALCIUM 80 MG PO TABS: 80.0000 mg | ORAL_TABLET | Freq: Every day | ORAL | Status: DC

## 2023-09-18 MED ORDER — ATORVASTATIN CALCIUM 80 MG PO TABS
80.0000 mg | ORAL_TABLET | Freq: Every day | ORAL | 0 refills | Status: AC
Start: 2023-09-19 — End: ?
  Filled 2023-09-18: qty 30, 30d supply, fill #0

## 2023-09-18 MED ORDER — APIXABAN 5 MG PO TABS
5.0000 mg | ORAL_TABLET | Freq: Two times a day (BID) | ORAL | 0 refills | Status: DC
  Filled 2023-09-18: qty 60, 30d supply, fill #0

## 2023-09-18 MED ORDER — EZETIMIBE 10 MG PO TABS
10.0000 mg | ORAL_TABLET | Freq: Every day | ORAL | 0 refills | Status: DC
  Filled 2023-09-18: qty 30, 30d supply, fill #0

## 2023-09-18 MED ORDER — CLOPIDOGREL BISULFATE 75 MG PO TABS
75.0000 mg | ORAL_TABLET | Freq: Every day | ORAL | 0 refills | Status: DC
  Filled 2023-09-18: qty 30, 30d supply, fill #0

## 2023-09-18 NOTE — Evaluation (Signed)
 Physical Therapy Evaluation Patient Details Name: Marcina Kinnison MRN: [id number] DOB: Nov 01, 1945 Today's Date: 09/18/2023  History of Present Illness  Pt is 78 yo presenting to [facility] ED on 6/3 due to Sharp pain in the center of her chest and shortness of breathe. PMH: DM II, HLD, HTN, CAD s/p MI, CVA, CKD III, Vit D deficiency.  Clinical Impression  Pt is currently slightly below baseline level of functioning. Pt is Mod I for bed mobility, sit to stand and gait with RW. Pt was using a RW 1 year ago due to back surgery and has not been using one since that time. Pt spouse or family can be available 24/7 if needed. Currently pt is presenting at baseline level of functioning and no skilled physical therapy services recommended. Pt will be discharged from skilled physical therapy services at this time; please re-consult if further needs arise. Will benefit from mobility while in acute setting in order to decrease risk for debilitation.       If plan is discharge home, recommend the following: Assistance with cooking/housework;Assist for transportation     Equipment Recommendations None recommended by PT     Functional Status Assessment Patient has had a recent decline in their functional status and demonstrates the ability to make significant improvements in function in a reasonable and predictable amount of time.     Precautions / Restrictions Precautions Precautions: Fall Recall of Precautions/Restrictions: Intact Restrictions Weight Bearing Restrictions Per Provider Order: No      Mobility  Bed Mobility Overal bed mobility: Modified Independent             General bed mobility comments: increased time; in recliner on departure.    Transfers Overall transfer level: Modified independent Equipment used: Rolling walker (2 wheels)               General transfer comment: Good hand placement.    Ambulation/Gait Ambulation/Gait assistance: Modified independent (Device/Increase  time) Gait Distance (Feet): 200 Feet Assistive device: Rolling walker (2 wheels) Gait Pattern/deviations: Step-through pattern, Decreased stride length Gait velocity: decreased Gait velocity interpretation: 1.31 - 2.62 ft/sec, indicative of limited community ambulator   General Gait Details: steady, no significant deviations  Stairs Stairs:  (ramped entrance)             Balance Overall balance assessment: Modified Independent         Pertinent Vitals/Pain Pain Assessment Pain Assessment: No/denies pain    Home Living Family/patient expects to be discharged to:: Private residence Living Arrangements: Spouse/significant other Available Help at Discharge: Family;Available 24 hours/day Type of Home: House Home Access: Ramped entrance       Home Layout: One level Home Equipment: Rolling Walker (2 wheels);BSC/3in1;Cane - single point;Grab bars - tub/shower;Shower seat      Prior Function Prior Level of Function : Independent/Modified Independent             Mobility Comments: Pt reports ind with ambulation and 1 fall ADLs Comments: Reports ind with ADL's and IADL's     Extremity/Trunk Assessment   Upper Extremity Assessment Upper Extremity Assessment: Overall WFL for tasks assessed    Lower Extremity Assessment Lower Extremity Assessment: Overall WFL for tasks assessed    Cervical / Trunk Assessment Cervical / Trunk Assessment: Normal  Communication   Communication Communication: No apparent difficulties    Cognition Arousal: Alert Behavior During Therapy: WFL for tasks assessed/performed   PT - Cognitive impairments: No apparent impairments     Following commands: Intact  Cueing Cueing Techniques: Verbal cues     General Comments General comments (skin integrity, edema, etc.): Spouse present throughout session        Assessment/Plan    PT Assessment Patient does not need any further PT services         PT Goals (Current  goals can be found in the Care Plan section)  Acute Rehab PT Goals PT Goal Formulation: All assessment and education complete, DC therapy            AM-PAC PT "6 Clicks" Mobility  Outcome Measure Help needed turning from your back to your side while in a flat bed without using bedrails?: None Help needed moving from lying on your back to sitting on the side of a flat bed without using bedrails?: None Help needed moving to and from a bed to a chair (including a wheelchair)?: None Help needed standing up from a chair using your arms (e.g., wheelchair or bedside chair)?: None Help needed to walk in hospital room?: None Help needed climbing 3-5 steps with a railing? : A Little 6 Click Score: 23    End of Session Equipment Utilized During Treatment: Gait belt Activity Tolerance: Patient tolerated treatment well Patient left: in chair;with call bell/phone within reach;with family/visitor present Nurse Communication: Mobility status      Time: 0981-1914 PT Time Calculation (min) (ACUTE ONLY): 23 min   Charges:   PT Evaluation $PT Eval Low Complexity: 1 Low PT Treatments $Therapeutic Activity: 8-22 mins PT General Charges $$ ACUTE PT VISIT: 1 Visit          Sloan Duncans, DPT, CLT  Acute Rehabilitation Services Office: [phone] (Secure chat preferred)   Jenice Mitts 09/18/2023, 11:03 AM

## 2023-09-18 NOTE — TOC Progression Note (Signed)
 Transition of Care [facility]) - Progression Note    Patient Details  Name: Debbie [name] MRN: [id number] Date of Birth: 1945-10-30  [facility] CM/SW Contact  Samiksha Pellicano A [place], LCSW Phone Number: 09/18/2023, 11:52 AM  Clinical Narrative:     CSW provided pt with a list of PCP providers in the area at bedside to assist with establishing new provider. Pt has hospital follow up scheduled, in pt's AVS.   No other needs identified at this time. TOC will sign off, please consult again if TOC needs arise.         Expected Discharge Plan and Services         Expected Discharge Date: 09/18/23                                     Social Determinants of Health (SDOH) Interventions SDOH Screenings   Food Insecurity: No Food Insecurity (09/16/2023)  Housing: Low Risk  (09/16/2023)  Transportation Needs: No Transportation Needs (09/16/2023)  Utilities: Not At Risk (09/16/2023)  Alcohol Screen: Low Risk  (07/10/2021)  Financial Resource Strain: Low Risk  (07/10/2021)  Social Connections: Moderately Integrated (09/16/2023)  Tobacco Use: High Risk (09/16/2023)    Readmission Risk Interventions     No data to display

## 2023-09-18 NOTE — Progress Notes (Signed)
 [facility] Progress Note   Subjective:   improved, plans for d/c today.    Objective Vitals:   09/17/23 2046 09/18/23 0038 09/18/23 0443 09/18/23 0815  BP: (!) 159/59 (!) 143/55 (!) 154/55 (!) 157/77  Pulse: 60 (!) 57 61 (!) 57  Resp: 18 16 18 16   Temp: 98.8 F (37.1 C) 99.3 F (37.4 C) 98.4 F (36.9 C) 97.7 F (36.5 C)  TempSrc:  Oral Oral Oral  SpO2: 99% 98% 97%   Weight:      Height:       Physical Exam General: appearing well Heart:RRR Lungs: clear Abdomen: soft Extremities: no edema Neuro: nonfocal   Additional Objective Labs: Basic Metabolic Panel: Recent Labs  Lab 09/17/23 1818 09/17/23 2353 09/18/23 0550  NA 137 138 140  K 4.8 4.3 4.0  CL 110 111 111  CO2 19* 21* 22  GLUCOSE 133* 115* 98  BUN 51* 48* 43*  CREATININE 2.15* 2.00* 1.96*  CALCIUM  9.1 8.9 8.9   Liver Function Tests: Recent Labs  Lab 09/16/23 1247  AST 15  ALT 11  ALKPHOS 53  BILITOT 0.4  PROT 6.7  ALBUMIN  3.8   Recent Labs  Lab 09/16/23 1247  LIPASE 51   CBC: Recent Labs  Lab 09/16/23 0918  WBC 10.4  NEUTROABS 7.8*  HGB 11.4*  HCT 35.8*  MCV 98.1  PLT 306   Blood Culture    Component Value Date/Time   SDES URINE, RANDOM 06/11/2023 1446   SPECREQUEST NONE 06/11/2023 1446   CULT (A) 06/11/2023 1446    80,000 COLONIES/mL KLEBSIELLA PNEUMONIAE Two isolates with different morphologies were identified as the same organism.The most resistant organism was reported. Performed at [facility], [address], [place], [place] [address]    REPTSTATUS 06/14/2023 FINAL 06/11/2023 1446    Cardiac Enzymes: No results for input(s): "CKTOTAL", "CKMB", "CKMBINDEX", "TROPONINI" in the last 168 hours. CBG: Recent Labs  Lab 09/16/23 1526 09/16/23 1743 09/16/23 2027 09/17/23 0311  GLUCAP 116* 110* 173* 93   Iron Studies: No results for input(s): "IRON", "TIBC", "TRANSFERRIN", "FERRITIN" in the last 72 hours. @lablastinr3 @ Studies/Results: US  EKG  SITE RITE Result Date: 09/17/2023 If Site Rite image not attached, placement could not be confirmed due to current cardiac rhythm.  CT Renal Stone Study Result Date: 09/16/2023 CLINICAL DATA:  Flank pain. EXAM: CT ABDOMEN AND PELVIS WITHOUT CONTRAST TECHNIQUE: Multidetector CT imaging of the abdomen and pelvis was performed following the standard protocol without IV contrast. RADIATION DOSE REDUCTION: This exam was performed according to the departmental dose-optimization program which includes automated exposure control, adjustment of the mA and/or kV according to patient size and/or use of iterative reconstruction technique. COMPARISON:  July 09, 2021. FINDINGS: Lower chest: No acute abnormality. Hepatobiliary: No focal liver abnormality is seen. Status post cholecystectomy. No biliary dilatation. Pancreas: Unremarkable. No pancreatic ductal dilatation or surrounding inflammatory changes. Spleen: Normal in size without focal abnormality. Adrenals/Urinary Tract: Adrenal glands are unremarkable. No hydronephrosis or renal obstruction is noted. Bilateral renal cortical scarring is noted. Stable exophytic cyst seen involving upper pole of right kidney. Bladder is unremarkable. Stomach/Bowel: Stomach is within normal limits. Appendix appears normal. No evidence of bowel wall thickening, distention, or inflammatory changes. Vascular/Lymphatic: Aortic atherosclerosis. No enlarged abdominal or pelvic lymph nodes. Reproductive: Status post hysterectomy. No adnexal masses. Other: No ascites or hernia is noted. Musculoskeletal: Status post surgical posterior fusion of L3-4 and L4-5. No acute osseous abnormality is noted. IMPRESSION: No acute abnormality seen  in the abdomen or pelvis. Aortic Atherosclerosis (ICD10-I70.0). Electronically Signed   By: Rosalene Colon M.D.   On: 09/16/2023 16:36   Medications:   apixaban   5 mg Oral BID   [START ON 09/19/2023] atorvastatin   80 mg Oral Daily   Chlorhexidine  Gluconate Cloth   6 each Topical Daily   clopidogrel   75 mg Oral Daily   gabapentin   300 mg Oral QHS   And   gabapentin   300 mg Oral QHS   lidocaine   1 patch Transdermal Q24H   polyethylene glycol  17 g Oral Daily   senna-docusate  1 tablet Oral QHS    Assessment/PlanMary Jolly [name] is an 78 y.o. female with CAD s/p PCI, A fib, HTN, HL, DM, h/o CVA who is currently admitted for back pain and nephrology is consulted for AKI on CKD and hyperkalemia.    **AKI on CKD: Baseline Cr 1.2-1.5mg /dL over past 6 months, presenting with Cr ~3.  UA bland, imaging unrevealing.  Suspect underlying arterionephrosclerosis possibly now with a prerenal insult + ARB + NSAID.  With bland UA no need for serologies or biopsy.  Cr has improved to 2 in 24h with IV hydration, hold ARB and NSAID.  If labs returned to baseline outpt labs next week ok to resume ARB.    **Hyperkalemia:  Multifactorial - hypovolemia/AKI, KCl supplement, ARB.   Improved with med mgmt.  Agree with holding ARB, NSAID, KCl at d/c. Not able to dx RTA in setting of AKI but labs in 05/2023 did show metabolic acidosis out of proportion to GFR so plausible in a longstanding diabetic.  Management wont' be changed in the acute setting either way.    **NAGMA: in setting of AKI, as above r/o RTA in future.  No need for bicarb supplement on d/c at this time.    **DM: well controlled, holding metformin .    **Anemia: mild, chronic and at baseline.    **HTN: BP was low on admission in setting of hypovolemia, has improved with IVF.    **CAD,Atypical CP: cardiology has signed off, not ACS.    Confirmed seeing PCP next week for labs.  Call me if issues o/w will see her in nephrology clinic 6-8wks. [facility] will reach out with info.      Adrian Alba MD 09/18/2023, 12:35 PM   [facility] Pager: [phone]

## 2023-09-18 NOTE — Plan of Care (Signed)

## 2023-09-18 NOTE — Discharge Summary (Signed)
 Name: Debbie [name] MRN: [id number] DOB: 07/16/1945 78 y.o. PCP: Debbie Bacon, MD  Date of Admission: 09/16/2023  8:32 AM Date of Discharge:  09/18/2023 Attending Physician: Dr. Jarvis [name]  DISCHARGE DIAGNOSIS:  Primary Problem: Hyperkalemia   Hospital Problems: Principal Problem:   Hyperkalemia Active Problems:   AKI (acute kidney injury) (HCC)    DISCHARGE MEDICATIONS:   Allergies as of 09/18/2023       Reactions   Fish-derived Products         Medication List     PAUSE taking these medications    furosemide  20 MG tablet Wait to take this until your doctor or other care provider tells you to start again. Commonly known as: LASIX  Take 1 tablet by mouth once daily   isosorbide  mononitrate 30 MG 24 hr tablet Wait to take this until your doctor or other care provider tells you to start again. Commonly known as: IMDUR  Take 1 tablet (30 mg total) by mouth daily.   metFORMIN  1000 MG tablet Wait to take this until your doctor or other care provider tells you to start again. Commonly known as: GLUCOPHAGE  Take 1,000 mg by mouth 2 (two) times daily.   metoprolol  succinate 50 MG 24 hr tablet Wait to take this until your doctor or other care provider tells you to start again. Commonly known as: TOPROL -XL Take 50 mg by mouth daily.   olmesartan  20 MG tablet Wait to take this until your doctor or other care provider tells you to start again. Commonly known as: BENICAR  Take 20 mg by mouth daily.       STOP taking these medications    aspirin  EC 81 MG tablet   ketorolac  10 MG tablet Commonly known as: TORADOL    potassium chloride  SA 20 MEQ tablet Commonly known as: KLOR-CON  M   rosuvastatin  20 MG tablet Commonly known as: CRESTOR    rosuvastatin  5 MG tablet Commonly known as: CRESTOR        TAKE these medications    acetaminophen  500 MG tablet Commonly known as: TYLENOL  Take 2 tablets (1,000 mg total) by mouth every 8 (eight) hours as needed. What changed:  reasons to take this   albuterol 108 (90 Base) MCG/ACT inhaler Commonly known as: VENTOLIN HFA Inhale 2 puffs into the lungs every 6 (six) hours as needed for shortness of breath or wheezing.   amLODipine  10 MG tablet Commonly known as: NORVASC  Take 1 tablet (10 mg total) by mouth daily.   apixaban  5 MG Tabs tablet Commonly known as: Eliquis  Take 1 tablet (5 mg total) by mouth 2 (two) times daily.   atorvastatin  80 MG tablet Commonly known as: LIPITOR  Take 1 tablet (80 mg total) by mouth daily. Start taking on: September 19, 2023   clopidogrel  75 MG tablet Commonly known as: PLAVIX  Take 1 tablet (75 mg total) by mouth daily.   estradiol  0.1 MG/GM vaginal cream Commonly known as: ESTRACE  Place 0.5 g vaginally 2 (two) times a week. Place 0.5g nightly for two weeks then twice a week after   ezetimibe  10 MG tablet Commonly known as: ZETIA  Take 1 tablet (10 mg total) by mouth daily.   gabapentin  600 MG tablet Commonly known as: NEURONTIN  Take 1 tablet (600 mg total) by mouth at bedtime.   multivitamin capsule Take 1 capsule by mouth daily.   nitroGLYCERIN  0.4 MG SL tablet Commonly known as: NITROSTAT  Place 1 tablet (0.4 mg total) under the tongue every 5 (five) minutes as needed for chest pain.  oxyCODONE  5 MG immediate release tablet Commonly known as: Oxy IR/ROXICODONE  Take 5 mg by mouth 4 (four) times daily as needed.   Repatha  SureClick 140 MG/ML Soaj Generic drug: Evolocumab  Inject 140 mg into the skin every 14 (fourteen) days.        DISPOSITION AND FOLLOW-UP:  [name] was discharged from [name] in stable condition. At the hospital follow up visit please address:  Follow-up Recommendations: Consults: Nephrology. Will follow-up with them in 1 month - please be sure patient is aware. Labs: Basic Metabolic Profile Medications: Patient was discharged with instructions to take amlodipine , atorvastatin , and continue her PRN  medications (albuterol, nitroglycerin ). We paused several medications (Lasix , metformin , metoprolol , olmesartan , Imdur ) due to concern for her kidney function. Please evaluate for appropriateness of this regimen and make any changes as indicated.   Follow-up Appointments: Debbie [name] follow-up with Debbie [name]. Will establish care with a different PCP at a later date.  - Nephrology follow-up in 1 month   HOSPITAL COURSE:  Patient Summary: Debbie [name] is a 78 y.o. female with a past medical history of T2DM, HLD, HTN, CAD w/MI s/p stent placement x3, CVA, and stage 3a CKD who presented after acute onset of chest pain and shortness of breath with several days of low back pain, cleared from a cardiology perspective, then admitted for AKI with associated hyperkalemia.   Below is a description of the patient's hospitalization by problem.   Hyperkalemia  Patient found to be hyperkalemic to > 7.5. Suspect multifactorial given history of documented CKD 3a, combination of several renally toxic medications in home regimen including Lasix , Metformin , daily oxycodone , olmesartan , potassium, and Ketorolac , as well as decreased intake. All nephrotoxic medications were held throughout admission. Her EKGs did not show any changes. Her K+ initially remained elevated despite several rounds of temporizing measures including Lokelma/calcium  gluconate/insulin /albuterol/IVF. Nephrology was consulted, who providing assistance in continuing temporizing measures including bicarb infusion. With continued temporizing measures, her K+ decreased to 4.0 by the day of discharge and she was tolerating PO well.   AKI  Stage 3a CKD  Non-anion gap metabolic acidosis  Hx urinary retention  Cr elevated to 2.98 on admission from baseline 1.5 in 05/2023. Likely a combination of prerenal and intrarenal etiology given reduced PO intake over the last week and renally toxic medications as described above. Given prior history of urinary  retention, she was monitored closely but maintained adequate UOP. Urine lytes were obtained given non-anion gap metabolic acidosis noted on labs, however these were unremarkable. On day of discharge, her Cr had improved close to her baseline.   Bilateral low back pain  Presented with several days of dull, bilateral low back pain. Suspect musculoskeletal etiology given tenderness to palpation was localized to the bilateral paraspinal region. No red flag symptoms such as nighttime awakenings, bowel or bladder incontinence, or midline spinal tenderness. CT renal stone study was unremarkable. Her pain improved on day of discharge and was managed with PRN Tylenol  and lidocaine  patches.   Chest pain, resolved  Hx CAD s/p PCI RCA (2003, 2023) Initial trop 18, repeat stable at 17. EKG without ischemic changes. Cardiology was consulted in the ED, who supported low suspicion for ACS given negative delta trop. Her chest pain resolved throughout admission. Her home Plavix  was continued.   Atrial fibrillation: Continued home Eliquis . HELD metoprolol  due to soft blood pressures.  Hypertension: HELD home amlodipine  and Imdur  due to soft blood pressures.  T2DM: Held home Metformin  as described  above.  Hyperlipidemia: Switched home rosuvastatin  to atorvastatin  due to AKI. Will continue atorvastatin  at time of discharge.     DISCHARGE INSTRUCTIONS:   Discharge Instructions     Diet - low sodium heart healthy   Complete by: As directed    Discharge instructions   Complete by: As directed    You were admitted to the hospital for high potassium and an AKI (acute kidney injury). You were treated with fluids and a variety of medications and your potassium level is now normal. Your kidney function has improved, but we would recommend following up with Nephrology in 1 month as well as next week as below.   Please plan to follow-up one time in the Internal Medicine Clinic on June 12th at 2:15pm with Dr. Esaw Heckler for a  lab check and a hospital follow-up.  [address] in [name]  Additionally, you will be contacted by [facility] for an appointment with Dr. Higinio Love. If you do not hear from them by tomorrow, please call them. Phone number [phone].   Increase activity slowly   Complete by: As directed        SUBJECTIVE:  Patient reports she is feeling well. She is continuing to eat and drink at her baseline. Denies any continued pain to her chest or back. She has been able to ambulate around her room without difficulty. Denies new concerns this morning.   Discharge Vitals:   BP (!) 157/77 (BP Location: Right Arm)   Pulse (!) 57   Temp 97.7 F (36.5 C) (Oral)   Resp 16   Ht 5\' 6"  (1.676 m)   Wt 79.4 kg   SpO2 97%   BMI 28.25 kg/m   OBJECTIVE:  Physical Exam Constitutional:      General: She is not in acute distress. HENT:     Head: Normocephalic and atraumatic.  Cardiovascular:     Rate and Rhythm: Normal rate and regular rhythm.     Heart sounds: Normal heart sounds.  Pulmonary:     Effort: Pulmonary effort is normal. No tachypnea.     Breath sounds: Normal breath sounds. No wheezing.  Chest:     Chest wall: No tenderness.  Abdominal:     General: Bowel sounds are normal.     Palpations: Abdomen is soft.     Tenderness: There is no abdominal tenderness. There is no guarding or rebound.  Musculoskeletal:        General: Normal range of motion.  Skin:    General: Skin is warm and dry.     Capillary Refill: Capillary refill takes less than 2 seconds.     Findings: No rash.  Neurological:     General: No focal deficit present.     Mental Status: She is alert.  Psychiatric:        Mood and Affect: Mood normal.        Behavior: Behavior normal.     Pertinent Labs, Studies, and Procedures:     Latest Ref Rng & Units 09/16/2023    9:18 AM 06/10/2023    9:54 AM 05/20/2023   10:39 PM  CBC  WBC 4.0 - 10.5 K/uL 10.4  9.3  9.3    Hemoglobin 12.0 - 15.0 g/dL 09.8  11.9  14.7   Hematocrit 36.0 - 46.0 % 35.8  36.5  33.6   Platelets 150 - 400 K/uL 306  382  292        Latest Ref  Rng & Units 09/18/2023    5:50 AM 09/17/2023   11:53 PM 09/17/2023    6:18 PM  CMP  Glucose 70 - 99 mg/dL 98  401  027   BUN 8 - 23 mg/dL 43  48  51   Creatinine 0.44 - 1.00 mg/dL 2.53  6.64  4.03   Sodium 135 - 145 mmol/L 140  138  137   Potassium 3.5 - 5.1 mmol/L 4.0  4.3  4.8   Chloride 98 - 111 mmol/L 111  111  110   CO2 22 - 32 mmol/L 22  21  19    Calcium  8.9 - 10.3 mg/dL 8.9  8.9  9.1     US  EKG SITE RITE Result Date: 09/17/2023 If Site Rite image not attached, placement could not be confirmed due to current cardiac rhythm.  CT Renal Stone Study Result Date: 09/16/2023 CLINICAL DATA:  Flank pain. EXAM: CT ABDOMEN AND PELVIS WITHOUT CONTRAST TECHNIQUE: Multidetector CT imaging of the abdomen and pelvis was performed following the standard protocol without IV contrast. RADIATION DOSE REDUCTION: This exam was performed according to the departmental dose-optimization program which includes automated exposure control, adjustment of the mA and/or kV according to patient size and/or use of iterative reconstruction technique. COMPARISON:  July 09, 2021. FINDINGS: Lower chest: No acute abnormality. Hepatobiliary: No focal liver abnormality is seen. Status post cholecystectomy. No biliary dilatation. Pancreas: Unremarkable. No pancreatic ductal dilatation or surrounding inflammatory changes. Spleen: Normal in size without focal abnormality. Adrenals/Urinary Tract: Adrenal glands are unremarkable. No hydronephrosis or renal obstruction is noted. Bilateral renal cortical scarring is noted. Stable exophytic cyst seen involving upper pole of right kidney. Bladder is unremarkable. Stomach/Bowel: Stomach is within normal limits. Appendix appears normal. No evidence of bowel wall thickening, distention, or inflammatory changes. Vascular/Lymphatic: Aortic  atherosclerosis. No enlarged abdominal or pelvic lymph nodes. Reproductive: Status post hysterectomy. No adnexal masses. Other: No ascites or hernia is noted. Musculoskeletal: Status post surgical posterior fusion of L3-4 and L4-5. No acute osseous abnormality is noted. IMPRESSION: No acute abnormality seen in the abdomen or pelvis. Aortic Atherosclerosis (ICD10-I70.0). Electronically Signed   By: Rosalene Colon M.D.   On: 09/16/2023 16:36   DG Chest Portable 1 View Result Date: 09/16/2023 CLINICAL DATA:  Chest pain EXAM: PORTABLE CHEST 1 VIEW COMPARISON:  March 18, 2023 FINDINGS: The heart size and mediastinal contours are within normal limits. Both lungs are clear. The visualized skeletal structures are unremarkable. IMPRESSION: No active disease. Electronically Signed   By: Fredrich Jefferson M.D.   On: 09/16/2023 09:13     Signed:  Harlie Lieu, MS4  Carleen Chary, DO Internal Medicine Resident, PGY-1 [facility]  11:45 AM, 09/18/2023

## 2023-09-24 ENCOUNTER — Other Ambulatory Visit (HOSPITAL_COMMUNITY): Payer: Self-pay

## 2023-09-25 ENCOUNTER — Encounter: Payer: Self-pay | Admitting: Internal Medicine

## 2023-09-25 ENCOUNTER — Ambulatory Visit: Admitting: Internal Medicine

## 2023-09-25 VITALS — BP 169/71 | HR 74 | Temp 98.1°F | Ht 66.0 in | Wt 182.0 lb

## 2023-09-25 DIAGNOSIS — E875 Hyperkalemia: Secondary | ICD-10-CM | POA: Diagnosis not present

## 2023-09-25 DIAGNOSIS — N179 Acute kidney failure, unspecified: Secondary | ICD-10-CM | POA: Diagnosis not present

## 2023-09-25 DIAGNOSIS — I200 Unstable angina: Secondary | ICD-10-CM

## 2023-09-25 DIAGNOSIS — R0789 Other chest pain: Secondary | ICD-10-CM | POA: Diagnosis not present

## 2023-09-25 DIAGNOSIS — I10 Essential (primary) hypertension: Secondary | ICD-10-CM

## 2023-09-25 NOTE — Progress Notes (Signed)
 Subjective:  CC: HFU  HPI:  [name] is a 78 y.o. female with a past medical history stated below and presents today for above. Please see problem based assessment and plan for additional details.  Past Medical History:  Diagnosis Date   Arthritis    CAD (coronary artery disease) 11/1999   RCA stent X3, patent '06. Nuc low risk 9/13   Cataract    beginning stages   Diabetes mellitus without complication (HCC)    Dysrhythmia    History of stress test 01/08/2012   Showed minimal anterior thinning not felt to be significant.   Hx of echocardiogram 12/12/2011   EF>55%   Hypercholesteremia    Hypertension    Myocardial infarction [facility])    18 years ago    age 78   PAF (paroxysmal atrial fibrillation) (HCC) 11/2011   SSS component with some bradycardia   Stroke (HCC) 1998   Vitamin D deficiency     Current Outpatient Medications on File Prior to Visit  Medication Sig Dispense Refill   acetaminophen  (TYLENOL ) 500 MG tablet Take 2 tablets (1,000 mg total) by mouth every 8 (eight) hours as needed. (Patient taking differently: Take 1,000 mg by mouth every 8 (eight) hours as needed for moderate pain (pain score 4-6).) 30 tablet 0   albuterol  (VENTOLIN  HFA) 108 (90 Base) MCG/ACT inhaler Inhale 2 puffs into the lungs every 6 (six) hours as needed for shortness of breath or wheezing.     amLODipine  (NORVASC ) 10 MG tablet Take 1 tablet (10 mg total) by mouth daily. 30 tablet 0   apixaban  (ELIQUIS ) 5 MG TABS tablet Take 1 tablet (5 mg total) by mouth 2 (two) times daily. 60 tablet 0   atorvastatin  (LIPITOR ) 80 MG tablet Take 1 tablet (80 mg total) by mouth daily. 30 tablet 0   clopidogrel  (PLAVIX ) 75 MG tablet Take 1 tablet (75 mg total) by mouth daily. 30 tablet 0   estradiol  (ESTRACE ) 0.1 MG/GM vaginal cream Place 0.5 g vaginally 2 (two) times a week. Place 0.5g nightly for two weeks then twice a week after 30 g 3   Evolocumab  (REPATHA  SURECLICK) 140 MG/ML SOAJ Inject 140 mg  into the skin every 14 (fourteen) days. 6 mL 3   ezetimibe  (ZETIA ) 10 MG tablet Take 1 tablet (10 mg total) by mouth daily. 30 tablet 0   [Paused] furosemide  (LASIX ) 20 MG tablet Take 1 tablet by mouth once daily 90 tablet 3   gabapentin  (NEURONTIN ) 600 MG tablet Take 1 tablet (600 mg total) by mouth at bedtime. 30 tablet 0   [Paused] isosorbide  mononitrate (IMDUR ) 30 MG 24 hr tablet Take 1 tablet (30 mg total) by mouth daily. 90 tablet 3   [Paused] metFORMIN  (GLUCOPHAGE ) 1000 MG tablet Take 1,000 mg by mouth 2 (two) times daily.     [Paused] metoprolol  succinate (TOPROL -XL) 50 MG 24 hr tablet Take 50 mg by mouth daily.     Multiple Vitamin (MULTIVITAMIN) capsule Take 1 capsule by mouth daily.     nitroGLYCERIN  (NITROSTAT ) 0.4 MG SL tablet Place 1 tablet (0.4 mg total) under the tongue every 5 (five) minutes as needed for chest pain. 25 tablet 3   [Paused] olmesartan  (BENICAR ) 20 MG tablet Take 20 mg by mouth daily.     oxyCODONE  (OXY IR/ROXICODONE ) 5 MG immediate release tablet Take 5 mg by mouth 4 (four) times daily as needed.     No current facility-administered medications on file prior to visit.  Review of Systems: ROS negative except for as is noted on the assessment and plan.  Objective:   Vitals:   09/25/23 1438  BP: (!) 169/71  Pulse: 74  Temp: 98.1 F (36.7 C)  TempSrc: Oral  SpO2: 100%  Weight: 182 lb (82.6 kg)  Height: 5' 6 (1.676 m)   Physical Exam: Constitutional: well-appearing, in no acute distress Cardiovascular: regular rate and rhythm Pulmonary/Chest: normal work of breathing on room air MSK: normal bulk and tone  Assessment & Plan:   Hyperkalemia Patient presents to the clinic for a hospital follow up appointment after recent admission for hyperkalemia and AKI on CKD. Lab abnormalities were attributed to both prerenal and intrarenal etiologies as well as polypharmacy. Lasix , metoprolol , metformin , Imdur , olmesartan  were held at discharge. Since discharge  patient has been feeling well with minimal symptoms - Repeat BMP today. Will call patient with results and restart medications based on results - Instructed patient to restart metoprolol  for blood pressure control, and Imdur  for combined BP control and vasodilatory effects due to recent episode of chest pain.   Atypical chest pain Patient reports an episode of chest pain earlier this week. She was meeting with a friend who told her about a death in the family. Shortly after she began experiencing substernal chest pain, which resolved with nitroglycerin  at home.  EKG taken in clinic showed sinus arrhythmia with nonspecific ischemic changes and PVCs. Patient has cardiology follow up already scheduled. Recommended keeping the appointment, and restart Imdur  for vasodilatory effect. Return precautions if chest pain returns/persists.   Essential hypertension Will plan for nurse only BP visit next week. Restarting metoprolol  and Imdur  today. Will consider restarting other medications based on lab results and BP next week. Other medical issues to be followed up at next visit.     Patient discussed with Dr. Frankie Israel [name] [facility] Internal Medicine  PGY-1 Pager: [phone] Date 09/26/2023  Time 6:42 PM

## 2023-09-25 NOTE — Patient Instructions (Addendum)
 Ms Debbie [name], Debbie [name] can restart taking your metoprolol  today. I will call you to discuss when you can restart the rest of your medications, and with the results of the bloodwork we are doing today.   I would like you to come back to the clinic in about a week to quickly check your blood pressure, and then again in about 6 weeks for another appointment.   Thanks,  Dr Esaw Heckler

## 2023-09-26 ENCOUNTER — Encounter: Payer: Self-pay | Admitting: Internal Medicine

## 2023-09-26 LAB — BMP8+ANION GAP
Anion Gap: 18 mmol/L (ref 10.0–18.0)
BUN/Creatinine Ratio: 14 (ref 12–28)
BUN: 21 mg/dL (ref 8–27)
CO2: 19 mmol/L — ABNORMAL LOW (ref 20–29)
Calcium: 9.6 mg/dL (ref 8.7–10.3)
Chloride: 107 mmol/L — ABNORMAL HIGH (ref 96–106)
Creatinine, Ser: 1.49 mg/dL — ABNORMAL HIGH (ref 0.57–1.00)
Glucose: 98 mg/dL (ref 70–99)
Potassium: 3.9 mmol/L (ref 3.5–5.2)
Sodium: 144 mmol/L (ref 134–144)
eGFR: 36 mL/min/{1.73_m2} — ABNORMAL LOW (ref >59)

## 2023-09-26 NOTE — Assessment & Plan Note (Addendum)
 Patient presents to the clinic for a hospital follow up appointment after recent admission for hyperkalemia and AKI on CKD. Lab abnormalities were attributed to both prerenal and intrarenal etiologies as well as polypharmacy. Lasix , metoprolol , metformin , Imdur , olmesartan  were held at discharge. Since discharge patient has been feeling well with minimal symptoms - Repeat BMP today. Will call patient with results and restart medications based on results - Instructed patient to restart metoprolol  for blood pressure control, and Imdur  for combined BP control and vasodilatory effects due to recent episode of chest pain.

## 2023-09-26 NOTE — Assessment & Plan Note (Addendum)
 Patient reports an episode of chest pain earlier this week. She was meeting with a friend who told her about a death in the family. Shortly after she began experiencing substernal chest pain, which resolved with nitroglycerin  at home.  EKG taken in clinic showed sinus arrhythmia with nonspecific ischemic changes and PVCs. Patient has cardiology follow up already scheduled. Recommended keeping the appointment, and restart Imdur  for vasodilatory effect. Return precautions if chest pain returns/persists.

## 2023-09-26 NOTE — Assessment & Plan Note (Signed)
 Will plan for nurse only BP visit next week. Restarting metoprolol  and Imdur  today. Will consider restarting other medications based on lab results and BP next week. Other medical issues to be followed up at next visit.

## 2023-09-30 NOTE — Progress Notes (Signed)
 Cardiology Office Note    Date:  10/02/2023  ID:  Oval, Moralez 1946/03/05, MRN [id number] PCP:  [name], MD  Cardiologist:  [name], MD  Electrophysiologist:  None   Chief Complaint: Follow up for hypertension   History of Present Illness: Aaron Aas    Miyo Aina is a 78 y.o. female with visit-pertinent history of CAD s/p DES x 3 to the RCA in 2003, s/p DES to o-pRCA and PTCA-mRCA (ISR) in 07/2021, paroxysmal atrial fibrillation on Eliquis , hypertension, hyperlipidemia, stroke, type 2 diabetes and chronic lower back pain.  Echocardiogram in 06/2021 showed EF 55 to 60%, normal LV function, no RWMA, no significant valvular abnormalities.  She was hospitalized in April 2023 in setting of chest pain.  Repeat cardiac catheterization revealed 80% proximal RCA stenosis between the previously placed proximal, mid RCA stent as well as ISR-mRCA stents s/p PTCA, patent dRCA stent.  She had successful PCI to the RCA with insertion of a 2.5 x 18 mm DES and PTCA of the mid in-stent 50 to 60% stenosis.  She was seen in clinic by Dr. Loetta Ringer on 02/04/2023, she remained stable from a cardiac perspective.  Her Plavix  was discontinued and she was instructed to resume aspirin  81 mg to take with Eliquis  5 mg twice a day.  On 03/18/2023 she presented to the emergency room for left-sided chest pain that had started around 830 that morning and lasted until 930.  She stated that was achy associate with the left breast and had no radiation.  She denied any nausea, vomiting, dizziness with this or shortness of breath.  She noted that she had become a little bit sweaty but overall had been doing well.  When evaluated in the ED she was chest pain-free. Troponin 30>>32, EKG without acute changes.  She was discharged in stable condition with recommendation for follow-up with cardiology.  On hospital follow-up on 03/28/2023 patient reported that she was doing well, she denied any further chest discomfort, she felt that  her discomfort was musculoskeletal in nature.  Patient's Imdur  was increased to 30 mg daily for better blood pressure control.  In 05/2023 she was evaluated in the emergency room with hyperkalemia and AKI.  She was treated with IV fluids with improvement in creatinine from 2.0 down to 1.59.  She was last seen in clinic on 06/10/2023 by Dr. Loetta Ringer.  She remained stable from a cardiac standpoint.  On 09/16/2023 cardiology was consulted to evaluate patient in setting of chest discomfort.  On interview she reported that she had fallen and hurt her back 3 weeks prior, since then she is experiencing some's of chest pain on and off.  It was noted she did difficult time characterizing pain other than that it was intermittent.  She was unable to recall what symptoms she had prior to her stents in the past.  Reports no benefit from nitroglycerin .  EKG showed sinus bradycardia with no acute ST/T wave changes initial troponin 18 which was noted to be elevated however is actually lower than her typical baseline.  Patient was followed by hospitalist service, noted to have AKI with associated hyperkalemia with recent diarrhea.  Multiple medications were held at discharge including her metformin , Lasix , olmesartan  and Imdur .  Her rosuvastatin  was discontinued and she was started on atorvastatin .  She was seen by resident service on 09/25/2023, restarted on her metoprolol  and Imdur  for improved blood pressure control.  Patient did report 1 episode of chest pain earlier in the week.  Today she presents for follow-up.  She reports that she is doing well.  Notes that she has been somewhat overwhelmed with all of her medication changes.  She does report 1 episode of chest pain over a week ago in setting of emotional stress, notes that her friend's daughter had sadly passed away and she had some slight chest discomfort following and did improve with nitroglycerin .  At time of discomfort she was not taking her isosorbide , was restarted in  the days following.  Patient denies any chest pain, tightness or pressure since the event.  Denies any chest discomfort with exertion, she feels this is more related to her motion than her history of CAD.  She denies any increased shortness of breath, orthopnea or PND.  She does note some intermittent lower extremity edema, none appreciated on exam today.  She denies any increased palpitations, presyncope or syncope.  She reports that she is to see nephrology at the end of the month and is to have a blood pressure check with Dr. Debbi Failing office within the next week.  ROS: .   Today she denies shortness of breath, fatigue, palpitations, melena, hematuria, hemoptysis, diaphoresis, weakness, presyncope, syncope, orthopnea, and PND.  All other systems are reviewed and otherwise negative. Studies Reviewed: Aaron Aas   EKG:  EKG is not ordered today.  CV Studies: Cardiac studies reviewed are outlined and summarized above. Otherwise please see EMR for full report. Cardiac Studies & Procedures   ______________________________________________________________________________________________ CARDIAC CATHETERIZATION  CARDIAC CATHETERIZATION 07/31/2021  Conclusion   Mid RCA lesion is 50% stenosed.   Prox RCA lesion is 80% stenosed.   Previously placed Ost RCA to Prox RCA stent (unknown type) is  widely patent.   Previously placed Dist RCA stent (unknown type) is  widely patent.   A drug-eluting stent was successfully placed.   Post intervention, there is a 0% residual stenosis.   Post intervention, there is a 0% residual stenosis.   Post intervention, there is a 0% residual stenosis.  Normal normal LAD and left circumflex coronary arteries.  The right coronary artery had a previously placed patent proximal stent, 80% stenosis between the proximal and mid stent with 50% in-stent narrowing in the mid stent and a patent distal third stent.  LVEDP 17 mmHg.  Successful percutaneous coronary intervention to the RCA  with insertion of a 2.5 x 18 mm Medtronic Onyx Frontier stent was dilated to 2.76 mm and PTCA of the mid in-stent 50 to 60% stenosis.  RECOMMENDATION: Recommend resumption of Eliquis  commencing tomorrow with initial triple drug therapy for 1 month followed by Plavix /Eliquis .  The patient presented with significant hypertension and will need optimal blood pressure control.  Aggressive lipid-lowering therapy with target LDL less than 70.  Findings Coronary Findings Diagnostic  Dominance: Right  Right Coronary Artery Previously placed Ost RCA to Prox RCA stent (unknown type) is  widely patent. Prox RCA lesion is 80% stenosed. Mid RCA lesion is 50% stenosed. The lesion was previously treated . Previously placed Dist RCA stent (unknown type) is  widely patent.  Intervention  Ost RCA to Prox RCA lesion Stent (Also treats lesions: Prox RCA, and Mid RCA) Lesion crossed with guidewire. Pre-stent angioplasty was performed. A drug-eluting stent was successfully placed. Post-Intervention Lesion Assessment The intervention was successful. Pre-interventional TIMI flow is 3. Post-intervention TIMI flow is 3. There is a 0% residual stenosis post intervention.  Prox RCA lesion Stent (Also treats lesions: Ost RCA to Prox RCA, and Mid RCA) See details in  Ost RCA to Prox RCA lesion. Post-Intervention Lesion Assessment The intervention was successful. Pre-interventional TIMI flow is 3. Post-intervention TIMI flow is 3. No complications occurred at this lesion. There is a 0% residual stenosis post intervention.  Mid RCA lesion Stent (Also treats lesions: Ost RCA to Prox RCA, and Prox RCA) See details in Ost RCA to Prox RCA lesion. Post-Intervention Lesion Assessment The intervention was successful. Pre-interventional TIMI flow is 3. Post-intervention TIMI flow is 3. No complications occurred at this lesion. There is a 0% residual stenosis post intervention.   STRESS TESTS  MYOCARDIAL PERFUSION  IMAGING 01/19/2016  Narrative  The left ventricular ejection fraction is normal (55-65%).  There was no ST segment deviation noted during stress.  The study is normal.  This is a low risk study.  Normal resting and stress perfusion. No ischemia or infarction EF 58%   ECHOCARDIOGRAM  ECHOCARDIOGRAM COMPLETE 07/09/2021  Narrative ECHOCARDIOGRAM REPORT    Patient Name:   DAILIN SOSNOWSKI [facility] Date of Exam: 07/09/2021 Medical Rec #:  [id number]       Height:       60.5 in Accession #:    [id number]      Weight:       197.5 lb Date of Birth:  [birth year]        BSA:          1.868 m Patient Age:    76 years        BP:           100/62 mmHg Patient Gender: F               HR:           75 bpm. Exam Location:  Inpatient  Procedure: 2D Echo, Cardiac Doppler and Color Doppler  Indications:    I 38 ENDOCARDITIS  History:        Patient has prior history of Echocardiogram examinations, most recent 02/06/2021. CHF, CAD; Risk Factors:Hypertension, Dyslipidemia and Diabetes. 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  Sonographer:    Margaret Sharp Referring Phys: [id number] Merrill Abide SHALHOUB  IMPRESSIONS   1. Left ventricular ejection fraction, by estimation, is 55 to 60%. The left ventricle has normal function. The left ventricle has no regional wall motion abnormalities. Left ventricular diastolic function could not be evaluated. 2. Right ventricular systolic function is normal. The right ventricular size is normal. 3. The mitral valve is normal in structure. Trivial mitral valve regurgitation. No evidence of mitral stenosis. 4. The aortic valve is tricuspid. Aortic valve regurgitation is not visualized. No aortic stenosis is present. 5. The inferior vena cava is normal in size with greater than 50% respiratory variability, suggesting right atrial pressure of 3 mmHg.  Comparison(s): No prior Echocardiogram.  FINDINGS Left Ventricle: Left ventricular ejection  fraction, by estimation, is 55 to 60%. The left ventricle has normal function. The left ventricle has no regional wall motion abnormalities. The left ventricular internal cavity size was normal in size. There is no left ventricular hypertrophy. Left ventricular diastolic function could not be evaluated.  Right Ventricle: The right ventricular size is normal. Right ventricular systolic function is normal.  Left Atrium: Left atrial size was normal in size.  Right Atrium: Right atrial size was normal in size.  Pericardium: Trivial pericardial effusion is present.  Mitral Valve: The mitral valve is normal in structure. Trivial mitral valve regurgitation. No evidence of mitral valve stenosis.  Tricuspid Valve: The tricuspid valve is normal  in structure. Tricuspid valve regurgitation is trivial. No evidence of tricuspid stenosis.  Aortic Valve: The aortic valve is tricuspid. Aortic valve regurgitation is not visualized. No aortic stenosis is present. Aortic valve mean gradient measures 7.0 mmHg. Aortic valve peak gradient measures 11.8 mmHg.  Pulmonic Valve: The pulmonic valve was normal in structure. Pulmonic valve regurgitation is trivial. No evidence of pulmonic stenosis.  Aorta: The aortic root is normal in size and structure.  Venous: The inferior vena cava is normal in size with greater than 50% respiratory variability, suggesting right atrial pressure of 3 mmHg.  IAS/Shunts: No atrial level shunt detected by color flow Doppler.   LEFT VENTRICLE PLAX 2D LVIDd:         5.00 cm LVIDs:         2.70 cm LV PW:         1.00 cm LV IVS:        0.90 cm LVOT diam:     1.70 cm LV SV:         35 LV SV Index:   19 LVOT Area:     2.27 cm  LV Volumes (MOD) LV vol d, MOD A2C: 60.1 ml LV vol d, MOD A4C: 85.4 ml LV vol s, MOD A2C: 31.6 ml LV vol s, MOD A4C: 49.2 ml LV SV MOD A2C:     28.5 ml LV SV MOD A4C:     85.4 ml LV SV MOD BP:      31.4 ml  RIGHT VENTRICLE             IVC RV S  prime:     16.60 cm/s  IVC diam: 2.20 cm RVOT diam:      2.20 cm TAPSE (M-mode): 3.0 cm  LEFT ATRIUM             Index        RIGHT ATRIUM           Index LA diam:        4.10 cm 2.19 cm/m   RA Area:     11.00 cm LA Vol (A2C):   53.9 ml 28.85 ml/m  RA Volume:   21.80 ml  11.67 ml/m LA Vol (A4C):   52.4 ml 28.05 ml/m LA Biplane Vol: 53.5 ml 28.64 ml/m AORTIC VALVE                     PULMONIC VALVE AV Area (Vmax):    1.06 cm      PV Area (Vmax):  5.31 cm AV Area (Vmean):   1.01 cm      PV Area (Vmean): 5.22 cm AV Area (VTI):     1.09 cm      PV Area (VTI):   4.60 cm AV Vmax:           172.00 cm/s   PV Vmax:         0.77 m/s AV Vmean:          118.000 cm/s  PV Vmean:        57.700 cm/s AV VTI:            0.322 m       PV VTI:          0.218 m AV Peak Grad:      11.8 mmHg     PV Peak grad:    2.4 mmHg AV Mean Grad:      7.0 mmHg  PV Mean grad:    1.0 mmHg LVOT Vmax:         80.40 cm/s    RVOT Peak grad:  5 mmHg LVOT Vmean:        52.500 cm/s LVOT VTI:          0.154 m LVOT/AV VTI ratio: 0.48  AORTA Ao Root diam: 2.50 cm Ao Asc diam:  2.70 cm  MITRAL VALVE                TRICUSPID VALVE MV Area (PHT): 4.06 cm     TR Peak grad:   22.8 mmHg MV Decel Time: 187 msec     TR Vmax:        239.00 cm/s MV E velocity: 111.00 cm/s MV A velocity: 86.95 cm/s   SHUNTS MV E/A ratio:  1.28         Systemic VTI:  0.15 m Systemic Diam: 1.70 cm Pulmonic VTI:  0.264 m Pulmonic Diam: 2.20 cm Qp/Qs:         2.87  Alexandria Angel MD Electronically signed by Alexandria Angel MD Signature Date/Time: 07/09/2021/2:44:24 PM    Final          ______________________________________________________________________________________________       Current Reported Medications:.    Current Meds  Medication Sig   acetaminophen  (TYLENOL ) 500 MG tablet Take 2 tablets (1,000 mg total) by mouth every 8 (eight) hours as needed. (Patient taking differently: Take 1,000 mg by mouth every 8  (eight) hours as needed for moderate pain (pain score 4-6).)   albuterol  (VENTOLIN  HFA) 108 (90 Base) MCG/ACT inhaler Inhale 2 puffs into the lungs every 6 (six) hours as needed for shortness of breath or wheezing.   amLODipine  (NORVASC ) 10 MG tablet Take 1 tablet (10 mg total) by mouth daily.   apixaban  (ELIQUIS ) 5 MG TABS tablet Take 1 tablet (5 mg total) by mouth 2 (two) times daily.   atorvastatin  (LIPITOR ) 80 MG tablet Take 1 tablet (80 mg total) by mouth daily.   carvedilol (COREG) 6.25 MG tablet Take 1 tablet (6.25 mg total) by mouth 2 (two) times daily.   clopidogrel  (PLAVIX ) 75 MG tablet Take 1 tablet (75 mg total) by mouth daily.   estradiol  (ESTRACE ) 0.1 MG/GM vaginal cream Place 0.5 g vaginally 2 (two) times a week. Place 0.5g nightly for two weeks then twice a week after   Evolocumab  (REPATHA  SURECLICK) 140 MG/ML SOAJ Inject 140 mg into the skin every 14 (fourteen) days.   ezetimibe  (ZETIA ) 10 MG tablet Take 1 tablet (10 mg total) by mouth daily.   isosorbide  mononitrate (IMDUR ) 60 MG 24 hr tablet Take 1 tablet (60 mg total) by mouth daily.   Multiple Vitamin (MULTIVITAMIN) capsule Take 1 capsule by mouth daily.   nitroGLYCERIN  (NITROSTAT ) 0.4 MG SL tablet Place 1 tablet (0.4 mg total) under the tongue every 5 (five) minutes as needed for chest pain.   [DISCONTINUED] isosorbide  mononitrate (IMDUR ) 30 MG 24 hr tablet Take 1 tablet (30 mg total) by mouth daily.   [DISCONTINUED] metoprolol  succinate (TOPROL -XL) 50 MG 24 hr tablet Take 50 mg by mouth daily.    Physical Exam:    VS:  BP (!) 154/72   Pulse 60   Ht 5' 6 (1.676 m)   Wt 183 lb 3.2 oz (83.1 kg)   SpO2 98%   BMI 29.57 kg/m    Wt Readings from Last 3 Encounters:  10/01/23 183 lb 3.2 oz (83.1 kg)  09/25/23 182  lb (82.6 kg)  09/16/23 175 lb (79.4 kg)    GEN: Well nourished, well developed in no acute distress NECK: No JVD; No carotid bruits CARDIAC: RRR, no murmurs, rubs, gallops RESPIRATORY:  Clear to auscultation  without rales, wheezing or rhonchi  ABDOMEN: Soft, non-tender, non-distended EXTREMITIES:  No edema; No acute deformity     Asessement and Plan:.    CAD: S/p DES x 3 to the RCA in 2003 (ost-mid RCA, Dist RCA), s/p DES to RCA between previous proximal and mid stent with PTCA-M RCA for ISR in 07/2021.  Today she reports that she is doing well, she did have an episode of chest discomfort after becoming emotionally upset after finding out the daughter of her friend had sadly passed away, she was not on Imdur  at this time.  On follow-up with her PCP this was restarted, she denies any chest discomfort since.  She denies any chest pain, tightness or pressure, denies any chest discomfort on exertion.  Denies any increased shortness of breath.  Will increase her Imdur  and transition her metoprolol  to carvedilol given ongoing hypertension.  Reviewed ED precautions with patient. Continue amlodipine  10 mg daily, Eliquis  5 mg twice daily, lipitor  80 mg daily, plavix  75 mg daily, Repatha , Zetia  10 mg daily.   Paroxysmal atrial fibrillation: Patient denies any palpitations or feeling of increased heart rates. She denies any bleeding problems on Eliquis . Continue Eliquis  5 mg twice daily.  CHA2DS2-VASc Score = 8 [CHF History: 0, HTN History: 1, Diabetes History: 1, Stroke History: 2, Vascular Disease History: 1, Age Score: 2, Gender Score: 1].  Therefore, the patient's annual risk of stroke is 10.8 %.      Hypertension: Initial blood pressure today 166/68, on recheck was 154/72. Patients Olmesartan  and Lasix  on hold given recurrent AKI's, to see nephrology at the end of the month. Increase Imdur  to 60 mg day and transition from metoprolol  to carvedilol 6.25 mg twice daily. Encouraged patient to monitor her blood pressure at home. She is to have a blood pressure check with Dr. Esaw Heckler in one week, then to have close follow up with cardiology.   Hyperlipidemia: Last lipid profile on 05/20/23 indicated total cholesterol 251,  HDL 82, triglycerides 125, LDL 147. LDL goal less than 55. Avoid rosuvastatin  given fluctuating renal function. Lipoprotein a 490 on 06/10/23. Check fasting lipid profile and LFTs. Continue Repatha , Zetia  and Atorvastatin .   History of CVA: Denies any recurrence. Continue Eliquis , Plavix , Repatha  and atorvastatin .   CKD stage III: Last creatinine 1.49 on 09/25/23. Seeing nephrology at the end of the month. Will hold Olmesartan  pending recommendations from nephrology.    Disposition: F/u with Goldie Tregoning, NP in 3-4 weeks.   Signed, Fayette Gasner D Cornellius Kropp, NP

## 2023-10-01 ENCOUNTER — Other Ambulatory Visit (HOSPITAL_COMMUNITY): Payer: Self-pay

## 2023-10-01 ENCOUNTER — Encounter: Payer: Self-pay | Admitting: Cardiology

## 2023-10-01 ENCOUNTER — Ambulatory Visit: Payer: Self-pay | Admitting: Internal Medicine

## 2023-10-01 ENCOUNTER — Ambulatory Visit: Attending: Cardiology | Admitting: Cardiology

## 2023-10-01 VITALS — BP 154/72 | HR 60 | Ht 66.0 in | Wt 183.2 lb

## 2023-10-01 DIAGNOSIS — Z9861 Coronary angioplasty status: Secondary | ICD-10-CM | POA: Diagnosis not present

## 2023-10-01 DIAGNOSIS — E119 Type 2 diabetes mellitus without complications: Secondary | ICD-10-CM

## 2023-10-01 DIAGNOSIS — I480 Paroxysmal atrial fibrillation: Secondary | ICD-10-CM

## 2023-10-01 DIAGNOSIS — I2510 Atherosclerotic heart disease of native coronary artery without angina pectoris: Secondary | ICD-10-CM

## 2023-10-01 DIAGNOSIS — I10 Essential (primary) hypertension: Secondary | ICD-10-CM

## 2023-10-01 DIAGNOSIS — E785 Hyperlipidemia, unspecified: Secondary | ICD-10-CM | POA: Diagnosis not present

## 2023-10-01 DIAGNOSIS — Z8673 Personal history of transient ischemic attack (TIA), and cerebral infarction without residual deficits: Secondary | ICD-10-CM

## 2023-10-01 DIAGNOSIS — N1832 Chronic kidney disease, stage 3b: Secondary | ICD-10-CM

## 2023-10-01 MED ORDER — CARVEDILOL 6.25 MG PO TABS
6.2500 mg | ORAL_TABLET | Freq: Two times a day (BID) | ORAL | 3 refills | Status: DC
  Filled 2023-10-01: qty 180, 90d supply, fill #0

## 2023-10-01 MED ORDER — ISOSORBIDE MONONITRATE ER 60 MG PO TB24
60.0000 mg | ORAL_TABLET | Freq: Every day | ORAL | 3 refills | Status: AC
  Filled 2023-10-01: qty 90, 90d supply, fill #0
  Filled 2024-03-06: qty 90, 90d supply, fill #1

## 2023-10-01 NOTE — Patient Instructions (Addendum)
 Medication Instructions:  Increase Imdur  to 60 mg once a day  Stop metoprolol  Start Cardvedilol 6.26 mg twice a day  *If you need a refill on your cardiac medications before your next appointment, please call your pharmacy*  Lab Work: Today we are going to draw a Lipid panel and LFT If you have labs (blood work) drawn today and your tests are completely normal, you will receive your results only by: MyChart Message (if you have MyChart) OR A paper copy in the mail If you have any lab test that is abnormal or we need to change your treatment, we will call you to review the results.  Testing/Procedures: Please take your blood pressure daily for 2 weeks and send in a MyChart message. Please include heart rates. (One message at the end of the 2 weeks).   HOW TO TAKE YOUR BLOOD PRESSURE: Rest 5 minutes before taking your blood pressure. Don't smoke or drink caffeinated beverages for at least 30 minutes before. Take your blood pressure before (not after) you eat. Sit comfortably with your back supported and both feet on the floor (don't cross your legs). Elevate your arm to heart level on a table or a desk. Use the proper sized cuff. It should fit smoothly and snugly around your bare upper arm. There should be enough room to slip a fingertip under the cuff. The bottom edge of the cuff should be 1 inch above the crease of the elbow. Ideally, take 3 measurements at one sitting and record the average.  Follow-Up: At [facility], you and your health needs are our priority.  As part of our continuing mission to provide you with exceptional heart care, our providers are all part of one team.  This team includes your primary Cardiologist (physician) and Advanced Practice Providers or APPs (Physician Assistants and Nurse Practitioners) who all work together to provide you with the care you need, when you need it.  Your next appointment:   1 month(s)  Provider:   Katlyn West, NP, Then,  Peter [place] MD will plan to see you again in 4-5 month(s).   We recommend signing up for the patient portal called MyChart.  Sign up information is provided on this After Visit Summary.  MyChart is used to connect with patients for Virtual Visits (Telemedicine).  Patients are able to view lab/test results, encounter notes, upcoming appointments, etc.  Non-urgent messages can be sent to your provider as well.   To learn more about what you can do with MyChart, go to [email or web address].

## 2023-10-02 ENCOUNTER — Ambulatory Visit: Payer: Self-pay | Admitting: Cardiology

## 2023-10-02 ENCOUNTER — Encounter: Payer: Self-pay | Admitting: Cardiology

## 2023-10-02 LAB — HEPATIC FUNCTION PANEL
ALT: 23 IU/L (ref 0–32)
AST: 19 IU/L (ref 0–40)
Albumin: 4.2 g/dL (ref 3.8–4.8)
Alkaline Phosphatase: 72 IU/L (ref 44–121)
Bilirubin Total: 0.5 mg/dL (ref 0.0–1.2)
Bilirubin, Direct: 0.16 mg/dL (ref 0.00–0.40)
Total Protein: 6.3 g/dL (ref 6.0–8.5)

## 2023-10-02 LAB — LIPID PANEL
Chol/HDL Ratio: 1.7 ratio (ref 0.0–4.4)
Cholesterol, Total: 105 mg/dL (ref 100–199)
HDL: 61 mg/dL (ref >39)
LDL Chol Calc (NIH): 28 mg/dL (ref 0–99)
Triglycerides: 78 mg/dL (ref 0–149)
VLDL Cholesterol Cal: 16 mg/dL (ref 5–40)

## 2023-10-02 NOTE — Progress Notes (Signed)
 Internal Medicine Clinic Attending  Case discussed with the resident at the time of the visit.  We reviewed the resident's history and exam and pertinent patient test results.  I agree with the assessment, diagnosis, and plan of care documented in the resident's note.

## 2023-10-03 NOTE — Telephone Encounter (Signed)
 Called patient husband advised of below they verbalized understanding \

## 2023-10-03 NOTE — Telephone Encounter (Signed)
-----   Message from Katlyn D West sent at 10/02/2023 10:19 AM EDT ----- Please let Debbie [name] know that her cholesterol is very well controlled. Her liver function is normal. Good results! Continue current medications and follow up as planned.  ----- Message ----- From: Garner Jury Lab Results In Sent: 10/02/2023   3:35 AM EDT To: Katlyn D West, NP

## 2023-10-14 ENCOUNTER — Ambulatory Visit: Admitting: *Deleted

## 2023-10-14 NOTE — Progress Notes (Signed)
    Debbie [name] presented today for blood pressure check. Patient is prescribed blood pressure medications and I confirmed that patient did take their blood pressure medication prior to today's appointment. Blood pressure was taken in the usual and appropriate manner using an automated BP cuff.     Vitals:   10/14/23 0845 10/14/23 0852  BP: (!) 159/59 (!) 154/59      Results of today's visit will be routed to Dr. Amoako for review and further management.

## 2023-10-27 ENCOUNTER — Telehealth: Payer: Self-pay | Admitting: Cardiology

## 2023-10-27 ENCOUNTER — Telehealth: Payer: Self-pay

## 2023-10-27 ENCOUNTER — Ambulatory Visit: Attending: Internal Medicine | Admitting: Internal Medicine

## 2023-10-27 ENCOUNTER — Other Ambulatory Visit: Payer: Self-pay

## 2023-10-27 ENCOUNTER — Other Ambulatory Visit: Payer: Self-pay | Admitting: Cardiology

## 2023-10-27 ENCOUNTER — Encounter: Payer: Self-pay | Admitting: Internal Medicine

## 2023-10-27 ENCOUNTER — Other Ambulatory Visit (HOSPITAL_COMMUNITY): Payer: Self-pay

## 2023-10-27 ENCOUNTER — Other Ambulatory Visit: Payer: Self-pay | Admitting: Student

## 2023-10-27 VITALS — BP 150/80 | HR 60 | Ht 65.5 in | Wt 183.0 lb

## 2023-10-27 DIAGNOSIS — I2510 Atherosclerotic heart disease of native coronary artery without angina pectoris: Secondary | ICD-10-CM

## 2023-10-27 DIAGNOSIS — E1159 Type 2 diabetes mellitus with other circulatory complications: Secondary | ICD-10-CM | POA: Diagnosis not present

## 2023-10-27 DIAGNOSIS — E1169 Type 2 diabetes mellitus with other specified complication: Secondary | ICD-10-CM

## 2023-10-27 DIAGNOSIS — R600 Localized edema: Secondary | ICD-10-CM

## 2023-10-27 DIAGNOSIS — I480 Paroxysmal atrial fibrillation: Secondary | ICD-10-CM

## 2023-10-27 DIAGNOSIS — I152 Hypertension secondary to endocrine disorders: Secondary | ICD-10-CM

## 2023-10-27 DIAGNOSIS — E1122 Type 2 diabetes mellitus with diabetic chronic kidney disease: Secondary | ICD-10-CM

## 2023-10-27 DIAGNOSIS — Z9861 Coronary angioplasty status: Secondary | ICD-10-CM | POA: Diagnosis not present

## 2023-10-27 DIAGNOSIS — N184 Chronic kidney disease, stage 4 (severe): Secondary | ICD-10-CM

## 2023-10-27 DIAGNOSIS — E785 Hyperlipidemia, unspecified: Secondary | ICD-10-CM

## 2023-10-27 DIAGNOSIS — E118 Type 2 diabetes mellitus with unspecified complications: Secondary | ICD-10-CM

## 2023-10-27 DIAGNOSIS — I700 Atherosclerosis of aorta: Secondary | ICD-10-CM

## 2023-10-27 DIAGNOSIS — Z6829 Body mass index (BMI) 29.0-29.9, adult: Secondary | ICD-10-CM

## 2023-10-27 MED ORDER — OLMESARTAN MEDOXOMIL 40 MG PO TABS
40.0000 mg | ORAL_TABLET | Freq: Every day | ORAL | 3 refills | Status: AC
  Filled 2023-10-27: qty 90, 90d supply, fill #0
  Filled 2024-02-13: qty 90, 90d supply, fill #1
  Filled 2024-05-14: qty 90, 90d supply, fill #2

## 2023-10-27 MED ORDER — EMPAGLIFLOZIN 10 MG PO TABS
10.0000 mg | ORAL_TABLET | Freq: Every day | ORAL | 0 refills | Status: AC
  Filled 2023-10-27: qty 28, 28d supply, fill #0

## 2023-10-27 MED ORDER — EMPAGLIFLOZIN 10 MG PO TABS: 10.0000 mg | ORAL_TABLET | Freq: Every day | ORAL | Status: AC

## 2023-10-27 MED ORDER — EMPAGLIFLOZIN 10 MG PO TABS
10.0000 mg | ORAL_TABLET | Freq: Every day | ORAL | 3 refills | Status: AC
  Filled 2023-10-27: qty 90, 90d supply, fill #0
  Filled 2024-01-12: qty 90, 90d supply, fill #1
  Filled 2024-05-16: qty 90, 90d supply, fill #2
  Filled 2024-05-16: qty 90, 90d supply, fill #0
  Filled 2024-05-19: qty 90, 90d supply, fill #1

## 2023-10-27 NOTE — Addendum Note (Signed)
 Addended by: TAFFY LOVEY KIDD on: 10/27/2023 03:57 PM   Modules accepted: Orders

## 2023-10-27 NOTE — Telephone Encounter (Signed)
 Medication name/dosage: Samples List: Jardiance  10 mg  Administration instructions: 1 tablet by mouth daily  Reason for samples: Reason for samples: new start  Ordering provider: Dr. Wendel  *Once above information entered, route the phone encounter to CV DIV MAG ST SAMPLES and send Teams message to team member assigned to Samples for the day.

## 2023-10-27 NOTE — Patient Instructions (Addendum)
 Medication Instructions:  Your physician has recommended you make the following change in your medication:   1-STOP- Plavix  2-START-Jardiance  10 mg by mouth daily 3-INCREASE Olmasartan 40 mg by mouth daily 4-Please call our office or Mychart us  with your dose of Isosorbide  mononitrate  *If you need a refill on your cardiac medications before your next appointment, please call your pharmacy*  Lab Work: Your physician recommends that you return for lab work in: 1 week for BMET  If you have labs (blood work) drawn today and your tests are completely normal, you will receive your results only by: MyChart Message (if you have MyChart) OR A paper copy in the mail If you have any lab test that is abnormal or we need to change your treatment, we will call you to review the results.  Testing/Procedures: Your physician has requested that you have an echocardiogram. Echocardiography is a painless test that uses sound waves to create images of your heart. It provides your doctor with information about the size and shape of your heart and how well your heart's chambers and valves are working. This procedure takes approximately one hour. There are no restrictions for this procedure. Please do NOT wear cologne, perfume, aftershave, or lotions (deodorant is allowed). Please arrive 15 minutes prior to your appointment time.  Please note: We ask at that you not bring children with you during ultrasound (echo/ vascular) testing. Due to room size and safety concerns, children are not allowed in the ultrasound rooms during exams. Our front office staff cannot provide observation of children in our lobby area while testing is being conducted. An adult accompanying a patient to their appointment will only be allowed in the ultrasound room at the discretion of the ultrasound technician under special circumstances. We apologize for any inconvenience. Your physician has requested that you have a lower extremity  arterial duplex. This test is an ultrasound of the arteries in the legs. It looks at arterial blood flow in the legs. Allow one hour for Lower Arterial scans. There are no restrictions or special instructions.  Please note: We ask at that you not bring children with you during ultrasound (echo/ vascular) testing. Due to room size and safety concerns, children are not allowed in the ultrasound rooms during exams. Our front office staff cannot provide observation of children in our lobby area while testing is being conducted. An adult accompanying a patient to their appointment will only be allowed in the ultrasound room at the discretion of the ultrasound technician under special circumstances. We apologize for any inconvenience.  Your physician has requested that you have an ankle brachial index (ABI). During this test an ultrasound and blood pressure cuff are used to evaluate the arteries that supply the arms and legs with blood. Allow thirty minutes for this exam. There are no restrictions or special instructions.  Please note: We ask at that you not bring children with you during ultrasound (echo/ vascular) testing. Due to room size and safety concerns, children are not allowed in the ultrasound rooms during exams. Our front office staff cannot provide observation of children in our lobby area while testing is being conducted. An adult accompanying a patient to their appointment will only be allowed in the ultrasound room at the discretion of the ultrasound technician under special circumstances. We apologize for any inconvenience.  Follow-Up: At [facility], you and your health needs are our priority.  As part of our continuing mission to provide you with exceptional heart care, our  providers are all part of one team.  This team includes your primary Cardiologist (physician) and Advanced Practice Providers or APPs (Physician Assistants and Nurse Practitioners) who all work together to provide  you with the care you need, when you need it.  Your next appointment:   6 month(s)  Provider:   PA or NP  We recommend signing up for the patient portal called MyChart.  Sign up information is provided on this After Visit Summary.  MyChart is used to connect with patients for Virtual Visits (Telemedicine).  Patients are able to view lab/test results, encounter notes, upcoming appointments, etc.  Non-urgent messages can be sent to your provider as well.   To learn more about what you can do with MyChart, go to [email or web address].   You have been referred to Pharmacist.

## 2023-10-27 NOTE — Progress Notes (Addendum)
 Cardiology Office Note:   Date:  10/27/2023  ID:  Ronal Addie Rocks, DOB Dec 20, 1945, MRN [id number] PCP:  Renne Homans, MD  [facility] Providers Cardiologist:  Wendel Haws, MD Referring MD: Renne Homans, MD  Chief Complaint/Reason for Referral: DOD appointment for elevated blood pressure and lower extremity edema ASSESSMENT:    1. CAD S/P percutaneous coronary angioplasty   2. PAF (paroxysmal atrial fibrillation) (HCC)   3. Type 2 diabetes mellitus with complication, without long-term current use of insulin  (HCC)   4. Hypertension associated with diabetes (HCC)   5. Hyperlipidemia associated with type 2 diabetes mellitus (HCC)   6. CKD stage 4 due to type 2 diabetes mellitus (HCC)   7. Aortic atherosclerosis (HCC)   8. BMI 29.0-29.9,adult   9. Lower extremity edema     PLAN:   In order of problems listed above: Coronary artery disease: Patient's PCI was in 2023.  Stop Plavix  75 mg and continue Eliquis  5 mg twice daily; continue atorvastatin  80 mg,  Zetia  10 mg, Imdur  60 mg, and as needed nitroglycerin . PAF: Continue Eliquis  5 mg twice daily and Coreg  6.25 mg twice daily. T2DM: Continue Eliquis  5 mg twice daily, olmesartan  20 mg, atorvastatin  80 mg, and start Jardiance  10 mg daily. Hypertension: Continue Coreg  6.25 mg, increase olmesartan  to 40 mg, continue amlodipine  10mg . Check BMP next week Hyperlipidemia: Decrease atorvastatin  to 20mg  given that she is on Repatha , Repatha  140 mg every 2 weeks, and Zetia  10 mg; LDL recently was 28 which is at goal especially given elevated LP(a). CKD stage IV: Increase olmesartan  20 mg and start Jardiance  10 mg for renal protection. Aortic atherosclerosis: Continue Eliquis  5 mg twice daily, atorvastatin  80 mg, Repatha  140 mg every 2 weeks, and Zetia  10 mg daily with strict blood pressure control. Elevated BMI: Will refer to pharmacy for recommendations regarding GLP-1 receptor agonist therapy. Lower extremity edema: Will obtain LE dopplers  and TTE.  Does not seem to be limited to ankles (amlodipine ). May be due to venous insufficiency.            Dispo:  Return in about 6 months (around 04/28/2024).      Medication Adjustments/Labs and Tests Ordered: Current medicines are reviewed at length with the patient today.  Concerns regarding medicines are outlined above.  The following changes have been made:     Labs/tests ordered: Orders Placed This Encounter  Procedures   Basic Metabolic Panel (BMET)   AMB Referral to [facility]   ECHOCARDIOGRAM COMPLETE   VAS US  LOWER EXTREMITY ARTERIAL DUPLEX   VAS US  ABI WITH/WO TBI    Medication Changes: Meds ordered this encounter  Medications   olmesartan  (BENICAR ) 40 MG tablet    Sig: Take 1 tablet (40 mg total) by mouth daily.    Dispense:  90 tablet    Refill:  3   empagliflozin  (JARDIANCE ) 10 MG TABS tablet    Sig: Take 1 tablet (10 mg total) by mouth daily before breakfast.    Dispense:  90 tablet    Refill:  3    Current medicines are reviewed at length with the patient today.  The patient does not have concerns regarding medicines.  I spent 38 minutes reviewing all clinical data during and prior to this visit including all relevant imaging studies, laboratories, clinical information from other health systems and prior notes from both Cardiology and other specialties, interviewing the patient, conducting a complete physical examination, and coordinating care in order to formulate a comprehensive and  personalized evaluation and treatment plan.   History of Present Illness:    FOCUSED PROBLEM LIST:   CAD PCI DES x 1 pRCA, DES x 1 mRCA, DES x 1 dRCA 2003 PTCA mid RCA ISR and PCI DES mid RCA (between pRCA and mRCA stents) 2023 PAF CV 2 score 8 On Eliquis  T2DM On metformin  Hypertension Hyperlipidemia LP(a) 490 Aortic atherosclerosis CT renal stone 2025 CKD stage IV BMI 11 November 2023:  Patient consents to use of AI scribe. The patient is a  78 year old female with the above listed medical problems seen for expedited DOD appointment today due to elevated blood pressure and peripheral edema.  The patient had been seen last month for routine cardiology follow-up.  She was doing well and had had 1 episode of chest pain associated with emotional stress.  Her blood pressure was elevated at that visit and her Imdur  was increased to 60 mg  She has experienced elevated blood pressure and swelling in her legs for the past week. The swelling is unusual for her and has not improved with any measures. She has gained approximately ten pounds recently. No chest pain or shortness of breath. She denies any bloating.  Her medical history includes coronary artery disease with stents placed on two occasions, most recently in 2023, and atrial fibrillation managed with Eliquis . She also has diabetes managed with metformin  and chronic kidney disease. She is not on insulin  and has an upcoming appointment with a kidney specialist.  Her current medications include Imdur , though she is unsure of the dose, atorvastatin  80 mg, and Repatha . She mentions a history of urinary incontinence but denies recent urinary tract infections. She reports that she keeps her feet elevated.  She is unsure about whether she is taking 60 mg of Imdur  or not.     Current Medications: Current Meds  Medication Sig   acetaminophen  (TYLENOL ) 500 MG tablet Take 2 tablets (1,000 mg total) by mouth every 8 (eight) hours as needed. (Patient taking differently: Take 1,000 mg by mouth every 8 (eight) hours as needed for moderate pain (pain score 4-6).)   albuterol  (VENTOLIN  HFA) 108 (90 Base) MCG/ACT inhaler Inhale 2 puffs into the lungs every 6 (six) hours as needed for shortness of breath or wheezing.   amLODipine  (NORVASC ) 10 MG tablet Take 1 tablet (10 mg total) by mouth daily.   apixaban  (ELIQUIS ) 5 MG TABS tablet Take 1 tablet (5 mg total) by mouth 2 (two) times daily.   carvedilol   (COREG ) 6.25 MG tablet Take 1 tablet (6.25 mg total) by mouth 2 (two) times daily.   empagliflozin  (JARDIANCE ) 10 MG TABS tablet Take 1 tablet (10 mg total) by mouth daily before breakfast.   estradiol  (ESTRACE ) 0.1 MG/GM vaginal cream Place 0.5 g vaginally 2 (two) times a week. Place 0.5g nightly for two weeks then twice a week after   Evolocumab  (REPATHA  SURECLICK) 140 MG/ML SOAJ Inject 140 mg into the skin every 14 (fourteen) days.   isosorbide  mononitrate (IMDUR ) 60 MG 24 hr tablet Take 1 tablet (60 mg total) by mouth daily.   Multiple Vitamin (MULTIVITAMIN) capsule Take 1 capsule by mouth daily.   nitroGLYCERIN  (NITROSTAT ) 0.4 MG SL tablet Place 1 tablet (0.4 mg total) under the tongue every 5 (five) minutes as needed for chest pain.   [DISCONTINUED] clopidogrel  (PLAVIX ) 75 MG tablet Take 1 tablet (75 mg total) by mouth daily.     Review of Systems:   Please see the history of present illness.  All other systems reviewed and are negative.     EKGs/Labs/Other Test Reviewed:   EKG: EKG June 2025 demonstrates sinus rhythm with inferior infarction pattern, PACs, and PVCs  EKG Interpretation Date/Time:    Ventricular Rate:    PR Interval:    QRS Duration:    QT Interval:    QTC Calculation:   R Axis:      Text Interpretation:           Risk Assessment/Calculations:    CHA2DS2-VASc Score = 8   This indicates a 10.8% annual risk of stroke. The patient's score is based upon: CHF History: 0 HTN History: 1 Diabetes History: 1 Stroke History: 2 Vascular Disease History: 1 Age Score: 2 Gender Score: 1         Physical Exam:   VS:  BP (!) 150/80   Pulse 60   Ht 5' 5.5 (1.664 m)   Wt 183 lb (83 kg)   SpO2 99%   BMI 29.99 kg/m    HYPERTENSION CONTROL Vitals:   10/27/23 1450 10/27/23 1521  BP: (!) 155/69 (!) 150/80    The patient's blood pressure is elevated above target today.  In order to address the patient's elevated BP: A current anti-hypertensive  medication was adjusted today.      Wt Readings from Last 3 Encounters:  10/27/23 183 lb (83 kg)  10/01/23 183 lb 3.2 oz (83.1 kg)  09/25/23 182 lb (82.6 kg)      GENERAL:  No apparent distress, AOx3 HEENT:  No carotid bruits, +2 carotid impulses, no scleral icterus CAR: RRR no murmurs, gallops, rubs, or thrills RES:  Clear to auscultation bilaterally ABD:  Soft, nontender, nondistended, positive bowel sounds x 4 VASC:  +2 radial pulses, +2 carotid pulses NEURO:  CN 2-12 grossly intact; motor and sensory grossly intact PSYCH:  No active depression or anxiety EXT:  +1 edema, without ecchymosis, or cyanosis  Signed, Shellby Schlink K Harleigh Civello, MD  10/27/2023 3:40 PM    [facility] [address] [place], [place], [place]  [address] Phone: [phone]; Fax: [phone]   Note:  This document was prepared using Dragon voice recognition software and may include unintentional dictation errors.

## 2023-10-27 NOTE — Telephone Encounter (Signed)
 Called patient this afternoon. Patient is taking Imdur  60 mg.

## 2023-10-28 ENCOUNTER — Other Ambulatory Visit (HOSPITAL_COMMUNITY): Payer: Self-pay

## 2023-10-28 MED ORDER — AMLODIPINE BESYLATE 10 MG PO TABS
10.0000 mg | ORAL_TABLET | Freq: Every day | ORAL | 3 refills | Status: AC
  Filled 2023-10-28: qty 90, 90d supply, fill #0
  Filled 2024-02-02: qty 90, 90d supply, fill #1
  Filled 2024-04-29: qty 90, 90d supply, fill #2

## 2023-10-28 MED ORDER — APIXABAN 5 MG PO TABS
5.0000 mg | ORAL_TABLET | Freq: Two times a day (BID) | ORAL | 3 refills | Status: AC
  Filled 2023-10-28: qty 180, 90d supply, fill #0
  Filled 2024-02-13: qty 180, 90d supply, fill #1
  Filled 2024-05-19: qty 180, 90d supply, fill #2

## 2023-10-28 MED ORDER — ATORVASTATIN CALCIUM 80 MG PO TABS
80.0000 mg | ORAL_TABLET | Freq: Every day | ORAL | 3 refills | Status: AC
  Filled 2023-10-28: qty 90, 90d supply, fill #0
  Filled 2024-01-31: qty 90, 90d supply, fill #1
  Filled 2024-04-27: qty 90, 90d supply, fill #2

## 2023-10-28 MED ORDER — EZETIMIBE 10 MG PO TABS
10.0000 mg | ORAL_TABLET | Freq: Every day | ORAL | 3 refills | Status: AC
  Filled 2023-10-28: qty 90, 90d supply, fill #0
  Filled 2024-02-01: qty 90, 90d supply, fill #1
  Filled 2024-04-27: qty 90, 90d supply, fill #2

## 2023-10-29 ENCOUNTER — Ambulatory Visit

## 2023-10-29 VITALS — Ht 65.5 in | Wt 183.0 lb

## 2023-10-29 DIAGNOSIS — Z0000 Encounter for general adult medical examination without abnormal findings: Secondary | ICD-10-CM

## 2023-10-29 NOTE — Progress Notes (Addendum)
 Because this visit was a virtual/telehealth visit,  certain criteria was not obtained, such a blood pressure, CBG if applicable, and timed get up and go. Any medications not marked as taking were not mentioned during the medication reconciliation part of the visit. Any vitals not documented were not able to be obtained due to this being a telehealth visit or patient was unable to self-report a recent blood pressure reading due to a lack of equipment at home via telehealth. Vitals that have been documented are verbally provided by the patient.   Subjective:   Debbie [name] is a 78 y.o. who presents for a Medicare Wellness preventive visit.  As a reminder, Annual Wellness Visits don't include a physical exam, and some assessments may be limited, especially if this visit is performed virtually. We may recommend an in-person follow-up visit with your provider if needed.  Visit Complete: Virtual I connected with  Debbie [name] on 10/29/23 by a audio enabled telemedicine application and verified that I am speaking with the correct person using two identifiers.  Patient Location: Home  Provider Location: Home Office  I discussed the limitations of evaluation and management by telemedicine. The patient expressed understanding and agreed to proceed.  Vital Signs: Because this visit was a virtual/telehealth visit, some criteria may be missing or patient reported. Any vitals not documented were not able to be obtained and vitals that have been documented are patient reported.  VideoDeclined- This patient declined Librarian, academic. Therefore the visit was completed with audio only.  Persons Participating in Visit: Patient.  AWV Questionnaire: No: Patient Medicare AWV questionnaire was not completed prior to this visit.  Cardiac Risk Factors include: advanced age (>4men, >37 women);diabetes mellitus;dyslipidemia;hypertension;sedentary lifestyle     Objective:     Today's Vitals   10/29/23 1502  Weight: 183 lb (83 kg)  Height: 5' 5.5 (1.664 m)  PainSc: 0-No pain   Body mass index is 29.99 kg/m.     10/29/2023    3:06 PM 09/16/2023    8:39 AM 05/20/2023   10:18 PM 03/18/2023    9:45 AM 12/24/2021   10:14 AM 09/14/2021    2:54 PM 07/25/2021    6:41 PM  Advanced Directives  Does Patient Have a Medical Advance Directive? Yes No Yes No No No Yes  Type of Estate agent of [place];Living will  Living will      Copy of Healthcare Power of Attorney in Chart? No - copy requested        Would patient like information on creating a medical advance directive?  No - Patient declined No - Patient declined No - Patient declined No - Patient declined No - Patient declined     Current Medications (verified) Outpatient Encounter Medications as of 10/29/2023  Medication Sig   acetaminophen  (TYLENOL ) 500 MG tablet Take 2 tablets (1,000 mg total) by mouth every 8 (eight) hours as needed. (Patient taking differently: Take 1,000 mg by mouth every 8 (eight) hours as needed for moderate pain (pain score 4-6).)   albuterol  (VENTOLIN  HFA) 108 (90 Base) MCG/ACT inhaler Inhale 2 puffs into the lungs every 6 (six) hours as needed for shortness of breath or wheezing.   amLODipine  (NORVASC ) 10 MG tablet Take 1 tablet (10 mg total) by mouth daily.   apixaban  (ELIQUIS ) 5 MG TABS tablet Take 1 tablet (5 mg total) by mouth 2 (two) times daily.   atorvastatin  (LIPITOR ) 80 MG tablet Take 1 tablet (80 mg  total) by mouth daily.   carvedilol  (COREG ) 6.25 MG tablet Take 1 tablet (6.25 mg total) by mouth 2 (two) times daily.   empagliflozin  (JARDIANCE ) 10 MG TABS tablet Take 1 tablet (10 mg total) by mouth daily before breakfast.   empagliflozin  (JARDIANCE ) 10 MG TABS tablet Take 1 tablet (10 mg total) by mouth daily before breakfast.   empagliflozin  (JARDIANCE ) 10 MG TABS tablet Take 1 tablet (10 mg total) by mouth daily before breakfast.   estradiol  (ESTRACE ) 0.1  MG/GM vaginal cream Place 0.5 g vaginally 2 (two) times a week. Place 0.5g nightly for two weeks then twice a week after   Evolocumab  (REPATHA  SURECLICK) 140 MG/ML SOAJ Inject 140 mg into the skin every 14 (fourteen) days.   ezetimibe  (ZETIA ) 10 MG tablet Take 1 tablet (10 mg total) by mouth daily.   [Paused] furosemide  (LASIX ) 20 MG tablet Take 1 tablet by mouth once daily (Patient not taking: Reported on 10/27/2023)   isosorbide  mononitrate (IMDUR ) 60 MG 24 hr tablet Take 1 tablet (60 mg total) by mouth daily.   [Paused] metFORMIN  (GLUCOPHAGE ) 1000 MG tablet Take 1,000 mg by mouth 2 (two) times daily. (Patient not taking: Reported on 10/27/2023)   Multiple Vitamin (MULTIVITAMIN) capsule Take 1 capsule by mouth daily.   nitroGLYCERIN  (NITROSTAT ) 0.4 MG SL tablet Place 1 tablet (0.4 mg total) under the tongue every 5 (five) minutes as needed for chest pain.   olmesartan  (BENICAR ) 40 MG tablet Take 1 tablet (40 mg total) by mouth daily.   No facility-administered encounter medications on file as of 10/29/2023.    Allergies (verified) Fish-derived products   History: Past Medical History:  Diagnosis Date   Arthritis    CAD (coronary artery disease) 11/1999   RCA stent X3, patent '06. Nuc low risk 9/13   Cataract    beginning stages   Diabetes mellitus without complication (HCC)    Dysrhythmia    History of stress test 01/08/2012   Showed minimal anterior thinning not felt to be significant.   Hx of echocardiogram 12/12/2011   EF>55%   Hypercholesteremia    Hypertension    Myocardial infarction [facility])    18 years ago    age 55   PAF (paroxysmal atrial fibrillation) (HCC) 11/2011   SSS component with some bradycardia   Stroke (HCC) 1998   Vitamin D deficiency    Past Surgical History:  Procedure Laterality Date   ABDOMINAL HYSTERECTOMY  2021   CATARACT EXTRACTION, BILATERAL     CHOLECYSTECTOMY     COLONOSCOPY     CORONARY ANGIOGRAM  08/2004   patent stents   CORONARY  ANGIOPLASTY WITH STENT PLACEMENT  11/1999   X 3   CORONARY STENT INTERVENTION N/A 07/31/2021   Procedure: CORONARY STENT INTERVENTION;  Surgeon: Burnard Debby LABOR, MD;  Location: [facility] INVASIVE CV LAB;  Service: Cardiovascular;  Laterality: N/A;   EYE SURGERY     HAMMER TOE SURGERY Left    L 2nd toe   LEFT HEART CATH AND CORONARY ANGIOGRAPHY N/A 07/31/2021   Procedure: LEFT HEART CATH AND CORONARY ANGIOGRAPHY;  Surgeon: Burnard Debby LABOR, MD;  Location: [facility] INVASIVE CV LAB;  Service: Cardiovascular;  Laterality: N/A;   MULTIPLE TOOTH EXTRACTIONS     ROBOTIC ASSISTED TOTAL HYSTERECTOMY WITH BILATERAL SALPINGO OOPHERECTOMY Bilateral 03/29/2020   Procedure: XI ROBOTIC ASSISTED TOTAL HYSTERECTOMY WITH BILATERAL SALPINGO OOPHORECTOMY;  Surgeon: Rosalva Sawyer, MD;  Location: [facility];  Service: Gynecology;  Laterality: Bilateral;  Tracie to RNFA confirmed on  02/16/20 CS   Family History  Problem Relation Age of Onset   Uterine cancer Mother    Diabetes type II Sister    Heart disease Sister    Colon cancer Neg Hx    Colon polyps Neg Hx    Esophageal cancer Neg Hx    Rectal cancer Neg Hx    Stomach cancer Neg Hx    Breast cancer Neg Hx    Bladder Cancer Neg Hx    Social History   Socioeconomic History   Marital status: Married    Spouse name: Elsie   Number of children: 1   Years of education: 13   Highest education level: Not on file  Occupational History   Occupation: Retired    Comment: Engineer, manufacturing at [employer]  Tobacco Use   Smoking status: Former    Current packs/day: 0.00    Types: Cigarettes    Quit date: 2024    Years since quitting: 1.5   Smokeless tobacco: Never   Tobacco comments:    10/29/23: Patient stated that she quit smoking 2024.  Vaping Use   Vaping status: Never Used  Substance and Sexual Activity   Alcohol use: Never   Drug use: No   Sexual activity: Not Currently    Partners: Male  Other Topics Concern   Not on file  Social History Narrative   Right handed    Lives with husband   Caffeine use: Tea twice per week   Social Drivers of Health   Financial Resource Strain: Low Risk  (10/29/2023)   Overall Financial Resource Strain (CARDIA)    Difficulty of Paying Living Expenses: Not hard at all  Food Insecurity: No Food Insecurity (10/29/2023)   Hunger Vital Sign    Worried About Running Out of Food in the Last Year: Never true    Ran Out of Food in the Last Year: Never true  Transportation Needs: No Transportation Needs (10/29/2023)   PRAPARE - Administrator, Civil Service (Medical): No    Lack of Transportation (Non-Medical): No  Physical Activity: Patient Declined (10/29/2023)   Exercise Vital Sign    Days of Exercise per Week: Patient declined    Minutes of Exercise per Session: Patient declined  Stress: No Stress Concern Present (10/29/2023)   [employer] of Occupational Health - Occupational Stress Questionnaire    Feeling of Stress: Not at all  Social Connections: Moderately Integrated (10/29/2023)   Social Connection and Isolation Panel    Frequency of Communication with Friends and Family: More than three times a week    Frequency of Social Gatherings with Friends and Family: Three times a week    Attends Religious Services: More than 4 times per year    Active Member of Clubs or Organizations: No    Attends Banker Meetings: Never    Marital Status: Married    Tobacco Counseling Counseling given: Not Answered Tobacco comments: 10/29/23: Patient stated that she quit smoking 2024.    Clinical Intake:  Pre-visit preparation completed: Yes  Pain : No/denies pain Pain Score: 0-No pain     BMI - recorded: 29.99 Nutritional Status: BMI 25 -29 Overweight Nutritional Risks: None Diabetes: Yes CBG done?: No Did pt. bring in CBG monitor from home?: No  Lab Results  Component Value Date   HGBA1C 6.4 (H) 08/06/2022   HGBA1C 7.1 (H) 07/02/2021   HGBA1C 7.2 (H) 04/28/2019     How often do you  need to  have someone help you when you read instructions, pamphlets, or other written materials from your doctor or pharmacy?: 1 - Never  Interpreter Needed?: No  Information entered by :: Draken Farrior N. Roshad Hack, LPN.   Activities of Daily Living     10/29/2023    3:09 PM 09/16/2023    8:27 PM  In your present state of health, do you have any difficulty performing the following activities:  Hearing? 0 0  Vision? 0 0  Difficulty concentrating or making decisions? 0 0  Walking or climbing stairs? 0   Dressing or bathing? 0   Doing errands, shopping? 0 0  Preparing Food and eating ? N   Using the Toilet? N   In the past six months, have you accidently leaked urine? Y   Do you have problems with loss of bowel control? N   Managing your Medications? N   Managing your Finances? N   Housekeeping or managing your Housekeeping? N     Patient Care Team: Renne Homans, MD as PCP - General (Internal Medicine) Thukkani, Arun K, MD as PCP - Cardiology (Cardiology) Joshua Alm Hamilton, MD as Consulting Physician (Neurosurgery)  I have updated your Care Teams any recent Medical Services you may have received from other providers in the past year.     Assessment:   This is a routine wellness examination for [facility].  Hearing/Vision screen Hearing Screening - Comments:: Denies hearing difficulties.  Vision Screening - Comments:: No eyeglasses - up to date with routine eye exams with [facility]    Goals Addressed             This Visit's Progress    10/19/23: To stay healthy.         Depression Screen     10/29/2023    3:07 PM 09/25/2023    2:43 PM  PHQ 2/9 Scores  PHQ - 2 Score 0 0  PHQ- 9 Score 3     Fall Risk     10/29/2023    3:06 PM  Fall Risk   Falls in the past year? 0  Number falls in past yr: 0  Injury with Fall? 0  Risk for fall due to : No Fall Risks  Follow up Falls evaluation completed    MEDICARE RISK AT HOME:  Medicare Risk at Home Any stairs in or  around the home?: No If so, are there any without handrails?: No Home free of loose throw rugs in walkways, pet beds, electrical cords, etc?: Yes Adequate lighting in your home to reduce risk of falls?: Yes Life alert?: No Use of a cane, walker or w/c?: No Grab bars in the bathroom?: Yes Shower chair or bench in shower?: Yes Elevated toilet seat or a handicapped toilet?: Yes  TIMED UP AND GO:  Was the test performed?  No  Cognitive Function: Declined/Normal: No cognitive concerns noted by patient or family. Patient alert, oriented, able to answer questions appropriately and recall recent events. No signs of memory loss or confusion.    10/29/2023    3:08 PM  MMSE - Mini Mental State Exam  Not completed: Unable to complete        10/29/2023    3:15 PM  6CIT Screen  What Year? 0 points  What month? 0 points  What time? 0 points  Count back from 20 0 points  Months in reverse 0 points  Repeat phrase 0 points  Total Score 0 points    Immunizations Immunization History  Administered Date(s) Administered   Influenza, High Dose Seasonal PF 02/07/2018   Influenza,inj,quad, With Preservative 01/13/2017   Influenza-Unspecified 12/23/2013, 03/10/2023   Moderna Sars-Covid-2 Vaccination 05/27/2019, 06/29/2019, 02/07/2020   Pneumococcal Conjugate-13 12/23/2013   RSV,unspecified 10/30/2022   Tdap 05/08/2018   Zoster Recombinant(Shingrix) 02/28/2017, 07/03/2022    Screening Tests Health Maintenance  Topic Date Due   FOOT EXAM  Never done   OPHTHALMOLOGY EXAM  Never done   Diabetic kidney evaluation - Urine ACR  Never done   Hepatitis C Screening  Never done   DEXA SCAN  Never done   Pneumococcal Vaccine: 50+ Years (2 of 2 - PPSV23, PCV20, or PCV21) 02/17/2014   Colonoscopy  07/17/2022   COVID-19 Vaccine (4 - 2024-25 season) 12/15/2022   HEMOGLOBIN A1C  02/05/2023   INFLUENZA VACCINE  11/14/2023   Diabetic kidney evaluation - eGFR measurement  09/24/2024   Medicare Annual  Wellness (AWV)  10/28/2024   DTaP/Tdap/Td (2 - Td or Tdap) 05/08/2028   Zoster Vaccines- Shingrix  Completed   Hepatitis B Vaccines  Aged Out   HPV VACCINES  Aged Out   Meningococcal B Vaccine  Aged Out    Health Maintenance  Health Maintenance Due  Topic Date Due   FOOT EXAM  Never done   OPHTHALMOLOGY EXAM  Never done   Diabetic kidney evaluation - Urine ACR  Never done   Hepatitis C Screening  Never done   DEXA SCAN  Never done   Pneumococcal Vaccine: 50+ Years (2 of 2 - PPSV23, PCV20, or PCV21) 02/17/2014   Colonoscopy  07/17/2022   COVID-19 Vaccine (4 - 2024-25 season) 12/15/2022   HEMOGLOBIN A1C  02/05/2023   Health Maintenance Items Addressed: Yes Patient aware of current care gaps.  Immunization record was verified by [facility] and updated in patient's chart. Patient is due for the following care gaps: DEXA Scan, Foot Exam, Eye Exam, vaccines and Urine ACR.  Additional Screening:  Vision Screening: Recommended annual ophthalmology exams for early detection of glaucoma and other disorders of the eye. Would you like a referral to an eye doctor? No    Dental Screening: Recommended annual dental exams for proper oral hygiene  Community Resource Referral / Chronic Care Management: CRR required this visit?  No   CCM required this visit?  No   Plan:    I have personally reviewed and noted the following in the patient's chart:   Medical and social history Use of alcohol, tobacco or illicit drugs  Current medications and supplements including opioid prescriptions. Patient is not currently taking opioid prescriptions. Functional ability and status Nutritional status Physical activity Advanced directives List of other physicians Hospitalizations, surgeries, and ER visits in previous 12 months Vitals Screenings to include cognitive, depression, and falls Referrals and appointments  In addition, I have reviewed and discussed with patient certain preventive protocols,  quality metrics, and best practice recommendations. A written personalized care plan for preventive services as well as general preventive health recommendations were provided to patient.   Roz LOISE Fuller, LPN   [id number]/83/7974   After Visit Summary: (MyChart) Due to this being a telephonic visit, the after visit summary with patients personalized plan was offered to patient via MyChart   Notes: Patient aware of current care gaps.  Immunization record was verified by [facility] and updated in patient's chart. Patient is due for the following care gaps: DEXA Scan, Foot Exam, Eye Exam, vaccines and Urine ACR.

## 2023-10-29 NOTE — Patient Instructions (Signed)
 Debbie [name] , Thank you for taking time out of your busy schedule to complete your Annual Wellness Visit with me. I enjoyed our conversation and look forward to speaking with you again next year. I, as well as your care team,  appreciate your ongoing commitment to your health goals. Please review the following plan we discussed and let me know if I can assist you in the future. Your Game plan/ To Do List    Referrals: If you haven't heard from the office you've been referred to, please reach out to them at the phone provided.   Follow up Visits: Next Medicare AWV with our clinical staff: 11/03/2024 at 2:20 p.m. phone visit with Nurse   Have you seen your provider in the last 6 months (3 months if uncontrolled diabetes)? Yes Next Office Visit with your provider: 11/05/2023 at 2:20 p.m. office visit with Dr. Renne  Clinician Recommendations:  Aim for 30 minutes of exercise or brisk walking, 6-8 glasses of water , and 5 servings of fruits and vegetables each day.       This is a list of the screening recommended for you and due dates:  Health Maintenance  Topic Date Due   Complete foot exam   Never done   Eye exam for diabetics  Never done   Yearly kidney health urinalysis for diabetes  Never done   Hepatitis C Screening  Never done   DEXA scan (bone density measurement)  Never done   Pneumococcal Vaccine for age over 63 (2 of 2 - PPSV23, PCV20, or PCV21) 02/17/2014   Zoster (Shingles) Vaccine (2 of 2) 04/25/2017   Colon Cancer Screening  07/17/2022   COVID-19 Vaccine (4 - 2024-25 season) 12/15/2022   Hemoglobin A1C  02/05/2023   Flu Shot  11/14/2023   Yearly kidney function blood test for diabetes  09/24/2024   Medicare Annual Wellness Visit  10/28/2024   DTaP/Tdap/Td vaccine (2 - Td or Tdap) 05/08/2028   Hepatitis B Vaccine  Aged Out   HPV Vaccine  Aged Out   Meningitis B Vaccine  Aged Out    Advanced directives: (Copy Requested) Please bring a copy of your health care power of  attorney and living will to the office to be added to your chart at your convenience. You can mail to [facility] [address] [place], [place] [address] or email to [email or web address] Advance Care Planning is important because it:  [x]  Makes sure you receive the medical care that is consistent with your values, goals, and preferences  [x]  It provides guidance to your family and loved ones and reduces their decisional burden about whether or not they are making the right decisions based on your wishes.  Follow the link provided in your after visit summary or read over the paperwork we have mailed to you to help you started getting your Advance Directives in place. If you need assistance in completing these, please reach out to us  so that we can help you!  See attachments for Preventive Care and Fall Prevention Tips.

## 2023-10-31 ENCOUNTER — Ambulatory Visit: Admitting: Cardiology

## 2023-11-05 ENCOUNTER — Ambulatory Visit: Admitting: Student

## 2023-11-05 ENCOUNTER — Encounter: Payer: Self-pay | Admitting: Student

## 2023-11-05 ENCOUNTER — Other Ambulatory Visit: Payer: Self-pay

## 2023-11-05 VITALS — BP 144/77 | HR 77 | Temp 98.3°F | Ht 65.0 in | Wt 179.0 lb

## 2023-11-05 DIAGNOSIS — E785 Hyperlipidemia, unspecified: Secondary | ICD-10-CM

## 2023-11-05 DIAGNOSIS — E119 Type 2 diabetes mellitus without complications: Secondary | ICD-10-CM | POA: Diagnosis not present

## 2023-11-05 DIAGNOSIS — Z7984 Long term (current) use of oral hypoglycemic drugs: Secondary | ICD-10-CM

## 2023-11-05 DIAGNOSIS — Z23 Encounter for immunization: Secondary | ICD-10-CM | POA: Insufficient documentation

## 2023-11-05 DIAGNOSIS — I10 Essential (primary) hypertension: Secondary | ICD-10-CM

## 2023-11-05 DIAGNOSIS — Z1211 Encounter for screening for malignant neoplasm of colon: Secondary | ICD-10-CM | POA: Insufficient documentation

## 2023-11-05 LAB — POCT GLYCOSYLATED HEMOGLOBIN (HGB A1C): Hemoglobin A1C: 6.1 % — AB (ref 4.0–5.6)

## 2023-11-05 LAB — GLUCOSE, CAPILLARY: Glucose-Capillary: 104 mg/dL — ABNORMAL HIGH (ref 70–99)

## 2023-11-05 NOTE — Assessment & Plan Note (Signed)
 Lab Results  Component Value Date   HGBA1C 6.1 (A) 11/05/2023   HGBA1C 6.4 (H) 08/06/2022   HGBA1C 7.1 (H) 07/02/2021  Debbie [name] has a history of type 2 diabetes, which appears to have been well-controlled over the past year. She is currently taking Jardiance  and otherwise manages her condition through diet and exercise. Her hemoglobin A1c today is 6.1%, which is reassuring and appropriate for her age. A urine microalbumin test and foot exam will be performed today as part of routine diabetic care. We also discussed the importance of regular ophthalmologic evaluations to screen for diabetic retinopathy; however, the patient chose to defer this for now. - Urine microalbumin/creatinine ratio - Foot exam

## 2023-11-05 NOTE — Progress Notes (Signed)
 CC: Routine office follow-up  HPI:  [name] is a 78 y.o. female living with a history stated below and presents today for an office follow-up and to discuss his chronic conditions. Please see problem based assessment and plan for additional details.  Past Medical History:  Diagnosis Date   Arthritis    CAD (coronary artery disease) 11/1999   RCA stent X3, patent '06. Nuc low risk 9/13   Cataract    beginning stages   Diabetes mellitus without complication (HCC)    Dysrhythmia    History of stress test 01/08/2012   Showed minimal anterior thinning not felt to be significant.   Hx of echocardiogram 12/12/2011   EF>55%   Hypercholesteremia    Hypertension    Myocardial infarction [facility])    18 years ago    age 42   PAF (paroxysmal atrial fibrillation) (HCC) 11/2011   SSS component with some bradycardia   Stroke (HCC) 1998   Vitamin D deficiency     Current Outpatient Medications on File Prior to Visit  Medication Sig Dispense Refill   acetaminophen  (TYLENOL ) 500 MG tablet Take 2 tablets (1,000 mg total) by mouth every 8 (eight) hours as needed. (Patient taking differently: Take 1,000 mg by mouth every 8 (eight) hours as needed for moderate pain (pain score 4-6).) 30 tablet 0   albuterol  (VENTOLIN  HFA) 108 (90 Base) MCG/ACT inhaler Inhale 2 puffs into the lungs every 6 (six) hours as needed for shortness of breath or wheezing.     amLODipine  (NORVASC ) 10 MG tablet Take 1 tablet (10 mg total) by mouth daily. 90 tablet 3   apixaban  (ELIQUIS ) 5 MG TABS tablet Take 1 tablet (5 mg total) by mouth 2 (two) times daily. 180 tablet 3   atorvastatin  (LIPITOR ) 80 MG tablet Take 1 tablet (80 mg total) by mouth daily. 90 tablet 3   carvedilol  (COREG ) 6.25 MG tablet Take 1 tablet (6.25 mg total) by mouth 2 (two) times daily. 180 tablet 3   empagliflozin  (JARDIANCE ) 10 MG TABS tablet Take 1 tablet (10 mg total) by mouth daily before breakfast. 90 tablet 3   empagliflozin  (JARDIANCE )  10 MG TABS tablet Take 1 tablet (10 mg total) by mouth daily before breakfast. 28 tablet 0   empagliflozin  (JARDIANCE ) 10 MG TABS tablet Take 1 tablet (10 mg total) by mouth daily before breakfast. 28 tablet    estradiol  (ESTRACE ) 0.1 MG/GM vaginal cream Place 0.5 g vaginally 2 (two) times a week. Place 0.5g nightly for two weeks then twice a week after 30 g 3   Evolocumab  (REPATHA  SURECLICK) 140 MG/ML SOAJ Inject 140 mg into the skin every 14 (fourteen) days. 6 mL 3   ezetimibe  (ZETIA ) 10 MG tablet Take 1 tablet (10 mg total) by mouth daily. 90 tablet 3   [Paused] furosemide  (LASIX ) 20 MG tablet Take 1 tablet by mouth once daily (Patient not taking: Reported on 10/27/2023) 90 tablet 3   isosorbide  mononitrate (IMDUR ) 60 MG 24 hr tablet Take 1 tablet (60 mg total) by mouth daily. 90 tablet 3   [Paused] metFORMIN  (GLUCOPHAGE ) 1000 MG tablet Take 1,000 mg by mouth 2 (two) times daily. (Patient not taking: Reported on 10/27/2023)     Multiple Vitamin (MULTIVITAMIN) capsule Take 1 capsule by mouth daily.     nitroGLYCERIN  (NITROSTAT ) 0.4 MG SL tablet Place 1 tablet (0.4 mg total) under the tongue every 5 (five) minutes as needed for chest pain. 25 tablet 3   olmesartan  (BENICAR )  40 MG tablet Take 1 tablet (40 mg total) by mouth daily. 90 tablet 3   No current facility-administered medications on file prior to visit.    Family History  Problem Relation Age of Onset   Uterine cancer Mother    Diabetes type II Sister    Heart disease Sister    Colon cancer Neg Hx    Colon polyps Neg Hx    Esophageal cancer Neg Hx    Rectal cancer Neg Hx    Stomach cancer Neg Hx    Breast cancer Neg Hx    Bladder Cancer Neg Hx     Social History   Socioeconomic History   Marital status: Married    Spouse name: Elsie   Number of children: 1   Years of education: 13   Highest education level: Not on file  Occupational History   Occupation: Retired    Comment: Engineer, manufacturing at [employer]  Tobacco Use    Smoking status: Former    Current packs/day: 0.00    Types: Cigarettes    Quit date: 2024    Years since quitting: 1.5   Smokeless tobacco: Never   Tobacco comments:    10/29/23: Patient stated that she quit smoking 2024.  Vaping Use   Vaping status: Never Used  Substance and Sexual Activity   Alcohol use: Never   Drug use: No   Sexual activity: Not Currently    Partners: Male  Other Topics Concern   Not on file  Social History Narrative   Right handed   Lives with husband   Caffeine use: Tea twice per week   Social Drivers of Health   Financial Resource Strain: Low Risk  (10/29/2023)   Overall Financial Resource Strain (CARDIA)    Difficulty of Paying Living Expenses: Not hard at all  Food Insecurity: No Food Insecurity (10/29/2023)   Hunger Vital Sign    Worried About Running Out of Food in the Last Year: Never true    Ran Out of Food in the Last Year: Never true  Transportation Needs: No Transportation Needs (10/29/2023)   PRAPARE - Administrator, Civil Service (Medical): No    Lack of Transportation (Non-Medical): No  Physical Activity: Patient Declined (10/29/2023)   Exercise Vital Sign    Days of Exercise per Week: Patient declined    Minutes of Exercise per Session: Patient declined  Stress: No Stress Concern Present (10/29/2023)   [employer] of Occupational Health - Occupational Stress Questionnaire    Feeling of Stress: Not at all  Social Connections: Moderately Integrated (10/29/2023)   Social Connection and Isolation Panel    Frequency of Communication with Friends and Family: More than three times a week    Frequency of Social Gatherings with Friends and Family: Three times a week    Attends Religious Services: More than 4 times per year    Active Member of Clubs or Organizations: No    Attends Banker Meetings: Never    Marital Status: Married  Catering manager Violence: Not At Risk (10/29/2023)   Humiliation, Afraid, Rape, and  Kick questionnaire    Fear of Current or Ex-Partner: No    Emotionally Abused: No    Physically Abused: No    Sexually Abused: No    Review of Systems: ROS negative except for what is noted on the assessment and plan.  Vitals:   11/05/23 1324 11/05/23 1330  BP: (!) 147/75 (!) 144/77  Pulse: ROLLEN)  56 77  Temp: 98.3 F (36.8 C)   TempSrc: Oral   SpO2: 95%   Weight: 179 lb (81.2 kg)   Height: 5' 5 (1.651 m)     Physical Exam: Constitutional: well-appearing woman, sitting in chair Cardiovascular: regular rate and rhythm, no m/r/g Pulmonary/Chest: normal work of breathing on room air Skin: warm and dry Psych: normal mood and behavior  Assessment & Plan:   Type 2 diabetes mellitus without complication, without long-term current use of insulin  (HCC) Lab Results  Component Value Date   HGBA1C 6.1 (A) 11/05/2023   HGBA1C 6.4 (H) 08/06/2022   HGBA1C 7.1 (H) 07/02/2021  Ms. Pink has a history of type 2 diabetes, which appears to have been well-controlled over the past year. She is currently taking Jardiance  and otherwise manages her condition through diet and exercise. Her hemoglobin A1c today is 6.1%, which is reassuring and appropriate for her age. A urine microalbumin test and foot exam will be performed today as part of routine diabetic care. We also discussed the importance of regular ophthalmologic evaluations to screen for diabetic retinopathy; however, the patient chose to defer this for now. - Urine microalbumin/creatinine ratio - Foot exam  Colon cancer screening Ambulatory referral to gastroenterology for colonoscopy to screen for colon cancer.  Essential hypertension BP Readings from Last 3 Encounters:  11/05/23 (!) 144/77  10/27/23 (!) 150/80  10/14/23 (!) 154/59  The patient's blood pressure is not at goal during today's visit. She is currently being managed on a regimen that includes amlodipine , carvedilol  , olmesartan , and isosorbide  mononitrate She has a  follow-up appointment with Melissa D. Maccia, RPh-CPP, on December 09, 2023, for ongoing hypertension management. Given her complex cardiac history and established care with cardiology, I will defer any medication changes at this time. I plan to collaborate with her cardiology team to formulate an optimal approach for achieving better blood pressure control. - Continue current regimen  Hyperlipidemia Ms. Liscano's hyperlipidemia appears to be well-controlled, with a recent LDL of 27 mg/dL as of one month ago. She is currently taking Lipitor  80 mg daily in combination with Repatha . Given her excellent lipid control, I plan to continue her current regimen without changes at this time. - Continue Lipitor  80 mg - Continue Repatha  - Continue Zetia  10 mg     Patient discussed with Dr. Karna Drue Grow, M.D [facility] Phone: [phone] Date 11/05/2023 Time 5:44 PM

## 2023-11-05 NOTE — Assessment & Plan Note (Addendum)
 Ms. Brunell hyperlipidemia appears to be well-controlled, with a recent LDL of 27 mg/dL as of one month ago. She is currently taking Lipitor  80 mg daily in combination with Repatha . Given her excellent lipid control, I plan to continue her current regimen without changes at this time. - Continue Lipitor  80 mg - Continue Repatha  - Continue Zetia  10 mg

## 2023-11-05 NOTE — Assessment & Plan Note (Signed)
 Ambulatory referral to gastroenterology for colonoscopy to screen for colon cancer.

## 2023-11-05 NOTE — Assessment & Plan Note (Signed)
 BP Readings from Last 3 Encounters:  11/05/23 (!) 144/77  10/27/23 (!) 150/80  10/14/23 (!) 154/59  The patient's blood pressure is not at goal during today's visit. She is currently being managed on a regimen that includes amlodipine , carvedilol  , olmesartan , and isosorbide  mononitrate She has a follow-up appointment with Melissa D. Maccia, RPh-CPP, on December 09, 2023, for ongoing hypertension management. Given her complex cardiac history and established care with cardiology, I will defer any medication changes at this time. I plan to collaborate with her cardiology team to formulate an optimal approach for achieving better blood pressure control. - Continue current regimen

## 2023-11-05 NOTE — Patient Instructions (Addendum)
 Thank you, [name] for allowing us  to provide your care today.   We reviewed your chronic conditions during today's visit. I'm glad to see that you're doing well overall.  Please continue taking your medications as discussed. If you have any questions or concerns, don't hesitate to contact the clinic.  I have ordered the following labs for you:  Lab Orders         Glucose, capillary         Microalbumin / Creatinine Urine Ratio         POC Hbg A1C      Tests ordered today:    Referrals ordered today:   Referral Orders         Ambulatory referral to Gastroenterology       I have ordered the following medication/changed the following medications:   Stop the following medications: There are no discontinued medications.   Start the following medications: No orders of the defined types were placed in this encounter.    Follow up: 4 months   Remember:   Should you have any questions or concerns please call the internal medicine clinic at [phone].   Drue Lisa Grow MD 11/05/2023, 2:10 PM   [facility]

## 2023-11-06 ENCOUNTER — Telehealth: Payer: Self-pay | Admitting: *Deleted

## 2023-11-06 LAB — MICROALBUMIN / CREATININE URINE RATIO
Creatinine, Urine: 114.3 mg/dL
Microalb/Creat Ratio: 13 mg/g{creat} (ref 0–29)
Microalbumin, Urine: 14.9 ug/mL

## 2023-11-06 NOTE — Telephone Encounter (Signed)
 Copied from CRM [id number]. Topic: Clinical - Lab/Test Results >> Nov 06, 2023  4:07 PM Debbie [name] wrote: Reason for CRM: Patient states she received a call about her lab results. Checked chart & there were no notes from provider about labs. Please give patient a call to discuss lab results. CB #: [id number].

## 2023-11-07 NOTE — Progress Notes (Signed)
 Internal Medicine Clinic Attending  Case discussed with the resident at the time of the visit.  We reviewed the resident's history and exam and pertinent patient test results.  I agree with the assessment, diagnosis, and plan of care documented in the resident's note.

## 2023-11-07 NOTE — Telephone Encounter (Signed)
 Patient called back to check status of this request. Has appt with kidney specialist on Tuesday and needs urinalysis results. Thank You

## 2023-11-10 NOTE — Telephone Encounter (Signed)
 Patient is calling back to check on her urinalysis lab. Per patient she is needing her lab results before tomorrow. Patient has an appointment with her kidney doctor tomorrow. Explained to the patient that a message was sent back to her provider.

## 2023-11-17 ENCOUNTER — Other Ambulatory Visit (HOSPITAL_COMMUNITY): Payer: Self-pay

## 2023-11-17 MED ORDER — CEPHALEXIN 250 MG PO CAPS
250.0000 mg | ORAL_CAPSULE | Freq: Two times a day (BID) | ORAL | 0 refills | Status: DC
  Filled 2023-11-17: qty 6, 3d supply, fill #0

## 2023-11-27 NOTE — Telephone Encounter (Signed)
 error

## 2023-12-03 ENCOUNTER — Inpatient Hospital Stay (HOSPITAL_BASED_OUTPATIENT_CLINIC_OR_DEPARTMENT_OTHER)
Admission: RE | Admit: 2023-12-03 | Discharge: 2023-12-03 | Discharge status: Home / Self Care | Source: Ambulatory Visit | Attending: Internal Medicine

## 2023-12-03 ENCOUNTER — Ambulatory Visit (HOSPITAL_COMMUNITY)
Admission: RE | Admit: 2023-12-03 | Discharge: 2023-12-03 | Disposition: A | Discharge status: Home / Self Care | Source: Ambulatory Visit | Attending: Internal Medicine | Admitting: Internal Medicine

## 2023-12-03 ENCOUNTER — Ambulatory Visit: Payer: Self-pay | Admitting: Internal Medicine

## 2023-12-03 DIAGNOSIS — E1169 Type 2 diabetes mellitus with other specified complication: Secondary | ICD-10-CM

## 2023-12-03 DIAGNOSIS — R600 Localized edema: Secondary | ICD-10-CM

## 2023-12-03 DIAGNOSIS — Z6829 Body mass index (BMI) 29.0-29.9, adult: Secondary | ICD-10-CM

## 2023-12-03 DIAGNOSIS — I2510 Atherosclerotic heart disease of native coronary artery without angina pectoris: Secondary | ICD-10-CM | POA: Insufficient documentation

## 2023-12-03 DIAGNOSIS — N184 Chronic kidney disease, stage 4 (severe): Secondary | ICD-10-CM | POA: Diagnosis present

## 2023-12-03 DIAGNOSIS — I700 Atherosclerosis of aorta: Secondary | ICD-10-CM

## 2023-12-03 DIAGNOSIS — E118 Type 2 diabetes mellitus with unspecified complications: Secondary | ICD-10-CM | POA: Diagnosis present

## 2023-12-03 DIAGNOSIS — E1122 Type 2 diabetes mellitus with diabetic chronic kidney disease: Secondary | ICD-10-CM | POA: Diagnosis present

## 2023-12-03 DIAGNOSIS — E785 Hyperlipidemia, unspecified: Secondary | ICD-10-CM | POA: Insufficient documentation

## 2023-12-03 DIAGNOSIS — E1159 Type 2 diabetes mellitus with other circulatory complications: Secondary | ICD-10-CM | POA: Diagnosis present

## 2023-12-03 DIAGNOSIS — I480 Paroxysmal atrial fibrillation: Secondary | ICD-10-CM

## 2023-12-03 DIAGNOSIS — I152 Hypertension secondary to endocrine disorders: Secondary | ICD-10-CM | POA: Diagnosis present

## 2023-12-03 DIAGNOSIS — Z9861 Coronary angioplasty status: Secondary | ICD-10-CM | POA: Insufficient documentation

## 2023-12-03 LAB — ECHOCARDIOGRAM COMPLETE
AR max vel: 1.84 cm2
AV Area VTI: 1.7 cm2
AV Area mean vel: 1.69 cm2
AV Mean grad: 5 mmHg
AV Peak grad: 8.5 mmHg
Ao pk vel: 1.46 m/s
Area-P 1/2: 3.48 cm2
S' Lateral: 3.5 cm

## 2023-12-03 LAB — VAS US ABI WITH/WO TBI
Left ABI: 1.05
Right ABI: 1.06

## 2023-12-09 ENCOUNTER — Other Ambulatory Visit (HOSPITAL_COMMUNITY): Payer: Self-pay

## 2023-12-09 ENCOUNTER — Ambulatory Visit: Attending: Cardiology | Admitting: Pharmacist

## 2023-12-09 VITALS — BP 132/62 | HR 54

## 2023-12-09 DIAGNOSIS — I2510 Atherosclerotic heart disease of native coronary artery without angina pectoris: Secondary | ICD-10-CM

## 2023-12-09 DIAGNOSIS — Z9861 Coronary angioplasty status: Secondary | ICD-10-CM

## 2023-12-09 DIAGNOSIS — E785 Hyperlipidemia, unspecified: Secondary | ICD-10-CM

## 2023-12-09 DIAGNOSIS — E782 Mixed hyperlipidemia: Secondary | ICD-10-CM

## 2023-12-09 DIAGNOSIS — E1169 Type 2 diabetes mellitus with other specified complication: Secondary | ICD-10-CM

## 2023-12-09 DIAGNOSIS — I10 Essential (primary) hypertension: Secondary | ICD-10-CM

## 2023-12-09 MED ORDER — REPATHA SURECLICK 140 MG/ML ~~LOC~~ SOAJ
1.0000 mL | SUBCUTANEOUS | 3 refills | Status: AC
  Filled 2023-12-09 – 2024-01-12 (×2): qty 6, 84d supply, fill #0
  Filled 2024-04-05: qty 6, 84d supply, fill #1

## 2023-12-09 NOTE — Assessment & Plan Note (Addendum)
 Assessment: Blood pressure close to goal today in clinic No reliable home readings available  Plan: Continue carvedilol  6.25 mg twice a day, olmesartan  40 mg daily, amlodipine  10 mg daily, isosorbide  60 mg daily Follow-up in a month Patient asked to write down her blood pressure and heart rate readings and bring them to her next visit

## 2023-12-09 NOTE — Patient Instructions (Addendum)
 Your blood pressure goal is < 130/19mmHg   Please write down your home blood pressure and heart rate readings and bring them to your next appointment Continue carvedilol  6.25mg  twice a day, olmesartan  40mg  daily, amlodipine  10mg  daily, isosorbide    Important lifestyle changes to control high blood pressure  Intervention  Effect on the BP   Weight loss Weight loss is one of the most effective lifestyle changes for controlling blood pressure. If you're overweight or obese, losing even a small amount of weight can help reduce blood pressure.    Blood pressure can decrease by 1 millimeter of mercury (mmHg) with each kilogram (about 2.2 pounds) of weight lost.   Exercise regularly As a general goal, aim for 30 minutes of moderate physical activity every day.    Regular physical activity can lower blood pressure by 5 - 8 mmHg.   Eat a healthy diet Eat a diet rich in whole grains, fruits, vegetables, lean meat, and low-fat dairy products. Limit processed foods, saturated fat, and sweets.    A heart-healthy diet can lower high blood pressure by 10 mmHg.   Reduce salt (sodium) in your diet Aim for 000mg  of sodium each day. Avoid deli meats, canned food, and frozen microwave meals which are high in sodium.     Limiting sodium can reduce blood pressure by 5 mmHg.   Limit alcohol One drink equals 12 ounces of beer, 5 ounces of wine, or 1.5 ounces of 80-proof liquor.    Limiting alcohol to < 1 drink a day for women or < 2 drinks a day for men can help lower blood pressure by about 4 mmHg.   To check your pressure at home you will need to:   Sit up in a chair, with feet flat on the floor and back supported. Do not cross your ankles or legs. Rest your left arm so that the cuff is about heart level. If the cuff goes on your upper arm, then just relax your arm on the table, arm of the chair, or your lap. If you have a wrist cuff, hold your wrist against your chest at heart level. Place  the cuff snugly around your arm, about 1 inch above the crease of your elbow. The cords should be inside the groove of your elbow.  Sit quietly, with the cuff in place, for about 5 minutes. Then press the power button to start a reading. Do not talk or move while the reading is taking place.  Record your readings on a sheet of paper. Although most cuffs have a memory, it is often easier to see a pattern developing when the numbers are all in front of you.  You can repeat the reading after 1-3 minutes if it is recommended.   Make sure your bladder is empty and you have not had caffeine or tobacco within the last 30 minutes   Always bring your blood pressure log with you to your appointments. If you have not brought your monitor in to be double checked for accuracy, please bring it to your next appointment.   You can find a list of validated (accurate) blood pressure cuffs at: validatebp.org

## 2023-12-09 NOTE — Assessment & Plan Note (Addendum)
 Assessment: Patient currently on atorvastatin  80 mg daily and Repatha  140 mg every 14 days Reports back pain and cough that she wants to know if this is from her Repatha  Back pain is bothersome but cough is not  Plan: Advised to hold Repatha  1 or 2 doses to see if pain resolves and then rechallenge with Repatha  We will discuss GLP therapy in more detail at next visit

## 2023-12-09 NOTE — Progress Notes (Signed)
 Patient ID: Debbie [name]                 DOB: 05-11-1945                      MRN: [id number]      HPI: Debbie [name] is a 78 y.o. female referred by Dr. Wendel to HTN clinic. PMH is significant for  CAD s/p DES x 3 to the RCA in 2003, s/p DES to o-pRCA and PTCA-mRCA (ISR) in 07/2021, paroxysmal atrial fibrillation on Eliquis , hypertension, hyperlipidemia, stroke, type 2 diabetes and chronic lower back pain.  Seen 10/27/23 for DOD apt for elevated BP. Olmesartan  was increased to 40mg  daily.  She was also referred to discuss GLP-1.  Patient presents today to clinic.  She thought her appointment was later in the day and showed up significantly late for her appointment.  I was able to work her in.  She did not have any home blood pressure readings available.  Reported her home blood pressure being high.  When asked what some of the readings were she said 138/106.  She denies any dizziness, lightheadedness, headache, blurred vision, SOB, swelling.  She knows that she takes 8 medications in the morning but is not entirely sure what they are.  Blood pressure in clinic 132/62.  Patient reports back pain that she wonders if it is from her Repatha .  I advised that she hold the next 1 or 2 doses to see if pain improves and then rechallenged with another injection.  We did briefly discuss GLP-1 therapy.  Her current A1c is well-controlled although her low-dose metformin  has been on hold since her hospital discharge.  Recently started on Jardiance .  She does have diarrhea fairly frequently at baseline.  We discussed the common GI side effects of GLP-1 therapy.  Due to time constraints it was decided to revisit at next visit.   Current HTN meds: carvedilol  6.25mg  twice a day, olmesartan  40mg  daily, amlodipine  10mg  daily, isosorbide  60 mg daily Previously tried: ramipril , metoprolol , hctz BP goal: <130/80  Family History:  Family History  Problem Relation Age of Onset   Uterine cancer Mother     Diabetes type II Sister    Heart disease Sister    Colon cancer Neg Hx    Colon polyps Neg Hx    Esophageal cancer Neg Hx    Rectal cancer Neg Hx    Stomach cancer Neg Hx    Breast cancer Neg Hx    Bladder Cancer Neg Hx     Social History:  Social History   Socioeconomic History   Marital status: Married    Spouse name: Elsie   Number of children: 1   Years of education: 13   Highest education level: Not on file  Occupational History   Occupation: Retired    Comment: Engineer, manufacturing at [employer]  Tobacco Use   Smoking status: Former    Current packs/day: 0.00    Types: Cigarettes    Quit date: 2024    Years since quitting: 1.6   Smokeless tobacco: Never   Tobacco comments:    10/29/23: Patient stated that she quit smoking 2024.  Vaping Use   Vaping status: Never Used  Substance and Sexual Activity   Alcohol use: Never   Drug use: No   Sexual activity: Not Currently    Partners: Male  Other Topics Concern   Not on file  Social History Narrative  Right handed   Lives with husband   Caffeine use: Tea twice per week   Social Drivers of Health   Financial Resource Strain: Low Risk  (10/29/2023)   Overall Financial Resource Strain (CARDIA)    Difficulty of Paying Living Expenses: Not hard at all  Food Insecurity: No Food Insecurity (10/29/2023)   Hunger Vital Sign    Worried About Running Out of Food in the Last Year: Never true    Ran Out of Food in the Last Year: Never true  Transportation Needs: No Transportation Needs (10/29/2023)   PRAPARE - Administrator, Civil Service (Medical): No    Lack of Transportation (Non-Medical): No  Physical Activity: Patient Declined (10/29/2023)   Exercise Vital Sign    Days of Exercise per Week: Patient declined    Minutes of Exercise per Session: Patient declined  Stress: No Stress Concern Present (10/29/2023)   [employer] of Occupational Health - Occupational Stress Questionnaire    Feeling of Stress: Not  at all  Social Connections: Moderately Integrated (10/29/2023)   Social Connection and Isolation Panel    Frequency of Communication with Friends and Family: More than three times a week    Frequency of Social Gatherings with Friends and Family: Three times a week    Attends Religious Services: More than 4 times per year    Active Member of Clubs or Organizations: No    Attends Banker Meetings: Never    Marital Status: Married  Catering manager Violence: Not At Risk (10/29/2023)   Humiliation, Afraid, Rape, and Kick questionnaire    Fear of Current or Ex-Partner: No    Emotionally Abused: No    Physically Abused: No    Sexually Abused: No    Diet:   Exercise:  Was not addressed today  Home BP readings:  Patient unsure of the readings but reports them as high  Wt Readings from Last 3 Encounters:  11/05/23 179 lb (81.2 kg)  10/29/23 183 lb (83 kg)  10/27/23 183 lb (83 kg)   BP Readings from Last 3 Encounters:  12/09/23 132/62  11/05/23 (!) 144/77  10/27/23 (!) 150/80   Pulse Readings from Last 3 Encounters:  12/09/23 (!) 54  11/05/23 77  10/27/23 60    Renal function: CrCl cannot be calculated (Patient's most recent lab result is older than the maximum 21 days allowed.).  Past Medical History:  Diagnosis Date   Arthritis    CAD (coronary artery disease) 11/1999   RCA stent X3, patent '06. Nuc low risk 9/13   Cataract    beginning stages   Diabetes mellitus without complication (HCC)    Dysrhythmia    History of stress test 01/08/2012   Showed minimal anterior thinning not felt to be significant.   Hx of echocardiogram 12/12/2011   EF>55%   Hypercholesteremia    Hypertension    Myocardial infarction [facility])    18 years ago    age 57   PAF (paroxysmal atrial fibrillation) (HCC) 11/2011   SSS component with some bradycardia   Stroke (HCC) 1998   Vitamin D deficiency     Current Outpatient Medications on File Prior to Visit  Medication Sig  Dispense Refill   amLODipine  (NORVASC ) 10 MG tablet Take 1 tablet (10 mg total) by mouth daily. 90 tablet 3   apixaban  (ELIQUIS ) 5 MG TABS tablet Take 1 tablet (5 mg total) by mouth 2 (two) times daily. 180 tablet 3   atorvastatin  (LIPITOR )  80 MG tablet Take 1 tablet (80 mg total) by mouth daily. 90 tablet 3   carvedilol  (COREG ) 6.25 MG tablet Take 1 tablet (6.25 mg total) by mouth 2 (two) times daily. 180 tablet 3   empagliflozin  (JARDIANCE ) 10 MG TABS tablet Take 1 tablet (10 mg total) by mouth daily before breakfast. 90 tablet 3   ezetimibe  (ZETIA ) 10 MG tablet Take 1 tablet (10 mg total) by mouth daily. 90 tablet 3   isosorbide  mononitrate (IMDUR ) 60 MG 24 hr tablet Take 1 tablet (60 mg total) by mouth daily. 90 tablet 3   olmesartan  (BENICAR ) 40 MG tablet Take 1 tablet (40 mg total) by mouth daily. 90 tablet 3   acetaminophen  (TYLENOL ) 500 MG tablet Take 2 tablets (1,000 mg total) by mouth every 8 (eight) hours as needed. (Patient taking differently: Take 1,000 mg by mouth every 8 (eight) hours as needed for moderate pain (pain score 4-6).) 30 tablet 0   albuterol  (VENTOLIN  HFA) 108 (90 Base) MCG/ACT inhaler Inhale 2 puffs into the lungs every 6 (six) hours as needed for shortness of breath or wheezing.     cephALEXin  (KEFLEX ) 250 MG capsule Take 1 capsule (250 mg total) by mouth 2 (two) times daily. 6 capsule 0   empagliflozin  (JARDIANCE ) 10 MG TABS tablet Take 1 tablet (10 mg total) by mouth daily before breakfast. 28 tablet 0   empagliflozin  (JARDIANCE ) 10 MG TABS tablet Take 1 tablet (10 mg total) by mouth daily before breakfast. 28 tablet    estradiol  (ESTRACE ) 0.1 MG/GM vaginal cream Place 0.5 g vaginally 2 (two) times a week. Place 0.5g nightly for two weeks then twice a week after 30 g 3   [Paused] furosemide  (LASIX ) 20 MG tablet Take 1 tablet by mouth once daily (Patient not taking: Reported on 10/27/2023) 90 tablet 3   [Paused] metFORMIN  (GLUCOPHAGE ) 1000 MG tablet Take 1,000 mg by  mouth 2 (two) times daily. (Patient not taking: Reported on 10/27/2023)     Multiple Vitamin (MULTIVITAMIN) capsule Take 1 capsule by mouth daily.     nitroGLYCERIN  (NITROSTAT ) 0.4 MG SL tablet Place 1 tablet (0.4 mg total) under the tongue every 5 (five) minutes as needed for chest pain. 25 tablet 3   No current facility-administered medications on file prior to visit.    Allergies  Allergen Reactions   Fish-Derived Products     Blood pressure 132/62, pulse (!) 54.   Assessment/Plan:     1. Hypertension -  Essential hypertension Assessment: Blood pressure close to goal today in clinic No reliable home readings available  Plan: Continue carvedilol  6.25 mg twice a day, olmesartan  40 mg daily, amlodipine  10 mg daily, isosorbide  60 mg daily Follow-up in a month Patient asked to write down her blood pressure and heart rate readings and bring them to her next visit   Mixed diabetic hyperlipidemia associated with type 2 diabetes mellitus (HCC) Assessment: Patient currently on atorvastatin  80 mg daily and Repatha  140 mg every 14 days Reports back pain and cough that she wants to know if this is from her Repatha  Back pain is bothersome but cough is not  Plan: Advised to hold Repatha  1 or 2 doses to see if pain resolves and then rechallenge with Repatha  We will discuss GLP therapy in more detail at next visit       Thank you  Dehaven Sine D Claiborne Stroble, Pharm.JONETTA SARAN, CPP [facility] A Division of [facility] [address] [place], [place] [address]  Phone: [phone]; Fax: [phone]

## 2023-12-17 ENCOUNTER — Ambulatory Visit (HOSPITAL_COMMUNITY)
Admission: RE | Admit: 2023-12-17 | Discharge: 2023-12-17 | Disposition: A | Discharge status: Home / Self Care | Source: Ambulatory Visit | Attending: Internal Medicine | Admitting: Internal Medicine

## 2023-12-17 ENCOUNTER — Ambulatory Visit: Payer: Self-pay | Admitting: Internal Medicine

## 2023-12-17 DIAGNOSIS — R600 Localized edema: Secondary | ICD-10-CM | POA: Diagnosis present

## 2023-12-18 ENCOUNTER — Other Ambulatory Visit (HOSPITAL_COMMUNITY): Payer: Self-pay

## 2023-12-22 ENCOUNTER — Telehealth: Payer: Self-pay | Admitting: Internal Medicine

## 2023-12-22 NOTE — Telephone Encounter (Signed)
Patient called to follow-up on test results. 

## 2023-12-22 NOTE — Telephone Encounter (Signed)
 Spoke to patient advised to have legs measured at medical supply to to get correct size.Advised to purchase minimal compression 15 to 20.No prescription needed.

## 2023-12-22 NOTE — Telephone Encounter (Signed)
 Patient wants to know what size compression sock she will need - 15-20 or 20-30 or if she will need a prescription for a larger size.

## 2023-12-22 NOTE — Telephone Encounter (Signed)
 Spoke to patient lower ext doppler results given.She will try wearing compression stockings during the day and take off at night.

## 2024-01-05 NOTE — Progress Notes (Signed)
 I reviewed the AWV findings of the licensed medical professional who conducted the visit. I was present in the office suite and immediately available to provide assistance and direction throughout the time the service was provided.

## 2024-01-06 LAB — OPHTHALMOLOGY REPORT-SCANNED

## 2024-01-12 ENCOUNTER — Telehealth: Payer: Self-pay | Admitting: Pharmacy Technician

## 2024-01-12 ENCOUNTER — Ambulatory Visit: Attending: Cardiology | Admitting: Pharmacist

## 2024-01-12 ENCOUNTER — Other Ambulatory Visit (HOSPITAL_COMMUNITY): Payer: Self-pay

## 2024-01-12 VITALS — BP 138/70 | HR 53

## 2024-01-12 DIAGNOSIS — I10 Essential (primary) hypertension: Secondary | ICD-10-CM | POA: Diagnosis not present

## 2024-01-12 DIAGNOSIS — E785 Hyperlipidemia, unspecified: Secondary | ICD-10-CM

## 2024-01-12 NOTE — Patient Instructions (Signed)
 Please resume your Repatha  injections every 14 days Please stop by the lab on the 1st floor on your way out for some labwork Please bring in your blood pressure machine and readings to next visit Please try to walk to the mailbox and back TWICE a day  Your blood pressure goal is < 130/76mmHg  Important lifestyle changes to control high blood pressure  Intervention  Effect on the BP   Weight loss Weight loss is one of the most effective lifestyle changes for controlling blood pressure. If you're overweight or obese, losing even a small amount of weight can help reduce blood pressure.    Blood pressure can decrease by 1 millimeter of mercury (mmHg) with each kilogram (about 2.2 pounds) of weight lost.   Exercise regularly As a general goal, aim for 30 minutes of moderate physical activity every day.    Regular physical activity can lower blood pressure by 5 - 8 mmHg.   Eat a healthy diet Eat a diet rich in whole grains, fruits, vegetables, lean meat, and low-fat dairy products. Limit processed foods, saturated fat, and sweets.    A heart-healthy diet can lower high blood pressure by 10 mmHg.   Reduce salt (sodium) in your diet Aim for 000mg  of sodium each day. Avoid deli meats, canned food, and frozen microwave meals which are high in sodium.     Limiting sodium can reduce blood pressure by 5 mmHg.   Limit alcohol One drink equals 12 ounces of beer, 5 ounces of wine, or 1.5 ounces of 80-proof liquor.    Limiting alcohol to < 1 drink a day for women or < 2 drinks a day for men can help lower blood pressure by about 4 mmHg.   To check your pressure at home you will need to:   Sit up in a chair, with feet flat on the floor and back supported. Do not cross your ankles or legs. Rest your left arm so that the cuff is about heart level. If the cuff goes on your upper arm, then just relax your arm on the table, arm of the chair, or your lap. If you have a wrist cuff, hold your  wrist against your chest at heart level. Place the cuff snugly around your arm, about 1 inch above the crease of your elbow. The cords should be inside the groove of your elbow.  Sit quietly, with the cuff in place, for about 5 minutes. Then press the power button to start a reading. Do not talk or move while the reading is taking place.  Record your readings on a sheet of paper. Although most cuffs have a memory, it is often easier to see a pattern developing when the numbers are all in front of you.  You can repeat the reading after 1-3 minutes if it is recommended.   Make sure your bladder is empty and you have not had caffeine or tobacco within the last 30 minutes   Always bring your blood pressure log with you to your appointments. If you have not brought your monitor in to be double checked for accuracy, please bring it to your next appointment.   You can find a list of validated (accurate) blood pressure cuffs at: validatebp.org

## 2024-01-12 NOTE — Telephone Encounter (Signed)
   Patient Advocate Encounter   The patient was approved for a [employer] grant that will help cover the cost of jardiance  Total amount awarded, 7500.  Effective: 12/13/23 - 12/11/24   APW:[id number] ERW:[id number] Hmnle:[id number] PI:[id number] [employer] ID: [id number]   Pharmacy provided with approval and processing information. Patient informed via mychart

## 2024-01-12 NOTE — Progress Notes (Signed)
 Patient ID: Debbie [name]                 DOB: 1945/07/18                      MRN: [id number]      HPI: Debbie [name] is a 78 y.o. female referred by Dr. Wendel to HTN clinic. PMH is significant for  CAD s/p DES x 3 to the RCA in 2003, s/p DES to o-pRCA and PTCA-mRCA (ISR) in 07/2021, paroxysmal atrial fibrillation on Eliquis , hypertension, hyperlipidemia, stroke, type 2 diabetes and chronic lower back pain.  Seen 10/27/23 for DOD apt for elevated BP. Olmesartan  was increased to 40mg  daily.  She was also referred to discuss GLP-1.  Patient seen 12/09/23 in PharmD clinic. She showed up at her appointment at the wrong time and we worked her in. Due to time constraints GPP-1 was briefly discussed. Her BP was 132/62. No home readings were available. Complained of back pain/cough with repatha . Advised she could hold 1-2 doses to see if there was improvement.   Patient presents today for follow up. She brings in a list of blood pressure readings that quite variable. She denied any dizziness, lightheadedness, headache, blurred vision, SOB, swelling. Has held Repatha  for 2 injections. No improvement in symptoms. She was asked to resume.    She cannot afford GLP-1. Will be able to get her [employer] [place] for Jardiance  for cardiomyopathy, but no grant for GLP-1.    Current HTN meds: carvedilol  6.25mg  twice a day, olmesartan  40mg  daily, amlodipine  10mg  daily, isosorbide  60 mg daily Previously tried: ramipril , metoprolol , hctz BP goal: <130/80  Family History:  Family History  Problem Relation Age of Onset   Uterine cancer Mother    Diabetes type II Sister    Heart disease Sister    Colon cancer Neg Hx    Colon polyps Neg Hx    Esophageal cancer Neg Hx    Rectal cancer Neg Hx    Stomach cancer Neg Hx    Breast cancer Neg Hx    Bladder Cancer Neg Hx     Social History:  Social History   Socioeconomic History   Marital status: Married    Spouse name: Elsie   Number of children: 1    Years of education: 13   Highest education level: Not on file  Occupational History   Occupation: Retired    Comment: Engineer, manufacturing at [employer]  Tobacco Use   Smoking status: Former    Current packs/day: 0.00    Types: Cigarettes    Quit date: 2024    Years since quitting: 1.7   Smokeless tobacco: Never   Tobacco comments:    10/29/23: Patient stated that she quit smoking 2024.  Vaping Use   Vaping status: Never Used  Substance and Sexual Activity   Alcohol use: Never   Drug use: No   Sexual activity: Not Currently    Partners: Male  Other Topics Concern   Not on file  Social History Narrative   Right handed   Lives with husband   Caffeine use: Tea twice per week   Social Drivers of Health   Financial Resource Strain: Low Risk  (10/29/2023)   Overall Financial Resource Strain (CARDIA)    Difficulty of Paying Living Expenses: Not hard at all  Food Insecurity: No Food Insecurity (10/29/2023)   Hunger Vital Sign    Worried About Running Out of Food in the  Last Year: Never true    Ran Out of Food in the Last Year: Never true  Transportation Needs: No Transportation Needs (10/29/2023)   PRAPARE - Administrator, Civil Service (Medical): No    Lack of Transportation (Non-Medical): No  Physical Activity: Patient Declined (10/29/2023)   Exercise Vital Sign    Days of Exercise per Week: Patient declined    Minutes of Exercise per Session: Patient declined  Stress: No Stress Concern Present (10/29/2023)   [employer] of Occupational Health - Occupational Stress Questionnaire    Feeling of Stress: Not at all  Social Connections: Moderately Integrated (10/29/2023)   Social Connection and Isolation Panel    Frequency of Communication with Friends and Family: More than three times a week    Frequency of Social Gatherings with Friends and Family: Three times a week    Attends Religious Services: More than 4 times per year    Active Member of Clubs or Organizations:  No    Attends Banker Meetings: Never    Marital Status: Married  Catering manager Violence: Not At Risk (10/29/2023)   Humiliation, Afraid, Rape, and Kick questionnaire    Fear of Current or Ex-Partner: No    Emotionally Abused: No    Physically Abused: No    Sexually Abused: No    Diet:   Exercise:  10 min walk daily  Home BP readings:  150/100, 168/125, 196/93, 141/67, 172/85, 166/59, 170/79, 191/92, 181/86 HR 50's  Wt Readings from Last 3 Encounters:  11/05/23 179 lb (81.2 kg)  10/29/23 183 lb (83 kg)  10/27/23 183 lb (83 kg)   BP Readings from Last 3 Encounters:  01/12/24 138/70  12/09/23 132/62  11/05/23 (!) 144/77   Pulse Readings from Last 3 Encounters:  01/12/24 (!) 53  12/09/23 (!) 54  11/05/23 77    Renal function: CrCl cannot be calculated (Patient's most recent lab result is older than the maximum 21 days allowed.).  Past Medical History:  Diagnosis Date   Arthritis    CAD (coronary artery disease) 11/1999   RCA stent X3, patent '06. Nuc low risk 9/13   Cataract    beginning stages   Diabetes mellitus without complication (HCC)    Dysrhythmia    History of stress test 01/08/2012   Showed minimal anterior thinning not felt to be significant.   Hx of echocardiogram 12/12/2011   EF>55%   Hypercholesteremia    Hypertension    Myocardial infarction [facility])    18 years ago    age 2   PAF (paroxysmal atrial fibrillation) (HCC) 11/2011   SSS component with some bradycardia   Stroke (HCC) 1998   Vitamin D deficiency     Current Outpatient Medications on File Prior to Visit  Medication Sig Dispense Refill   amLODipine  (NORVASC ) 10 MG tablet Take 1 tablet (10 mg total) by mouth daily. 90 tablet 3   apixaban  (ELIQUIS ) 5 MG TABS tablet Take 1 tablet (5 mg total) by mouth 2 (two) times daily. 180 tablet 3   atorvastatin  (LIPITOR ) 80 MG tablet Take 1 tablet (80 mg total) by mouth daily. 90 tablet 3   carvedilol  (COREG ) 6.25 MG tablet Take 1  tablet (6.25 mg total) by mouth 2 (two) times daily. 180 tablet 3   empagliflozin  (JARDIANCE ) 10 MG TABS tablet Take 1 tablet (10 mg total) by mouth daily before breakfast. 90 tablet 3   ezetimibe  (ZETIA ) 10 MG tablet Take 1 tablet (10 mg  total) by mouth daily. 90 tablet 3   isosorbide  mononitrate (IMDUR ) 60 MG 24 hr tablet Take 1 tablet (60 mg total) by mouth daily. 90 tablet 3   Multiple Vitamin (MULTIVITAMIN) capsule Take 1 capsule by mouth daily.     olmesartan  (BENICAR ) 40 MG tablet Take 1 tablet (40 mg total) by mouth daily. 90 tablet 3   acetaminophen  (TYLENOL ) 500 MG tablet Take 2 tablets (1,000 mg total) by mouth every 8 (eight) hours as needed. (Patient taking differently: Take 1,000 mg by mouth every 8 (eight) hours as needed for moderate pain (pain score 4-6).) 30 tablet 0   albuterol  (VENTOLIN  HFA) 108 (90 Base) MCG/ACT inhaler Inhale 2 puffs into the lungs every 6 (six) hours as needed for shortness of breath or wheezing.     empagliflozin  (JARDIANCE ) 10 MG TABS tablet Take 1 tablet (10 mg total) by mouth daily before breakfast. 28 tablet 0   empagliflozin  (JARDIANCE ) 10 MG TABS tablet Take 1 tablet (10 mg total) by mouth daily before breakfast. 28 tablet    estradiol  (ESTRACE ) 0.1 MG/GM vaginal cream Place 0.5 g vaginally 2 (two) times a week. Place 0.5g nightly for two weeks then twice a week after (Patient not taking: Reported on 01/12/2024) 30 g 3   Evolocumab  (REPATHA  SURECLICK) 140 MG/ML SOAJ Inject 140 mg into the skin every 14 (fourteen) days. (Patient not taking: Reported on 01/12/2024) 6 mL 3   [Paused] furosemide  (LASIX ) 20 MG tablet Take 1 tablet by mouth once daily (Patient not taking: Reported on 10/27/2023) 90 tablet 3   [Paused] metFORMIN  (GLUCOPHAGE ) 1000 MG tablet Take 1,000 mg by mouth 2 (two) times daily. (Patient not taking: Reported on 10/27/2023)     nitroGLYCERIN  (NITROSTAT ) 0.4 MG SL tablet Place 1 tablet (0.4 mg total) under the tongue every 5 (five) minutes as  needed for chest pain. 25 tablet 3   No current facility-administered medications on file prior to visit.    Allergies  Allergen Reactions   Fish-Derived Products     Blood pressure 138/70, pulse (!) 53.   Assessment/Plan:     1. Hypertension -  Essential hypertension Assessment: Blood pressure above goal of <130/80 in clinic Her home readings are significantly higher than clinic readings. Home cuff has not been verified She does prescribe proper technique. Encouraged to make sure she rests prior to checking Patient does seen a little confused about the names of her medications but the # of pills she takes does match our records Needs BMP after her olmesartan  dose was increased  Plan: Check BMP today- depending on results could add hydrochlorothiazide  vs spiro vs hydralazine  If home BP does not improve with resting prior to checking she is to call me Bring in BP cuff to next visit for validation or earlier if still high Follow up in 1 month  Hyperlipidemia Assessment: No improvement in symptoms with holding Repatha   Plan: Resume Repatha  140mg  injections every 14 days    Thank you  Dorothymae Maciver D Janeal Abadi, Pharm.JONETTA SARAN, CPP [facility] A Division of [facility] [address] [place], [place] [address]  Phone: [phone]; Fax: [phone]

## 2024-01-12 NOTE — Assessment & Plan Note (Signed)
 Assessment: Blood pressure above goal of <130/80 in clinic Her home readings are significantly higher than clinic readings. Home cuff has not been verified She does prescribe proper technique. Encouraged to make sure she rests prior to checking Patient does seen a little confused about the names of her medications but the # of pills she takes does match our records Needs BMP after her olmesartan  dose was increased  Plan: Check BMP today- depending on results could add hydrochlorothiazide  vs spiro vs hydralazine  If home BP does not improve with resting prior to checking she is to call me Bring in BP cuff to next visit for validation or earlier if still high Follow up in 1 month

## 2024-01-12 NOTE — Assessment & Plan Note (Signed)
 Assessment: No improvement in symptoms with holding Repatha   Plan: Resume Repatha  140mg  injections every 14 days

## 2024-01-13 ENCOUNTER — Other Ambulatory Visit (HOSPITAL_COMMUNITY): Payer: Self-pay

## 2024-01-13 ENCOUNTER — Ambulatory Visit: Payer: Self-pay | Admitting: Pharmacist

## 2024-01-13 LAB — BASIC METABOLIC PANEL WITH GFR
BUN/Creatinine Ratio: 12 (ref 12–28)
BUN: 18 mg/dL (ref 8–27)
CO2: 15 mmol/L — ABNORMAL LOW (ref 20–29)
Calcium: 9.8 mg/dL (ref 8.7–10.3)
Chloride: 109 mmol/L — ABNORMAL HIGH (ref 96–106)
Creatinine, Ser: 1.51 mg/dL — ABNORMAL HIGH (ref 0.57–1.00)
Glucose: 118 mg/dL — ABNORMAL HIGH (ref 70–99)
Potassium: 3.9 mmol/L (ref 3.5–5.2)
Sodium: 141 mmol/L (ref 134–144)
eGFR: 35 mL/min/1.73 — ABNORMAL LOW (ref >59)

## 2024-01-13 MED ORDER — SPIRONOLACTONE 25 MG PO TABS
25.0000 mg | ORAL_TABLET | Freq: Every day | ORAL | 3 refills | Status: AC
  Filled 2024-01-13: qty 90, 90d supply, fill #0
  Filled 2024-04-09: qty 90, 90d supply, fill #1

## 2024-01-13 NOTE — Telephone Encounter (Signed)
 Patient made aware of lab results. Scr stable. Will start spironolactone 25mg  daily. Repeat labs in 1 month at f/u apt 11/3.  Made aware that she was approved for [employer] grant.

## 2024-01-27 ENCOUNTER — Other Ambulatory Visit: Payer: Self-pay | Admitting: Internal Medicine

## 2024-01-27 DIAGNOSIS — Z1231 Encounter for screening mammogram for malignant neoplasm of breast: Secondary | ICD-10-CM

## 2024-01-31 ENCOUNTER — Other Ambulatory Visit (HOSPITAL_BASED_OUTPATIENT_CLINIC_OR_DEPARTMENT_OTHER): Payer: Self-pay

## 2024-02-02 ENCOUNTER — Other Ambulatory Visit (HOSPITAL_COMMUNITY): Payer: Self-pay

## 2024-02-05 ENCOUNTER — Encounter: Payer: Self-pay | Admitting: Internal Medicine

## 2024-02-10 ENCOUNTER — Ambulatory Visit
Admission: RE | Admit: 2024-02-10 | Discharge: 2024-02-10 | Disposition: A | Discharge status: Home / Self Care | Source: Ambulatory Visit | Attending: Internal Medicine | Admitting: Internal Medicine

## 2024-02-10 DIAGNOSIS — Z1231 Encounter for screening mammogram for malignant neoplasm of breast: Secondary | ICD-10-CM

## 2024-02-13 ENCOUNTER — Other Ambulatory Visit (HOSPITAL_COMMUNITY): Payer: Self-pay

## 2024-02-13 ENCOUNTER — Encounter: Payer: Self-pay | Admitting: Gastroenterology

## 2024-02-16 ENCOUNTER — Other Ambulatory Visit (HOSPITAL_COMMUNITY): Payer: Self-pay

## 2024-02-16 ENCOUNTER — Ambulatory Visit: Attending: Cardiovascular Disease | Admitting: Pharmacist

## 2024-02-16 VITALS — BP 132/60 | HR 47

## 2024-02-16 DIAGNOSIS — I10 Essential (primary) hypertension: Secondary | ICD-10-CM

## 2024-02-16 LAB — BASIC METABOLIC PANEL WITH GFR
BUN/Creatinine Ratio: 15 (ref 12–28)
BUN: 24 mg/dL (ref 8–27)
CO2: 15 mmol/L — ABNORMAL LOW (ref 20–29)
Calcium: 9.9 mg/dL (ref 8.7–10.3)
Chloride: 110 mmol/L — ABNORMAL HIGH (ref 96–106)
Creatinine, Ser: 1.65 mg/dL — ABNORMAL HIGH (ref 0.57–1.00)
Glucose: 116 mg/dL — ABNORMAL HIGH (ref 70–99)
Potassium: 4.2 mmol/L (ref 3.5–5.2)
Sodium: 141 mmol/L (ref 134–144)
eGFR: 31 mL/min/1.73 — ABNORMAL LOW (ref >59)

## 2024-02-16 MED ORDER — CARVEDILOL 3.125 MG PO TABS
3.1250 mg | ORAL_TABLET | Freq: Two times a day (BID) | ORAL | 3 refills | Status: AC
  Filled 2024-02-16: qty 180, 90d supply, fill #0
  Filled 2024-05-19: qty 180, 90d supply, fill #1

## 2024-02-16 NOTE — Assessment & Plan Note (Addendum)
 Assessment: Blood pressure in clinic is close to goal of <130/80 Home readings are very high, but technique was improper Reviewed proper technique and provided written instructions Encouraged her to decrease her sweet tea intake  Encouraged her to increase exercise  Plan: Decrease carvedilol  to 3.125mg  BID due to bradycardia Continue olmesartan  40mg  daily, amlodipine  10mg  daily, isosorbide  60 mg daily, spironolactone 25mg  daily Continue monitoring BP at home with proper technique Follow up in 6 weeks

## 2024-02-16 NOTE — Patient Instructions (Addendum)
 Your blood pressure goal is < 130/64mmHg   Please decrease your carvedilol  to 3.125mg  twice a day Continue olmesartan  40mg  daily, amlodipine  10mg  daily, isosorbide  60 mg daily, spironolactone 25mg  daily.   Please bring in your medication bottles to next visit   Important lifestyle changes to control high blood pressure  Intervention  Effect on the BP   Weight loss Weight loss is one of the most effective lifestyle changes for controlling blood pressure. If you're overweight or obese, losing even a small amount of weight can help reduce blood pressure.    Blood pressure can decrease by 1 millimeter of mercury (mmHg) with each kilogram (about 2.2 pounds) of weight lost.   Exercise regularly As a general goal, aim for 30 minutes of moderate physical activity every day.    Regular physical activity can lower blood pressure by 5 - 8 mmHg.   Eat a healthy diet Eat a diet rich in whole grains, fruits, vegetables, lean meat, and low-fat dairy products. Limit processed foods, saturated fat, and sweets.    A heart-healthy diet can lower high blood pressure by 10 mmHg.   Reduce salt (sodium) in your diet Aim for 000mg  of sodium each day. Avoid deli meats, canned food, and frozen microwave meals which are high in sodium.     Limiting sodium can reduce blood pressure by 5 mmHg.   Limit alcohol One drink equals 12 ounces of beer, 5 ounces of wine, or 1.5 ounces of 80-proof liquor.    Limiting alcohol to < 1 drink a day for women or < 2 drinks a day for men can help lower blood pressure by about 4 mmHg.   To check your pressure at home you will need to:   Sit up in a chair, with feet flat on the floor and back supported. Do not cross your ankles or legs. Rest your left arm so that the cuff is about heart level. If the cuff goes on your upper arm, then just relax your arm on the table, arm of the chair, or your lap. If you have a wrist cuff, hold your wrist against your chest at heart  level. Place the cuff snugly around your arm, about 1 inch above the crease of your elbow. The cords should be inside the groove of your elbow.  Sit quietly, with the cuff in place, for about 5 minutes. Then press the power button to start a reading. Do not talk or move while the reading is taking place.  Record your readings on a sheet of paper. Although most cuffs have a memory, it is often easier to see a pattern developing when the numbers are all in front of you.  You can repeat the reading after 1-3 minutes if it is recommended.   Make sure your bladder is empty and you have not had caffeine or tobacco within the last 30 minutes   Always bring your blood pressure log with you to your appointments. If you have not brought your monitor in to be double checked for accuracy, please bring it to your next appointment.   You can find a list of validated (accurate) blood pressure cuffs at: validatebp.org

## 2024-02-16 NOTE — Progress Notes (Signed)
 Patient ID: Debbie [name]                 DOB: Jul 25, 1945                      MRN: [id number]      HPI: Debbie [name] is a 78 y.o. female referred by Dr. Wendel to HTN clinic. PMH is significant for  CAD s/p DES x 3 to the RCA in 2003, s/p DES to o-pRCA and PTCA-mRCA (ISR) in 07/2021, paroxysmal atrial fibrillation on Eliquis , hypertension, hyperlipidemia, stroke, type 2 diabetes and chronic lower back pain.  Seen 10/27/23 for DOD apt for elevated BP. Olmesartan  was increased to 40mg  daily.  She was also referred to discuss GLP-1.  Patient seen 12/09/23 in PharmD clinic. She showed up at her appointment at the wrong time and we worked her in. Due to time constraints GPP-1 was briefly discussed. Her BP was 132/62. No home readings were available. Complained of back pain/cough with repatha . Advised she could hold 1-2 doses to see if there was improvement.   Seen 01/12/24- BP was 138/70. Homes readings varied and were much higher. Home cuff has not been validated. She was encouraged to rest prior to checking BP.  Spironolactone 25mg  daily was started. She cannot afford GLP-1. She did not see any improvement in pain when holding Repatha . She was asked to resume.   Patient presents today to clinic. She brings in her home BP cuff. The readings on the memory are very high (>200/>100) some some occasional readings at goal. After review, patient did not have cuff on correctly and her legs were crossed and not on the floor. With proper technique, machine was accurate.  She denies any dizziness, lightheadedness, headache, blurred vision, SOB, swelling. HR in the 40's-low 50's. Exercising very little due to hip pain. Encouraged her to try a desk cycle or going for several short walks a day.   133/63 HR 48 136/65 HR 49 132/60 140/68 47   Current HTN meds: carvedilol  6.25mg  twice a day, olmesartan  40mg  daily, amlodipine  10mg  daily, isosorbide  60 mg daily, spironolactone 25mg  daily.  Previously tried:  ramipril , metoprolol , hctz BP goal: <130/80  Family History:  Family History  Problem Relation Age of Onset   Uterine cancer Mother    Diabetes type II Sister    Heart disease Sister    Colon cancer Neg Hx    Colon polyps Neg Hx    Esophageal cancer Neg Hx    Rectal cancer Neg Hx    Stomach cancer Neg Hx    Breast cancer Neg Hx    Bladder Cancer Neg Hx     Social History: 2 cig a week, no ETOH  Diet: 32 oz sweet tea per day  Exercise:  5 min walk daily  Home BP readings:    Wt Readings from Last 3 Encounters:  11/05/23 179 lb (81.2 kg)  10/29/23 183 lb (83 kg)  10/27/23 183 lb (83 kg)   BP Readings from Last 3 Encounters:  02/16/24 132/60  01/12/24 138/70  12/09/23 132/62   Pulse Readings from Last 3 Encounters:  02/16/24 (!) 47  01/12/24 (!) 53  12/09/23 (!) 54    Renal function: CrCl cannot be calculated (Patient's most recent lab result is older than the maximum 21 days allowed.).  Past Medical History:  Diagnosis Date   Arthritis    CAD (coronary artery disease) 11/1999   RCA stent X3, patent '06. Nuc  low risk 9/13   Cataract    beginning stages   Diabetes mellitus without complication (HCC)    Dysrhythmia    History of stress test 01/08/2012   Showed minimal anterior thinning not felt to be significant.   Hx of echocardiogram 12/12/2011   EF>55%   Hypercholesteremia    Hypertension    Myocardial infarction [facility])    18 years ago    age 13   PAF (paroxysmal atrial fibrillation) (HCC) 11/2011   SSS component with some bradycardia   Stroke (HCC) 1998   Vitamin D deficiency     Current Outpatient Medications on File Prior to Visit  Medication Sig Dispense Refill   amLODipine  (NORVASC ) 10 MG tablet Take 1 tablet (10 mg total) by mouth daily. 90 tablet 3   apixaban  (ELIQUIS ) 5 MG TABS tablet Take 1 tablet (5 mg total) by mouth 2 (two) times daily. 180 tablet 3   atorvastatin  (LIPITOR ) 80 MG tablet Take 1 tablet (80 mg total) by mouth daily. 90  tablet 3   Evolocumab  (REPATHA  SURECLICK) 140 MG/ML SOAJ Inject 140 mg into the skin every 14 (fourteen) days. 6 mL 3   ezetimibe  (ZETIA ) 10 MG tablet Take 1 tablet (10 mg total) by mouth daily. 90 tablet 3   Multiple Vitamin (MULTIVITAMIN) capsule Take 1 capsule by mouth daily.     olmesartan  (BENICAR ) 40 MG tablet Take 1 tablet (40 mg total) by mouth daily. 90 tablet 3   spironolactone (ALDACTONE) 25 MG tablet Take 1 tablet (25 mg total) by mouth daily. 90 tablet 3   acetaminophen  (TYLENOL ) 500 MG tablet Take 2 tablets (1,000 mg total) by mouth every 8 (eight) hours as needed. (Patient taking differently: Take 1,000 mg by mouth every 8 (eight) hours as needed for moderate pain (pain score 4-6).) 30 tablet 0   albuterol  (VENTOLIN  HFA) 108 (90 Base) MCG/ACT inhaler Inhale 2 puffs into the lungs every 6 (six) hours as needed for shortness of breath or wheezing.     empagliflozin  (JARDIANCE ) 10 MG TABS tablet Take 1 tablet (10 mg total) by mouth daily before breakfast. 90 tablet 3   empagliflozin  (JARDIANCE ) 10 MG TABS tablet Take 1 tablet (10 mg total) by mouth daily before breakfast. 28 tablet 0   empagliflozin  (JARDIANCE ) 10 MG TABS tablet Take 1 tablet (10 mg total) by mouth daily before breakfast. 28 tablet    estradiol  (ESTRACE ) 0.1 MG/GM vaginal cream Place 0.5 g vaginally 2 (two) times a week. Place 0.5g nightly for two weeks then twice a week after (Patient not taking: Reported on 01/12/2024) 30 g 3   [Paused] furosemide  (LASIX ) 20 MG tablet Take 1 tablet by mouth once daily (Patient not taking: Reported on 10/27/2023) 90 tablet 3   isosorbide  mononitrate (IMDUR ) 60 MG 24 hr tablet Take 1 tablet (60 mg total) by mouth daily. (Patient not taking: Reported on 02/16/2024) 90 tablet 3   [Paused] metFORMIN  (GLUCOPHAGE ) 1000 MG tablet Take 1,000 mg by mouth 2 (two) times daily. (Patient not taking: Reported on 10/27/2023)     nitroGLYCERIN  (NITROSTAT ) 0.4 MG SL tablet Place 1 tablet (0.4 mg total) under  the tongue every 5 (five) minutes as needed for chest pain. 25 tablet 3   No current facility-administered medications on file prior to visit.    Allergies  Allergen Reactions   Fish Protein-Containing Drug Products     Blood pressure 132/60, pulse (!) 47.   Assessment/Plan:     1. Hypertension -  Essential  hypertension Assessment: Blood pressure in clinic is close to goal of <130/80 Home readings are very high, but technique was improper Reviewed proper technique and provided written instructions Encouraged her to decrease her sweet tea intake  Encouraged her to increase exercise  Plan: Decrease carvedilol  to 3.125mg  BID due to bradycardia Continue olmesartan  40mg  daily, amlodipine  10mg  daily, isosorbide  60 mg daily, spironolactone 25mg  daily Continue monitoring BP at home with proper technique Follow up in 6 weeks     Thank you  Eleanor JONETTA Crews, Pharm.JONETTA SARAN, CPP [facility] A Division of  [facility] [address] [place], [place] [address]  Phone: [phone]; Fax: [phone]

## 2024-02-17 ENCOUNTER — Ambulatory Visit: Payer: Self-pay | Admitting: Pharmacist

## 2024-03-02 ENCOUNTER — Ambulatory Visit: Admitting: Student

## 2024-03-02 ENCOUNTER — Other Ambulatory Visit (HOSPITAL_COMMUNITY): Payer: Self-pay

## 2024-03-02 VITALS — BP 143/76 | HR 51 | Temp 98.0°F | Ht 65.0 in | Wt 181.8 lb

## 2024-03-02 DIAGNOSIS — F17211 Nicotine dependence, cigarettes, in remission: Secondary | ICD-10-CM | POA: Diagnosis not present

## 2024-03-02 DIAGNOSIS — Z7984 Long term (current) use of oral hypoglycemic drugs: Secondary | ICD-10-CM

## 2024-03-02 DIAGNOSIS — Z23 Encounter for immunization: Secondary | ICD-10-CM | POA: Diagnosis not present

## 2024-03-02 DIAGNOSIS — I129 Hypertensive chronic kidney disease with stage 1 through stage 4 chronic kidney disease, or unspecified chronic kidney disease: Secondary | ICD-10-CM

## 2024-03-02 DIAGNOSIS — N1832 Chronic kidney disease, stage 3b: Secondary | ICD-10-CM | POA: Diagnosis not present

## 2024-03-02 DIAGNOSIS — E1122 Type 2 diabetes mellitus with diabetic chronic kidney disease: Secondary | ICD-10-CM

## 2024-03-02 DIAGNOSIS — Z79899 Other long term (current) drug therapy: Secondary | ICD-10-CM

## 2024-03-02 DIAGNOSIS — I10 Essential (primary) hypertension: Secondary | ICD-10-CM

## 2024-03-02 DIAGNOSIS — N1831 Chronic kidney disease, stage 3a: Secondary | ICD-10-CM

## 2024-03-02 DIAGNOSIS — E119 Type 2 diabetes mellitus without complications: Secondary | ICD-10-CM

## 2024-03-02 LAB — POCT GLYCOSYLATED HEMOGLOBIN (HGB A1C): HbA1c, POC (controlled diabetic range): 6.7 % (ref 0.0–7.0)

## 2024-03-02 LAB — GLUCOSE, CAPILLARY: Glucose-Capillary: 110 mg/dL — ABNORMAL HIGH (ref 70–99)

## 2024-03-02 MED ORDER — SODIUM BICARBONATE 650 MG PO TABS
650.0000 mg | ORAL_TABLET | Freq: Two times a day (BID) | ORAL | 1 refills | Status: DC
  Filled 2024-03-02: qty 60, 30d supply, fill #0
  Filled 2024-03-29: qty 60, 30d supply, fill #1

## 2024-03-02 MED ORDER — SODIUM BICARBONATE 650 MG PO TABS: 650.0000 mg | ORAL_TABLET | Freq: Two times a day (BID) | ORAL | 1 refills | Status: DC

## 2024-03-02 NOTE — Patient Instructions (Signed)
 Thank you, Ms.Ronal Addie Rocks for allowing us  to provide your care today. Today we discussed your diabetes and your kidney disease.  During your last hospitalization, you were given bicarb because your kidneys were constantly losing some bicarb.  The bicarb has consistently been low and your kidney function has been trending down as well.  Prescribing you some bicarb supplementation.  Take 650 mg tablet 1 in the morning and 1 at night.  Also refer you to the kidney doctor so they can better assess your kidney function.  Someone will call you to arrange for a set time and date to see the kidney doctor.  I have ordered the following labs for you:  Lab Orders         Glucose, capillary         POC Hbg A1C      Tests ordered today:    Referrals ordered today:   Referral Orders         Ambulatory referral to Nephrology      I have ordered the following medication/changed the following medications:   Stop the following medications: There are no discontinued medications.   Start the following medications: Meds ordered this encounter  Medications   sodium bicarbonate  650 MG tablet    Sig: Take 1 tablet (650 mg total) by mouth 2 (two) times daily.    Dispense:  60 tablet    Refill:  1     Follow up: 2 months    Should you have any questions or concerns please call the internal medicine clinic at [phone].   Drue Lisa Grow MD 03/02/2024, 9:51 AM   [facility]

## 2024-03-02 NOTE — Progress Notes (Signed)
 CC: DM follow up   HPI:  [name] is a 78 y.o. female living with a history stated below and presents today for diabetes follow-up and also discuss her other chronic conditions. Please see problem based assessment and plan for additional details.  Past Medical History:  Diagnosis Date   Arthritis    CAD (coronary artery disease) 11/1999   RCA stent X3, patent '06. Nuc low risk 9/13   Cataract    beginning stages   Diabetes mellitus without complication (HCC)    Dysrhythmia    History of stress test 01/08/2012   Showed minimal anterior thinning not felt to be significant.   Hx of echocardiogram 12/12/2011   EF>55%   Hypercholesteremia    Hypertension    Myocardial infarction [facility])    18 years ago    age 41   PAF (paroxysmal atrial fibrillation) (HCC) 11/2011   SSS component with some bradycardia   Stroke (HCC) 1998   Vitamin D deficiency     Current Outpatient Medications on File Prior to Visit  Medication Sig Dispense Refill   acetaminophen  (TYLENOL ) 500 MG tablet Take 2 tablets (1,000 mg total) by mouth every 8 (eight) hours as needed. (Patient taking differently: Take 1,000 mg by mouth every 8 (eight) hours as needed for moderate pain (pain score 4-6).) 30 tablet 0   albuterol  (VENTOLIN  HFA) 108 (90 Base) MCG/ACT inhaler Inhale 2 puffs into the lungs every 6 (six) hours as needed for shortness of breath or wheezing.     amLODipine  (NORVASC ) 10 MG tablet Take 1 tablet (10 mg total) by mouth daily. 90 tablet 3   apixaban  (ELIQUIS ) 5 MG TABS tablet Take 1 tablet (5 mg total) by mouth 2 (two) times daily. 180 tablet 3   atorvastatin  (LIPITOR ) 80 MG tablet Take 1 tablet (80 mg total) by mouth daily. 90 tablet 3   carvedilol  (COREG ) 3.125 MG tablet Take 1 tablet (3.125 mg total) by mouth 2 (two) times daily. 180 tablet 3   empagliflozin  (JARDIANCE ) 10 MG TABS tablet Take 1 tablet (10 mg total) by mouth daily before breakfast. 90 tablet 3   empagliflozin  (JARDIANCE ) 10  MG TABS tablet Take 1 tablet (10 mg total) by mouth daily before breakfast. 28 tablet 0   empagliflozin  (JARDIANCE ) 10 MG TABS tablet Take 1 tablet (10 mg total) by mouth daily before breakfast. 28 tablet    estradiol  (ESTRACE ) 0.1 MG/GM vaginal cream Place 0.5 g vaginally 2 (two) times a week. Place 0.5g nightly for two weeks then twice a week after (Patient not taking: Reported on 01/12/2024) 30 g 3   Evolocumab  (REPATHA  SURECLICK) 140 MG/ML SOAJ Inject 140 mg into the skin every 14 (fourteen) days. 6 mL 3   ezetimibe  (ZETIA ) 10 MG tablet Take 1 tablet (10 mg total) by mouth daily. 90 tablet 3   [Paused] furosemide  (LASIX ) 20 MG tablet Take 1 tablet by mouth once daily (Patient not taking: Reported on 10/27/2023) 90 tablet 3   isosorbide  mononitrate (IMDUR ) 60 MG 24 hr tablet Take 1 tablet (60 mg total) by mouth daily. (Patient not taking: Reported on 02/16/2024) 90 tablet 3   [Paused] metFORMIN  (GLUCOPHAGE ) 1000 MG tablet Take 1,000 mg by mouth 2 (two) times daily. (Patient not taking: Reported on 10/27/2023)     Multiple Vitamin (MULTIVITAMIN) capsule Take 1 capsule by mouth daily.     nitroGLYCERIN  (NITROSTAT ) 0.4 MG SL tablet Place 1 tablet (0.4 mg total) under the tongue every 5 (five)  minutes as needed for chest pain. 25 tablet 3   olmesartan  (BENICAR ) 40 MG tablet Take 1 tablet (40 mg total) by mouth daily. 90 tablet 3   spironolactone (ALDACTONE) 25 MG tablet Take 1 tablet (25 mg total) by mouth daily. 90 tablet 3   No current facility-administered medications on file prior to visit.    Family History  Problem Relation Age of Onset   Uterine cancer Mother    Diabetes type II Sister    Heart disease Sister    Colon cancer Neg Hx    Colon polyps Neg Hx    Esophageal cancer Neg Hx    Rectal cancer Neg Hx    Stomach cancer Neg Hx    Breast cancer Neg Hx    Bladder Cancer Neg Hx     Social History   Socioeconomic History   Marital status: Married    Spouse name: Elsie   Number  of children: 1   Years of education: 13   Highest education level: Not on file  Occupational History   Occupation: Retired    Comment: engineer, manufacturing at [employer]  Tobacco Use   Smoking status: Former    Current packs/day: 0.00    Types: Cigarettes    Quit date: 2024    Years since quitting: 1.8   Smokeless tobacco: Never   Tobacco comments:    10/29/23: Patient stated that she quit smoking 2024.  Vaping Use   Vaping status: Never Used  Substance and Sexual Activity   Alcohol use: Never   Drug use: No   Sexual activity: Not Currently    Partners: Male  Other Topics Concern   Not on file  Social History Narrative   Right handed   Lives with husband   Caffeine use: Tea twice per week   Social Drivers of Health   Financial Resource Strain: Low Risk  (10/29/2023)   Overall Financial Resource Strain (CARDIA)    Difficulty of Paying Living Expenses: Not hard at all  Food Insecurity: No Food Insecurity (10/29/2023)   Hunger Vital Sign    Worried About Running Out of Food in the Last Year: Never true    Ran Out of Food in the Last Year: Never true  Transportation Needs: No Transportation Needs (10/29/2023)   PRAPARE - Administrator, Civil Service (Medical): No    Lack of Transportation (Non-Medical): No  Physical Activity: Patient Declined (10/29/2023)   Exercise Vital Sign    Days of Exercise per Week: Patient declined    Minutes of Exercise per Session: Patient declined  Stress: No Stress Concern Present (10/29/2023)   [employer] of Occupational Health - Occupational Stress Questionnaire    Feeling of Stress: Not at all  Social Connections: Moderately Integrated (10/29/2023)   Social Connection and Isolation Panel    Frequency of Communication with Friends and Family: More than three times a week    Frequency of Social Gatherings with Friends and Family: Three times a week    Attends Religious Services: More than 4 times per year    Active Member of Clubs  or Organizations: No    Attends Banker Meetings: Never    Marital Status: Married  Catering Manager Violence: Not At Risk (10/29/2023)   Humiliation, Afraid, Rape, and Kick questionnaire    Fear of Current or Ex-Partner: No    Emotionally Abused: No    Physically Abused: No    Sexually Abused: No  Review of Systems: ROS negative except for what is noted on the assessment and plan.  Vitals:   03/02/24 0842 03/02/24 0849  BP: (!) 159/76 (!) 143/76  Pulse: 63 (!) 51  Temp: 98 F (36.7 C)   TempSrc: Oral   Weight: 181 lb 12.8 oz (82.5 kg)   Height: 5' 5 (1.651 m)     Physical Exam: The patient is a well-groomed woman sitting in a chair, in no acute distress. Cardiovascular exam reveals a regular rate and rhythm with no murmurs, rubs, or gallops. Pulmonary exam shows normal work of breathing on room air, with lungs clear to auscultation bilaterally. The abdomen is soft, non-tender, and non-distended. Musculoskeletal exam demonstrates normal bulk and tone. Neurologically, she is alert and oriented 3 with no focal deficits. Skin is warm and dry. Psychiatrically, she exhibits normal mood and behavior.  Assessment & Plan:   Stage 3b chronic kidney disease (CKD) (HCC) Ms. Granderson has a history of chronic kidney disease, and her renal function appears to have progressively worsened over the past few months. She was recently hospitalized for hyperkalemia and required a bicarbonate infusion due to an anion gap metabolic acidosis. Although she denies urinary symptoms today, her most recent BMP shows a low bicarbonate level, which is concerning given her trajectory of declining kidney function. She reports having seen a nephrologist after her last hospitalization, but I am unable to locate documentation in the chart, which may be related to our lack of access to [facility] records. Given her continued decline in kidney function, a referral to nephrology is appropriate for  further evaluation and management.In the interim, I will begin bicarbonate supplementation at 650mg  twice daily to address her low bicarbonate level until she is able to follow up with nephrology. Close monitoring is advised, and she was instructed to seek care sooner for worsening symptoms, decreased urine output, or signs of fluid overload. Plan: Nephrology referral: Arrange urgent outpatient follow-up given progressive CKD and recent hospitalization for hyperkalemia/metabolic acidosis. Bicarbonate supplementation: Start oral bicarbonate 650mg  twice daily to correct low serum bicarbonate until nephrology follow-up. Laboratory monitoring: Check BMP, electrolytes, and bicarbonate level in 1-2 weeks or sooner if symptoms worsen. Patient education: Discuss signs of worsening kidney function (fatigue, swelling, decreased urine output, shortness of breath) and instruct to seek care promptly if these occur. Medication review: Ensure avoidance of nephrotoxic medications.  Type 2 diabetes mellitus without complication, without long-term current use of insulin  (HCC) Lab Results  Component Value Date   HGBA1C 6.7 03/02/2024   HGBA1C 6.1 (A) 11/05/2023   HGBA1C 6.4 (H) 08/06/2022  The patient's diabetes is well-controlled. She denies polydipsia, polyuria, or any vision changes. Continue current therapy with Jardiance . Hemoglobin A1c monitoring will be spaced from every 3 months to every 6 months going forward.  Essential hypertension The patient follows up with the hypertension clinic, and I appreciate your collaboration in managing her blood pressure. She appears to have frequent follow-up visits, with the most recent on 02/16/2024.  Encounter for vaccination Patient did get her flu shot today     Patient discussed with Dr. Trudy Drue Grow, M.D [facility] Phone: [phone] Date 03/02/2024 Time 11:10 AM

## 2024-03-02 NOTE — Assessment & Plan Note (Signed)
 The patient follows up with the hypertension clinic, and I appreciate your collaboration in managing her blood pressure. She appears to have frequent follow-up visits, with the most recent on 02/16/2024.

## 2024-03-02 NOTE — Assessment & Plan Note (Signed)
 Ms. Viscomi has a history of chronic kidney disease, and her renal function appears to have progressively worsened over the past few months. She was recently hospitalized for hyperkalemia and required a bicarbonate infusion due to an anion gap metabolic acidosis. Although she denies urinary symptoms today, her most recent BMP shows a low bicarbonate level, which is concerning given her trajectory of declining kidney function. She reports having seen a nephrologist after her last hospitalization, but I am unable to locate documentation in the chart, which may be related to our lack of access to [facility] records. Given her continued decline in kidney function, a referral to nephrology is appropriate for further evaluation and management.In the interim, I will begin bicarbonate supplementation at 650mg  twice daily to address her low bicarbonate level until she is able to follow up with nephrology. Close monitoring is advised, and she was instructed to seek care sooner for worsening symptoms, decreased urine output, or signs of fluid overload. Plan: Nephrology referral: Arrange urgent outpatient follow-up given progressive CKD and recent hospitalization for hyperkalemia/metabolic acidosis. Bicarbonate supplementation: Start oral bicarbonate 650mg  twice daily to correct low serum bicarbonate until nephrology follow-up. Laboratory monitoring: Check BMP, electrolytes, and bicarbonate level in 1-2 weeks or sooner if symptoms worsen. Patient education: Discuss signs of worsening kidney function (fatigue, swelling, decreased urine output, shortness of breath) and instruct to seek care promptly if these occur. Medication review: Ensure avoidance of nephrotoxic medications.

## 2024-03-02 NOTE — Assessment & Plan Note (Signed)
 Patient did get her flu shot today

## 2024-03-02 NOTE — Assessment & Plan Note (Signed)
 Lab Results  Component Value Date   HGBA1C 6.7 03/02/2024   HGBA1C 6.1 (A) 11/05/2023   HGBA1C 6.4 (H) 08/06/2022  The patient's diabetes is well-controlled. She denies polydipsia, polyuria, or any vision changes. Continue current therapy with Jardiance . Hemoglobin A1c monitoring will be spaced from every 3 months to every 6 months going forward.

## 2024-03-08 ENCOUNTER — Other Ambulatory Visit (HOSPITAL_COMMUNITY): Payer: Self-pay

## 2024-03-09 ENCOUNTER — Encounter: Payer: Self-pay | Admitting: Internal Medicine

## 2024-03-10 ENCOUNTER — Encounter: Payer: Self-pay | Admitting: Internal Medicine

## 2024-03-10 ENCOUNTER — Other Ambulatory Visit (HOSPITAL_COMMUNITY): Payer: Self-pay

## 2024-03-10 NOTE — Progress Notes (Signed)
 Internal Medicine Clinic Attending  Case discussed with the resident at the time of the visit.  We reviewed the resident's history and exam and pertinent patient test results.  I agree with the assessment, diagnosis, and plan of care documented in the resident's note.

## 2024-03-29 ENCOUNTER — Other Ambulatory Visit (HOSPITAL_COMMUNITY): Payer: Self-pay

## 2024-03-29 ENCOUNTER — Ambulatory Visit: Attending: Cardiology | Admitting: Pharmacist

## 2024-03-29 VITALS — BP 146/80 | HR 52

## 2024-03-29 DIAGNOSIS — I10 Essential (primary) hypertension: Secondary | ICD-10-CM

## 2024-03-29 NOTE — Progress Notes (Signed)
 Patient ID: Debbie [name]                 DOB: 11-21-1945                      MRN: [id number]      HPI: Debbie [name] is a 78 y.o. female referred by Dr. Estil to HTN clinic. PMH is significant for  CAD s/p DES x 3 to the RCA in 2003, s/p DES to o-pRCA and PTCA-mRCA (ISR) in 07/2021, paroxysmal atrial fibrillation on Eliquis , hypertension, hyperlipidemia, stroke, type 2 diabetes and chronic lower back pain.  Seen 10/27/23 for DOD apt for elevated BP. Olmesartan  was increased to 40mg  daily.  She was also referred to discuss GLP-1.  Patient seen 12/09/23 in PharmD clinic. She showed up at her appointment at the wrong time and we worked her in. Due to time constraints GPP-1 was briefly discussed. Her BP was 132/62. No home readings were available. Complained of back pain/cough with repatha . Advised she could hold 1-2 doses to see if there was improvement.   Seen 01/12/24- BP was 138/70. Homes readings varied and were much higher. Home cuff has not been validated. She was encouraged to rest prior to checking BP.  Spironolactone  25mg  daily was started. She cannot afford GLP-1. She did not see any improvement in pain when holding Repatha . She was asked to resume.   At last visit, patient's home machine was found to be accurate, but patient's technique at home was not good. Reviewed proper technique. BP in clinic was 132/60. Carvedilol  was decreased to 3.125 mg BID due to bradycardia. She was encouraged to exercise.   Patient presents today for follow up. She brings in just a few BP readings 130/80, 132/90, 133/90. She has been taking her Eliquis , carvedilol  and sodium bicarb at 8 AM and noon. Advised these need to be taking 12 hours apart. Walking only 5 min per day. Encouraged her to increase to 10 min per day.    Current HTN meds: carvedilol  3.125 mg twice a day, olmesartan  40mg  daily, amlodipine  10mg  daily, isosorbide  60 mg daily, spironolactone  25mg  daily.  Previously tried: ramipril ,  metoprolol , hctz BP goal: <130/80  Family History:  Family History  Problem Relation Age of Onset   Uterine cancer Mother    Diabetes type II Sister    Heart disease Sister    Colon cancer Neg Hx    Colon polyps Neg Hx    Esophageal cancer Neg Hx    Rectal cancer Neg Hx    Stomach cancer Neg Hx    Breast cancer Neg Hx    Bladder Cancer Neg Hx     Social History: 2 cig a week, no ETOH  Diet: 32 oz sweet tea per day  Exercise:  5 min walk daily  Home BP readings: 130/80, 132/90, 133/90   Wt Readings from Last 3 Encounters:  03/02/24 181 lb 12.8 oz (82.5 kg)  11/05/23 179 lb (81.2 kg)  10/29/23 183 lb (83 kg)   BP Readings from Last 3 Encounters:  03/29/24 (!) 146/80  03/02/24 (!) 143/76  02/16/24 132/60   Pulse Readings from Last 3 Encounters:  03/29/24 (!) 52  03/02/24 (!) 51  02/16/24 (!) 47    Renal function: CrCl cannot be calculated (Patient's most recent lab result is older than the maximum 21 days allowed.).  Past Medical History:  Diagnosis Date   Arthritis    CAD (coronary artery disease) 11/1999  RCA stent X3, patent '06. Nuc low risk 9/13   Cataract    beginning stages   Diabetes mellitus without complication (HCC)    Dysrhythmia    History of stress test 01/08/2012   Showed minimal anterior thinning not felt to be significant.   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     amLODipine   (NORVASC ) 10 MG tablet Take 1 tablet (10 mg total) by mouth daily. 90 tablet 3   apixaban  (ELIQUIS ) 5 MG TABS tablet Take 1 tablet (5 mg total) by mouth 2 (two) times daily. 180 tablet 3   atorvastatin  (LIPITOR ) 80 MG tablet Take 1 tablet (80 mg total) by mouth daily. 90 tablet 3   carvedilol  (COREG ) 3.125 MG tablet Take 1 tablet (3.125 mg total) by mouth 2 (two) times daily. 180 tablet 3   empagliflozin  (JARDIANCE ) 10 MG TABS tablet Take 1 tablet (10 mg total) by mouth daily before breakfast. 90 tablet 3   Evolocumab  (REPATHA  SURECLICK) 140 MG/ML SOAJ Inject 140 mg into the skin every 14 (fourteen) days. 6 mL 3   ezetimibe  (ZETIA ) 10 MG tablet Take 1 tablet (10 mg total) by mouth daily. 90 tablet 3   isosorbide  mononitrate (IMDUR ) 60 MG 24 hr tablet Take 1 tablet (60 mg total) by mouth daily. 90 tablet 3   Multiple Vitamin (MULTIVITAMIN) capsule Take 1 capsule by mouth daily.     olmesartan  (BENICAR ) 40 MG tablet Take 1 tablet (40 mg total) by mouth daily. 90 tablet 3   sodium bicarbonate  650 MG tablet Take 1 tablet (650 mg total) by mouth 2 (two) times daily. 60 tablet 1   spironolactone  (ALDACTONE ) 25 MG tablet Take 1 tablet (25 mg total) by mouth daily. 90 tablet 3   acetaminophen  (TYLENOL ) 500 MG tablet Take 2 tablets (1,000 mg total) by mouth every 8 (eight) hours as needed. (Patient taking differently: Take 1,000 mg by mouth every 8 (eight) hours as needed for moderate pain (pain score 4-6).) 30 tablet 0   empagliflozin  (JARDIANCE ) 10 MG TABS tablet Take 1 tablet (10 mg total) by mouth daily before breakfast. 28 tablet 0   empagliflozin  (JARDIANCE ) 10 MG TABS tablet Take 1 tablet (10 mg total) by mouth daily before breakfast. 28 tablet    estradiol  (ESTRACE ) 0.1 MG/GM vaginal cream Place 0.5 g vaginally 2 (two) times a week. Place 0.5g nightly for two weeks then twice a week after (Patient not taking: Reported on 03/29/2024) 30 g 3   [Paused] furosemide  (LASIX ) 20 MG tablet Take 1 tablet by  mouth once daily (Patient not taking: Reported on 03/29/2024) 90 tablet 3   [Paused] metFORMIN  (GLUCOPHAGE ) 1000 MG tablet Take 1,000 mg by mouth 2 (two) times daily. (Patient not taking: Reported on 03/29/2024)     nitroGLYCERIN  (NITROSTAT ) 0.4 MG SL tablet Place 1 tablet (0.4 mg total) under the tongue  every 5 (five) minutes as needed for chest pain. 25 tablet 3   No current facility-administered medications on file prior to visit.    Allergies  Allergen Reactions   Fish Protein-Containing Drug Products     Blood pressure (!) 146/80, pulse (!) 52.   Assessment/Plan: HYPERTENSION CONTROL Vitals:   03/29/24 0820 03/29/24 1647  BP: (!) 150/72 (!) 146/80    The patient's blood pressure is elevated above target today.  In order to address the patient's elevated BP: Blood pressure will be monitored at home to determine if medication changes need to be made.      1. Hypertension -  Essential hypertension Assessment: BP elevated in clinic Home readings are better, but only 3 readings available Very little exercise Doesn't like taking medications She did bring in her home medications and I did confirm she has been taking what she is supposed to be  Plan: Increase walking to daily Continue  carvedilol  3.125 mg twice a day, olmesartan  40mg  daily, amlodipine  10mg  daily, isosorbide  60 mg daily, spironolactone  25mg  daily.  Check BP 1-2 times per day Call me with readings in 2 weeks, if high, will consider increasing spironolactone  vs adding hydralazine .      Thank you  Nasim Habeeb D Tinzley Dalia, Pharm.Debbie [name], CPP [facility] [address], [place], [place] [address]  Phone: [phone]; Fax: [phone]

## 2024-03-29 NOTE — Assessment & Plan Note (Signed)
 Assessment: BP elevated in clinic Home readings are better, but only 3 readings available Very little exercise Doesn't like taking medications She did bring in her home medications and I did confirm she has been taking what she is supposed to be  Plan: Increase walking to daily Continue  carvedilol  3.125 mg twice a day, olmesartan  40mg  daily, amlodipine  10mg  daily, isosorbide  60 mg daily, spironolactone  25mg  daily.  Check BP 1-2 times per day Call me with readings in 2 weeks, if high, will consider increasing spironolactone  vs adding hydralazine .

## 2024-03-29 NOTE — Patient Instructions (Addendum)
 Please start taking your medications at 8 AM and 8 PM. The medications that you take twice a day should be taken about 12 hours apart  Your blood pressure goal is < 130/79mmHg   Please check blood pressure at home 1-2 times per day.   Continue carvedilol  3.125 mg twice a day, olmesartan  40mg  daily, amlodipine  10mg  daily, isosorbide  60 mg daily, spironolactone  25mg  daily.   Please call me with your blood pressure readings 04/14/24  Please increase your walking to 10 min per day  Important lifestyle changes to control high blood pressure  Intervention  Effect on the BP   Weight loss Weight loss is one of the most effective lifestyle changes for controlling blood pressure. If you're overweight or obese, losing even a small amount of weight can help reduce blood pressure.    Blood pressure can decrease by 1 millimeter of mercury (mmHg) with each kilogram (about 2.2 pounds) of weight lost.   Exercise regularly As a general goal, aim for 30 minutes of moderate physical activity every day.    Regular physical activity can lower blood pressure by 5 - 8 mmHg.   Eat a healthy diet Eat a diet rich in whole grains, fruits, vegetables, lean meat, and low-fat dairy products. Limit processed foods, saturated fat, and sweets.    A heart-healthy diet can lower high blood pressure by 10 mmHg.   Reduce salt (sodium) in your diet Aim for 000mg  of sodium each day. Avoid deli meats, canned food, and frozen microwave meals which are high in sodium.     Limiting sodium can reduce blood pressure by 5 mmHg.   Limit alcohol One drink equals 12 ounces of beer, 5 ounces of wine, or 1.5 ounces of 80-proof liquor.    Limiting alcohol to < 1 drink a day for women or < 2 drinks a day for men can help lower blood pressure by about 4 mmHg.   To check your pressure at home you will need to:   Sit up in a chair, with feet flat on the floor and back supported. Do not cross your ankles or legs. Rest your  left arm so that the cuff is about heart level. If the cuff goes on your upper arm, then just relax your arm on the table, arm of the chair, or your lap. If you have a wrist cuff, hold your wrist against your chest at heart level. Place the cuff snugly around your arm, about 1 inch above the crease of your elbow. The cords should be inside the groove of your elbow.  Sit quietly, with the cuff in place, for about 5 minutes. Then press the power button to start a reading. Do not talk or move while the reading is taking place.  Record your readings on a sheet of paper. Although most cuffs have a memory, it is often easier to see a pattern developing when the numbers are all in front of you.  You can repeat the reading after 1-3 minutes if it is recommended.   Make sure your bladder is empty and you have not had caffeine or tobacco within the last 30 minutes   Always bring your blood pressure log with you to your appointments. If you have not brought your monitor in to be double checked for accuracy, please bring it to your next appointment.   You can find a list of validated (accurate) blood pressure cuffs at: validatebp.org

## 2024-03-31 ENCOUNTER — Ambulatory Visit: Admitting: Gastroenterology

## 2024-03-31 ENCOUNTER — Encounter: Payer: Self-pay | Admitting: Gastroenterology

## 2024-03-31 VITALS — BP 90/62 | HR 61 | Ht 63.25 in | Wt 184.5 lb

## 2024-03-31 DIAGNOSIS — I2510 Atherosclerotic heart disease of native coronary artery without angina pectoris: Secondary | ICD-10-CM | POA: Diagnosis not present

## 2024-03-31 DIAGNOSIS — Z955 Presence of coronary angioplasty implant and graft: Secondary | ICD-10-CM | POA: Diagnosis not present

## 2024-03-31 DIAGNOSIS — Z860100 Personal history of colon polyps, unspecified: Secondary | ICD-10-CM

## 2024-03-31 DIAGNOSIS — Z87891 Personal history of nicotine dependence: Secondary | ICD-10-CM

## 2024-03-31 DIAGNOSIS — Z860101 Personal history of adenomatous and serrated colon polyps: Secondary | ICD-10-CM | POA: Diagnosis not present

## 2024-03-31 NOTE — Patient Instructions (Signed)
 Follow up as needed.   _______________________________________________________  If your blood pressure at your visit was 140/90 or greater, please contact your primary care physician to follow up on this.  _______________________________________________________  If you are age 79 or older, your body mass index should be between 23-30. Your Body mass index is 32.42 kg/m. If this is out of the aforementioned range listed, please consider follow up with your Primary Care Provider.  If you are age 58 or younger, your body mass index should be between 19-25. Your Body mass index is 32.42 kg/m. If this is out of the aformentioned range listed, please consider follow up with your Primary Care Provider.   ________________________________________________________  The [facility] providers would like to encourage you to use MYCHART to communicate with providers for non-urgent requests or questions.  Due to long hold times on the telephone, sending your provider a message by [facility] may be a faster and more efficient way to get a response.  Please allow 48 business hours for a response.  Please remember that this is for non-urgent requests.  _______________________________________________________  [facility] is using a team-based approach to care.  Your team is made up of your doctor and two to three APPS. Our APPS (Nurse Practitioners and Physician Assistants) work with your physician to ensure care continuity for you. They are fully qualified to address your health concerns and develop a treatment plan. They communicate directly with your gastroenterologist to care for you. Seeing the Advanced Practice Practitioners on your physician's team can help you by facilitating care more promptly, often allowing for earlier appointments, access to diagnostic testing, procedures, and other specialty referrals.

## 2024-03-31 NOTE — Progress Notes (Unsigned)
 HPI :       Past Medical History:  Diagnosis Date   Arthritis    CAD (coronary artery disease) 11/1999   RCA stent X3, patent '06. Nuc low risk 9/13   Cataract    beginning stages   Diabetes mellitus without complication (HCC)    Dysrhythmia    Hemorrhoids    History of stress test 01/08/2012   Showed minimal anterior thinning not felt to be significant.   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/2021   Stroke (HCC) 1998   Vitamin D deficiency      Past Surgical History:  Procedure Laterality Date   ABDOMINAL HYSTERECTOMY  2021   CATARACT EXTRACTION, BILATERAL     CHOLECYSTECTOMY     COLONOSCOPY     CORONARY ANGIOGRAM  08/2004   patent stents   CORONARY ANGIOPLASTY WITH STENT PLACEMENT  11/1999   X 3   CORONARY STENT INTERVENTION N/A 07/31/2021   Procedure: CORONARY STENT INTERVENTION;  Surgeon: Burnard Debby LABOR, MD;  Location: [facility];  Service: Cardiovascular;  Laterality: N/A;   EYE SURGERY     HAMMER TOE SURGERY Left    L 2nd toe   LEFT HEART CATH AND CORONARY ANGIOGRAPHY N/A 07/31/2021   Procedure: LEFT HEART CATH AND CORONARY ANGIOGRAPHY;  Surgeon: Burnard Debby LABOR, MD;  Location: [facility]VAS[facility] CV [facility];  Service: Cardiovascular;  Laterality: N/A;   MULTIPLE TOOTH EXTRACTIONS     ROBOTIC ASSISTED TOTAL HYSTERECTOMY WITH BILATERAL SALPINGO OOPHERECTOMY Bilateral 03/29/2020   Procedure: XI ROBOTIC ASSISTED TOTAL HYSTERECTOMY WITH BILATERAL SALPINGO OOPHORECTOMY;  Surgeon: Rosalva Sawyer, MD;  Location: [facility];  Service: Gynecology;  Laterality: Bilateral;  Tracie to RNFA confirmed on 02/16/20 CS   Family History  Problem Relation Age of Onset   Uterine cancer Mother    Diabetes type II Sister     Heart disease Sister    Colon cancer Neg Hx    Colon polyps Neg Hx    Esophageal cancer Neg Hx    Rectal cancer Neg Hx    Stomach cancer Neg Hx    Breast cancer Neg Hx    Bladder Cancer Neg Hx    Social History[1] Current Outpatient Medications  Medication Sig Dispense Refill   acetaminophen  (TYLENOL ) 500 MG tablet Take 2 tablets (1,000 mg total) by mouth every 8 (eight) hours as needed. (Patient taking differently: Take 1,000 mg by mouth as needed for moderate pain (pain score 4-6).) 30 tablet 0   albuterol  (VENTOLIN  HFA) 108 (90 Base) MCG/ACT inhaler Inhale 2 puffs into the lungs every 6 (six) hours as needed for shortness of breath or wheezing.     amLODipine  (NORVASC ) 10 MG tablet Take 1 tablet (10 mg total) by mouth daily. 90 tablet 3   apixaban  (ELIQUIS ) 5 MG TABS tablet Take 1 tablet (5 mg total) by mouth 2 (two) times daily. 180 tablet 3   atorvastatin  (LIPITOR ) 80 MG tablet Take 1 tablet (80 mg total) by mouth daily. 90 tablet 3   carvedilol  (COREG ) 3.125 MG tablet Take 1 tablet (3.125 mg total) by mouth 2 (two) times daily. 180 tablet 3   empagliflozin  (JARDIANCE ) 10 MG TABS  tablet Take 1 tablet (10 mg total) by mouth daily before breakfast. 90 tablet 3   empagliflozin  (JARDIANCE ) 10 MG TABS tablet Take 1 tablet (10 mg total) by mouth daily before breakfast. 28 tablet 0   empagliflozin  (JARDIANCE ) 10 MG TABS tablet Take 1 tablet (10 mg total) by mouth daily before breakfast. 28 tablet    Evolocumab  (REPATHA  SURECLICK) 140 MG/ML SOAJ Inject 140 mg into the skin every 14 (fourteen) days. 6 mL 3   ezetimibe  (ZETIA ) 10 MG tablet Take 1 tablet (10 mg total) by mouth daily. 90 tablet 3   isosorbide  mononitrate (IMDUR ) 60 MG 24 hr tablet Take 1 tablet (60 mg total) by mouth daily. 90 tablet 3   [Paused] metFORMIN  (GLUCOPHAGE ) 1000 MG tablet Take 1,000 mg by mouth 2 (two) times daily.     Multiple Vitamin (MULTIVITAMIN) capsule Take 1 capsule by mouth daily.     nitroGLYCERIN  (NITROSTAT )  0.4 MG SL tablet Place 1 tablet (0.4 mg total) under the tongue every 5 (five) minutes as needed for chest pain. 25 tablet 3   olmesartan  (BENICAR ) 40 MG tablet Take 1 tablet (40 mg total) by mouth daily. 90 tablet 3   sodium bicarbonate  650 MG tablet Take 1 tablet (650 mg total) by mouth 2 (two) times daily. 60 tablet 1   spironolactone  (ALDACTONE ) 25 MG tablet Take 1 tablet (25 mg total) by mouth daily. 90 tablet 3   estradiol  (ESTRACE ) 0.1 MG/GM vaginal cream Place 0.5 g vaginally 2 (two) times a week. Place 0.5g nightly for two weeks then twice a week after (Patient not taking: Reported on 03/29/2024) 30 g 3   [Paused] furosemide  (LASIX ) 20 MG tablet Take 1 tablet by mouth once daily (Patient not taking: Reported on 03/29/2024) 90 tablet 3   No current facility-administered medications for this visit.   Allergies[2]   Review of Systems: All systems reviewed and negative except where noted in HPI.    No results found.  Physical Exam: BP 90/62 (BP Location: Right Arm, Patient Position: Sitting, Cuff Size: Normal)   Pulse 61   Ht 5' 3.25 (1.607 m)   Wt 184 lb 8 oz (83.7 kg)   BMI 32.42 kg/m  Constitutional: Pleasant,well-developed, ***female in no acute distress. HEENT: Normocephalic and atraumatic. Conjunctivae are normal. No scleral icterus. Neck supple.  Cardiovascular: Normal rate, regular rhythm.  Pulmonary/chest: Effort normal and breath sounds normal. No wheezing, rales or rhonchi. Abdominal: Soft, nondistended, nontender. Bowel sounds active throughout. There are no masses palpable. No hepatomegaly. Extremities: no edema Lymphadenopathy: No cervical adenopathy noted. Neurological: Alert and oriented to person place and time. Skin: Skin is warm and dry. No rashes noted. Psychiatric: Normal mood and affect. Behavior is normal.  CBC    Component Value Date/Time   WBC 10.4 09/16/2023 0918   RBC 3.65 (L) 09/16/2023 0918   HGB 11.4 (L) 09/16/2023 0918   HGB 12.2  06/10/2023 0954   HCT 35.8 (L) 09/16/2023 0918   HCT 36.5 06/10/2023 0954   PLT 306 09/16/2023 0918   PLT 382 06/10/2023 0954   MCV 98.1 09/16/2023 0918   MCV 91 06/10/2023 0954   MCH 31.2 09/16/2023 0918   MCHC 31.8 09/16/2023 0918   RDW 12.7 09/16/2023 0918   RDW 14.6 06/10/2023 0954   LYMPHSABS 2.1 09/16/2023 0918   MONOABS 0.4 09/16/2023 0918   EOSABS 0.1 09/16/2023 0918   BASOSABS 0.0 09/16/2023 0918    CMP     Component Value Date/Time   NA  141 02/16/2024 1020   K 4.2 02/16/2024 1020   CL 110 (H) 02/16/2024 1020   CO2 15 (L) 02/16/2024 1020   GLUCOSE 116 (H) 02/16/2024 1020   GLUCOSE 98 09/18/2023 0550   BUN 24 02/16/2024 1020   CREATININE 1.65 (H) 02/16/2024 1020   CREATININE 1.14 (H) 01/18/2016 0823   CALCIUM  9.9 02/16/2024 1020   PROT 6.3 10/01/2023 1041   ALBUMIN  4.2 10/01/2023 1041   AST 19 10/01/2023 1041   ALT 23 10/01/2023 1041   ALKPHOS 72 10/01/2023 1041   BILITOT 0.5 10/01/2023 1041   GFRNONAA 26 (L) 09/18/2023 0550   GFRAA 66 02/16/2020 0839       Latest Ref Rng & Units 09/16/2023    9:18 AM 06/10/2023    9:54 AM 05/20/2023   10:39 PM  CBC EXTENDED  WBC 4.0 - 10.5 K/uL 10.4  9.3  9.3   RBC 3.87 - 5.11 MIL/uL 3.65  4.01  3.76   Hemoglobin 12.0 - 15.0 g/dL 88.5  87.7  88.9   HCT 36.0 - 46.0 % 35.8  36.5  33.6   Platelets 150 - 400 K/uL 306  382  292   NEUT# 1.7 - 7.7 K/uL 7.8     Lymph# 0.7 - 4.0 K/uL 2.1         ASSESSMENT AND PLAN:  Renne Homans, MD     [1]  Social History Tobacco Use   Smoking status: Former    Current packs/day: 0.00    Types: Cigarettes    Quit date: 2024    Years since quitting: 1.9   Smokeless tobacco: Never   Tobacco comments:    10/29/23: Patient stated that she quit smoking 2024.  Vaping Use   Vaping status: Never Used  Substance Use Topics   Alcohol use: Never   Drug use: No  [2]  Allergies Allergen Reactions   Fish Protein-Containing Drug Products

## 2024-04-05 ENCOUNTER — Other Ambulatory Visit (HOSPITAL_COMMUNITY): Payer: Self-pay

## 2024-04-10 ENCOUNTER — Other Ambulatory Visit (HOSPITAL_COMMUNITY): Payer: Self-pay

## 2024-04-13 ENCOUNTER — Other Ambulatory Visit (HOSPITAL_COMMUNITY): Payer: Self-pay

## 2024-04-27 ENCOUNTER — Other Ambulatory Visit (HOSPITAL_COMMUNITY): Payer: Self-pay

## 2024-04-29 ENCOUNTER — Other Ambulatory Visit (HOSPITAL_COMMUNITY): Payer: Self-pay

## 2024-04-29 ENCOUNTER — Other Ambulatory Visit: Payer: Self-pay

## 2024-04-29 ENCOUNTER — Other Ambulatory Visit: Payer: Self-pay | Admitting: Student

## 2024-04-29 ENCOUNTER — Telehealth: Payer: Self-pay | Admitting: Pharmacist

## 2024-04-29 DIAGNOSIS — N1831 Chronic kidney disease, stage 3a: Secondary | ICD-10-CM

## 2024-04-29 NOTE — Telephone Encounter (Signed)
 Patient had called and LVM with BP reading. The only reading she gave me was 151-95 with repeat reading of 130/61. She reports its been in the 130's. I asked her to write her readings down and call me again in another week or two. Encouraged her to walk to the mailbox and back a few times a day.

## 2024-04-30 ENCOUNTER — Other Ambulatory Visit: Payer: Self-pay

## 2024-04-30 ENCOUNTER — Other Ambulatory Visit (HOSPITAL_COMMUNITY): Payer: Self-pay

## 2024-04-30 MED FILL — Sodium Bicarbonate Tab 650 MG: ORAL | 30 days supply | Qty: 60 | Fill #0 | Status: AC

## 2024-05-03 ENCOUNTER — Other Ambulatory Visit: Payer: Self-pay | Admitting: Student

## 2024-05-03 ENCOUNTER — Other Ambulatory Visit (HOSPITAL_COMMUNITY): Payer: Self-pay

## 2024-05-03 DIAGNOSIS — N1831 Chronic kidney disease, stage 3a: Secondary | ICD-10-CM

## 2024-05-04 ENCOUNTER — Other Ambulatory Visit (HOSPITAL_COMMUNITY): Payer: Self-pay

## 2024-05-04 ENCOUNTER — Encounter (HOSPITAL_COMMUNITY): Payer: Self-pay

## 2024-05-13 ENCOUNTER — Other Ambulatory Visit (HOSPITAL_COMMUNITY): Payer: Self-pay

## 2024-05-13 MED ORDER — CEPHALEXIN 250 MG PO CAPS
250.0000 mg | ORAL_CAPSULE | Freq: Two times a day (BID) | ORAL | 0 refills | Status: AC
  Filled 2024-05-13: qty 6, 3d supply, fill #0

## 2024-05-15 ENCOUNTER — Other Ambulatory Visit (HOSPITAL_COMMUNITY): Payer: Self-pay

## 2024-05-16 ENCOUNTER — Other Ambulatory Visit (HOSPITAL_COMMUNITY): Payer: Self-pay

## 2024-05-16 ENCOUNTER — Other Ambulatory Visit: Payer: Self-pay

## 2024-05-19 ENCOUNTER — Other Ambulatory Visit (HOSPITAL_COMMUNITY): Payer: Self-pay

## 2024-11-03 ENCOUNTER — Ambulatory Visit
